# Patient Record
Sex: Female | Born: 1950 | Race: Black or African American | Hispanic: No | Smoking: Former smoker
Health system: Southern US, Community
[De-identification: ages and names within clinical notes are randomized; demographics above are authoritative.]

## PROBLEM LIST (undated history)

## (undated) DIAGNOSIS — E785 Hyperlipidemia, unspecified: Secondary | ICD-10-CM

## (undated) DIAGNOSIS — I10 Essential (primary) hypertension: Secondary | ICD-10-CM

## (undated) DIAGNOSIS — R7302 Impaired glucose tolerance (oral): Secondary | ICD-10-CM

## (undated) DIAGNOSIS — I722 Aneurysm of renal artery: Secondary | ICD-10-CM

## (undated) DIAGNOSIS — T7840XA Allergy, unspecified, initial encounter: Secondary | ICD-10-CM

## (undated) DIAGNOSIS — K5731 Diverticulosis of large intestine without perforation or abscess with bleeding: Secondary | ICD-10-CM

## (undated) DIAGNOSIS — O2493 Unspecified diabetes mellitus in the puerperium: Secondary | ICD-10-CM

## (undated) DIAGNOSIS — K439 Ventral hernia without obstruction or gangrene: Secondary | ICD-10-CM

## (undated) DIAGNOSIS — F329 Major depressive disorder, single episode, unspecified: Secondary | ICD-10-CM

## (undated) DIAGNOSIS — J449 Chronic obstructive pulmonary disease, unspecified: Secondary | ICD-10-CM

## (undated) DIAGNOSIS — D571 Sickle-cell disease without crisis: Secondary | ICD-10-CM

## (undated) DIAGNOSIS — E1165 Type 2 diabetes mellitus with hyperglycemia: Secondary | ICD-10-CM

## (undated) DIAGNOSIS — I2510 Atherosclerotic heart disease of native coronary artery without angina pectoris: Secondary | ICD-10-CM

## (undated) DIAGNOSIS — T783XXA Angioneurotic edema, initial encounter: Secondary | ICD-10-CM

## (undated) DIAGNOSIS — J45909 Unspecified asthma, uncomplicated: Secondary | ICD-10-CM

## (undated) DIAGNOSIS — E669 Obesity, unspecified: Secondary | ICD-10-CM

## (undated) DIAGNOSIS — I447 Left bundle-branch block, unspecified: Secondary | ICD-10-CM

## (undated) DIAGNOSIS — I442 Atrioventricular block, complete: Secondary | ICD-10-CM

## (undated) DIAGNOSIS — I5032 Chronic diastolic (congestive) heart failure: Secondary | ICD-10-CM

## (undated) DIAGNOSIS — F411 Generalized anxiety disorder: Secondary | ICD-10-CM

## (undated) HISTORY — DX: Atherosclerotic heart disease of native coronary artery without angina pectoris: I25.10

## (undated) HISTORY — DX: Generalized anxiety disorder: F41.1

## (undated) HISTORY — DX: Diverticulosis of large intestine without perforation or abscess with bleeding: K57.31

## (undated) HISTORY — DX: Ventral hernia without obstruction or gangrene: K43.9

## (undated) HISTORY — DX: Sickle-cell disease without crisis: D57.1

## (undated) HISTORY — DX: Aneurysm of renal artery: I72.2

## (undated) HISTORY — DX: Unspecified asthma, uncomplicated: J45.909

## (undated) HISTORY — DX: Impaired glucose tolerance (oral): R73.02

## (undated) HISTORY — DX: Obesity, unspecified: E66.9

## (undated) HISTORY — PX: CARDIAC SURGERY: SHX584

## (undated) HISTORY — DX: Left bundle-branch block, unspecified: I44.7

## (undated) HISTORY — DX: Angioneurotic edema, initial encounter: T78.3XXA

## (undated) HISTORY — DX: Hyperlipidemia, unspecified: E78.5

## (undated) HISTORY — DX: Chronic obstructive pulmonary disease, unspecified: J44.9

## (undated) HISTORY — DX: Allergy, unspecified, initial encounter: T78.40XA

## (undated) HISTORY — DX: Unspecified diabetes mellitus in the puerperium: O24.93

## (undated) HISTORY — PX: MYOMECTOMY: SHX85

## (undated) HISTORY — DX: Essential (primary) hypertension: I10

## (undated) HISTORY — DX: Major depressive disorder, single episode, unspecified: F32.9

## (undated) HISTORY — DX: Type 2 diabetes mellitus with hyperglycemia: E11.65

---

## 1985-01-24 HISTORY — PX: ABDOMINAL HYSTERECTOMY: SHX81

## 2001-01-13 ENCOUNTER — Encounter: Payer: Self-pay | Admitting: Emergency Medicine

## 2001-01-13 ENCOUNTER — Inpatient Hospital Stay (HOSPITAL_COMMUNITY): Admission: EM | Admit: 2001-01-13 | Discharge: 2001-01-15 | Payer: Self-pay | Admitting: Emergency Medicine

## 2001-01-14 ENCOUNTER — Encounter: Payer: Self-pay | Admitting: Internal Medicine

## 2003-03-30 ENCOUNTER — Emergency Department (HOSPITAL_COMMUNITY): Admission: EM | Admit: 2003-03-30 | Discharge: 2003-03-30 | Payer: Self-pay | Admitting: Emergency Medicine

## 2003-09-30 ENCOUNTER — Emergency Department (HOSPITAL_COMMUNITY): Admission: EM | Admit: 2003-09-30 | Discharge: 2003-10-01 | Payer: Self-pay | Admitting: Emergency Medicine

## 2004-02-06 ENCOUNTER — Ambulatory Visit: Payer: Self-pay | Admitting: Internal Medicine

## 2004-07-05 ENCOUNTER — Ambulatory Visit: Payer: Self-pay | Admitting: Internal Medicine

## 2004-07-06 ENCOUNTER — Ambulatory Visit: Payer: Self-pay | Admitting: Internal Medicine

## 2004-07-22 ENCOUNTER — Ambulatory Visit: Payer: Self-pay | Admitting: Family Medicine

## 2004-07-29 ENCOUNTER — Encounter: Admission: RE | Admit: 2004-07-29 | Discharge: 2004-07-29 | Payer: Self-pay | Admitting: Internal Medicine

## 2004-08-02 ENCOUNTER — Ambulatory Visit: Payer: Self-pay

## 2005-03-09 ENCOUNTER — Emergency Department (HOSPITAL_COMMUNITY): Admission: EM | Admit: 2005-03-09 | Discharge: 2005-03-09 | Payer: Self-pay | Admitting: Emergency Medicine

## 2005-05-27 ENCOUNTER — Ambulatory Visit: Payer: Self-pay | Admitting: Internal Medicine

## 2005-09-28 ENCOUNTER — Ambulatory Visit: Payer: Self-pay | Admitting: Internal Medicine

## 2005-09-29 ENCOUNTER — Ambulatory Visit: Payer: Self-pay | Admitting: Internal Medicine

## 2006-01-18 ENCOUNTER — Ambulatory Visit: Payer: Self-pay | Admitting: Internal Medicine

## 2006-02-25 ENCOUNTER — Encounter: Admission: RE | Admit: 2006-02-25 | Discharge: 2006-02-25 | Payer: Self-pay | Admitting: General Surgery

## 2006-03-22 ENCOUNTER — Ambulatory Visit: Payer: Self-pay | Admitting: Internal Medicine

## 2006-03-22 LAB — CONVERTED CEMR LAB
BUN: 8 mg/dL (ref 6–23)
CO2: 30 meq/L (ref 19–32)
Calcium: 9.6 mg/dL (ref 8.4–10.5)
Chloride: 103 meq/L (ref 96–112)
Cholesterol: 213 mg/dL (ref 0–200)
Creatinine, Ser: 0.6 mg/dL (ref 0.4–1.2)
Direct LDL: 112.4 mg/dL
GFR calc Af Amer: 134 mL/min
GFR calc non Af Amer: 111 mL/min
Glucose, Bld: 173 mg/dL — ABNORMAL HIGH (ref 70–99)
HDL: 39.3 mg/dL (ref >39.0)
Hgb A1c MFr Bld: 6.2 % — ABNORMAL HIGH (ref 4.6–6.0)
Potassium: 3.5 meq/L (ref 3.5–5.1)
Sodium: 141 meq/L (ref 135–145)
Total CHOL/HDL Ratio: 5.4
Triglycerides: 474 mg/dL (ref 0–149)
VLDL: 95 mg/dL — ABNORMAL HIGH (ref 0–40)

## 2006-03-30 ENCOUNTER — Ambulatory Visit: Payer: Self-pay | Admitting: Cardiology

## 2006-04-12 ENCOUNTER — Emergency Department (HOSPITAL_COMMUNITY): Admission: EM | Admit: 2006-04-12 | Discharge: 2006-04-12 | Payer: Self-pay | Admitting: Emergency Medicine

## 2006-05-16 ENCOUNTER — Encounter: Payer: Self-pay | Admitting: Cardiology

## 2006-05-16 ENCOUNTER — Ambulatory Visit: Payer: Self-pay

## 2006-06-25 HISTORY — PX: VENTRAL HERNIA REPAIR: SHX424

## 2006-07-12 ENCOUNTER — Ambulatory Visit (HOSPITAL_COMMUNITY): Admission: RE | Admit: 2006-07-12 | Discharge: 2006-07-12 | Payer: Self-pay | Admitting: General Surgery

## 2006-08-01 DIAGNOSIS — D571 Sickle-cell disease without crisis: Secondary | ICD-10-CM

## 2006-08-01 DIAGNOSIS — I10 Essential (primary) hypertension: Secondary | ICD-10-CM

## 2006-08-01 DIAGNOSIS — J45909 Unspecified asthma, uncomplicated: Secondary | ICD-10-CM | POA: Insufficient documentation

## 2006-08-01 DIAGNOSIS — E785 Hyperlipidemia, unspecified: Secondary | ICD-10-CM

## 2006-08-01 DIAGNOSIS — I421 Obstructive hypertrophic cardiomyopathy: Secondary | ICD-10-CM | POA: Insufficient documentation

## 2006-08-01 HISTORY — DX: Hyperlipidemia, unspecified: E78.5

## 2006-08-01 HISTORY — DX: Sickle-cell disease without crisis: D57.1

## 2006-08-01 HISTORY — DX: Unspecified asthma, uncomplicated: J45.909

## 2006-08-01 HISTORY — DX: Essential (primary) hypertension: I10

## 2006-09-16 DIAGNOSIS — F3289 Other specified depressive episodes: Secondary | ICD-10-CM | POA: Insufficient documentation

## 2006-09-16 DIAGNOSIS — F411 Generalized anxiety disorder: Secondary | ICD-10-CM | POA: Insufficient documentation

## 2006-09-16 DIAGNOSIS — E669 Obesity, unspecified: Secondary | ICD-10-CM | POA: Insufficient documentation

## 2006-09-16 DIAGNOSIS — F329 Major depressive disorder, single episode, unspecified: Secondary | ICD-10-CM

## 2006-09-16 HISTORY — DX: Obesity, unspecified: E66.9

## 2006-09-16 HISTORY — DX: Other specified depressive episodes: F32.89

## 2006-09-16 HISTORY — DX: Generalized anxiety disorder: F41.1

## 2006-09-16 HISTORY — DX: Major depressive disorder, single episode, unspecified: F32.9

## 2006-12-06 ENCOUNTER — Telehealth (INDEPENDENT_AMBULATORY_CARE_PROVIDER_SITE_OTHER): Payer: Self-pay | Admitting: *Deleted

## 2006-12-07 ENCOUNTER — Ambulatory Visit: Payer: Self-pay | Admitting: Internal Medicine

## 2006-12-07 DIAGNOSIS — Z860100 Personal history of colon polyps, unspecified: Secondary | ICD-10-CM | POA: Insufficient documentation

## 2006-12-07 DIAGNOSIS — J019 Acute sinusitis, unspecified: Secondary | ICD-10-CM | POA: Insufficient documentation

## 2006-12-07 DIAGNOSIS — K439 Ventral hernia without obstruction or gangrene: Secondary | ICD-10-CM

## 2006-12-07 DIAGNOSIS — J309 Allergic rhinitis, unspecified: Secondary | ICD-10-CM | POA: Insufficient documentation

## 2006-12-07 HISTORY — DX: Ventral hernia without obstruction or gangrene: K43.9

## 2007-01-10 ENCOUNTER — Telehealth (INDEPENDENT_AMBULATORY_CARE_PROVIDER_SITE_OTHER): Payer: Self-pay | Admitting: *Deleted

## 2007-01-11 ENCOUNTER — Ambulatory Visit: Payer: Self-pay | Admitting: Internal Medicine

## 2007-01-11 DIAGNOSIS — B370 Candidal stomatitis: Secondary | ICD-10-CM | POA: Insufficient documentation

## 2007-01-19 ENCOUNTER — Encounter: Payer: Self-pay | Admitting: Internal Medicine

## 2007-01-19 ENCOUNTER — Ambulatory Visit: Payer: Self-pay | Admitting: Internal Medicine

## 2007-02-20 ENCOUNTER — Ambulatory Visit: Payer: Self-pay | Admitting: Internal Medicine

## 2007-02-20 ENCOUNTER — Encounter: Payer: Self-pay | Admitting: Internal Medicine

## 2007-02-20 LAB — HM COLONOSCOPY

## 2007-03-12 ENCOUNTER — Encounter: Payer: Self-pay | Admitting: Internal Medicine

## 2007-10-08 ENCOUNTER — Ambulatory Visit: Payer: Self-pay | Admitting: Cardiology

## 2007-11-12 ENCOUNTER — Telehealth: Payer: Self-pay | Admitting: Internal Medicine

## 2007-11-12 ENCOUNTER — Ambulatory Visit: Payer: Self-pay | Admitting: Internal Medicine

## 2007-11-12 ENCOUNTER — Telehealth (INDEPENDENT_AMBULATORY_CARE_PROVIDER_SITE_OTHER): Payer: Self-pay | Admitting: *Deleted

## 2007-11-26 ENCOUNTER — Telehealth (INDEPENDENT_AMBULATORY_CARE_PROVIDER_SITE_OTHER): Payer: Self-pay | Admitting: *Deleted

## 2007-12-10 ENCOUNTER — Telehealth (INDEPENDENT_AMBULATORY_CARE_PROVIDER_SITE_OTHER): Payer: Self-pay | Admitting: *Deleted

## 2007-12-10 DIAGNOSIS — R519 Headache, unspecified: Secondary | ICD-10-CM | POA: Insufficient documentation

## 2007-12-10 DIAGNOSIS — R51 Headache: Secondary | ICD-10-CM | POA: Insufficient documentation

## 2007-12-18 ENCOUNTER — Encounter: Payer: Self-pay | Admitting: Internal Medicine

## 2008-02-06 ENCOUNTER — Encounter: Payer: Self-pay | Admitting: Pulmonary Disease

## 2008-03-01 ENCOUNTER — Emergency Department (HOSPITAL_COMMUNITY): Admission: EM | Admit: 2008-03-01 | Discharge: 2008-03-02 | Payer: Self-pay | Admitting: Emergency Medicine

## 2008-03-05 ENCOUNTER — Telehealth (INDEPENDENT_AMBULATORY_CARE_PROVIDER_SITE_OTHER): Payer: Self-pay | Admitting: *Deleted

## 2008-03-07 ENCOUNTER — Ambulatory Visit: Payer: Self-pay | Admitting: Internal Medicine

## 2008-03-07 ENCOUNTER — Encounter: Payer: Self-pay | Admitting: Pulmonary Disease

## 2008-03-07 DIAGNOSIS — T783XXA Angioneurotic edema, initial encounter: Secondary | ICD-10-CM

## 2008-03-07 HISTORY — DX: Angioneurotic edema, initial encounter: T78.3XXA

## 2008-03-11 ENCOUNTER — Ambulatory Visit: Payer: Self-pay | Admitting: Pulmonary Disease

## 2008-03-11 DIAGNOSIS — R0902 Hypoxemia: Secondary | ICD-10-CM | POA: Insufficient documentation

## 2008-03-13 ENCOUNTER — Telehealth (INDEPENDENT_AMBULATORY_CARE_PROVIDER_SITE_OTHER): Payer: Self-pay | Admitting: *Deleted

## 2008-03-14 ENCOUNTER — Telehealth (INDEPENDENT_AMBULATORY_CARE_PROVIDER_SITE_OTHER): Payer: Self-pay | Admitting: *Deleted

## 2008-03-20 ENCOUNTER — Encounter: Payer: Self-pay | Admitting: Pulmonary Disease

## 2008-05-17 ENCOUNTER — Encounter: Payer: Self-pay | Admitting: Pulmonary Disease

## 2008-07-18 ENCOUNTER — Ambulatory Visit: Payer: Self-pay | Admitting: Endocrinology

## 2008-10-14 ENCOUNTER — Encounter: Payer: Self-pay | Admitting: Internal Medicine

## 2008-12-10 ENCOUNTER — Ambulatory Visit: Payer: Self-pay | Admitting: Internal Medicine

## 2008-12-10 DIAGNOSIS — M25519 Pain in unspecified shoulder: Secondary | ICD-10-CM | POA: Insufficient documentation

## 2008-12-23 ENCOUNTER — Telehealth: Payer: Self-pay | Admitting: Internal Medicine

## 2008-12-25 ENCOUNTER — Encounter: Payer: Self-pay | Admitting: Internal Medicine

## 2009-01-15 ENCOUNTER — Telehealth: Payer: Self-pay | Admitting: Internal Medicine

## 2009-08-31 ENCOUNTER — Ambulatory Visit: Payer: Self-pay | Admitting: Internal Medicine

## 2009-08-31 DIAGNOSIS — T2200XA Burn of unspecified degree of shoulder and upper limb, except wrist and hand, unspecified site, initial encounter: Secondary | ICD-10-CM | POA: Insufficient documentation

## 2009-09-02 LAB — CONVERTED CEMR LAB
ALT: 39 units/L — ABNORMAL HIGH (ref 0–35)
AST: 22 units/L (ref 0–37)
Albumin: 4.1 g/dL (ref 3.5–5.2)
Alkaline Phosphatase: 89 units/L (ref 39–117)
BUN: 15 mg/dL (ref 6–23)
Basophils Absolute: 0 10*3/uL (ref 0.0–0.1)
Basophils Relative: 0.6 % (ref 0.0–3.0)
Bilirubin Urine: NEGATIVE
Bilirubin, Direct: 0.1 mg/dL (ref 0.0–0.3)
CO2: 28 meq/L (ref 19–32)
Calcium: 9.2 mg/dL (ref 8.4–10.5)
Chloride: 110 meq/L (ref 96–112)
Cholesterol: 192 mg/dL (ref 0–200)
Creatinine, Ser: 0.9 mg/dL (ref 0.4–1.2)
Direct LDL: 118.5 mg/dL
Eosinophils Absolute: 0.2 10*3/uL (ref 0.0–0.7)
Eosinophils Relative: 3.1 % (ref 0.0–5.0)
Folate: 4.8 ng/mL
GFR calc non Af Amer: 88.32 mL/min (ref >60)
Glucose, Bld: 83 mg/dL (ref 70–99)
HCT: 35.4 % — ABNORMAL LOW (ref 36.0–46.0)
HDL: 37.4 mg/dL — ABNORMAL LOW (ref >39.00)
Hemoglobin, Urine: NEGATIVE
Hemoglobin: 11.8 g/dL — ABNORMAL LOW (ref 12.0–15.0)
Iron: 50 ug/dL (ref 42–145)
Ketones, ur: NEGATIVE mg/dL
Leukocytes, UA: NEGATIVE
Lymphocytes Relative: 26.9 % (ref 12.0–46.0)
Lymphs Abs: 1.8 10*3/uL (ref 0.7–4.0)
MCHC: 33.3 g/dL (ref 30.0–36.0)
MCV: 77.1 fL — ABNORMAL LOW (ref 78.0–100.0)
Monocytes Absolute: 0.4 10*3/uL (ref 0.1–1.0)
Monocytes Relative: 5.8 % (ref 3.0–12.0)
Neutro Abs: 4.3 10*3/uL (ref 1.4–7.7)
Neutrophils Relative %: 63.6 % (ref 43.0–77.0)
Nitrite: NEGATIVE
Platelets: 202 10*3/uL (ref 150.0–400.0)
Potassium: 4.5 meq/L (ref 3.5–5.1)
Pro B Natriuretic peptide (BNP): 460.4 pg/mL — ABNORMAL HIGH (ref 0.0–100.0)
RBC: 4.59 M/uL (ref 3.87–5.11)
RDW: 17.2 % — ABNORMAL HIGH (ref 11.5–14.6)
Saturation Ratios: 15.4 % — ABNORMAL LOW (ref 20.0–50.0)
Sodium: 145 meq/L (ref 135–145)
Specific Gravity, Urine: 1.02 (ref 1.000–1.030)
TSH: 0.74 microintl units/mL (ref 0.35–5.50)
Total Bilirubin: 0.3 mg/dL (ref 0.3–1.2)
Total CHOL/HDL Ratio: 5
Total Protein, Urine: NEGATIVE mg/dL
Total Protein: 6.7 g/dL (ref 6.0–8.3)
Transferrin: 231.7 mg/dL (ref 212.0–360.0)
Triglycerides: 288 mg/dL — ABNORMAL HIGH (ref 0.0–149.0)
Urine Glucose: NEGATIVE mg/dL
Urobilinogen, UA: 0.2 (ref 0.0–1.0)
VLDL: 57.6 mg/dL — ABNORMAL HIGH (ref 0.0–40.0)
Vitamin B-12: 343 pg/mL (ref 211–911)
WBC: 6.7 10*3/uL (ref 4.5–10.5)
pH: 5.5 (ref 5.0–8.0)

## 2009-10-05 ENCOUNTER — Inpatient Hospital Stay (HOSPITAL_COMMUNITY): Admission: EM | Admit: 2009-10-05 | Discharge: 2009-10-07 | Payer: Self-pay | Admitting: Emergency Medicine

## 2009-10-05 ENCOUNTER — Ambulatory Visit: Payer: Self-pay | Admitting: Cardiology

## 2009-10-05 ENCOUNTER — Ambulatory Visit: Payer: Self-pay | Admitting: Gastroenterology

## 2009-10-05 ENCOUNTER — Telehealth: Payer: Self-pay | Admitting: Internal Medicine

## 2009-10-06 ENCOUNTER — Encounter (INDEPENDENT_AMBULATORY_CARE_PROVIDER_SITE_OTHER): Payer: Self-pay | Admitting: Internal Medicine

## 2009-10-13 ENCOUNTER — Ambulatory Visit: Payer: Self-pay | Admitting: Internal Medicine

## 2009-10-13 DIAGNOSIS — I498 Other specified cardiac arrhythmias: Secondary | ICD-10-CM | POA: Insufficient documentation

## 2009-10-13 DIAGNOSIS — K922 Gastrointestinal hemorrhage, unspecified: Secondary | ICD-10-CM | POA: Insufficient documentation

## 2009-10-14 ENCOUNTER — Encounter (INDEPENDENT_AMBULATORY_CARE_PROVIDER_SITE_OTHER): Payer: Self-pay | Admitting: *Deleted

## 2009-10-14 ENCOUNTER — Telehealth: Payer: Self-pay | Admitting: Internal Medicine

## 2009-10-14 LAB — CONVERTED CEMR LAB
Basophils Absolute: 0 10*3/uL (ref 0.0–0.1)
Basophils Relative: 0.7 % (ref 0.0–3.0)
Eosinophils Absolute: 0.4 10*3/uL (ref 0.0–0.7)
Eosinophils Relative: 6.8 % — ABNORMAL HIGH (ref 0.0–5.0)
HCT: 32.3 % — ABNORMAL LOW (ref 36.0–46.0)
Hemoglobin: 10.7 g/dL — ABNORMAL LOW (ref 12.0–15.0)
Lymphocytes Relative: 25.5 % (ref 12.0–46.0)
Lymphs Abs: 1.7 10*3/uL (ref 0.7–4.0)
MCHC: 33.1 g/dL (ref 30.0–36.0)
MCV: 77.1 fL — ABNORMAL LOW (ref 78.0–100.0)
Monocytes Absolute: 0.3 10*3/uL (ref 0.1–1.0)
Monocytes Relative: 5 % (ref 3.0–12.0)
Neutro Abs: 4.1 10*3/uL (ref 1.4–7.7)
Neutrophils Relative %: 62 % (ref 43.0–77.0)
Platelets: 168 10*3/uL (ref 150.0–400.0)
RBC: 4.2 M/uL (ref 3.87–5.11)
RDW: 17.3 % — ABNORMAL HIGH (ref 11.5–14.6)
WBC: 6.5 10*3/uL (ref 4.5–10.5)

## 2009-10-20 ENCOUNTER — Ambulatory Visit: Payer: Self-pay | Admitting: Cardiology

## 2009-11-17 ENCOUNTER — Ambulatory Visit: Payer: Self-pay

## 2009-11-17 ENCOUNTER — Ambulatory Visit: Payer: Self-pay | Admitting: Cardiology

## 2009-12-05 ENCOUNTER — Ambulatory Visit: Payer: Self-pay | Admitting: Family Medicine

## 2009-12-15 ENCOUNTER — Encounter: Payer: Self-pay | Admitting: Internal Medicine

## 2009-12-21 ENCOUNTER — Ambulatory Visit: Payer: Self-pay | Admitting: Internal Medicine

## 2009-12-22 ENCOUNTER — Telehealth: Payer: Self-pay | Admitting: Internal Medicine

## 2009-12-25 ENCOUNTER — Telehealth: Payer: Self-pay | Admitting: Internal Medicine

## 2010-01-14 ENCOUNTER — Ambulatory Visit: Payer: Self-pay | Admitting: Internal Medicine

## 2010-01-14 DIAGNOSIS — K140 Glossitis: Secondary | ICD-10-CM | POA: Insufficient documentation

## 2010-01-25 ENCOUNTER — Inpatient Hospital Stay (HOSPITAL_COMMUNITY)
Admission: EM | Admit: 2010-01-25 | Discharge: 2010-01-28 | Payer: Self-pay | Source: Home / Self Care | Attending: Internal Medicine | Admitting: Internal Medicine

## 2010-01-27 ENCOUNTER — Encounter (INDEPENDENT_AMBULATORY_CARE_PROVIDER_SITE_OTHER): Payer: Self-pay | Admitting: Internal Medicine

## 2010-01-27 LAB — BASIC METABOLIC PANEL
BUN: 11 mg/dL (ref 6–23)
CO2: 25 mEq/L (ref 19–32)
Calcium: 9.3 mg/dL (ref 8.4–10.5)
Chloride: 111 mEq/L (ref 96–112)
Creatinine, Ser: 0.72 mg/dL (ref 0.4–1.2)
GFR calc Af Amer: >60 mL/min (ref >60)
GFR calc non Af Amer: >60 mL/min (ref >60)
Glucose, Bld: 120 mg/dL — ABNORMAL HIGH (ref 70–99)
Potassium: 4.1 mEq/L (ref 3.5–5.1)
Sodium: 141 mEq/L (ref 135–145)

## 2010-01-27 LAB — CBC
HCT: 40.7 % (ref 36.0–46.0)
Hemoglobin: 12.9 g/dL (ref 12.0–15.0)
MCH: 24.1 pg — ABNORMAL LOW (ref 26.0–34.0)
MCHC: 31.7 g/dL (ref 30.0–36.0)
MCV: 76.1 fL — ABNORMAL LOW (ref 78.0–100.0)
Platelets: 199 10*3/uL (ref 150–400)
RBC: 5.35 MIL/uL — ABNORMAL HIGH (ref 3.87–5.11)
RDW: 16.5 % — ABNORMAL HIGH (ref 11.5–15.5)
WBC: 8.3 10*3/uL (ref 4.0–10.5)

## 2010-01-28 LAB — BASIC METABOLIC PANEL
BUN: 13 mg/dL (ref 6–23)
CO2: 26 mEq/L (ref 19–32)
Calcium: 9.1 mg/dL (ref 8.4–10.5)
Chloride: 111 mEq/L (ref 96–112)
Creatinine, Ser: 0.96 mg/dL (ref 0.4–1.2)
GFR calc Af Amer: >60 mL/min (ref >60)
GFR calc non Af Amer: 60 mL/min — ABNORMAL LOW (ref >60)
Glucose, Bld: 107 mg/dL — ABNORMAL HIGH (ref 70–99)
Potassium: 3.9 mEq/L (ref 3.5–5.1)
Sodium: 141 mEq/L (ref 135–145)

## 2010-01-28 LAB — CBC
HCT: 38 % (ref 36.0–46.0)
Hemoglobin: 12.1 g/dL (ref 12.0–15.0)
MCH: 24.3 pg — ABNORMAL LOW (ref 26.0–34.0)
MCHC: 31.8 g/dL (ref 30.0–36.0)
MCV: 76.3 fL — ABNORMAL LOW (ref 78.0–100.0)
Platelets: 174 10*3/uL (ref 150–400)
RBC: 4.98 MIL/uL (ref 3.87–5.11)
RDW: 16.3 % — ABNORMAL HIGH (ref 11.5–15.5)
WBC: 8 10*3/uL (ref 4.0–10.5)

## 2010-02-09 ENCOUNTER — Encounter: Payer: Self-pay | Admitting: Cardiology

## 2010-02-11 ENCOUNTER — Ambulatory Visit
Admission: RE | Admit: 2010-02-11 | Discharge: 2010-02-11 | Payer: Self-pay | Source: Home / Self Care | Attending: Cardiology | Admitting: Cardiology

## 2010-02-11 ENCOUNTER — Encounter: Payer: Self-pay | Admitting: Cardiology

## 2010-02-12 NOTE — Discharge Summary (Signed)
NAME:  Stacey [name], Stacey [name]   ACCOUNT NO.:  [id number]  MEDICAL RECORD NO.:  [id number]          PATIENT TYPE:  INP  LOCATION:  [id number]                         FACILITY:  [facility]  PHYSICIAN:  Sarajane Jews, MD     DATE OF BIRTH:  17-Jan-1951  DATE OF ADMISSION:  01/25/2010 DATE OF DISCHARGE:  01/28/2010                              DISCHARGE SUMMARY   PRIMARY CARE PHYSICIAN:  Biagio Borg, MD  PRIMARY CARDIOLOGIST:  Minus Breeding, MD, [facility]  Time spent on the discharge summary was 35 minutes.  REASON FOR ADMISSION:  Syncope.  DISCHARGE DIAGNOSES: 1. Hypertrophic obstructive cardiomyopathy. 2. Syncope secondary to hypertrophic obstructive cardiomyopathy. 3. Hypertension. 4. History of diverticulosis and diverticulitis. 5. History of cholecystitis. 6. History of sinusitis. 7. Chronic diastolic congestive heart failure. 8. Obesity. 9. Asthma. 10.History of sickle cell anemia. 11.Hyperlipidemia. 12.History of bradycardia.  MEDICATION ON DISCHARGE: 1. Benazepril 40 mg p.o. daily. 2. Gabapentin 300 mg t.i.d. 3. Topamax 200 mg one and half tablet daily. 4. Toprol-XL 150 mg daily. 5. Vitamin B12 500 mcg p.o. daily. 6. Vitamin D 400 units p.o. daily. 7. Aspirin 325 daily. 8. Simvastatin daily.  HOSPITAL COURSE:  Stacey [name] is a 60 year old female with history of hypertrophic obstructive cardiomyopathy, but not coronary artery disease.  Yesterday, before admission she came because she did pass out after feeling diaphoretic.  The patient's symptom improved and she increased her activity somewhat, and she did okay until 2:15 p.m. on the day of admission when she felt again lightheaded.  She developed chest pressure and jaw tightness that was sudden onset and she stated that was greater than 10.  Husband called EMS and the patient was brought to the emergency room.  She had a CT of the head, which showed no intracranial abnormalities.  The patient was ruled out for  acute coronary syndrome.  The patient was consulted by Dr. Percival Spanish, her primary care cardiologist who arranged for cardiac cath.  The patient underwent cardiac cath on January 4, which showed mild nonobstructive coronary artery disease, hypertrophic obstructive cardiomyopathy, preserved left ventricular systolic function.  Recommendation is to continue the medical management at this time and patient is going to follow up with Dr. Percival Spanish for further plan for evaluation at [facility] for possible alcohol septal ablation.  The patient's vital on discharge:  Temperature is 98, pulse is 50, respiration is 18, blood pressure is 140/70, and she is satting 94% on room air.  CONSULT THAT WAS DONE:  Cardiology consult was done with Dr. Percival Spanish.  RADIOLOGY THAT WAS DONE:  Chest x-ray was done, which showed pulmonary vascular congestion without edema.  CT of the head was done, which showed no acute intracranial abnormality.  Cardiac cath was done, which showed mild nonobstructive coronary artery disease, hypertrophic obstructive cardiomyopathy, preserved left ventricular systolic function.  LABS:  WBC is 8, hemoglobin is 12, platelet is 174.  Her sodium is 141, potassium is 3.9, chloride is 111, bicarb is 26, glucose 107, BUN is 13, creatinine is 0.9.  DISPOSITION:  Home.  FOLLOWUP RECOMMENDATION:  Follow up with cardiology Dr. Percival Spanish for further management of her specific coronary artery disease.  DIET:  2-g sodium.  ______________________________ Sarajane Jews, MD     SA/MEDQ  D:  01/28/2010  T:  01/28/2010  Job:  [id number]  cc:   Minus Breeding, MD, [facility] Biagio Borg, MD  Electronically Signed by Sarajane Jews MD on 02/12/2010 11:48:05 AM

## 2010-02-14 ENCOUNTER — Encounter: Payer: Self-pay | Admitting: General Surgery

## 2010-02-18 ENCOUNTER — Ambulatory Visit
Admission: RE | Admit: 2010-02-18 | Discharge: 2010-02-18 | Payer: Self-pay | Source: Home / Self Care | Attending: Internal Medicine | Admitting: Internal Medicine

## 2010-02-18 DIAGNOSIS — S301XXA Contusion of abdominal wall, initial encounter: Secondary | ICD-10-CM | POA: Insufficient documentation

## 2010-02-23 NOTE — Letter (Signed)
Summary: Out of Work  [facility]  [address] [place], [place] [address]   Phone: [phone]  Fax: [phone]    October 13, 2009   Employee:  Stacey [name]    To Whom It May Concern:   For Medical reasons, please excuse the above named employee from work for the following dates:  Start:   sept 12, 2011  End:   sept 15, 2011   ----   to return to work sept 16, 2011 without restriction  If you need additional information, please feel free to contact our office.         Sincerely,    [name] MD

## 2010-02-23 NOTE — Progress Notes (Signed)
Summary: referral  Phone Note Call from Patient Call back at Home Phone [phone]   Caller: Patient Call For: [name] MD Summary of Call: Pt has decided she wants a referral for a orthopedic for her shoulder. Initial call taken by: Verdell Face,  December 23, 2008 12:44 PM  Follow-up for Phone Call        ok - I will do Follow-up by: [name] MD,  December 23, 2008 1:25 PM  Additional Follow-up for Phone Call Additional follow up Details #1::        pt informed Additional Follow-up by: Margaret Pyle, CMA,  December 23, 2008 1:31 PM

## 2010-02-23 NOTE — Progress Notes (Signed)
Summary: Call Report  Phone Note Other Incoming   Caller: [facility] Summary of Call: [facility] Triage Call Report Triage Record Num: [id number] Operator: Marvell Fuller Patient Name: Stacey [name] Call Date & Time: 12/21/2009 7:33:41AM Patient Phone: [phone] PCP: Oliver Barre Patient Gender: Female PCP Fax : [phone] Patient DOB: 09/30/1950 Practice Name: [facility] Reason for Call: Patient calling 12/21/2009 with eye swelling bilateral, watery eyes, Patient indicates allergies and this always happens in spring/ fall. Low grade - tactile febrile(no thermometer)Onset 12/19/2009. OTC Benadryl x 3 over weekend. Advised appt by 12pm today 12/21/2009. Patient verbalizd understanding of callback parmeters. Protocol(s) Used: Eye: Infection or Irritation Recommended Outcome per Protocol: See Provider within 24 hours Reason for Outcome: Generalized swelling of eyelid(s) Care Advice:  ~ Another adult should drive if vision is impaired.  ~ Apply a warm compress to the eye(s) four times a day for 10 to 15 minutes. Gently brush closed eyelid(s) with a cotton ball and diluted baby shampoo and water (3-5 drops of shampoo in 4 oz. of water) three or four times a day.  ~  ~ Don't use eye makeup or wear contact lenses until there have been no symptoms for at least 24 hours.  ~ SYMPTOM / CONDITION MANAGEMENT 12/21/2009 7:47:28AM Page 1 of 1 [id number] Initial call taken by: Margaret Pyle, CMA,  December 22, 2009 9:20 AM

## 2010-02-23 NOTE — Progress Notes (Signed)
Summary: ER  Phone Note Call from Patient Call back at Home Phone [phone]   Caller: Patient Summary of Call: Pt called stating that she has BRB in commode after BM (several this morning) and severe abd pain not always with BM. Pt requested appt with JWJ this morning for eval. I returned pt's call and advised ER now due to sxs and family history. I informed pt that JWJ does not have appt available until this afternoon and that she should be evaluated as soon as possible. Pt agreed and will go to ER. Initial call taken by: Margaret Pyle, CMA,  October 05, 2009 8:51 AM  Follow-up for Phone Call        noted  ok to consider other MD in the office if ok with pt in future Follow-up by: [name] MD,  October 05, 2009 8:54 AM

## 2010-02-23 NOTE — Assessment & Plan Note (Signed)
Summary: CONGESTION/NWS   Vital Signs:  Patient Profile:   61 Years Old Female Weight:      165.31 pounds Temp:     100.1 degrees F oral Pulse rate:   92 / minute BP sitting:   137 / 86  (right arm)  Vitals Entered By: Rock Nephew CMA (December 07, 2006 3:41 PM)                 Preventive Care Screening  Colonoscopy:    Next Due:  09/2006   Chief Complaint:  Congestion and head & ear ache x 3days.  History of Present Illness: here with several day URI symptoms and now facial pain, fever, greenish d/c  Current Allergies (reviewed today): ! CODEINE Updated/Current Medications (including changes made in today's visit):  BENAZEPRIL HCL 40 MG TABS (BENAZEPRIL HCL) Take 1 tablet by mouth once a day SINGULAIR 10 MG TABS (MONTELUKAST SODIUM) Take 1 tablet by mouth once a day ZYRTEC ALLERGY 10 MG TABS (CETIRIZINE HCL) Take 1 tablet by mouth once a day METOPROLOL SUCCINATE 100 MG  TB24 (METOPROLOL SUCCINATE) 1 and 1/2 by mouth qd CEFTIN 250 MG TABS (CEFUROXIME AXETIL) 1 by mouth bid   Past Medical History:    Reviewed history from 09/16/2006 and no changes required:       Hyperlipidemia       Hypertension       Sickle Cell Anemia       Hypertrophic Cardiomyopathy - Left Ventricle       Borderline Diabetes       Asthma       Obesity       Anxiety       Depression       Congestive heart failure       Colonic polyps, hx of       Allergic rhinitis  Past Surgical History:    Reviewed history from 08/01/2006 and no changes required:       Hysterectomy - 1987       s/p ventral hernia repair - 6/08   Family History:    Reviewed history and no changes required:       adopted - no hx available except:       daughter with colon cancer       daughter with bleeding disorder  Social History:    Reviewed history and no changes required:       Former Smoker       Alcohol use-no   Risk Factors:  Tobacco use:  quit Alcohol use:  no    Physical Exam  General:     mild ill appearing Head:     Normocephalic and atraumatic without obvious abnormalities. No apparent alopecia or balding. Eyes:     No corneal or conjunctival inflammation noted. EOMI. Perrla. Ears:     bilat tm's red Nose:     sinus tender bilatnasal dischargemucosal pallor.   Mouth:     pharyngeal exudate.   Neck:     cervical lymphadenopathy.   Lungs:     Normal respiratory effort, chest expands symmetrically. Lungs are clear to auscultation, no crackles or wheezes. Heart:     Normal rate and regular rhythm. S1 and S2 normal without gallop, murmur, click, rub or other extra sounds. Extremities:     no edema    Impression & Recommendations:  Problem # 1:  SINUSITIS- ACUTE-NOS (ICD-461.9)  Her updated medication list for this problem includes:    Ceftin 250 Mg Tabs (  Cefuroxime axetil) .Marland Kitchen... 1 by mouth bid   Problem # 2:  COLONIC POLYPS, HX OF (ICD-V12.72) overdue - will direct sched colonscopy Orders: Gastroenterology Referral (GI)   Complete Medication List: 1)  Benazepril Hcl 40 Mg Tabs (Benazepril hcl) .... Take 1 tablet by mouth once a day 2)  Singulair 10 Mg Tabs (Montelukast sodium) .... Take 1 tablet by mouth once a day 3)  Zyrtec Allergy 10 Mg Tabs (Cetirizine hcl) .... Take 1 tablet by mouth once a day 4)  Metoprolol Succinate 100 Mg Tb24 (Metoprolol succinate) .Marland Kitchen.. 1 and 1/2 by mouth qd 5)  Ceftin 250 Mg Tabs (Cefuroxime axetil) .Marland Kitchen.. 1 by mouth bid   Patient Instructions: 1)  Take antibiotic as prescribed for sinus and throat 2)  Can use tylenol and alleve for pain 3)  Please schedule a follow-up appointment in 3 months - for CPX with Labs prior (including HGBA1c - 790.2)    Prescriptions: CEFTIN 250 MG TABS (CEFUROXIME AXETIL) 1 by mouth bid  #20 x 0   Entered and Authorized by:   [name] MD   Signed by:   [name] MD on 12/07/2006   Method used:   Electronically sent to ...       [address]       [address]        [place], [place]  [address]       Ph: [phone]       Fax: [phone]   RxID:   [id number]  ]

## 2010-02-23 NOTE — Assessment & Plan Note (Signed)
Summary: consult for nocturnal hypoxemia   Referred by:  Dr Darrow Bussing PCP:  Oliver Barre, MD  Chief Complaint:  Sleep consult.  Reports of low oxygen when sleeps.  Pt reports sleeps proped up on pillows to promote air circulation.  Marland Kitchen  History of Present Illness: the pt is a 60 y/o female who I have been asked to see for nocturnal hypoxemia noted on a recent sleep study.  This was done on 02/06/08, and revealed no significant sleep disordered breathing.  She did have  desat as low as 72% transiently, and spent only 32 minutes total less than 89%.  The pt has no history of pulmonary disease except asthma that is only seasonal in nature and followed by Dr. Oxford Callas.  She does have a h/o hypertensive cardiomyopathy with asymmetric septal hypertrophy, and also sickle cell anemia.  Her last crisis was 2 yrs ago.  She denies any LE edema.  She denies any significant cough or mucus.  She has no sob with mild to moderate activities, but does get winded with heavy exertional activities.  Her last chest xray was many years ago.    Prior Medications Reviewed Using: Patient Recall  Updated Prior Medication List: BENAZEPRIL HCL 40 MG TABS (BENAZEPRIL HCL) once daily METOPROLOL SUCCINATE 100 MG  TB24 (METOPROLOL SUCCINATE) 1 and 1/2 by mouth qd HYDROCODONE-ACETAMINOPHEN 5-500 MG TABS (HYDROCODONE-ACETAMINOPHEN) as needed PREDNISONE 10 MG TABS (PREDNISONE) 4po qd for 3days, then 3po qd for 3days, then 2po qd for 3days, then 1po qd for 3 days, then stop BACLOFEN 10 MG TABS (BACLOFEN) Upto 4 times daily  Current Allergies (reviewed today): ! CODEINE ! ASA Past Medical History:    Reviewed history from 12/07/2006 and no changes required:       Hyperlipidemia       Hypertension       Sickle Cell Anemia       Hypertrophic Cardiomyopathy - Left Ventricle       Borderline Diabetes       Seasonal Asthma       Obesity       Anxiety       Depression       Congestive heart failure       Colonic polyps, hx  of       Allergic rhinitis  Past Surgical History:    Reviewed history from 12/07/2006 and no changes required:       Hysterectomy - 1987       s/p ventral hernia repair - 6/08  Family History:    Reviewed history from 12/07/2006 and no changes required:       adopted - no hx available except:       daughter with colon cancer       daughter with bleeding disorder  Social History:    Reviewed history from 01/19/2007 and no changes required:       Former Smoker.  Quit smoking 2001.  Smoked on and off x 20 years.  Smoked 2-3 ciag daily.         Alcohol use-no       Runner, broadcasting/film/video at [employer]   Risk Factors: Tobacco use:  quit    Year quit:  2000    Pack-years:  20 yrs 5 every day Alcohol use:  no  Colonoscopy History:    Date of Last Colonoscopy:  02/20/2007  Review of Systems      See HPI  Vital Signs:  Patient Profile:   60 Years  Old Female Height:     69 inches Weight:      165 pounds O2 Sat:      93 % O2 treatment:    Room Air Temp:     98.3 degrees F oral Pulse rate:   61 / minute BP sitting:   164 / 82  (left arm) Cuff size:   regular  Vitals Entered By: Cloyde Reams RN (March 11, 2008 4:06 PM)             Comments Pt is here today for a sleep consult.  Referred by Dr Annia Belt. Medications reviewed Cloyde Reams RN  March 11, 2008 4:06 PM     Physical Exam  General:     wd female in nad Eyes:     PERRLA and EOMI.   Nose:     patent without discharge. Mouth:     clear  Neck:     no jvd, tmg, LN Lungs:     clear to auscultation Heart:     rrr, 4/6 blowing systolic murmur. Abdomen:     soft and nontender, bs + Extremities:     no significant edema, pulses intact  Neurologic:     alert and oriented, moves all 4 extremities.   Impression & Recommendations:  Problem # 1:  HYPOXEMIA (ICD-799.02) the pt has nocturnal hypoxemia that I suspect is related to her hypertrophic CM and her sickle cell disease.  I really don't see any  obvious pulmonary disease that would be responsible for this finding, but will do a cxr for completeness.  There was no evidence for pulmonary htn by her echo in 2008.  With regards to oxygen need, the amount of time she spent below 88% was really quite small.  If she did not have sickle cell disease, I would have no reservations about not treating her nocturnal hypoxemia.  However, I explained to her that nocturnal hypoxia can increase her risk of sickle cell crisis, and perhaps worsen pulmonary htn.  I would like to recheck her overnight sats in her home environment, and then she and I will discuss again.  Medications Added to Medication List This Visit: 1)  Baclofen 10 Mg Tabs (Baclofen) .... Upto 4 times daily  Patient Instructions: 1)  will check a cxr today and call you with results 2)  will check your oxygen level at night at home.

## 2010-02-23 NOTE — Assessment & Plan Note (Signed)
Summary: ?sinus infection/Stacey [name]/cd   Vital Signs:  Patient profile:   60 year old female Weight:      168 pounds BMI:     24.90 Temp:     97.7 degrees F Pulse rate:   41 / minute BP sitting:   126 / 88  (left arm) Cuff size:   regular  Vitals Entered By: Lamar Sprinkles, CMA (December 05, 2009 10:25 AM) CC: C/o sinus pain & pressure x 1 wk. ? fever.    History of Present Illness: 60 yo with complicated medical history here for ? sinus infection.  Symptoms have been progressing over the past week. Facial pressure, had chills, subjecitve fever last night. Teeth hurting this morning.  No cough, runny nose, sneezing or sore throat.  No SOB or CP.   Not taking anyting OTC due to her numerous medical problems (including recent GIB).  Bradycardic.  [name] is asymptomatic.  Reviwed cardiology notes- Metoprolol dosage increased last month.  Has hypertrophic cardiomyopathy.  Current Medications (verified): 1)  Benazepril Hcl 20 Mg Tabs (Benazepril Hcl) .... One Daily 2)  Metoprolol Succinate 200 Mg Xr24h-Tab (Metoprolol Succinate) .... One Daily 3)  Hydrocodone-Acetaminophen 5-500 Mg Tabs (Hydrocodone-Acetaminophen) .... As Needed 4)  Topamax 200 Mg Tabs (Topiramate) .... 2 By Mouth Daily 5)  Gabapentin 300 Mg Caps (Gabapentin) .Marland Kitchen.. 1 By Mouth Three Times A Day 6)  Cetirizine Hcl 10 Mg Tabs (Cetirizine Hcl) .Marland Kitchen.. 1po Once Daily As Needed Allergies 7)  Oxygen .... 2l At Night 8)  Amoxicillin 500 Mg Tabs (Amoxicillin) .Marland Kitchen.. 1 Tab By Mouth Two Times A Day X 10 Days  Allergies (verified): 1)  ! Codeine 2)  ! Asa 3)  ! * Soy  Past History:  Past Medical History: Last updated: 10/20/2009 Hyperlipidemia Hypertension Sickle Cell Anemia Hypertrophic Cardiomyopathy - Left Ventricle Borderline Diabetes Seasonal Asthma Obesity Anxiety Depression Congestive heart failure Colonic polyps, hx of Allergic rhinitis Asymptomatic bradycardia  Past Surgical History: Last updated:  10/20/2009 Hysterectomy - 1987 Ventral hernia repair - 6/08  Family History: Last updated: 12/07/2006 adopted - no hx available except: daughter with colon cancer daughter with bleeding disorder  Social History: Last updated: 08/31/2009 Former Smoker.  Quit smoking 2001.  Smoked on and off x 20 years.  Smoked 2-3 ciag daily.   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 ENT:  Complains of sinus pressure; denies postnasal drainage and sore throat. CV:  Denies chest pain or discomfort. Resp:  Denies cough. Neuro:  Denies sensation of room spinning.  Physical Exam  General:  alert and overweight-appearing.   Ears:  R ear normal and L ear normal.   Nose:  boggy turbinates and erythema, right>left  Mouth:  no gingival abnormalities and pharynx pink and moist.   Lungs:  normal respiratory effort and normal breath sounds.   Heart:  bradycardic Psych:  not depressed appearing and slightly anxious.     Impression & Recommendations:  Problem # 1:  BRADYCARDIA (ICD-427.89) Assessment Unchanged asymptomatic.  advised calling cardiology next week if she does become symptomatic- discussed symptoms such as fatigue, dizziness. Her updated medication list for this problem includes:    Metoprolol Succinate 200 Mg Xr24h-tab (Metoprolol succinate) ..... One daily  Problem # 2:  SINUSITIS- ACUTE-NOS (ICD-461.9) Assessment: New given duration and progression of symptoms, will treat for bacterial sinusitis with  amoxicillin.  see [name] instrucitons for details. Her updated medication list for this problem includes:    Amoxicillin 500 Mg Tabs (Amoxicillin) .Marland Kitchen... 1 tab by mouth two times a day x 10 days  Complete Medication List: 1)  Benazepril Hcl 20 Mg Tabs (Benazepril hcl) .... One daily 2)  Metoprolol Succinate 200 Mg Xr24h-tab (Metoprolol succinate) ....  One daily 3)  Hydrocodone-acetaminophen 5-500 Mg Tabs (Hydrocodone-acetaminophen) .... As needed 4)  Topamax 200 Mg Tabs (Topiramate) .... 2 by mouth daily 5)  Gabapentin 300 Mg Caps (Gabapentin) .Marland Kitchen.. 1 by mouth three times a day 6)  Cetirizine Hcl 10 Mg Tabs (Cetirizine hcl) .Marland Kitchen.. 1po once daily as needed allergies 7)  Oxygen  .... 2l at night 8)  Amoxicillin 500 Mg Tabs (Amoxicillin) .Marland Kitchen.. 1 tab by mouth two times a day x 10 days  Patient Instructions: 1)  Take antibiotic as directed.  Drink lots of fluids.  Call if not improving as expected in 5-7 days.  Prescriptions: AMOXICILLIN 500 MG TABS (AMOXICILLIN) 1 tab by mouth two times a day x 10 days  #20 x 0   Entered and Authorized by:   Ruthe Mannan MD   Signed by:   Ruthe Mannan MD on 12/05/2009   Method used:   Electronically to        [facility] [phone]* (retail)       [address]       [place], [place]  [address]       Ph: [phone]       Fax: [phone]   RxID:   [id number]    Orders Added: 1)  Est. Patient Level IV [24401]

## 2010-02-23 NOTE — Assessment & Plan Note (Signed)
Summary: [facility] EMR PILOT   Referred by:  Oliver Barre, MD PCP:  Oliver Barre, MD  Chief Complaint:  history of polyps and family history of colon cancer.  History of Present Illness: She had a colonoscopy 9/04 [facility]) with polyps removed (path not available today). Doughter recently diagnosed with colon cancer at 37 yrs. Ms. Okey Dupre is concerned about her own risk and need for repeat colonoscopy.  Does sdescribe some post-prandial diarrhe-like stools. No bleeding.  Gas and bloating at times, especially after dairy (avoids).  GI ROS otherwise negative.   Current Allergies: ! CODEINE   Social History:    Former Smoker    Alcohol use-no    Runner, broadcasting/film/video at [employer]   Risk Factors:  Colonoscopy History:     Date of Last Colonoscopy:  09/26/2002    Results:  DR. Victorino Dike  3 DIMINUTIVE POLYPS PATH UNKNOWN DIVERTICULOSIS    Review of Systems       some allergy problems, eyeglasses   Vital Signs:  Patient Profile:   60 Years Old Female Height:     69 inches Weight:      166 pounds BMI:     24.60 Pulse rate:   56 / minute BP sitting:   142 / 86                 Physical Exam  General:     Well developed, well nourished, no acute distress. Head:     Normocephalic and atraumatic. Eyes:     PERRLA, no icterus. Mouth:     No deformity or lesions, dentition normal. Neck:     Supple; no masses or thyromegaly. Lungs:     Clear throughout to auscultation. Heart:     heart murmur systolic:.  crescendo-decrescendo. II/VI, best at apex, S1, S2 ok increased PMI Abdomen:     Soft, nontender and nondistended. No masses, hepatosplenomegaly or hernias noted. Normal bowel sounds. Rectal:     deferred until time of colonoscopy.   Neurologic:     Alert and  oriented x4;  grossly normal neurologically. Psych:     Alert and cooperative. Normal mood and affect.   Colonoscopy  Procedure date:  09/26/2002  Findings:      DR. Victorino Dike  3 DIMINUTIVE  POLYPS PATH UNKNOWN DIVERTICULOSIS    Impression & Recommendations:  Problem # 1:  COLONIC POLYPS, HX OF (ICD-V12.72) Assessment: Unchanged SCHEDULE COLONOSCOPY  Problem # 2:  ADENOCARCINOMA, COLON, FAMILY HX (ICD-V16.0) Assessment: New SCHEDULE COLONOSCOPY   Patient Instructions: 1)  STANDARD PREP INSTRUCTIONS AND INFORMED CONSENT DISCUSSED WITH PATIENT TODAY    ]   Appended Document: [facility] EMR PILOT SHE WAS GIVEN HANDOUT RE: FAMILY HISTORY OF COLON CANCER, IT IS A QUESTIONNAIRE AND WILL BE REVIEWED UPON RETURN. MAY BE A CANDIDATE FOR GENETIC TESTING.  DISCUSSED NEED FOR 2 OTHER CHILDREN TO GET COLONOSCOPIES ALSO.
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
## 2010-02-23 NOTE — Assessment & Plan Note (Signed)
Summary: left elbow pain/#.cd   Vital Signs:  Patient profile:   60 year old female Height:      67 inches Weight:      165 pounds BMI:     25.94 O2 Sat:      96 % on Room air Temp:     97.9 degrees F oral Pulse rate:   87 / minute BP sitting:   140 / 72  (left arm) Cuff size:   regular  Vitals Entered ByZella Ball Ewing (December 10, 2008 3:27 PM)  O2 Flow:  Room air  Preventive Care Screening  Last Flu Shot:    Date:  11/10/2008    Results:  given   CC: left shoulder pain and blurred vision/RE   Primary Care Provider:  Oliver Barre, MD  CC:  left shoulder pain and blurred vision/RE.  History of Present Illness: here with 1.5 wks gradually worsening left shoulder pain, now mod to severe;  and cannot raise arm above horizontal;  no falls or trauma, no fever, wt loss, no neck pain;  the pain seems to radiate towards the elbow, and has some left wrist stiffness;  not sure if worse to lay on left side since she does not do that;  Pt denies CP, sob, doe, wheezing, orthopnea, pnd, worsening LE edema, palps, dizziness or syncope Pt denies new neuro symptoms such as headache, facial or extremity weakness.  Menitons [facility]X type [facility] were neg.     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 Once Daily 2)  Metoprolol Succinate 100 Mg  Tb24 (Metoprolol Succinate) .Marland Kitchen.. 1 and 1/2 By Mouth Qd 3)  Hydrocodone-Acetaminophen 5-500 Mg Tabs (Hydrocodone-Acetaminophen) .... As Needed 4)  Baclofen 10 Mg Tabs (Baclofen) .... Upto 4 Times Daily 5)  Cefuroxime Axetil 250 Mg Tabs (Cefuroxime Axetil) .Marland Kitchen.. 1 Bid  Current Medications (verified): 1)  Benazepril Hcl 40 Mg Tabs (Benazepril Hcl) .... Once Daily 2)  Metoprolol Succinate 100 Mg  Tb24 (Metoprolol Succinate) .Marland Kitchen.. 1 and 1/2 By Mouth Qd 3)  Hydrocodone-Acetaminophen 5-500 Mg Tabs (Hydrocodone-Acetaminophen) .... As Needed 4)  Baclofen 10 Mg Tabs (Baclofen) .... Upto 4 Times Daily 5)  Gabapentin 300 Mg Caps (Gabapentin) .Marland Kitchen.. 1po Three Times A Day 6)  Prednisone 10 Mg Tabs (Prednisone) .... 4po Qd For 3days, Then 3po Qd For 3days, Then 2po Qd For 3days, Then 1po Qd For 3 Days, Then Stop  Allergies (verified): 1)  ! Codeine 2)  ! 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  Quit smoking 2001.  Smoked on and off x 20 years.  Smoked 2-3 ciag daily.   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         all otherwise negative per pt -  Physical Exam  General:  alert and overweight-appearing.   Head:  normocephalic and atraumatic.   Eyes:  vision grossly intact, pupils equal, and pupils round.   Ears:  R ear normal and L ear normal.   Nose:  no external deformity and no nasal discharge.   Mouth:  no gingival abnormalities and pharynx pink and moist.   Neck:  supple and no masses.   Lungs:  normal respiratory effort and normal breath sounds.   Heart:  normal rate and regular rhythm.   Abdomen:  soft, non-tender, and normal bowel sounds.   Msk:  no joint tenderness and no joint swelling.  except for moderate left bicipital tendon insertion site tenderness without swelling or erythema Extremities:  no edema, no erythema  Neurologic:  cranial nerves II-XII intact and strength normal in all extremities.     Impression & Recommendations:  Problem # 1:  Preventive Health Care (ICD-V70.0) Overall doing well, up to date, counseled on routine health concerns for screening and prevention, immunizations up to date or declined, labs declined , for tetanus update today, will try to get recent labs [facility]  Problem # 2:  SHOULDER PAIN, LEFT (ICD-719.41)  Her updated medication list for this problem includes:    Hydrocodone-acetaminophen 5-500 Mg Tabs (Hydrocodone-acetaminophen) .Marland Kitchen... As needed    Baclofen 10 Mg Tabs (Baclofen) ..... Upto 4 times daily exam c/w bicipital tendonitits; tx empirically wit prednisone as she is trying to avoid ortho and cortisone shot;  pt to call in 1 wk if needs  referral  Complete Medication List: 1)  Benazepril Hcl 40 Mg Tabs (Benazepril hcl) .... Once daily 2)  Metoprolol Succinate 100 Mg Tb24 (Metoprolol succinate) .Marland Kitchen.. 1 and 1/2 by mouth qd 3)  Hydrocodone-acetaminophen 5-500 Mg Tabs (Hydrocodone-acetaminophen) .... As needed 4)  Baclofen 10 Mg Tabs (Baclofen) .... Upto 4 times daily 5)  Gabapentin 300 Mg Caps (Gabapentin) .Marland Kitchen.. 1po three times a day 6)  Prednisone 10 Mg Tabs (Prednisone) .... 4po qd for 3days, then 3po qd for 3days, then 2po qd for 3days, then 1po qd for 3 days, then stop  Other Orders: Tdap => 40yrs IM (16109) Admin 1st Vaccine (60454)  Patient Instructions: 1)  you had the tetanus shot today 2)  please call for your yearly mammogram with [facility] on [address] 3)  Please take all new medications as prescribed  - the prednisone 4)  Continue all previous medications as before this visit 5)  If not better in 1 wk, please call and we can refer to orthopedic 6)  Please schedule a follow-up appointment in  1 year or sooner if needed Prescriptions: PREDNISONE 10 MG TABS (PREDNISONE) 4po qd for 3days, then 3po qd for 3days, then 2po qd for 3days, then 1po qd for 3 days, then stop  #30 x 0   Entered and Authorized by:   [name] MD   Signed by:   [name] MD on 12/10/2008   Method used:   Print then Give to Patient   RxID:   [id number]    Immunizations Administered:  Tetanus Vaccine:    Vaccine Type: Tdap    Site: right deltoid    Mfr: GlaxoSmithKline    Dose: 0.5 ml    Route: IM    Given by: [name] Ewing    Exp. Date: 08/09/2010    Lot #: [id number]    VIS given: 12/12/06 version given December 10, 2008.

## 2010-02-23 NOTE — Progress Notes (Signed)
  Medications Added BENAZEPRIL HCL 40 MG TABS (BENAZEPRIL HCL) Take 1 tablet by mouth once a day METOPROLOL TARTRATE 50 MG TABS (METOPROLOL TARTRATE) Take 1 tablet by mouth twice a day SINGULAIR 10 MG TABS (MONTELUKAST SODIUM) Take 1 tablet by mouth once a day ZYRTEC ALLERGY 10 MG TABS (CETIRIZINE HCL) Take 1 tablet by mouth once a day       Phone Note Call from Patient Call back at [facility] Phone [phone]   Caller: Patient Call For: dr Jonny Ruiz Reason for Call: Refill Medication Complaint: Cough/Sore throat Summary of Call: per pt have URI,SORE THROAT, EAR ACHE, CONGESTION.  PT REQUESTING AN ANTIBOTIC SENT TO [facility] ON  [address] WAS TOLD BY APPT DESK THAT DR Jonny Ruiz DOES NOT HAVE ANY APPTS.Marland KitchenPatient's chart has been requested.   Initial call taken by: Shelbie Proctor,  December 06, 2006 8:39 AM  Follow-up for Phone Call        needs ov Follow-up by: [name] MD,  December 06, 2006 8:50 AM  Additional Follow-up for Phone Call Additional follow up Details #1::        December 06, 2006 10:11 AM called pt to make aware that appt is needed per pt will schedule  Additional Follow-up by: Shelbie Proctor,  December 06, 2006 10:13 AM    New/Updated Medications: BENAZEPRIL HCL 40 MG TABS (BENAZEPRIL HCL) Take 1 tablet by mouth once a day METOPROLOL TARTRATE 50 MG TABS (METOPROLOL TARTRATE) Take 1 tablet by mouth twice a day SINGULAIR 10 MG TABS (MONTELUKAST SODIUM) Take 1 tablet by mouth once a day ZYRTEC ALLERGY 10 MG TABS (CETIRIZINE HCL) Take 1 tablet by mouth once a day

## 2010-02-23 NOTE — Progress Notes (Signed)
       New/Updated Medications: TOPAMAX 50 MG TABS (TOPIRAMATE) 1 1/2 tab by mouth once daily GABAPENTIN 300 MG CAPS (GABAPENTIN) 1 by mouth three times a day

## 2010-02-23 NOTE — Letter (Signed)
Summary: [facility]   Imported By: Sherian Rein 12/12/2008 13:22:37  _____________________________________________________________________  External Attachment:    Type:   Image     Comment:   External Document

## 2010-02-23 NOTE — Progress Notes (Signed)
Summary: referrall  Phone Note Call from Patient Call back at Home Phone [phone]   Caller: Patient/[phone] Call For: [name] MD Reason for Call: Referral Summary of Call: per Silas [place] call staes dr Jonny Ruiz [facility],pt states they are not successful in treating her problem , not having a headache problem,pt states she is having a neurological illness want a referral to [facility]: , Need referral Immediately per Silas [place]  Initial call taken by: Shelbie Proctor,  March 05, 2008 8:43 AM  Follow-up for Phone Call        this is very unlikely to help, since the doctors are the same speciality; I really dont think she needs this Follow-up by: [name] MD,  March 05, 2008 12:19 PM  Additional Follow-up for Phone Call Additional follow up Details #1::        called pt to call  back  March 05, 2008 1:56 PM pt called back to inform , pt made aware Additional Follow-up by: Shelbie Proctor,  March 05, 2008 1:51 PM
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
## 2010-02-23 NOTE — Progress Notes (Signed)
  Phone Note Call from Patient Call back at Home Phone [phone]   Caller: Patient Call For: dr Jonny Ruiz Complaint: Cough/Sore throat Summary of Call: had a cold for few days which she think it turned into bronchitis what an asap appt Initial call taken by: Shelbie Proctor,  January 10, 2007 2:47 PM  Follow-up for Phone Call        next avail is best I can do Follow-up by: [name] MD,  January 10, 2007 3:28 PM  Additional Follow-up for Phone Call Additional follow up Details #1::        Phone Call Completed called pt to inform  will schedule an appt to see  the doctor transfer pt to [facility] so she could make an appt Additional Follow-up by: Shelbie Proctor,  January 10, 2007 4:13 PM

## 2010-02-23 NOTE — Assessment & Plan Note (Signed)
Summary: SINUS PROBLEM -EYES SWOLLEN  $50   STC   Vital Signs:  Patient Profile:   60 Years Old Female Height:     69 inches Weight:      161.25 pounds Temp:     99.1 degrees F oral Pulse rate:   81 / minute BP sitting:   138 / 70  (right arm) Cuff size:   regular  Vitals Entered By: Windell Norfolk (March 07, 2008 4:25 PM)                 Referred by:  Oliver Barre, MD PCP:  Oliver Barre, MD  Chief Complaint:  SINUS PROBLEMS 4-5 DAYS.  History of Present Illness: here with acute onset painless but uncomfortable facial edema with right upper eyelid especially quite swollen since last PM after "new heat" blowing at work, and no other new exposures; does take chronic ace for HTN; no fever, erythema or malaise, tongue swelling or sob; no CP or chills, ST or cough; no specific rash noted; no cough or wheezing, CP and not using any inhaler increased at this time, no DOE or awakening at night     Updated Prior Medication List: BENAZEPRIL HCL 40 MG TABS (BENAZEPRIL HCL) once daily METOPROLOL SUCCINATE 100 MG  TB24 (METOPROLOL SUCCINATE) 1 and 1/2 by mouth qd HYDROCODONE-ACETAMINOPHEN 5-500 MG TABS (HYDROCODONE-ACETAMINOPHEN) as needed PREDNISONE 10 MG TABS (PREDNISONE) 4po qd for 3days, then 3po qd for 3days, then 2po qd for 3days, then 1po qd for 3 days, then stop  Current Allergies (reviewed today): ! CODEINE  Past Medical History:    Reviewed history from 12/07/2006 and no changes required:       Hyperlipidemia       Hypertension       Sickle Cell Anemia       Hypertrophic Cardiomyopathy - Left Ventricle       Borderline Diabetes       Asthma       Obesity       Anxiety       Depression       Congestive heart failure       Colonic polyps, hx of       Allergic rhinitis  Past Surgical History:    Reviewed history from 12/07/2006 and no changes required:       Hysterectomy - 1987       s/p ventral hernia repair - 6/08   Social History:    Reviewed history from  01/19/2007 and no changes required:       Former Smoker       Alcohol use-no       Runner, broadcasting/film/video at [employer]    Review of Systems       all otherwise negative    Physical Exam  General:     alert and overweight-appearing.   Head:     Normocephalic and atraumatic without obvious abnormalities. No apparent alopecia or balding. Eyes:     right > left eyelids with angiedema swelling without erythema or tenderness; some to face right> left as well Ears:     External ear exam shows no significant lesions or deformities.  Otoscopic examination reveals clear canals, tympanic membranes are intact bilaterally without bulging, retraction, inflammation or discharge. Hearing is grossly normal bilaterally. Nose:     External nasal examination shows no deformity or inflammation. Nasal mucosa are pink and moist without lesions or exudates. Mouth:     good dentition.  , no erytthema or  tongue swelling Neck:     supple and no masses.   Lungs:     Normal respiratory effort, chest expands symmetrically. Lungs are clear to auscultation, no crackles or wheezes. Heart:     normal rate and regular rhythm.   Extremities:     no edema, no ulcers     Impression & Recommendations:  Problem # 1:  ANGIOEDEMA (ICD-995.1) new heat at work per pt and is adamant this is cause - will defer to pt and tx with depomedrol 120 mg IM, as well as prednisone course; i'm not exactly sure and would like to consider to stop the ace but she is adamant at this point - to certainly call for any worsening s/s or to ER for any problems breathing or tongue sweling  Problem # 2:  ASTHMA (ICD-493.90)  The following medications were removed from the medication list:    Singulair 10 Mg Tabs (Montelukast sodium) .Marland Kitchen... Take 1 tablet by mouth once a day  Her updated medication list for this problem includes:    Prednisone 10 Mg Tabs (Prednisone) .Marland KitchenMarland KitchenMarland KitchenMarland Kitchen 4po qd for 3days, then 3po qd for 3days, then 2po qd for 3days, then 1po  qd for 3 days, then stop  Orders: Depo- Medrol 40mg  (J1030) Depo- Medrol 80mg  (J1040) Admin of Therapeutic Inj  intramuscular or subcutaneous (11914) o/w stable it seems without symptoms - to cont same meds  Problem # 3:  HYPERTENSION (ICD-401.9)  Her updated medication list for this problem includes:    Benazepril Hcl 40 Mg Tabs (Benazepril hcl) ..... Once daily    Metoprolol Succinate 100 Mg Tb24 (Metoprolol succinate) .Marland Kitchen... 1 and 1/2 by mouth qd  BP today: 138/70 Prior BP: 148/90 (03/07/2008)  Labs Reviewed: Creat: 0.6 (03/22/2006) Chol: 213 (03/22/2006)   HDL: 39.3 (03/22/2006)   LDL: 112.4 (03/22/2006)   TG: 474 (03/22/2006) stable overall by hx and exam, ok to continue meds/tx as is , though again with any worsening or persistnet symptoms shoud stop the ace  Complete Medication List: 1)  Benazepril Hcl 40 Mg Tabs (Benazepril hcl) .... Once daily 2)  Metoprolol Succinate 100 Mg Tb24 (Metoprolol succinate) .Marland Kitchen.. 1 and 1/2 by mouth qd 3)  Hydrocodone-acetaminophen 5-500 Mg Tabs (Hydrocodone-acetaminophen) .... As needed 4)  Prednisone 10 Mg Tabs (Prednisone) .... 4po qd for 3days, then 3po qd for 3days, then 2po qd for 3days, then 1po qd for 3 days, then stop   Patient Instructions: 1)  Please take all new medications as prescribed 2)  Continue all medications that you may have been taking previously  3)  please call for recurrent or persistent symptoms 4)  Please schedule a follow-up appointment for October 2010 for yearly exam wtih CPX labs   Prescriptions: PREDNISONE 10 MG TABS (PREDNISONE) 4po qd for 3days, then 3po qd for 3days, then 2po qd for 3days, then 1po qd for 3 days, then stop  #30 x 0   Entered and Authorized by:   [name] MD   Signed by:   [name] MD on 03/07/2008   Method used:   Print then Give to Patient   RxID:   [id number]    Medication Administration  Injection # 1:    Medication: Depo- Medrol 40mg     Diagnosis: ASTHMA  (ICD-493.90)    Route: IM    Site: RUOQ gluteus    Exp Date: 02/24/2009    Lot #: [id number]    Mfr: teva    Patient tolerated injection without  complications    Given by: Windell Norfolk (March 07, 2008 5:00 PM)  Injection # 2:    Medication: Depo- Medrol 80mg     Diagnosis: ASTHMA (ICD-493.90)    Route: IM    Site: RUOQ gluteus    Exp Date: 02/24/2009    Lot #: [id number]    Mfr: teva    Patient tolerated injection without complications    Given by: Windell Norfolk (March 07, 2008 5:00 PM)  Orders Added: 1)  Depo- Medrol 40mg  [J1030] 2)  Depo- Medrol 80mg  [J1040] 3)  Admin of Therapeutic Inj  intramuscular or subcutaneous [96372] 4)  Est. Patient Level IV [54098]

## 2010-02-23 NOTE — Progress Notes (Signed)
Summary: thrush rx  Phone Note Call from Patient Call back at Home Phone [phone]   Caller: Patient/[phone] Call For: [name] MD Summary of Call: per Silas [place] call need a rx for thrush states she was suppose to get this at the time of her [facility] [phone]* (retail) [address] [place], [place]  [address] Ph: [phone] Fax: [phone] Initial call taken by: Shelbie Proctor,  March 13, 2008 4:38 PM  Follow-up for Phone Call        sorry, this will be done escript Follow-up by: [name] MD,  March 13, 2008 5:49 PM  Additional Follow-up for Phone Call Additional follow up Details #1::        called pt to inform rx was sent Additional Follow-up by: Shelbie Proctor,  March 14, 2008 9:03 AM    New/Updated Medications: NYSTATIN 100000 UNIT/ML SUSP (NYSTATIN) 5 cc by mouth swish and spit four times per day for 10 days   Prescriptions: NYSTATIN 100000 UNIT/ML SUSP (NYSTATIN) 5 cc by mouth swish and spit four times per day for 10 days  #10days x 1   Entered and Authorized by:   [name] MD   Signed by:   [name] MD on 03/13/2008   Method used:   Electronically to        [facility] [phone]* (retail)       [address]       [place], [place]  [address]       Ph: [phone]       Fax: [phone]   RxID:   [id number]

## 2010-02-23 NOTE — Consult Note (Signed)
[facility]   Imported By: Maryln Gottron 01/22/2008 14:01:38  _____________________________________________________________________  External Attachment:    Type:   Image     Comment:   External Document

## 2010-02-23 NOTE — Progress Notes (Signed)
Summary: HYDROCODONE-  Phone Note Call from Patient Call back at Home Phone [phone]   Caller: Patient Call For: [name] MD Summary of Call: per Silas [place] call  received a rx for HYDROCODONE-ACETAMINOPHEN 5-500 MG TABS (HYDROCODONE-ACETAMINOPHEN) as needed from [facility]  2 weeks ago now almost out , want to know  if dr Jonny Ruiz could give her a refill ,almost out , also wanted to inform Dr Jonny Ruiz tht darvocet is not  working well for her ,,, Initial call taken by: Shelbie Proctor,  March 14, 2008 11:13 AM  Follow-up for Phone Call        sorry - this is not one of her chronic medications, and I would not know what I am treating; we have to avoid ongoing controlled drugs to risk or addiction Follow-up by: [name] MD,  March 14, 2008 1:02 PM  Additional Follow-up for Phone Call Additional follow up Details #1::        called pt to inform left pt a msg on machine  Additional Follow-up by: Shelbie Proctor,  March 17, 2008 8:18 AM

## 2010-02-23 NOTE — Letter (Signed)
Summary: [facility]   Imported By: Lester Atlanta 12/21/2009 07:04:35  _____________________________________________________________________  External Attachment:    Type:   Image     Comment:   External Document

## 2010-02-23 NOTE — Assessment & Plan Note (Signed)
Summary: SINUS INFECTION/ [facility] PT /NWS   Vital Signs:  Patient profile:   60 year old female Height:      69 inches Weight:      157 pounds BMI:     23.27 Temp:     96.9 degrees F oral Pulse rate:   65 / minute BP sitting:   150 / 110  (left arm) Cuff size:   regular  Vitals Entered By: Bill Salinas CMA (July 18, 2008 3:03 PM) CC: pt here and c/o severe press. around her sinus cavity, she said the pain is so bad she cant eat or drink/ ab   Referring Provider:  Dr Darrow Bussing Primary Provider:  Oliver Barre, MD  CC:  pt here and c/o severe press. around her sinus cavity and she said the pain is so bad she cant eat or drink/ ab.  History of Present Illness: 5 days of pain at the right maxillary area, rhinorrhea, and associated nasal congestion. pt did not take her bp meds today.  Current Medications (verified): 1)  Benazepril Hcl 40 Mg Tabs (Benazepril Hcl) .... Once Daily 2)  Metoprolol Succinate 100 Mg  Tb24 (Metoprolol Succinate) .Marland Kitchen.. 1 and 1/2 By Mouth Qd 3)  Hydrocodone-Acetaminophen 5-500 Mg Tabs (Hydrocodone-Acetaminophen) .... As Needed 4)  Baclofen 10 Mg Tabs (Baclofen) .... Upto 4 Times Daily  Allergies (verified): 1)  ! Codeine 2)  ! Jonne Ply  Past History:  Past Medical History: Last updated: 03/11/2008 Hyperlipidemia Hypertension Sickle Cell Anemia Hypertrophic Cardiomyopathy - Left Ventricle Borderline Diabetes Seasonal Asthma Obesity Anxiety Depression Congestive heart failure Colonic polyps, hx of Allergic rhinitis  Review of Systems  The patient denies dyspnea on exertion.         she is uncertain about fever.  Physical Exam  General:  normal appearance.   Head:  head: no deformity eyes: no periorbital swelling, no proptosis external nose and ears are normal mouth: no lesion seen  Ears:  TM's intact and clear with normal canals with grossly normal hearing.   Neck:  supple   Impression & Recommendations:  Problem # 1:  SINUSITIS-  ACUTE-NOS (ICD-461.9) recurrent  Problem # 2:  HYPERTENSION (ICD-401.9) therapy limited by noncompliance.   Medications Added to Medication List This Visit: 1)  Cefuroxime Axetil 250 Mg Tabs (Cefuroxime axetil) .Marland Kitchen.. 1 bid  Other Orders: Est. Patient Level III (16109)  Patient Instructions: 1)  you should never miss your blood pressure medications. 2)  cefuroxime 250 mg two times a day 3)  loratadine-d as needed congestion 4)  ret prn Prescriptions: CEFUROXIME AXETIL 250 MG TABS (CEFUROXIME AXETIL) 1 bid  #14 x 0   Entered and Authorized by:   Minus Breeding MD   Signed by:   Minus Breeding MD on 07/18/2008   Method used:   Electronically to        [facility] [phone]* (retail)       [address]       [place], [place]  [address]       Ph: [phone]       Fax: [phone]   RxID:   [id number]

## 2010-02-23 NOTE — Assessment & Plan Note (Signed)
Summary: no visit...pt rescheduled   Visit Type:  Initial Consult  Chief Complaint:  pulmonary consult.....Marland Kitchenlow oxygen when sleeping....reviewed meds.    Updated Prior Medication List: BENAZEPRIL HCL 40 MG TABS (BENAZEPRIL HCL) once daily SINGULAIR 10 MG TABS (MONTELUKAST SODIUM) Take 1 tablet by mouth once a day ZYRTEC ALLERGY 10 MG TABS (CETIRIZINE HCL) Take 1 tablet by mouth once a day METOPROLOL SUCCINATE 100 MG  TB24 (METOPROLOL SUCCINATE) 1 and 1/2 by mouth qd HYDROCODONE-ACETAMINOPHEN 5-500 MG TABS (HYDROCODONE-ACETAMINOPHEN) as needed  Current Allergies: ! CODEINE  Vital Signs:  Patient Profile:   60 Years Old Female Height:     69 inches Weight:      159 pounds BMI:     23.57 O2 Sat:      95 % O2 treatment:    Room Air Temp:     98.0 degrees F oral Pulse rate:   74 / minute BP sitting:   148 / 90  (left arm) Cuff size:   regular  Pt. in pain?   no  Vitals Entered By: Clarise Cruz Duncan Dull) (March 07, 2008 1:36 PM)                  Medications Added to Medication List This Visit: 1)  Benazepril Hcl 40 Mg Tabs (Benazepril hcl) .... Once daily 2)  Hydrocodone-acetaminophen 5-500 Mg Tabs (Hydrocodone-acetaminophen) .... As needed

## 2010-02-23 NOTE — Progress Notes (Signed)
Summary: BP  Phone Note Other Incoming   Caller: Patient walk-In Summary of Call: Pt walked in after being told to go to ER or PCP office due to BP of 160/120 at PT. Pt was given first available with Dr Everardo All. At pt request BP was checked (orthostatic), and BP was: sitting 142/86, standing 126/82 and laying 130/80. after speaking with pt, she stated that PT was having trouble with BP machine and with hearing BP. I informed Dr. Jonny Ruiz (PCP) he said it would be okay for pt to cancel appt. I gave a copy of readings to pt for her records. Initial call taken by: Margaret Pyle, CMA,  January 15, 2009 11:22 AM

## 2010-02-23 NOTE — Miscellaneous (Signed)
Summary: RX for Metoprolol/Bena  Clinical Lists Changes  Medications: Changed medication from BENAZEPRIL HCL 40 MG TABS (BENAZEPRIL HCL) 1 by mouth once daily to BENAZEPRIL HCL 20 MG TABS (BENAZEPRIL HCL) one daily - Signed Changed medication from METOPROLOL SUCCINATE 100 MG  TB24 (METOPROLOL SUCCINATE) 1 and 1/2 by mouth once daily to METOPROLOL SUCCINATE 100 MG  TB24 (METOPROLOL SUCCINATE) two daily - Signed Changed medication from METOPROLOL SUCCINATE 100 MG  TB24 (METOPROLOL SUCCINATE) two daily to METOPROLOL SUCCINATE 200 MG XR24H-TAB (METOPROLOL SUCCINATE) one daily - Signed Rx of BENAZEPRIL HCL 20 MG TABS (BENAZEPRIL HCL) one daily;  #30 x 6;  Signed;  Entered by: Charolotte Capuchin, RN;  Authorized by: Rollene Rotunda, MD, [facility];  Method used: [id number] [address], [place], [place], [place]  [address], Ph: [phone], Fax: [phone] Rx of METOPROLOL SUCCINATE 100 MG  TB24 (METOPROLOL SUCCINATE) two daily;  #60 x 6;  Signed;  Entered by: Charolotte Capuchin, RN;  Authorized by: Rollene Rotunda, MD, [facility];  Method used: [facility]ically to [id number] [address], [place], [place], [place]  [address], Ph: [phone], Fax: [phone] Rx of METOPROLOL SUCCINATE 200 MG XR24H-TAB (METOPROLOL SUCCINATE) one daily;  #30 x 11;  Signed;  Entered by: Charolotte Capuchin, RN;  Authorized by: Rollene Rotunda, MD, [facility];  Method used: Electronically to [facility] [phone]*, [address] [place], [place]  [address], Ph: [phone], Fax: [phone] Rx of BENAZEPRIL HCL 20 MG TABS (BENAZEPRIL HCL) one daily;  #30 x 11;  Signed;  Entered by: Charolotte Capuchin, RN;  Authorized by: Rollene Rotunda, MD, [facility];  Method used: Electronically to [facility] [phone]*, [address] [place], [place]  [address], Ph: [phone], Fax: [phone] Observations: Added new observation of PI CARDIO: Your physician recommends that you  schedule a follow-up appointment as needed Your physician has recommended you make the following change in your medication: Decrease Benazepril to 20 mg a day and increase Metoprolol to 200 mg a day (11/17/2009 10:03)    Prescriptions: BENAZEPRIL HCL 20 MG TABS (BENAZEPRIL HCL) one daily  #30 x 11   Entered by:   Charolotte Capuchin, RN   Authorized by:   Rollene Rotunda, MD, [facility]   Signed by:   Charolotte Capuchin, RN on 11/17/2009   Method used:   Electronically to        [facility] [phone]* (retail)       [address]       [place], [place]  [address]       Ph: [phone]       Fax: [phone]   RxID:   [id number] METOPROLOL SUCCINATE 200 MG XR24H-TAB (METOPROLOL SUCCINATE) one daily  #30 x 11   Entered by:   Charolotte Capuchin, RN   Authorized by:   Rollene Rotunda, MD, [facility]   Signed by:   Charolotte Capuchin, RN on 11/17/2009   Method used:   Electronically to        [facility] [phone]* (retail)       [address]       [place], [place]  [address]       Ph: [phone]       Fax: [phone]   RxID:   [id number] METOPROLOL SUCCINATE 100 MG  TB24 (METOPROLOL SUCCINATE) two daily  #60 x 6   Entered by:   Charolotte Capuchin, RN   Authorized by:   Rollene Rotunda, MD,  [facility]   Signed by:   Charolotte Capuchin, RN on 11/17/2009   Method used:   [id number] ([facility]ail)       [address]       [place], [place]  [address]       Ph: [phone]       Fax: [phone]   RxID:   [id number] BENAZEPRIL HCL 20 MG TABS (BENAZEPRIL HCL) one daily  #30 x 6   Entered by:   Charolotte Capuchin, RN   Authorized by:   Rollene Rotunda, MD, [facility]   Signed by:   Charolotte Capuchin, RN on 11/17/2009   Method used:   [facility]        [facility] [address] [id number] ([facility]ail)       [address]       [place], [place]  [address]       Ph: [phone]       Fax:  [phone]   RxID:   [id number]    Patient Instructions: 1)  Your physician recommends that you schedule a follow-up appointment as needed 2)  Your physician has recommended you make the following change in your medication: Decrease Benazepril to 20 mg a day and increase Metoprolol to 200 mg a day

## 2010-02-23 NOTE — Letter (Signed)
Summary: Out of Work  [facility]  [address] [place], [place] [address]   Phone: [phone]  Fax: [phone]    October 14, 2009   Employee:  RAJA CAPUTI CRAWFORD-FEWELL    To Whom It May Concern:   For Medical reasons, please excuse the above named employee from work for the following dates:  Start:   October 05, 2009  End:   October 12, 2009  Return to work October 12, 2009  If you need additional information, please feel free to contact our office.         Sincerely,    Dr. Oliver Barre

## 2010-02-23 NOTE — Letter (Signed)
Summary: Out of Work  [facility]  [address] [place], [place] [address]   Phone: [phone]  Fax: [phone]    November 12, 2007   Employee:  Stacey [name]    To Whom It May Concern:   For Medical reasons, please excuse the above named employee from work for the following dates:  Start:   November 12, 2007  End:   November 14, 2007  ---   to return to work Nov 15, 2007  If you need additional information, please feel free to contact our office.         Sincerely,    [name] MD

## 2010-02-23 NOTE — Progress Notes (Signed)
  Phone Note Call from Patient   Caller: Patient Summary of Call: Patient requested a corrected work note as the one from previous OV had her returning to work September 16. The patient is requesting to return to wrok October 12, 2009. Faxed work note to [phone] and called pt. left msg. that work note has been completed and faxed to her employer. Initial call taken by: [name] Ewing CMA Duncan Dull),  October 14, 2009 11:17 AM

## 2010-02-23 NOTE — Op Note (Signed)
Summary: COLONOSCOPY    Colonoscopy  Procedure date:  02/20/2007  Findings:      1) PAN-DIVERTICULOSIS 2) INTERNAL HEMORRHOIDS 3) OTHERWISE NORMAL   Comments:      Repeat colonoscopy in 5 years. Due to family hx of colon cancer (daughter)

## 2010-02-23 NOTE — Assessment & Plan Note (Signed)
Summary: eph/mt  Medications Added TOPAMAX 200 MG TABS (TOPIRAMATE) 2 by mouth daily      Allergies Added:   Visit Type:  Follow-up Primary Provider:  Oliver Barre, MD  CC:  Hypertrophic CM.  History of Present Illness: The patient returns after a two-year absence. She has hypertrophic cardiomyopathy and was to come back for followup echocardiography but she did not do this. She was recently hospitalized with a GI bleed. She did have an echocardiogram at that time which confirmed her cardiomyopathy with a significant peak gradient. She was bradycardic and beta blockers were held for short while but resumed prior to discharge. I have reviewed these records. She is now here for followup.  Since I last saw her she has had no acute cardiovascular complaints. In particular she denies any shortness of breath and has no PND or orthopnea. She has no chest pressure, neck or arm discomfort. She's had no presyncope or syncope. She denies any palpitations.  Of interest she was adopted her recently found her birth family. There is no history of sudden cardiac death or cardiomyopathy.  Current Medications (verified): 1)  Benazepril Hcl 40 Mg Tabs (Benazepril Hcl) .Marland Kitchen.. 1 By Mouth Once Daily 2)  Metoprolol Succinate 100 Mg  Tb24 (Metoprolol Succinate) .Marland Kitchen.. 1 and 1/2 By Mouth Once Daily 3)  Hydrocodone-Acetaminophen 5-500 Mg Tabs (Hydrocodone-Acetaminophen) .... As Needed 4)  Topamax 200 Mg Tabs (Topiramate) .... 2 By Mouth Daily 5)  Gabapentin 300 Mg Caps (Gabapentin) .Marland Kitchen.. 1 By Mouth Three Times A Day 6)  Cetirizine Hcl 10 Mg Tabs (Cetirizine Hcl) .Marland Kitchen.. 1po Once Daily As Needed Allergies 7)  Oxygen .... 2l At Night  Allergies (verified): 1)  ! Codeine 2)  ! Asa 3)  ! 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   Vital Signs:  Patient profile:   60 year old female Height:      69 inches Weight:      167 pounds BMI:     24.75 Pulse rate:   61 / minute Resp:     16 per minute BP sitting:   138 / 58  (right arm)  Vitals Entered By: Marrion Coy, CNA (October 20, 2009 4:25 PM)  Physical Exam  General:  Well developed, well nourished, in no acute distress. Head:  normocephalic and atraumatic Eyes:  PERRLA/EOM intact; conjunctiva and lids normal. Mouth:  Teeth, gums and palate normal. Oral mucosa normal. Neck:  Neck supple, no JVD. No masses, thyromegaly or abnormal cervical nodes. Chest Wall:  no deformities or breast masses noted Lungs:  Clear bilaterally to auscultation and percussion. Abdomen:  Bowel sounds positive; abdomen soft and non-tender without masses, organomegaly, or hernias noted. No hepatosplenomegaly. Msk:  Back normal, normal gait. Muscle strength and tone normal. Extremities:  No clubbing or cyanosis. Neurologic:  Alert and oriented x 3. Skin:  Intact without lesions or rashes. Cervical Nodes:  no significant adenopathy Axillary Nodes:  no significant adenopathy Inguinal Nodes:  no significant adenopathy Psych:  Normal affect.   Detailed Cardiovascular Exam  Neck    Carotids: Carotids full and equal bilaterally without bruits.  Neck Veins: Normal, no JVD.    Heart    Inspection: no deformities or lifts noted.      Palpation: normal PMI with no thrills palpable.      Auscultation: regular rate and rhythm, S1, S2 with 3/6 apical systolic murmur radiating out the aortic outflow tract, increased with the strain phase of Valsalva, no diastolic murmurs, no clicks, no rubs, murmurs transmitted to the carotids  Vascular     Abdominal Aorta: no palpable masses, pulsations, or audible bruits.      Femoral Pulses: normal femoral pulses bilaterally.      Pedal Pulses: normal pedal pulses bilaterally.      Radial Pulses: normal radial pulses bilaterally.      Peripheral Circulation: no clubbing, cyanosis, or edema noted with normal capillary refill.     EKG  Procedure date:  10/20/2009  Findings:      Sinus rhythm, rate 61, left ventricular hypertrophy, LVH with recall, left atrial enlargement, no acute ST-T wave changes  Impression & Recommendations:  Problem # 1:  CARDIOMYOPATHY, HYPERTROPHIC (ICD-425.1) The patient has no symptoms with her cardiomyopathy. She had her recent echo. I plan on bringing her back for treadmill to look for high risk features. We have discussed the need to screen her children. She is tolerating the low dose beta blocker and will continue with this. In the absence of further symptoms no further treatment is necessary but she understands she needs closer followup.  Problem # 2:  HYPERTENSION (ICD-401.9) Her blood pressure is controlled on the meds as listed.  Problem # 3:  BRADYCARDIA (ICD-427.89) She seems to tolerate this amount of beta blocker but would probably not tolerate up titration.

## 2010-02-23 NOTE — Assessment & Plan Note (Signed)
Summary: CONGESTION/ FEVER/ SORE THROAT/BODY ACHES/NWS   Vital Signs:  Patient Profile:   60 Years Old Female Height:     69 inches Temp:     99.4 degrees F oral Pulse rate:   98 / minute BP sitting:   138 / 78  (left arm) Cuff size:   regular  Vitals Entered By: Payton Spark CMA (November 12, 2007 11:47 AM)                 Referred by:  Oliver Barre, MD PCP:  Oliver Barre, MD  Chief Complaint:  congestion, fever, ST, and and body aches.  History of Present Illness: ehre with above for 3 days, and severe right facial pain, pressure, fever and greenish d/c    Updated Prior Medication List: BENAZEPRIL HCL 20 MG  TABS (BENAZEPRIL HCL) 2 daily SINGULAIR 10 MG TABS (MONTELUKAST SODIUM) Take 1 tablet by mouth once a day ZYRTEC ALLERGY 10 MG TABS (CETIRIZINE HCL) Take 1 tablet by mouth once a day METOPROLOL SUCCINATE 100 MG  TB24 (METOPROLOL SUCCINATE) 1 and 1/2 by mouth qd AMOXICILLIN-POT CLAVULANATE 875-125 MG TABS (AMOXICILLIN-POT CLAVULANATE) 1 by mouth two times a day PROMETHAZINE-CODEINE 6.25-10 MG/5ML  SYRP (PROMETHAZINE-CODEINE) 1 tsp by mouth qid prn DARVOCET-N 100 100-650 MG TABS (PROPOXYPHENE N-APAP) 1po q 6 hrs as needed pain  Current Allergies (reviewed today): ! CODEINE  Past Medical History:    Reviewed history from 12/07/2006 and no changes required:       Hyperlipidemia       Hypertension       Sickle Cell Anemia       Hypertrophic Cardiomyopathy - Left Ventricle       Borderline Diabetes       Asthma       Obesity       Anxiety       Depression       Congestive heart failure       Colonic polyps, hx of       Allergic rhinitis  Past Surgical History:    Reviewed history from 12/07/2006 and no changes required:       Hysterectomy - 1987       s/p ventral hernia repair - 6/08   Social History:    Reviewed history from 01/19/2007 and no changes required:       Former Smoker       Alcohol use-no       Runner, broadcasting/film/video at [employer]    Review of  Systems       all otherwise negative    Physical Exam  General:     alert and overweight-appearing.  , mild ill  Head:     Normocephalic and atraumatic without obvious abnormalities. No apparent alopecia or balding. Eyes:     No corneal or conjunctival inflammation noted. EOMI. Perrla.  Ears:     bilat tm's red, sinus severe tender on the right Nose:     nasal dischargemucosal pallor and mucosal erythema.   Mouth:     pharyngeal erythema and fair dentition.   Neck:     No deformities, masses, or tenderness noted. Lungs:     Normal respiratory effort, chest expands symmetrically. Lungs are clear to auscultation, no crackles or wheezes. Heart:     Normal rate and regular rhythm. S1 and S2 normal without gallop, murmur, click, rub or other extra sounds. Extremities:     no edema, no ulcers     Impression & Recommendations:  Problem # 1:  BRONCHITIS-ACUTE (ICD-466.0)  Her updated medication list for this problem includes:    Singulair 10 Mg Tabs (Montelukast sodium) .Marland Kitchen... Take 1 tablet by mouth once a day    Amoxicillin-pot Clavulanate 875-125 Mg Tabs (Amoxicillin-pot clavulanate) .Marland Kitchen... 1 by mouth two times a day    Promethazine-codeine 6.25-10 Mg/58ml Syrp (Promethazine-codeine) .Marland Kitchen... 1 tsp by mouth qid prn treat as above, f/u any worsening signs or symptoms   Problem # 2:  SINUSITIS- ACUTE-NOS (ICD-461.9)  Her updated medication list for this problem includes:    Amoxicillin-pot Clavulanate 875-125 Mg Tabs (Amoxicillin-pot clavulanate) .Marland Kitchen... 1 by mouth two times a day    Promethazine-codeine 6.25-10 Mg/81ml Syrp (Promethazine-codeine) .Marland Kitchen... 1 tsp by mouth qid prn treat as above, f/u any worsening signs or symptoms   Problem # 3:  HYPERTENSION (ICD-401.9)  Her updated medication list for this problem includes:    Benazepril Hcl 20 Mg Tabs (Benazepril hcl) .Marland Kitchen... 2 daily    Metoprolol Succinate 100 Mg Tb24 (Metoprolol succinate) .Marland Kitchen... 1 and 1/2 by mouth qd  BP today:  138/78 Prior BP: 142/86 (01/19/2007)  Labs Reviewed: Creat: 0.6 (03/22/2006) Chol: 213 (03/22/2006)   HDL: 39.3 (03/22/2006)   LDL: DEL (03/22/2006)   TG: 474 (03/22/2006) stable overall by hx and exam, ok to continue meds/tx as is   Complete Medication List: 1)  Benazepril Hcl 20 Mg Tabs (Benazepril hcl) .... 2 daily 2)  Singulair 10 Mg Tabs (Montelukast sodium) .... Take 1 tablet by mouth once a day 3)  Zyrtec Allergy 10 Mg Tabs (Cetirizine hcl) .... Take 1 tablet by mouth once a day 4)  Metoprolol Succinate 100 Mg Tb24 (Metoprolol succinate) .Marland Kitchen.. 1 and 1/2 by mouth qd 5)  Amoxicillin-pot Clavulanate 875-125 Mg Tabs (Amoxicillin-pot clavulanate) .Marland Kitchen.. 1 by mouth two times a day 6)  Promethazine-codeine 6.25-10 Mg/56ml Syrp (Promethazine-codeine) .Marland Kitchen.. 1 tsp by mouth qid prn 7)  Darvocet-n 100 100-650 Mg Tabs (Propoxyphene n-apap) .Marland Kitchen.. 1po q 6 hrs as needed pain   Patient Instructions: 1)  Please take all new medications as prescribed 2)  Continue all medications that you may have been taking previously 3)  Please schedule a follow-up appointment in 3 months with CPX labs and: 4)  HbgA1C prior to visit, ICD-9: 790.2   Prescriptions: DARVOCET-N 100 100-650 MG TABS (PROPOXYPHENE N-APAP) 1po q 6 hrs as needed pain  #60 x 0   Entered and Authorized by:   [name] MD   Signed by:   [name] MD on 11/12/2007   Method used:   Print then Give to Patient   RxID:   [id number] AMOXICILLIN-POT CLAVULANATE 875-125 MG TABS (AMOXICILLIN-POT CLAVULANATE) 1 by mouth two times a day  #20 x 0   Entered and Authorized by:   [name] MD   Signed by:   [name] MD on 11/12/2007   Method used:   Print then Give to Patient   RxID:   [id number] PROMETHAZINE-CODEINE 6.25-10 MG/5ML  SYRP (PROMETHAZINE-CODEINE) 1 tsp by mouth qid prn  #6 oz x 1   Entered and Authorized by:   [name] MD   Signed by:   [name] MD on 11/12/2007   Method used:   Print then Give to Patient    RxID:   [id number]  ]

## 2010-02-23 NOTE — Progress Notes (Signed)
Summary:  neurologist   Phone Note Call from Patient Call back at Home Phone [phone]   Caller: Patient Call For: [name] MD Summary of Call: Stacey [name] call need a referall to see  neurologist  for pain in head  Initial call taken by: Shelbie Proctor,  December 10, 2007 10:29 AM  Follow-up for Phone Call        ok [facility] Follow-up by: [name] MD,  December 10, 2007 12:41 PM  Additional Follow-up for Phone Call Additional follow up Details #1::        Phone Call Completed, Provider Notified called pt to inform left msg on machine that  referal will be made Additional Follow-up by: Shelbie Proctor,  December 10, 2007 2:26 PM  New Problems: HEADACHE (ICD-784.0)   New Problems: HEADACHE (ICD-784.0)

## 2010-02-23 NOTE — Miscellaneous (Signed)
Summary: ONO on RA   Clinical Lists Changes  Orders: Added new Referral order of DME Referral (DME) - Signed ONO on room air shows low sat of 77%, and spent less than 88% with her h/o hemoglobinopathy, I would suggest staying on oxygen at night.

## 2010-02-23 NOTE — Consult Note (Signed)
Summary: LT shoulder/[facility]  LT shoulder/[facility]   Imported By: Sherian Rein 12/30/2008 09:23:54  _____________________________________________________________________  External Attachment:    Type:   Image     Comment:   External Document

## 2010-02-23 NOTE — Progress Notes (Signed)
Summary: ALT med, swollen eyes  Phone Note Call from Patient   Caller: Patient [phone] Reason for Call: Lab or Test Results Summary of Call: Pt called stating she is still having "allergy issues" and her eyes are still swollen. Pt states that medication Rxd are not working and pt is requesting alternate med, please advise Initial call taken by: Margaret Pyle, CMA,  December 25, 2009 3:39 PM  Follow-up for Phone Call        seen per dr Felicity Coyer nov 28  - will defer to her judgement Follow-up by: [name] MD,  December 25, 2009 5:04 PM  Additional Follow-up for Phone Call Additional follow up Details #1::        change abx to ceftin (stop Zpak) and start pred pak - these 2 meds erx to her pharmacy - if cont symptoms, make ROV with JWJ (or can come to Sat clinic here in AM) - thanks Additional Follow-up by: Newt Lukes MD,  December 25, 2009 5:16 PM    Additional Follow-up for Phone Call Additional follow up Details #2::    No answer on call back # given. Home # is not a wking #.................Marland KitchenLamar Sprinkles, CMA  December 25, 2009 5:59 PM   same as above, per pharmacy Rx is waiting for pick up. Verified number with pharmacy. Margaret Pyle, CMA  December 28, 2009 2:08 PM   Additional Follow-up for Phone Call Additional follow up Details #3:: Details for Additional Follow-up Action Taken: pt informed of MD's advisement, rx sent to [facility] per pt's request. Additional Follow-up by: Brenton Grills CMA Duncan Dull),  December 29, 2009 8:43 AM  New/Updated Medications: CEFTIN 250 MG TABS (CEFUROXIME AXETIL) 1 by mouth two times a day x 7 days PREDNISONE (PAK) 10 MG TABS (PREDNISONE) as directed x 6 days Prescriptions: PREDNISONE (PAK) 10 MG TABS (PREDNISONE) as directed x 6 days  #1 pak x 0   Entered by:   Brenton Grills CMA (AAMA)   Authorized by:   [name] MD   Signed by:   Brenton Grills CMA (AAMA) on 12/29/2009   Method used:   Electronically  to        [facility] [phone]* (retail)       [address]       [place], [place]  [address]       Ph: [phone]       Fax: [phone]   RxID:   [id number] CEFTIN 250 MG TABS (CEFUROXIME AXETIL) 1 by mouth two times a day x 7 days  #14 x 0   Entered by:   Brenton Grills CMA (AAMA)   Authorized by:   [name] MD   Signed by:   Brenton Grills CMA (AAMA) on 12/29/2009   Method used:   Electronically to        [facility] [phone]* (retail)       [address]       [place], [place]  [address]       Ph: [phone]       Fax: [phone]   RxID:   [id number] PREDNISONE (PAK) 10 MG TABS (PREDNISONE) as directed x 6 days  #1 pak x 0   Entered by:   Newt Lukes MD   Authorized by:   [name] MD   Signed by:   Newt Lukes MD on 12/25/2009   Method used:   [facility] [address] [id number])       [address]       [place], [place]  [address]       Ph: [phone]       Fax: [phone]   RxID:   [id number] CEFTIN 250 MG TABS (CEFUROXIME AXETIL) 1 by mouth two times a day x 7 days  #14 x 0   Entered by:   Newt Lukes MD   Authorized by:   [name] MD   Signed by:   Newt Lukes MD on 12/25/2009   Method used:   [facility]        [facility] [facility] [address] [phone]* (retail)       [address]       [place], [place]  [address]       Ph: [phone]       Fax: [phone]   RxID:   [id number]

## 2010-02-23 NOTE — Miscellaneous (Signed)
Summary: Orders Update  Clinical Lists Changes  Orders: Added new Service order of [place] NS50 (NS50) - Signed 

## 2010-02-23 NOTE — Progress Notes (Signed)
Summary: ? about work  Advice worker from Patient Call back at [employer] [phone]   Caller: Patient Call For: dr Jonny Ruiz Summary of Call: Per Stacey [name] call was seen today by Dr Jonny Ruiz , want to know if she should stay out of work for  a day or two . Initial call taken by: Shelbie Proctor,  November 12, 2007 2:07 PM  Follow-up for Phone Call        yes, she was supposed to get a note off work - I thought the nurse was going to do it - ok for off work today through wed - hopefully back to work thur Follow-up by: [name] MD,  November 12, 2007 2:14 PM  Additional Follow-up for Phone Call Additional follow up Details #1::        Phone Call Completed, Provider Notified pt wanted note to faxed to her @[phone] PT WAS INFORMED MADE AWARE Additional Follow-up by: Shelbie Proctor,  November 12, 2007 2:20 PM

## 2010-02-23 NOTE — Progress Notes (Signed)
  Phone Note Call from Patient Call back at Home Phone [phone]   Caller: [phone] Summary of Call: per Silas [place] call still have symptoms of cough , coughing up mucas coughing up traces of blood . nauseated pt saw Dr Jonny Ruiz on 11-12-07  havent gotten any better   [address] (retail) [address] [place], [place]  [address] Ph: [phone] Fax: [phone] Initial call taken by: Shelbie Proctor,  November 26, 2007 10:19 AM  Follow-up for Phone Call        ok for zpack today - will need OV if symptoms persist for possible cxr Follow-up by: [name] MD,  November 26, 2007 11:03 AM  Additional Follow-up for Phone Call Additional follow up Details #1::        voice mail is full could not lvg msg Additional Follow-up by: Shelbie Proctor,  November 26, 2007 11:16 AM    Additional Follow-up for Phone Call Additional follow up Details #2::    called pt to inform spoke with pt made aware Follow-up by: Shelbie Proctor,  November 26, 2007 11:50 AM  New/Updated Medications: AZITHROMYCIN 250 MG TABS (AZITHROMYCIN) 2po qd for 1 day, then 1po qd for 4days, then stop   Prescriptions: AZITHROMYCIN 250 MG TABS (AZITHROMYCIN) 2po qd for 1 day, then 1po qd for 4days, then stop  #6 x 1   Entered and Authorized by:   [name] MD   Signed by:   [name] MD on 11/26/2007   Method used:   Electronically to        [facility]* (retail)       [address]       [place], [place]  [address]       Ph: [phone]       Fax: [phone]   RxID:   [id number]

## 2010-02-23 NOTE — Assessment & Plan Note (Signed)
Summary: EYES SWOLLEN DR JWJ PT/NO AM SLOT STC   Vital Signs:  Patient profile:   60 year old female Height:      69 inches (175.26 cm) Weight:      170.8 pounds (77.64 kg) O2 Sat:      96 % on Room air Temp:     99.2 degrees F (37.33 degrees C) oral Pulse rate:   65 / minute BP sitting:   110 / 70  (left arm) Cuff size:   regular  Vitals Entered By: Orlan Leavens RMA (December 21, 2009 10:08 AM)  O2 Flow:  Room air CC: (R) eye swollem x's 2 days Is Patient Diabetic? No Pain Assessment Patient in pain? no      Comments Pt states no pain but feels heavy   Primary Care Provider:  Oliver Barre, MD  CC:  (R) eye swollem x's 2 days.  History of Present Illness: here with c/o swelling around right eye > left eye - upper eyelid onset 2 days ago denies precipitating trauma + hx same with prior sinus infection no vision change, no eye pain no headache, no fever +producitive cough and sinus congestion  Clinical Review Panels:  CBC   WBC:  6.5 (10/13/2009)   RBC:  4.20 (10/13/2009)   Hgb:  10.7 (10/13/2009)   Hct:  32.3 (10/13/2009)   Platelets:  168.0 (10/13/2009)   MCV  77.1 (10/13/2009)   MCHC  33.1 (10/13/2009)   RDW  17.3 (10/13/2009)   PMN:  62.0 (10/13/2009)   Lymphs:  25.5 (10/13/2009)   Monos:  5.0 (10/13/2009)   Eosinophils:  6.8 (10/13/2009)   Basophil:  0.7 (10/13/2009)  Complete Metabolic Panel   Glucose:  83 (08/31/2009)   Sodium:  145 (08/31/2009)   Potassium:  4.5 (08/31/2009)   Chloride:  110 (08/31/2009)   CO2:  28 (08/31/2009)   BUN:  15 (08/31/2009)   Creatinine:  0.9 (08/31/2009)   Albumin:  4.1 (08/31/2009)   Total Protein:  6.7 (08/31/2009)   Calcium:  9.2 (08/31/2009)   Total Bili:  0.3 (08/31/2009)   Alk Phos:  89 (08/31/2009)   SGPT (ALT):  39 (08/31/2009)   SGOT (AST):  22 (08/31/2009)   Current Medications (verified): 1)  Benazepril Hcl 20 Mg Tabs (Benazepril Hcl) .... One Daily 2)  Metoprolol Succinate 200 Mg Xr24h-Tab  (Metoprolol Succinate) .... One Daily 3)  Hydrocodone-Acetaminophen 5-500 Mg Tabs (Hydrocodone-Acetaminophen) .... As Needed 4)  Topamax 200 Mg Tabs (Topiramate) .... 2 By Mouth Daily 5)  Gabapentin 300 Mg Caps (Gabapentin) .Marland Kitchen.. 1 By Mouth Three Times A Day 6)  Cetirizine Hcl 10 Mg Tabs (Cetirizine Hcl) .Marland Kitchen.. 1po Once Daily As Needed Allergies 7)  Oxygen .... 2l At Night  Allergies (verified): 1)  ! Codeine 2)  ! Asa 3)  ! 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    Physical Exam  General:  alert and overweight-appearing.  mildly ill Eyes:  upperlid edema R>L with genreal periorbital edema - no conjunctivitis - vision grossly intact with correcetive glasses Ears:  normal pinnae bilaterally, without erythema, swelling, or tenderness to palpation. TMs clear, without effusion, or cerumen impaction. Hearing grossly normal bilaterally  Nose:  boggy turbinates and erythema, right>left  Mouth:  teeth and gums in good repair; mucous membranes moist, without lesions or ulcers. oropharynx clear without exudate, mild erythema.  Lungs:  normal respiratory effort and normal breath sounds.   Heart:  bradycardic, 4/6 syst murmur Neurologic:  alert & oriented X3 and cranial nerves II-XII symetrically intact.  strength normal in all extremities, sensation intact to light touch, and gait normal. speech fluent without dysarthria or aphasia; follows commands with good comprehension.    Impression & Recommendations:  Problem # 1:  SINUSITIS- ACUTE-NOS (ICD-461.9)  hx same - manifest with periorbital edema - no vision changes or pain tx Zpack, depo 120 and  cough suppression as prev done - pt agrees to call if symptoms worse Instructed on treatment. Call if symptoms persist or worsen.   Her updated medication list for this problem includes:    Azithromycin 250 Mg Tabs (Azithromycin) .Marland Kitchen... 2 tabs by mouth today, then 1 by mouth daily starting tomorrow    Promethazine-codeine 6.25-10 Mg/42ml Syrp (Promethazine-codeine) .Marland KitchenMarland KitchenMarland KitchenMarland Kitchen 5 cc by mouth four times a day as needed for cough symptoms  Orders: Prescription Created Electronically [id number]) Depo- Medrol 80mg  (J1040) Depo- Medrol 40mg  (J1030) Admin of Therapeutic Inj  intramuscular or subcutaneous (98119)  Complete Medication List: 1)  Benazepril Hcl 20 Mg Tabs (Benazepril hcl) .... One daily 2)  Metoprolol Succinate 200 Mg Xr24h-tab (Metoprolol succinate) .... One daily 3)  Hydrocodone-acetaminophen 5-500 Mg Tabs (Hydrocodone-acetaminophen) .... As needed 4)  Topamax 200 Mg Tabs (Topiramate) .... 2 by mouth daily 5)  Gabapentin 300 Mg Caps (Gabapentin) .Marland Kitchen.. 1 by mouth three times a day 6)  Cetirizine Hcl 10 Mg Tabs (Cetirizine hcl) .Marland Kitchen.. 1po once daily as needed allergies 7)  Oxygen  .... 2l at night 8)  Azithromycin 250 Mg Tabs (Azithromycin) .... 2 tabs by mouth today, then 1 by mouth daily starting tomorrow 9)  Promethazine-codeine 6.25-10 Mg/23ml Syrp (Promethazine-codeine) .... 5 cc by mouth four times a day as needed for cough symptoms  Patient Instructions: 1)  it was good to see you today. 2)  Zpack and cough syrup for sinus symptoms -also Depo shot today 3)  your prescriptions have been electronically submitted or faxed to your pharmacy. Please take as directed. Contact our office if you believe you're having problems with the medication(s).  4)  Get plenty of rest, drink lots of clear liquids, and use Tylenol or Ibuprofen for fever and comfort. Return in 7-10 days if you're not better:sooner if you're feeling worse. Prescriptions: PROMETHAZINE-CODEINE 6.25-10 MG/5ML SYRP  (PROMETHAZINE-CODEINE) 5 cc by mouth four times a day as needed for cough symptoms  #8oz x 0   Entered and Authorized by:   Newt Lukes MD   Signed by:   Newt Lukes MD on 12/21/2009   Method used:   Printed then faxed to ...       [facility] [address] [phone]* (retail)       [address]       [place], [place]  [address]       Ph: [phone]       Fax: [phone]   RxID:   [id number] AZITHROMYCIN 250 MG TABS (AZITHROMYCIN) 2 tabs by mouth today, then 1 by mouth daily starting tomorrow  #6 x 0   Entered and Authorized by:   Newt Lukes MD   Signed by:   Newt Lukes MD on 12/21/2009   Method used:   Electronically to        [facility] [phone]* (  retail)       [address]       [place], [place]  [address]       Ph: [phone]       Fax: [phone]   RxID:   [id number]    Medication Administration  Injection # 1:    Medication: Depo- Medrol 80mg     Diagnosis: SINUSITIS- ACUTE-NOS (ICD-461.9)    Route: IM    Site: RUOQ gluteus    Exp Date: 04/2012    Lot #: [id number]    Mfr: Pharmacia    Comments: Gve total of 120mg     Patient tolerated injection without complications    Given by: Orlan Leavens RMA (December 21, 2009 10:46 AM)  Injection # 2:    Medication: Depo- Medrol 40mg     Diagnosis: SINUSITIS- ACUTE-NOS (ICD-461.9)  Orders Added: 1)  Est. Patient Level IV [36644] 2)  Prescription Created Electronically [G8553] 3)  Depo- Medrol 80mg  [J1040] 4)  Depo- Medrol 40mg  [J1030] 5)  Admin of Therapeutic Inj  intramuscular or subcutaneous [03474]

## 2010-02-23 NOTE — Procedures (Signed)
Summary: Colonoscopy Report/[facility]  Colonoscopy Report/[facility]   Imported By: Esmeralda Links D'jimraou 03/01/2007 13:33:01  _____________________________________________________________________  External Attachment:    Type:   Image     Comment:   External Document

## 2010-02-23 NOTE — Progress Notes (Signed)
Summary: / possible flu  Phone Note Call from Patient Call back at Home Phone [phone]   Caller: Nursing Home Call For: dr Jonny Ruiz Reason for Call: Acute Illness Complaint: Headache Summary of Call: per Silas [place] call have Symptom  of the flu c/o headaches temp 100.101 sore throat congestion head upper chest ,want to know if she can get TAMIFLU Initial call taken by: Shelbie Proctor,  November 12, 2007 8:20 AM  Follow-up for Phone Call        seen today in office Follow-up by: [name] MD,  November 12, 2007 12:45 PM

## 2010-02-23 NOTE — Assessment & Plan Note (Signed)
Summary: SORE THROAT-COUGH-CONGESTION-STC   Vital Signs:  Patient Profile:   60 Years Old Female Weight:      165 pounds Temp:     98.1 degrees F Pulse rate:   67 / minute BP sitting:   154 / 79  (right arm) Cuff size:   regular  Pt. in pain?   no  Vitals Entered By: Elveria Royals (January 11, 2007 3:51 PM)                  Chief Complaint:  sinus trouble.  History of Present Illness: here with acute onset prod cough, st and wheezing, sob (mild) without cp, palp or syncope  Current Allergies (reviewed today): ! CODEINE Updated/Current Medications (including changes made in today's visit):  BENAZEPRIL HCL 40 MG TABS (BENAZEPRIL HCL) Take 1 tablet by mouth once a day SINGULAIR 10 MG TABS (MONTELUKAST SODIUM) Take 1 tablet by mouth once a day ZYRTEC ALLERGY 10 MG TABS (CETIRIZINE HCL) Take 1 tablet by mouth once a day METOPROLOL SUCCINATE 100 MG  TB24 (METOPROLOL SUCCINATE) 1 and 1/2 by mouth qd DOXYCYCLINE HYCLATE 100 MG  TABS (DOXYCYCLINE HYCLATE) 1 by mouth bid PROMETHAZINE-CODEINE 6.25-10 MG/5ML  SYRP (PROMETHAZINE-CODEINE) 1 tsp by mouth qid prn PREDNISONE 20 MG  TABS (PREDNISONE) 2 by mouth once daily for 3 days, then 1 by mouth once daily for 3 days, then 1/2 by mouth once daily for 4 days, then stop NYSTATIN 100000 UNIT/ML  SUSP (NYSTATIN) 5 cc by mouth qid swish and spit for 10 days prn   Past Medical History:    Reviewed history from 12/07/2006 and no changes required:       Hyperlipidemia       Hypertension       Sickle Cell Anemia       Hypertrophic Cardiomyopathy - Left Ventricle       Borderline Diabetes       Asthma       Obesity       Anxiety       Depression       Congestive heart failure       Colonic polyps, hx of       Allergic rhinitis  Past Surgical History:    Reviewed history from 12/07/2006 and no changes required:       Hysterectomy - 1987       s/p ventral hernia repair - 6/08   Family History:    Reviewed history from  12/07/2006 and no changes required:       adopted - no hx available except:       daughter with colon cancer       daughter with bleeding disorder  Social History:    Reviewed history from 12/07/2006 and no changes required:       Former Smoker       Alcohol use-no     Physical Exam  General:     mild ill Head:     Normocephalic and atraumatic without obvious abnormalities. No apparent alopecia or balding. Eyes:     No corneal or conjunctival inflammation noted. EOMI. Perrla. Ears:     bilat tm's red Nose:     nasal dischargemucosal pallor.   Mouth:     pharyngeal erythema.  + thrush to the tongue Neck:     cervical lymphadenopathy.   Lungs:     R decreased breath sounds, R wheezes, L decreased breath sounds, and L wheezes.   Heart:  Normal rate and regular rhythm. S1 and S2 normal without gallop, murmur, click, rub or other extra sounds. Extremities:     no edema    Impression & Recommendations:  Problem # 1:  BRONCHITIS-ACUTE (ICD-466.0)  The following medications were removed from the medication list:    Ceftin 250 Mg Tabs (Cefuroxime axetil) .Marland Kitchen... 1 by mouth bid  Her updated medication list for this problem includes:    Singulair 10 Mg Tabs (Montelukast sodium) .Marland Kitchen... Take 1 tablet by mouth once a day    Doxycycline Hyclate 100 Mg Tabs (Doxycycline hyclate) .Marland Kitchen... 1 by mouth bid    Promethazine-codeine 6.25-10 Mg/88m Syrp (Promethazine-codeine) ..Marland Kitchen.. 1 tsp by mouth qid prn  with moderate asthmatic component - tx as above and prednisone taper; has inhaler at home; also gave depomedrol 120 mg IM x 1 today  Orders: Admin of Therapeutic Inj  intramuscular or subcutaneous ([id number] Depo- Medrol '40mg'$  (J1030)   Problem # 2:  THRUSH (ICD-112.0) tx with nystatin susp prn  Problem # 3:  HYPERTENSION (ICD-401.9)  Her updated medication list for this problem includes:    Benazepril Hcl 40 Mg Tabs (Benazepril hcl) ..Marland Kitchen.. Take 1 tablet by mouth once a day     Metoprolol Succinate 100 Mg Tb24 (Metoprolol succinate) ..Marland Kitchen.. 1 and 1/2 by mouth qd  stable, cont meds as is  Complete Medication List: 1)  Benazepril Hcl 40 Mg Tabs (Benazepril hcl) .... Take 1 tablet by mouth once a day 2)  Singulair 10 Mg Tabs (Montelukast sodium) .... Take 1 tablet by mouth once a day 3)  Zyrtec Allergy 10 Mg Tabs (Cetirizine hcl) .... Take 1 tablet by mouth once a day 4)  Metoprolol Succinate 100 Mg Tb24 (Metoprolol succinate) ..Marland Kitchen. 1 and 1/2 by mouth qd 5)  Doxycycline Hyclate 100 Mg Tabs (Doxycycline hyclate) ..Marland Kitchen. 1 by mouth bid 6)  Promethazine-codeine 6.25-10 Mg/575mSyrp (Promethazine-codeine) ...Marland Kitchen 1 tsp by mouth qid prn 7)  Prednisone 20 Mg Tabs (Prednisone) .... 2 by mouth once daily for 3 days, then 1 by mouth once daily for 3 days, then 1/2 by mouth once daily for 4 days, then stop 8)  Nystatin 100000 Unit/ml Susp (Nystatin) .... 5 cc by mouth qid swish and spit for 10 days prn   Patient Instructions: 1)  Take all medications as prescribed 2)  Please schedule a follow-up appointment as needed.    Prescriptions: NYSTATIN 100000 UNIT/ML  SUSP (NYSTATIN) 5 cc by mouth qid swish and spit for 10 days prn  #1 bottle x 1   Entered and Authorized by:   JaBiagio BorgD   Signed by:   JaBiagio BorgD on 01/11/2007   Method used:   Print then Give to Patient   RxID:   [id number] 20 MG  TABS (PREDNISONE) 2 by mouth once daily for 3 days, then 1 by mouth once daily for 3 days, then 1/2 by mouth once daily for 4 days, then stop  #11 x 1   Entered and Authorized by:   JaBiagio BorgD   Signed by:   JaBiagio BorgD on 01/11/2007   Method used:   Print then Give to Patient   RxID:   [id number].[id number] MG/5ML  SYRP (PROMETHAZINE-CODEINE) 1 tsp by mouth qid prn  #8 oz x 1   Entered and Authorized by:   JaBiagio BorgD   Signed by:   JaBiagio BorgD on 01/11/2007   Method used:  Print then Give to Patient   RxID:    [id number] DOXYCYCLINE HYCLATE 100 MG  TABS (DOXYCYCLINE HYCLATE) 1 by mouth bid  #20 x 0   Entered and Authorized by:   Biagio Borg MD   Signed by:   Biagio Borg MD on 01/11/2007   Method used:   Print then Give to Patient   RxID:   [id number]  ]  Medication Administration  Injection # 1:    Medication: Depo- Medrol '40mg'$     Diagnosis: BRONCHITIS-ACUTE (ICD-466.0)    Route: IM    Site: RUOQ gluteus    Exp Date: 01/24/2009    Lot #: [id number]    Mfr: [email or web address]    Patient tolerated injection without complications    Given by: Elveria Royals (January 11, 2007 4:21 PM)  Orders Added: 1)  Admin of Therapeutic Inj  intramuscular or subcutaneous [90772] 2)  Depo- Medrol '40mg'$  [J1030] 3)  Est. Patient Level IV [id number]

## 2010-02-25 NOTE — Miscellaneous (Signed)
Clinical Lists Changes  Observations: Added new observation of EVENTFIND: - Left ventricle: The cavity size was normal. Asymmetric septal     hypertrophy. Systolic function was normal. The estimated ejection     fraction was in the range of 60% to 65%. Highest LV outflow tract     gradient measured was 55 mmHg. This is lower than on the prior     study. Although no diagnostic regional wall motion abnormality was     identified, this possibility cannot be completely excluded on the     basis of this study. Features are consistent with a pseudonormal     left ventricular filling pattern, with concomitant abnormal     relaxation and increased filling pressure (grade 2 diastolic     dysfunction).   - Aortic valve: There was no stenosis.   - Mitral valve: There was systolic anterior motion of the mitral     valve. Probably mild eccentric, posteriorly-directedregurgitation.     Valve area by pressure half-time: 0.53cm^2.   - Left atrium: The atrium was mildly dilated.   - Right ventricle: The cavity size was normal. Systolic function was     normal.   - Tricuspid valve: No complete TR doppler jet so unable to estimate     PA systolic pressure.   - Systemic veins: IVC measured 1.9 cm with normal respirophasic     variation, suggesting RA pressure 6-10 mmHg.   Impressions:    - Hypertrophic cardiomyopathy with asymmetric septal hypertrophy and     systolic anterior motion of the mitral valve. There is an LV     outflow tract gradient but not as high as on the prior echo.     Normal RV size and systolic function. (01/27/2010 13:49) Added new observation of CARDCATHFIND: IMPRESSION: 1. Mild nonobstructive coronary artery disease. 2. Hypertrophic obstructive cardiomyopathy. 3. Preserved left ventricular systolic function.   RECOMMENDATIONS:  We will continue medical management at this time.  I will discuss the case with Dr. Percival Spanish and we will make further plans for evaluation at [facility] for possible alcohol septal ablation. (01/27/2010 13:49)      Cardiac Cath  Procedure date:  01/27/2010  Findings:      IMPRESSION: 1. Mild nonobstructive coronary artery disease. 2. Hypertrophic obstructive cardiomyopathy. 3. Preserved left ventricular systolic function.   RECOMMENDATIONS:  We will continue medical management at this time.  I will discuss the case with Dr. Percival Spanish and we will make further plans for evaluation at [facility] for possible alcohol septal ablation.  Event Monitor  Procedure date:  01/27/2010  Findings:      - Left ventricle: The cavity size was normal. Asymmetric septal     hypertrophy. Systolic function was normal. The estimated ejection     fraction was in the range of 60% to 65%. Highest LV outflow tract     gradient measured was 55 mmHg. This is lower than on the prior     study. Although no diagnostic regional wall motion abnormality was     identified, this possibility cannot be completely excluded on the     basis of this study. Features are consistent with a pseudonormal     left ventricular filling pattern, with concomitant abnormal     relaxation and increased filling pressure (grade 2 diastolic     dysfunction).   - Aortic valve: There was no stenosis.   - Mitral valve: There was systolic anterior motion of the  mitral     valve. Probably mild eccentric, posteriorly-directedregurgitation.     Valve area by pressure half-time: 0.53cm^2.   - Left atrium: The atrium was mildly dilated.   - Right ventricle: The cavity size was normal. Systolic function was     normal.   - Tricuspid valve: No complete TR doppler jet so unable to estimate     PA systolic pressure.   - Systemic veins: IVC measured 1.9 cm with normal respirophasic     variation, suggesting RA pressure 6-10 mmHg.   Impressions:    - Hypertrophic cardiomyopathy with asymmetric septal hypertrophy and     systolic anterior  motion of the mitral valve. There is an LV     outflow tract gradient but not as high as on the prior echo.     Normal RV size and systolic function.

## 2010-02-25 NOTE — Assessment & Plan Note (Signed)
Summary: eph  Medications Added SIMVASTATIN 20 MG TABS (SIMVASTATIN) 1 tab daily      Allergies Added:   Visit Type:  Follow-up- eph Referring Provider:  Dr Darrow Bussing Primary Provider:  Oliver Barre, MD  CC:  fatigue.  History of Present Illness: The patient presents for followup after a syncopal episode. She was hospitalized for this. Telemetry over those few days demonstrated no arrhythmias. Neurologic workup including head CT was unremarkable. She did have some chest discomfort. Cardiac catheterization demonstrated minimal coronary plaque. She did have a significant LV outflow tract gradient related to her hypertrophic cardiomyopathy.  (Central aortic pressure 140/83.  Left ventricularpressure 267/14.  Left ventricular end-diastolic pressure 22.)  Since that catheterization she has had no further presyncope or syncope. She denies any palpitations. She has had no chest pressure, neck or arm discomfort. She has had no weight gain or edema. She denies any new shortness of breath, PND or orthopnea.  Current Medications (verified): 1)  Amlodipine Besylate 5 Mg Tabs (Amlodipine Besylate) .Marland Kitchen.. 1po Once Daily 2)  Metoprolol Succinate 200 Mg Xr24h-Tab (Metoprolol Succinate) .... One Daily 3)  Hydrocodone-Acetaminophen 5-500 Mg Tabs (Hydrocodone-Acetaminophen) .... As Needed 4)  Topamax 200 Mg Tabs (Topiramate) .... 2 By Mouth Daily 5)  Gabapentin 300 Mg Caps (Gabapentin) .Marland Kitchen.. 1 By Mouth Three Times A Day 6)  Cetirizine Hcl 10 Mg Tabs (Cetirizine Hcl) .Marland Kitchen.. 1po Once Daily As Needed Allergies 7)  Oxygen .... 2l At Night 8)  Promethazine-Codeine 6.25-10 Mg/34ml Syrp (Promethazine-Codeine) .... 5 Cc By Mouth Four Times A Day As Needed For Cough Symptoms 9)  Simvastatin 20 Mg Tabs (Simvastatin) .Marland Kitchen.. 1 Tab Daily  Allergies (verified): 1)  ! Codeine 2)  ! Asa 3)  ! Ace Inhibitors 4)  ! * Soy  Past History:  Past Medical History: Hyperlipidemia Hypertension Sickle Cell  Anemia Hypertrophic Cardiomyopathy  Borderline Diabetes Seasonal Asthma Obesity Anxiety Depression Congestive heart failure Colonic polyps, hx Allergic rhinitis Asymptomatic bradycardia  Past Surgical History: Reviewed history from 10/20/2009 and no changes required. Hysterectomy - 1987 Ventral hernia repair - 6/08  Review of Systems       As stated in the HPI and negative for all other systems.   Vital Signs:  Patient profile:   60 year old female Height:      69 inches Weight:      166 pounds Pulse rate:   58 / minute BP sitting:   147 / 78  (left arm)  Vitals Entered By: Burnett Kanaris, CNA (February 11, 2010 3:24 PM)  Physical Exam  General:  Well developed, well nourished, in no acute distress. Head:  normocephalic and atraumatic Eyes:  PERRLA/EOM intact; conjunctiva and lids normal. Neck:  Neck supple, no JVD. No masses, thyromegaly or abnormal cervical nodes. Chest Wall:  no deformities or breast masses noted Lungs:  Clear bilaterally to auscultation and percussion. Abdomen:  Bowel sounds positive; abdomen soft and non-tender without masses, organomegaly, or hernias noted. No hepatosplenomegaly. Msk:  Back normal, normal gait. Muscle strength and tone normal. Extremities:  No clubbing or cyanosis. Neurologic:  Alert and oriented x 3. Skin:  Intact without lesions or rashes. Cervical Nodes:  no significant adenopathy Inguinal Nodes:  no significant adenopathy Psych:  Normal affect.   Detailed Cardiovascular Exam  Neck    Carotids: Carotids full and equal bilaterally without bruits.      Neck Veins: Normal, no JVD.    Heart    Inspection: no deformities or lifts noted.  Palpation: normal PMI with no thrills palpable.      Auscultation: regular rate and rhythm, S1, S2 with 3/6 apical systolic murmur radiating out the aortic outflow tract, increased with the strain phase of Valsalva, no diastolic murmurs, no clicks, no rubs, murmurs transmitted to the  carotids  Vascular    Abdominal Aorta: no palpable masses, pulsations, or audible bruits.      Femoral Pulses: normal femoral pulses bilaterally.      Pedal Pulses: normal pedal pulses bilaterally.      Radial Pulses: normal radial pulses bilaterally.      Peripheral Circulation: no clubbing, cyanosis, or edema noted with normal capillary refill.     EKG  Procedure date:  02/11/2010  Findings:      Sinus bradycardia, left atrial enlargement, left ventricular hypertrophy with repolarization changes  Impression & Recommendations:  Problem # 1:  CARDIOMYOPATHY, HYPERTROPHIC, OBSTRUCTIVE (ICD-425.1) The patient has some high risk features with a syncopal episode and inability to raise her blood pressure during exercise treadmill testing. Because of this I will refer her to Dr. Nolon Rod at [facility] at the [facility] for consideration of further therapy.  At present she does not have overt symptoms such as pain or dyspnea. Orders: Misc. Referral (Misc. Ref)  Problem # 2:  HYPERTENSION (ICD-401.9) Her blood pressure is well controlled and she will continue on the meds as listed.  Problem # 3:  BRADYCARDIA (ICD-427.89) She has had no symptomatic bradycardia arrhythmias or sustained bradycardia arrhythmias or prolonged pauses while in hospital. She is tolerating the meds as listed. Orders: EKG w/ Interpretation (93000)  Patient Instructions: 1)  Your physician recommends that you schedule a follow-up appointment in: 6 months with Dr Antoine Poche 2)  Your physician recommends that you continue on your current medications as directed. Please refer to the Current Medication list given to you today. 3)  You have been referred to Dr Regino Schultze at [facility].  You will be contacted
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
## 2010-03-28 ENCOUNTER — Emergency Department (HOSPITAL_COMMUNITY)
Admission: EM | Admit: 2010-03-28 | Discharge: 2010-03-28 | Disposition: A | Discharge status: Home / Self Care | Payer: BC Managed Care – PPO | Attending: Emergency Medicine | Admitting: Emergency Medicine

## 2010-03-28 DIAGNOSIS — R51 Headache: Secondary | ICD-10-CM | POA: Insufficient documentation

## 2010-03-28 DIAGNOSIS — R220 Localized swelling, mass and lump, head: Secondary | ICD-10-CM | POA: Insufficient documentation

## 2010-03-28 DIAGNOSIS — T7840XA Allergy, unspecified, initial encounter: Secondary | ICD-10-CM | POA: Insufficient documentation

## 2010-03-28 DIAGNOSIS — I10 Essential (primary) hypertension: Secondary | ICD-10-CM | POA: Insufficient documentation

## 2010-03-28 DIAGNOSIS — X58XXXA Exposure to other specified factors, initial encounter: Secondary | ICD-10-CM | POA: Insufficient documentation

## 2010-04-01 ENCOUNTER — Encounter: Payer: Self-pay | Admitting: Internal Medicine

## 2010-04-01 ENCOUNTER — Ambulatory Visit (INDEPENDENT_AMBULATORY_CARE_PROVIDER_SITE_OTHER): Payer: BC Managed Care – PPO | Admitting: Internal Medicine

## 2010-04-01 DIAGNOSIS — I10 Essential (primary) hypertension: Secondary | ICD-10-CM

## 2010-04-01 DIAGNOSIS — I509 Heart failure, unspecified: Secondary | ICD-10-CM

## 2010-04-01 DIAGNOSIS — R062 Wheezing: Secondary | ICD-10-CM

## 2010-04-01 DIAGNOSIS — J019 Acute sinusitis, unspecified: Secondary | ICD-10-CM

## 2010-04-05 LAB — COMPREHENSIVE METABOLIC PANEL
ALT: 13 U/L (ref 0–35)
AST: 15 U/L (ref 0–37)
Albumin: 3.4 g/dL — ABNORMAL LOW (ref 3.5–5.2)
Alkaline Phosphatase: 72 U/L (ref 39–117)
BUN: 19 mg/dL (ref 6–23)
CO2: 24 mEq/L (ref 19–32)
Calcium: 9.3 mg/dL (ref 8.4–10.5)
Chloride: 112 mEq/L (ref 96–112)
Creatinine, Ser: 0.76 mg/dL (ref 0.4–1.2)
GFR calc Af Amer: >60 mL/min (ref >60)
GFR calc non Af Amer: >60 mL/min (ref >60)
Glucose, Bld: 107 mg/dL — ABNORMAL HIGH (ref 70–99)
Potassium: 3.9 mEq/L (ref 3.5–5.1)
Sodium: 143 mEq/L (ref 135–145)
Total Bilirubin: 0.3 mg/dL (ref 0.3–1.2)
Total Protein: 6.1 g/dL (ref 6.0–8.3)

## 2010-04-05 LAB — TROPONIN I
Troponin I: 0.05 ng/mL (ref 0.00–0.06)
Troponin I: 0.05 ng/mL (ref 0.00–0.06)
Troponin I: 0.08 ng/mL — ABNORMAL HIGH (ref 0.00–0.06)

## 2010-04-05 LAB — CBC
HCT: 40.4 % (ref 36.0–46.0)
Hemoglobin: 13.2 g/dL (ref 12.0–15.0)
MCH: 24.9 pg — ABNORMAL LOW (ref 26.0–34.0)
MCHC: 32.7 g/dL (ref 30.0–36.0)
MCV: 76.2 fL — ABNORMAL LOW (ref 78.0–100.0)
Platelets: 202 10*3/uL (ref 150–400)
RBC: 5.3 MIL/uL — ABNORMAL HIGH (ref 3.87–5.11)
RDW: 16.5 % — ABNORMAL HIGH (ref 11.5–15.5)
WBC: 10.4 10*3/uL (ref 4.0–10.5)

## 2010-04-05 LAB — DIFFERENTIAL
Basophils Absolute: 0 10*3/uL (ref 0.0–0.1)
Basophils Relative: 0 % (ref 0–1)
Eosinophils Absolute: 0.2 10*3/uL (ref 0.0–0.7)
Eosinophils Relative: 2 % (ref 0–5)
Lymphocytes Relative: 19 % (ref 12–46)
Lymphs Abs: 1.9 10*3/uL (ref 0.7–4.0)
Monocytes Absolute: 0.6 10*3/uL (ref 0.1–1.0)
Monocytes Relative: 6 % (ref 3–12)
Neutro Abs: 7.7 10*3/uL (ref 1.7–7.7)
Neutrophils Relative %: 74 % (ref 43–77)

## 2010-04-05 LAB — MAGNESIUM: Magnesium: 2.2 mg/dL (ref 1.5–2.5)

## 2010-04-05 LAB — BASIC METABOLIC PANEL
BUN: 18 mg/dL (ref 6–23)
CO2: 27 mEq/L (ref 19–32)
Calcium: 9.7 mg/dL (ref 8.4–10.5)
Chloride: 112 mEq/L (ref 96–112)
Creatinine, Ser: 0.73 mg/dL (ref 0.4–1.2)
GFR calc Af Amer: >60 mL/min (ref >60)
GFR calc non Af Amer: >60 mL/min (ref >60)
Glucose, Bld: 118 mg/dL — ABNORMAL HIGH (ref 70–99)
Potassium: 4.1 mEq/L (ref 3.5–5.1)
Sodium: 144 mEq/L (ref 135–145)

## 2010-04-05 LAB — URINALYSIS, ROUTINE W REFLEX MICROSCOPIC
Bilirubin Urine: NEGATIVE
Glucose, UA: NEGATIVE mg/dL
Hgb urine dipstick: NEGATIVE
Ketones, ur: NEGATIVE mg/dL
Nitrite: NEGATIVE
Protein, ur: NEGATIVE mg/dL
Specific Gravity, Urine: 1.016 (ref 1.005–1.030)
Urobilinogen, UA: 0.2 mg/dL (ref 0.0–1.0)
pH: 5.5 (ref 5.0–8.0)

## 2010-04-05 LAB — PROTIME-INR
INR: 1.21 (ref 0.00–1.49)
Prothrombin Time: 15.5 seconds — ABNORMAL HIGH (ref 11.6–15.2)

## 2010-04-05 LAB — POCT CARDIAC MARKERS
CKMB, poc: 3.3 ng/mL (ref 1.0–8.0)
Myoglobin, poc: 54.4 ng/mL (ref 12–200)
Troponin i, poc: <0.05 ng/mL (ref 0.00–0.09)

## 2010-04-05 LAB — CK TOTAL AND CKMB (NOT AT ARMC)
CK, MB: 4.3 ng/mL — ABNORMAL HIGH (ref 0.3–4.0)
CK, MB: 4.4 ng/mL — ABNORMAL HIGH (ref 0.3–4.0)
CK, MB: 4.4 ng/mL — ABNORMAL HIGH (ref 0.3–4.0)
Relative Index: INVALID (ref 0.0–2.5)
Relative Index: INVALID (ref 0.0–2.5)
Relative Index: INVALID (ref 0.0–2.5)
Total CK: 43 U/L (ref 7–177)
Total CK: 46 U/L (ref 7–177)
Total CK: 62 U/L (ref 7–177)

## 2010-04-05 LAB — BRAIN NATRIURETIC PEPTIDE: Pro B Natriuretic peptide (BNP): 983 pg/mL — ABNORMAL HIGH (ref 0.0–100.0)

## 2010-04-05 LAB — LIPID PANEL
Cholesterol: 252 mg/dL — ABNORMAL HIGH (ref 0–200)
HDL: 52 mg/dL (ref >39)
LDL Cholesterol: 141 mg/dL — ABNORMAL HIGH (ref 0–99)
Total CHOL/HDL Ratio: 4.8 RATIO
Triglycerides: 295 mg/dL — ABNORMAL HIGH (ref <150)
VLDL: 59 mg/dL — ABNORMAL HIGH (ref 0–40)

## 2010-04-05 LAB — HEMOGLOBIN A1C
Hgb A1c MFr Bld: 6.2 % — ABNORMAL HIGH (ref <5.7)
Mean Plasma Glucose: 131 mg/dL — ABNORMAL HIGH (ref <117)

## 2010-04-05 LAB — PHOSPHORUS: Phosphorus: 4.7 mg/dL — ABNORMAL HIGH (ref 2.3–4.6)

## 2010-04-05 LAB — APTT: aPTT: 33 seconds (ref 24–37)

## 2010-04-06 ENCOUNTER — Other Ambulatory Visit: Payer: Self-pay | Admitting: Physician Assistant

## 2010-04-06 ENCOUNTER — Encounter: Payer: Self-pay | Admitting: Physician Assistant

## 2010-04-06 ENCOUNTER — Telehealth: Payer: Self-pay | Admitting: Internal Medicine

## 2010-04-06 ENCOUNTER — Other Ambulatory Visit: Payer: BC Managed Care – PPO

## 2010-04-06 ENCOUNTER — Ambulatory Visit (INDEPENDENT_AMBULATORY_CARE_PROVIDER_SITE_OTHER): Payer: BC Managed Care – PPO | Admitting: Physician Assistant

## 2010-04-06 DIAGNOSIS — I5032 Chronic diastolic (congestive) heart failure: Secondary | ICD-10-CM | POA: Insufficient documentation

## 2010-04-06 DIAGNOSIS — K5730 Diverticulosis of large intestine without perforation or abscess without bleeding: Secondary | ICD-10-CM | POA: Insufficient documentation

## 2010-04-06 DIAGNOSIS — K5790 Diverticulosis of intestine, part unspecified, without perforation or abscess without bleeding: Secondary | ICD-10-CM | POA: Insufficient documentation

## 2010-04-06 DIAGNOSIS — K625 Hemorrhage of anus and rectum: Secondary | ICD-10-CM

## 2010-04-06 DIAGNOSIS — I5030 Unspecified diastolic (congestive) heart failure: Secondary | ICD-10-CM | POA: Insufficient documentation

## 2010-04-06 DIAGNOSIS — K5731 Diverticulosis of large intestine without perforation or abscess with bleeding: Secondary | ICD-10-CM

## 2010-04-06 HISTORY — DX: Chronic diastolic (congestive) heart failure: I50.32

## 2010-04-06 HISTORY — DX: Diverticulosis of large intestine without perforation or abscess with bleeding: K57.31

## 2010-04-06 LAB — CBC WITH DIFFERENTIAL/PLATELET
Basophils Absolute: 0 10*3/uL (ref 0.0–0.1)
Basophils Relative: 0.2 % (ref 0.0–3.0)
Eosinophils Absolute: 0 10*3/uL (ref 0.0–0.7)
Eosinophils Relative: 0.2 % (ref 0.0–5.0)
HCT: 38 % (ref 36.0–46.0)
Hemoglobin: 12.6 g/dL (ref 12.0–15.0)
Lymphocytes Relative: 10.1 % — ABNORMAL LOW (ref 12.0–46.0)
Lymphs Abs: 1 10*3/uL (ref 0.7–4.0)
MCHC: 33 g/dL (ref 30.0–36.0)
MCV: 77.7 fl — ABNORMAL LOW (ref 78.0–100.0)
Monocytes Absolute: 0.2 10*3/uL (ref 0.1–1.0)
Monocytes Relative: 2 % — ABNORMAL LOW (ref 3.0–12.0)
Neutro Abs: 8.9 10*3/uL — ABNORMAL HIGH (ref 1.4–7.7)
Neutrophils Relative %: 87.5 % — ABNORMAL HIGH (ref 43.0–77.0)
Platelets: 228 10*3/uL (ref 150.0–400.0)
RBC: 4.9 Mil/uL (ref 3.87–5.11)
RDW: 18 % — ABNORMAL HIGH (ref 11.5–14.6)
WBC: 10.1 10*3/uL (ref 4.5–10.5)

## 2010-04-06 LAB — BASIC METABOLIC PANEL
BUN: 16 mg/dL (ref 6–23)
CO2: 24 mEq/L (ref 19–32)
Calcium: 9.5 mg/dL (ref 8.4–10.5)
Chloride: 108 mEq/L (ref 96–112)
Creatinine, Ser: 0.7 mg/dL (ref 0.4–1.2)
GFR: 106.74 mL/min (ref >60.00)
Glucose, Bld: 159 mg/dL — ABNORMAL HIGH (ref 70–99)
Potassium: 4 mEq/L (ref 3.5–5.1)
Sodium: 141 mEq/L (ref 135–145)
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
## 2010-04-08 ENCOUNTER — Other Ambulatory Visit: Payer: Self-pay | Admitting: Physician Assistant

## 2010-04-08 ENCOUNTER — Encounter (INDEPENDENT_AMBULATORY_CARE_PROVIDER_SITE_OTHER): Payer: Self-pay | Admitting: *Deleted

## 2010-04-08 ENCOUNTER — Other Ambulatory Visit: Payer: BC Managed Care – PPO

## 2010-04-08 ENCOUNTER — Encounter: Payer: Self-pay | Admitting: Physician Assistant

## 2010-04-08 DIAGNOSIS — K625 Hemorrhage of anus and rectum: Secondary | ICD-10-CM

## 2010-04-08 LAB — CBC
HCT: 32.6 % — ABNORMAL LOW (ref 36.0–46.0)
HCT: 33.7 % — ABNORMAL LOW (ref 36.0–46.0)
HCT: 38.9 % (ref 36.0–46.0)
Hemoglobin: 10.7 g/dL — ABNORMAL LOW (ref 12.0–15.0)
Hemoglobin: 11.1 g/dL — ABNORMAL LOW (ref 12.0–15.0)
Hemoglobin: 12.7 g/dL (ref 12.0–15.0)
MCH: 25.3 pg — ABNORMAL LOW (ref 26.0–34.0)
MCH: 25.4 pg — ABNORMAL LOW (ref 26.0–34.0)
MCH: 25.4 pg — ABNORMAL LOW (ref 26.0–34.0)
MCHC: 32.6 g/dL (ref 30.0–36.0)
MCHC: 32.7 g/dL (ref 30.0–36.0)
MCHC: 33 g/dL (ref 30.0–36.0)
MCV: 76.8 fL — ABNORMAL LOW (ref 78.0–100.0)
MCV: 77.6 fL — ABNORMAL LOW (ref 78.0–100.0)
MCV: 77.9 fL — ABNORMAL LOW (ref 78.0–100.0)
Platelets: 160 10*3/uL (ref 150–400)
Platelets: 184 10*3/uL (ref 150–400)
Platelets: 199 10*3/uL (ref 150–400)
RBC: 4.2 MIL/uL (ref 3.87–5.11)
RBC: 4.39 MIL/uL (ref 3.87–5.11)
RBC: 4.99 MIL/uL (ref 3.87–5.11)
RDW: 15.6 % — ABNORMAL HIGH (ref 11.5–15.5)
RDW: 16.9 % — ABNORMAL HIGH (ref 11.5–15.5)
RDW: 17.1 % — ABNORMAL HIGH (ref 11.5–15.5)
WBC: 5.9 10*3/uL (ref 4.0–10.5)
WBC: 7.3 10*3/uL (ref 4.0–10.5)
WBC: 8.1 10*3/uL (ref 4.0–10.5)

## 2010-04-08 LAB — COMPREHENSIVE METABOLIC PANEL
ALT: 13 U/L (ref 0–35)
AST: 13 U/L (ref 0–37)
Albumin: 3.7 g/dL (ref 3.5–5.2)
Alkaline Phosphatase: 83 U/L (ref 39–117)
BUN: 10 mg/dL (ref 6–23)
CO2: 24 mEq/L (ref 19–32)
Calcium: 8.9 mg/dL (ref 8.4–10.5)
Chloride: 113 mEq/L — ABNORMAL HIGH (ref 96–112)
Creatinine, Ser: 0.76 mg/dL (ref 0.4–1.2)
GFR calc Af Amer: >60 mL/min (ref >60)
GFR calc non Af Amer: >60 mL/min (ref >60)
Glucose, Bld: 84 mg/dL (ref 70–99)
Potassium: 3.6 mEq/L (ref 3.5–5.1)
Sodium: 141 mEq/L (ref 135–145)
Total Bilirubin: 0.6 mg/dL (ref 0.3–1.2)
Total Protein: 6.7 g/dL (ref 6.0–8.3)

## 2010-04-08 LAB — CBC WITH DIFFERENTIAL/PLATELET
Basophils Absolute: 0.1 10*3/uL (ref 0.0–0.1)
Basophils Relative: 0.5 % (ref 0.0–3.0)
Eosinophils Absolute: 0.1 10*3/uL (ref 0.0–0.7)
Eosinophils Relative: 1.3 % (ref 0.0–5.0)
HCT: 36.2 % (ref 36.0–46.0)
Hemoglobin: 11.9 g/dL — ABNORMAL LOW (ref 12.0–15.0)
Lymphocytes Relative: 21.1 % (ref 12.0–46.0)
Lymphs Abs: 2.2 10*3/uL (ref 0.7–4.0)
MCHC: 32.9 g/dL (ref 30.0–36.0)
MCV: 78.3 fl (ref 78.0–100.0)
Monocytes Absolute: 0.6 10*3/uL (ref 0.1–1.0)
Monocytes Relative: 5.5 % (ref 3.0–12.0)
Neutro Abs: 7.4 10*3/uL (ref 1.4–7.7)
Neutrophils Relative %: 71.6 % (ref 43.0–77.0)
Platelets: 207 10*3/uL (ref 150.0–400.0)
RBC: 4.63 Mil/uL (ref 3.87–5.11)
RDW: 18.1 % — ABNORMAL HIGH (ref 11.5–14.6)
WBC: 10.4 10*3/uL (ref 4.5–10.5)

## 2010-04-08 LAB — BASIC METABOLIC PANEL
BUN: 14 mg/dL (ref 6–23)
BUN: 7 mg/dL (ref 6–23)
CO2: 23 mEq/L (ref 19–32)
CO2: 25 mEq/L (ref 19–32)
Calcium: 9 mg/dL (ref 8.4–10.5)
Calcium: 9.1 mg/dL (ref 8.4–10.5)
Chloride: 114 mEq/L — ABNORMAL HIGH (ref 96–112)
Chloride: 117 mEq/L — ABNORMAL HIGH (ref 96–112)
Creatinine, Ser: 0.65 mg/dL (ref 0.4–1.2)
Creatinine, Ser: 0.74 mg/dL (ref 0.4–1.2)
GFR calc Af Amer: >60 mL/min (ref >60)
GFR calc Af Amer: >60 mL/min (ref >60)
GFR calc non Af Amer: >60 mL/min (ref >60)
GFR calc non Af Amer: >60 mL/min (ref >60)
Glucose, Bld: 74 mg/dL (ref 70–99)
Glucose, Bld: 93 mg/dL (ref 70–99)
Potassium: 3.6 mEq/L (ref 3.5–5.1)
Potassium: 4 mEq/L (ref 3.5–5.1)
Sodium: 143 mEq/L (ref 135–145)
Sodium: 144 mEq/L (ref 135–145)

## 2010-04-08 LAB — DIFFERENTIAL
Basophils Absolute: 0 10*3/uL (ref 0.0–0.1)
Basophils Relative: 1 % (ref 0–1)
Eosinophils Absolute: 0.6 10*3/uL (ref 0.0–0.7)
Eosinophils Relative: 8 % — ABNORMAL HIGH (ref 0–5)
Lymphocytes Relative: 27 % (ref 12–46)
Lymphs Abs: 2.2 10*3/uL (ref 0.7–4.0)
Monocytes Absolute: 0.4 10*3/uL (ref 0.1–1.0)
Monocytes Relative: 5 % (ref 3–12)
Neutro Abs: 4.9 10*3/uL (ref 1.7–7.7)
Neutrophils Relative %: 60 % (ref 43–77)

## 2010-04-08 LAB — CARDIAC PANEL(CRET KIN+CKTOT+MB+TROPI)
CK, MB: 3 ng/mL (ref 0.3–4.0)
CK, MB: 3.7 ng/mL (ref 0.3–4.0)
CK, MB: 3.9 ng/mL (ref 0.3–4.0)
Relative Index: INVALID (ref 0.0–2.5)
Relative Index: INVALID (ref 0.0–2.5)
Relative Index: INVALID (ref 0.0–2.5)
Total CK: 55 U/L (ref 7–177)
Total CK: 62 U/L (ref 7–177)
Total CK: 64 U/L (ref 7–177)
Troponin I: 0.02 ng/mL (ref 0.00–0.06)
Troponin I: 0.02 ng/mL (ref 0.00–0.06)
Troponin I: 0.03 ng/mL (ref 0.00–0.06)

## 2010-04-08 LAB — HEMOGLOBIN AND HEMATOCRIT, BLOOD
HCT: 31.3 % — ABNORMAL LOW (ref 36.0–46.0)
HCT: 31.4 % — ABNORMAL LOW (ref 36.0–46.0)
HCT: 31.9 % — ABNORMAL LOW (ref 36.0–46.0)
HCT: 33.2 % — ABNORMAL LOW (ref 36.0–46.0)
Hemoglobin: 10.3 g/dL — ABNORMAL LOW (ref 12.0–15.0)
Hemoglobin: 10.4 g/dL — ABNORMAL LOW (ref 12.0–15.0)
Hemoglobin: 10.6 g/dL — ABNORMAL LOW (ref 12.0–15.0)
Hemoglobin: 10.9 g/dL — ABNORMAL LOW (ref 12.0–15.0)

## 2010-04-08 LAB — PROTIME-INR
INR: 1.3 (ref 0.00–1.49)
Prothrombin Time: 16.4 seconds — ABNORMAL HIGH (ref 11.6–15.2)

## 2010-04-08 LAB — HEMOGLOBIN A1C
Hgb A1c MFr Bld: 6.2 % — ABNORMAL HIGH (ref <5.7)
Mean Plasma Glucose: 131 mg/dL — ABNORMAL HIGH (ref <117)

## 2010-04-08 LAB — TYPE AND SCREEN
ABO/RH(D): O POS
Antibody Screen: NEGATIVE

## 2010-04-08 LAB — HEMOGLOBINOPATHY EVALUATION
Hemoglobin Other: 0 % (ref 0.0–0.0)
Hgb A2 Quant: 2.9 % (ref 2.2–3.2)
Hgb A: 57.4 % — ABNORMAL LOW (ref 96.8–97.8)
Hgb F Quant: 0 % (ref 0.0–2.0)
Hgb S Quant: 39.7 % — ABNORMAL HIGH (ref 0.0–0.0)

## 2010-04-08 LAB — TSH
TSH: 0.631 u[IU]/mL (ref 0.350–4.500)
TSH: 0.948 u[IU]/mL (ref 0.350–4.500)

## 2010-04-08 LAB — APTT: aPTT: 39 seconds — ABNORMAL HIGH (ref 24–37)

## 2010-04-08 LAB — ABO/RH: ABO/RH(D): O POS

## 2010-04-08 LAB — HEMOCCULT GUIAC POC 1CARD (OFFICE): Fecal Occult Bld: POSITIVE

## 2010-04-13 NOTE — Assessment & Plan Note (Signed)
Summary: rectal bleeding, hx of diverticular bleed in sept,      Allergies Added:   History of Present Illness Visit Type: Initial Visit Primary GI MD: Stan Head MD [facility] Primary Provider: Bayard Males Requesting Provider: n/a Chief Complaint: Rectal Bleeding History of Present Illness:   Stacey [name] 60 YO FEMALE KNOWN TO DR. Leone Payor. SHE HAS HX OF DIVERTICULAR DISEASE AND RECURRENT DIVERTICULAR BLEEDING. SHE WAS HOSPITALIZED IN 9/11 WITH A DIVERTICULAR BLEED.,SHE DID NOT REQUIRE TRANSFUSIONS. HER HGB WAS 10.7 ON DISCHARGE.  LAST COLONOSCOPY WAS DONE IN 2009 WITH PAN DIVERTICULOSIS, AND INT. HEMORRHOIDS. SHE COMES IN TODAY WITH ONSET OF RECTAL BLEEDING ABOUT 4 DAYS AGO. SHE RELATES SMALL VOLUME DARK MAROONISH STOOLS. SHE HAD 3-4 BM'S THE FIRST DAY AND SAYS SHE HAS HAS LESS FREQUENT STOOLS EACH DAY SINCE. TODAY SHE HAD ONE SMALL DARK BLOODY STOOL. SHE DENIES ANY ABDOMINAL PAIN OR CRAMPING, NO RECTAL SXS. +NAUSEA,NO VOMITING. SHE IS IN THE MIDST OF A WORKUP AT [facility] FOR HER  CARDIOMYOPATHY-HAD AN APPT THERE YESTERDAY, ANS IS WEARING A MONITOR TODAY. SHE PUT OFF COMING IN BECAUSE OF THESE APPTS. SHE FEELS TIRED, BUT DENIES DIZZINESS ,LIGHTHEADEDNESS ETC. NO ASA OR BLOOD THINNERS.   GI Review of Systems    Reports bloating, loss of appetite, and  nausea.      Denies abdominal pain, acid reflux, belching, chest pain, dysphagia with liquids, dysphagia with solids, heartburn, vomiting, vomiting blood, weight loss, and  weight gain.      Reports black tarry stools, diarrhea, diverticulosis, and  rectal bleeding.     Denies anal fissure, change in bowel habit, constipation, fecal incontinence, heme positive stool, hemorrhoids, irritable bowel syndrome, jaundice, light color stool, liver problems, and  rectal pain.    Current Medications (verified): 1)  Amlodipine Besylate 5 Mg Tabs (Amlodipine Besylate) .Marland Kitchen.. 1po Once Daily 2)  Metoprolol Succinate 200 Mg Xr24h-Tab (Metoprolol Succinate) .... One  Daily 3)  Hydrocodone-Acetaminophen 5-500 Mg Tabs (Hydrocodone-Acetaminophen) .... As Needed 4)  Topamax 200 Mg Tabs (Topiramate) .... 2 By Mouth Daily 5)  Gabapentin 300 Mg Caps (Gabapentin) .Marland Kitchen.. 1 By Mouth Three Times A Day 6)  Cetirizine Hcl 10 Mg Tabs (Cetirizine Hcl) .Marland Kitchen.. 1po Once Daily As Needed Allergies 7)  Oxygen .... 2l At Night 8)  Promethazine-Codeine 6.25-10 Mg/97ml Syrp (Promethazine-Codeine) .... 5 Cc By Mouth Four Times A Day As Needed For Cough Symptoms 9)  Simvastatin 20 Mg Tabs (Simvastatin) .Marland Kitchen.. 1 Tab Daily 10)  Amoxicillin-Pot Clavulanate 875-125 Mg Tabs (Amoxicillin-Pot Clavulanate) .Marland Kitchen.. 1po Two Times A Day 11)  Tessalon Perles 100 Mg Caps (Benzonatate) .Marland Kitchen.. 1-2 By Mouth Three Times A Day As Needed 12)  Prednisone 10 Mg Tabs (Prednisone) .... 3po Qd For 3days, Then 2po Qd For 3days, Then 1po Qd For 3days, Then Stop 13)  Tramadol Hcl 100 Mg Xr24h-Tab (Tramadol Hcl) .Marland Kitchen.. 1 By Mouth Once Daily As Needed Pain  Allergies (verified): 1)  ! Codeine 2)  ! Asa 3)  ! Ace Inhibitors 4)  ! * Soy  Past History:  Past Surgical History: Last updated: 10/20/2009 Hysterectomy - 1987 Ventral hernia repair - 6/08  Family History: Last updated: 12/07/2006 adopted - no hx available except: daughter with colon cancer daughter with bleeding disorder  Social History: Last updated: 08/31/2009 Former Smoker.  Quit smoking 2001.  Smoked on and off x 20 years.  Smoked 2-3 ciag daily.   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  The patient denies anorexia, fever, weight loss, weight gain, vision loss, decreased hearing, hoarseness,  chest pain, syncope, dyspnea on exertion, peripheral edema, prolonged cough, headaches, hemoptysis, melena, severe indigestion/heartburn, hematuria, incontinence, genital sores, muscle weakness, suspicious skin lesions, transient blindness, difficulty walking, depression, unusual weight change, abnormal bleeding, enlarged lymph nodes, angioedema, and breast masses.         SEE HPI  Vital Signs:  Patient profile:   60 year old female Height:      69 inches Weight:      165 pounds BMI:     24.45 BSA:     1.91 Pulse rate:   66 / minute Pulse rhythm:   regular BP sitting:   114 / 72  (left arm)  Vitals Entered By: Merri Ray CMA [facility]) (April 06, 2010 2:44 PM)  Physical Exam  General:  Well developed, well nourished, no acute distress. Head:  Normocephalic and atraumatic. Eyes:  PERRLA, no icterus. Neck:  Supple; no masses or thyromegaly. Lungs:  Clear throughout to auscultation. Heart:  Regular rate and rhythmPMI displaced:, heart murmur systolic:, and heart murmur diastolic:.   Abdomen:  SOFT, NONTENDER, NO MASS OR HSM,BS+ Rectal:  DARK MAROON STOOL IN VAULT Extremities:  No clubbing, cyanosis, edema or deformities noted. Neurologic:  Alert and  oriented x4;  grossly normal neurologically. Psych:  Alert and cooperative. Normal mood and affect.   Impression & Recommendations:  Problem # 1:  DIVERTICULOSIS, COLON, WITH HEMORRHAGE (ICD-562.12) Assessment New 60 YO FEMALE WITH HYPERTROPHIC CARDIOMYOPATHY, WITH RECURRENT LOW GRADE DIVERTICULAR BLEEDING. SHE IS STABLE AND BLEEDING IS DECREASING AT THIS POINT.  CBC NOW BMET DISCUSSED POSSIBLE NEED FOR TRANSFUSION, AND OR ADMISSION DEPENDING ON HGB. SHE DOES NOT WANT TO BE HOSPITALIZED , BUT UNDERSTANDS. FURTHER PLANS PENDING LABS HOME TO REST. Orders: TLB-CBC Platelet - w/Differential (85025-CBCD) TLB-BMP (Basic Metabolic Panel-BMET) (80048-METABOL)  Problem # 2:  CONGESTIVE HEART FAILURE (ICD-428.00) Assessment: Comment  Only  Problem # 3:  ADENOCARCINOMA, COLON, FAMILY HX (ICD-V16.0) Assessment: Comment Only DAUGHTER-PT WILL NEED FOLLOW UP COLON 2014  Problem # 4:  SICKLE CELL ANEMIA (ICD-282.60) Assessment: Comment Only  Patient Instructions: 1)  Please go to lab, basement level. 2)  We will call you after we get the lab results.   3)  Copy sent to : Dr. Oliver Barre 4)  The medication list was reviewed and reconciled.  All changed / newly prescribed medications were explained.  A complete medication list was provided to the patient / caregiver.

## 2010-04-13 NOTE — Progress Notes (Signed)
Summary: Needs antibiotics after ER visit   Phone Note Call from Patient Call back at Home Phone [phone]   Call For: Dr Leone Payor Reason for Call: Talk to Nurse Summary of Call: Was in the Emergency Room for a flare up. Was told to call office so we can order antibiotics for her. Initial call taken by: Leanor Kail [facility],  April 06, 2010 9:06 AM  Follow-up for Phone Call        Patient was seen in the hospital last Fall for a lower GI bleed by Dr Russella Dar and Dr Leone Payor.  No procedures were performed.  She was asked to follow up as needed.  Patient has been having dark bloody BM with stool.  She is tired, but otehr wise feels fine.  She will come in today and see Mike Gip PA at 2:30 Follow-up by: Darcey Nora RN, CGRN,  April 06, 2010 12:26 PM

## 2010-04-13 NOTE — Letter (Signed)
Summary: Out of Work  [facility]  [address] [place], [place] [address]   Phone: [phone]  Fax: [phone]    April 08, 2010   Employee:  NAKKIA MACKIEWICZ [employer]    To Whom It May Concern:   For Medical reasons, please excuse the above named employee from work for the following dates:  Start:   04-05-2010  End:   04-07-2010  If you need additional information, please feel free to contact our office.         Sincerely,      Amy Esterwood PA-C  Appended Document: Out of Work Pt came to our office to ask for work note.  Also she went back to work today. She has had no bleeding .  Her bowel movements are back to  normal.  She is starting to feel better.  Just a twinge of abd discomfort but felt good enough to return to work.    Appended Document: Out of Work good,glad she is better,

## 2010-04-16 ENCOUNTER — Encounter: Payer: Self-pay | Admitting: Internal Medicine

## 2010-04-16 ENCOUNTER — Ambulatory Visit (INDEPENDENT_AMBULATORY_CARE_PROVIDER_SITE_OTHER): Payer: BC Managed Care – PPO | Admitting: Internal Medicine

## 2010-04-16 VITALS — BP 110/80 | HR 57 | Temp 97.6°F | Ht 69.0 in | Wt 170.0 lb

## 2010-04-16 DIAGNOSIS — F411 Generalized anxiety disorder: Secondary | ICD-10-CM

## 2010-04-16 DIAGNOSIS — J019 Acute sinusitis, unspecified: Secondary | ICD-10-CM

## 2010-04-16 DIAGNOSIS — Z0000 Encounter for general adult medical examination without abnormal findings: Secondary | ICD-10-CM | POA: Insufficient documentation

## 2010-04-16 DIAGNOSIS — I10 Essential (primary) hypertension: Secondary | ICD-10-CM

## 2010-04-16 DIAGNOSIS — K625 Hemorrhage of anus and rectum: Secondary | ICD-10-CM

## 2010-04-16 DIAGNOSIS — Z86010 Personal history of colonic polyps: Secondary | ICD-10-CM

## 2010-04-16 MED ORDER — PROMETHAZINE-CODEINE 6.25-10 MG/5ML PO SYRP: 5.0000 mL | ORAL_SOLUTION | Freq: Four times a day (QID) | ORAL | Status: DC | PRN

## 2010-04-16 MED ORDER — LEVOFLOXACIN 500 MG PO TABS: 500.0000 mg | ORAL_TABLET | Freq: Every day | ORAL | Status: AC

## 2010-04-16 NOTE — Patient Instructions (Signed)
Take all new medications as prescribed Continue all other medications as before  Good luck with your upcoming ablation and heart surgury! Please return in August 2012 with labs done 3-5 days prior

## 2010-04-16 NOTE — Progress Notes (Signed)
Here to f/u - c/o 3 days onset moderate to severe pain, swelling to max sinus areas, with fever, adn greenish d/c (no blood);  Also some right ear discomfort, but no hearing loss;  Has some off balance in the last day or so, but not lightheaded or orthostatic;  Has some nausea but no vomiting, also with less appetite, night sweats, hard to sleep ; no ST but some slight nonprod cough (helped with the tess perles),  Pt denies chest pain, increased sob or doe, wheezing, orthopnea, PND, increased LE swelling, palpitations, dizziness or syncope.    .Pt denies new neurological symptoms such as new headache, or facial or extremity weakness or numbness.    Pt denies polydipsia, polyuria  Pt states overall good compliance with meds, trying to follow lower cholesterol  diet, wt overall stable but little exercise however.      No further rectal bleeding, other blood loss, dizziness.   Denies worsening depressive symptoms, suicidal ideation, or panic, though has ongoing anxiety, not increased recently.    Pt denies fever, wt loss, night sweats, loss of appetite, or other constitutional symptoms.  Overall good compliance with treatment, and good medicine tolerability.   Past Medical History  Diagnosis Date  . HYPERLIPIDEMIA 08/01/2006  . OBESITY 09/16/2006  . SICKLE CELL ANEMIA 08/01/2006  . ANXIETY 09/16/2006  . DEPRESSION 09/16/2006  . HYPERTENSION 08/01/2006  . 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 BRADYCARDIA 10/13/2009  . CONGESTIVE HEART FAILURE 09/16/2006  . ALLERGIC RHINITIS 12/07/2006  . ASTHMA 08/01/2006  . VENTRAL HERNIA 12/07/2006  . GASTROINTESTINAL HEMORRHAGE 10/13/2009  . SHOULDER PAIN, LEFT 12/10/2008  . Headache 12/10/2007  . Hypoxemia 03/11/2008  . ANGIOEDEMA 03/07/2008  . ABDOMINAL WALL CONTUSION 02/18/2010  . Wheezing 04/01/2010  . DIVERTICULOSIS-COLON 04/06/2010  . DIVERTICULOSIS, COLON, WITH HEMORRHAGE 04/06/2010  . RECTAL BLEEDING 04/06/2010   History reviewed. No pertinent past surgical  history.  does not have a smoking history on file. She does not have any smokeless tobacco history on file. Her alcohol and drug histories not on file. family history is not on file. Allergies  Allergen Reactions  . Ace Inhibitors     REACTION: angioedema right eye  . Aspirin   . Codeine     Review of Systems  Constitutional: Negative for diaphoresis and unexpected weight change.  HENT: Negative for drooling and tinnitus.   Eyes: Negative for photophobia and visual disturbance.  Respiratory: Negative for choking and stridor.   Gastrointestinal: Negative for vomiting and blood in stool.  Genitourinary: Negative for hematuria and decreased urine volume.  Musculoskeletal: Negative for gait problem.  Skin: Negative for color change and wound.  Neurological: Negative for tremors and numbness.  Psychiatric/Behavioral: Negative for decreased concentration. The patient is not hyperactive.    Physical Exam  Constitutional: Pt appears well-developed and well-nourished.  HENT: Head: Normocephalic.  Right Ear: External ear normal.  Left Ear: External ear normal.  Sinus tender bilat Eyes: Conjunctivae and EOM are normal. Pupils are equal, round, and reactive to light.  Neck: Normal range of motion. Neck supple.  Cardiovascular: Normal rate and regular rhythm.   Pulmonary/Chest: Effort normal and breath sounds normal.  Abd:  Soft, NT, non-distended, + BS Neurological: Pt is alert. No cranial nerve deficit.  Skin: Skin is warm. No erythema.  Psychiatric: Pt behavior is normal. Thought content normal.  1+ anxious

## 2010-04-20 ENCOUNTER — Encounter: Payer: Self-pay | Admitting: Internal Medicine

## 2010-04-20 ENCOUNTER — Ambulatory Visit (INDEPENDENT_AMBULATORY_CARE_PROVIDER_SITE_OTHER): Payer: BC Managed Care – PPO | Admitting: Internal Medicine

## 2010-04-20 DIAGNOSIS — F3289 Other specified depressive episodes: Secondary | ICD-10-CM

## 2010-04-20 DIAGNOSIS — H1033 Unspecified acute conjunctivitis, bilateral: Secondary | ICD-10-CM | POA: Insufficient documentation

## 2010-04-20 DIAGNOSIS — F329 Major depressive disorder, single episode, unspecified: Secondary | ICD-10-CM

## 2010-04-20 DIAGNOSIS — J309 Allergic rhinitis, unspecified: Secondary | ICD-10-CM

## 2010-04-20 DIAGNOSIS — H1030 Unspecified acute conjunctivitis, unspecified eye: Secondary | ICD-10-CM

## 2010-04-20 DIAGNOSIS — J019 Acute sinusitis, unspecified: Secondary | ICD-10-CM

## 2010-04-20 MED ORDER — TOBRAMYCIN 0.3 % OP SOLN: 1.0000 [drp] | Freq: Four times a day (QID) | OPHTHALMIC | Status: AC

## 2010-04-20 MED ORDER — METHYLPREDNISOLONE ACETATE 80 MG/ML IJ SUSP
120.0000 mg | Freq: Once | INTRAMUSCULAR | Status: AC
  Administered 2010-04-20: 120 mg via INTRAMUSCULAR

## 2010-04-20 NOTE — Assessment & Plan Note (Addendum)
Clinically much improved, to finish the levaquin as prescribed, f/u any worsening symptoms

## 2010-04-20 NOTE — Patient Instructions (Signed)
You had the steroid shot today Take all new medications as prescribed Continue all other medications as before, including finishing the sinus antibiotic Good Luck with your husband coming home, and I hope you get to [facility] soon.

## 2010-04-20 NOTE — Assessment & Plan Note (Signed)
Mild to mod, involves upper and lower lids both eyes with clearish/cloudy d/c;  Suspect infectious but also with allergic type symptoms as well;  For today - depomedrol IM x 1, OTC allegra prn after, as well a tobramycin opth asd

## 2010-04-20 NOTE — Assessment & Plan Note (Signed)
Mild at best, for tx as per above   - see today's acute conjunctivitis

## 2010-04-20 NOTE — Assessment & Plan Note (Signed)
stable overall by hx and exam, ok to continue meds/tx as is ,  Though marked increased stress recently related to husband current RLL pna, and he is to go home soon, she is primary caretaker, and has had to put off her f/u with [facility] cardiology due to this

## 2010-04-20 NOTE — Progress Notes (Signed)
Here to f/u;  Has been improving with lessening facial pain, pressure, fever and greenish sinus d/c since last seen mar 6 now finishing the levaquin;  But unfortunately in the lat 2 days with mild to mod worsening bilat eye swelling, itching, mild discomfort and d/c, without vision loss, HA, chills, ST, cough.  Symptoms have been somewhat worse to going outdoors, and has hx of allergies seasonal in the past.  Pt denies chest pain, increased sob or doe, wheezing, orthopnea, PND, increased LE swelling, palpitations, dizziness or syncope.    Pt denies new neurological symptoms such as new headache, or facial or extremity weakness or numbness.   Pt denies polydipsia, polyuria.  Husband has been ill in the hosp with RLL pna, to come home soon, and she thinks she has been around other ill persons in the hosp visiting him.  No hx of MRSA.  Overall good compliance with treatment, and good medicine tolerability.  Denies worsening depressive symptoms, suicidal ideation, or panic, though has ongoing anxiety, some increaed recently due to above.   Pt denies recent wt loss, night sweats, loss of appetite, or other constitutional symptoms   Past Medical History  Diagnosis Date  . HYPERLIPIDEMIA 08/01/2006  . OBESITY 09/16/2006  . SICKLE CELL ANEMIA 08/01/2006  . ANXIETY 09/16/2006  . DEPRESSION 09/16/2006  . HYPERTENSION 08/01/2006  . 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 BRADYCARDIA 10/13/2009  . CONGESTIVE HEART FAILURE 09/16/2006  . ALLERGIC RHINITIS 12/07/2006  . ASTHMA 08/01/2006  . VENTRAL HERNIA 12/07/2006  . GASTROINTESTINAL HEMORRHAGE 10/13/2009  . SHOULDER PAIN, LEFT 12/10/2008  . Headache 12/10/2007  . Hypoxemia 03/11/2008  . ANGIOEDEMA 03/07/2008  . ABDOMINAL WALL CONTUSION 02/18/2010  . Wheezing 04/01/2010  . DIVERTICULOSIS-COLON 04/06/2010  . DIVERTICULOSIS, COLON, WITH HEMORRHAGE 04/06/2010  . RECTAL BLEEDING 04/06/2010   Past Surgical History  Procedure Date  . Abdominal hysterectomy 1987  .  Ventral hernia repair 06/2006    reports that she has quit smoking. She does not have any smokeless tobacco history on file. She reports that she drinks alcohol. She reports that she does not use illicit drugs. family history includes Cancer in her daughter. Allergies  Allergen Reactions  . Ace Inhibitors     REACTION: angioedema right eye  . Aspirin   . Codeine     Review of Systems  Constitutional: Negative for diaphoresis and unexpected weight change.  HENT: Negative for drooling and tinnitus.   Eyes: Negative for photophobia and visual disturbance.  Respiratory: Negative for choking and stridor.   Gastrointestinal: Negative for vomiting and blood in stool.  Genitourinary: Negative for hematuria and decreased urine volume.  Musculoskeletal: Negative for gait problem.  Skin: Negative for color change and wound.  Neurological: Negative for tremors and numbness.  Psychiatric/Behavioral: Negative for decreased concentration. The patient is not hyperactive.    Physical Exam  VS noted Constitutional: Pt appears well-developed and well-nourished.  HENT: Head: Normocephalic. Bilat upper and lower eyelids with 1-2+ swelling, mild tender, eyes with mild d/c Right Ear: External ear normal.  Left Ear: External ear normal.  Sinus nontender.  No submandibular LA Eyes: Conjunctivae and EOM are normal. Pupils are equal, round, and reactive to light.  Neck: Normal range of motion. Neck supple.  Cardiovascular: Normal rate and regular rhythm.   Pulmonary/Chest: Effort normal and breath sounds normal.  Abd:  Soft, NT, non-distended, + BS Neurological: Pt is alert. No cranial nerve deficit.  Skin: Skin is warm. No erythema.  Psychiatric: Pt behavior is normal.  Thought content normal.

## 2010-05-28 ENCOUNTER — Encounter: Payer: Self-pay | Admitting: Cardiology

## 2010-06-08 NOTE — Assessment & Plan Note (Signed)
[facility]                            CARDIOLOGY OFFICE NOTE   Stacey [name], BEGIN                 MRN:          [id number]  DATE:10/08/2007                            DOB:          1950/06/17    PRIMARY CARE PHYSICIAN:  [name], MD   REASON FOR PRESENTATION:  Evaluate the patient with hypertensive  cardiomyopathy.   HISTORY OF PRESENT ILLNESS:  The patient is a pleasant 60 year old  African American female with a history of hypertension and left  ventricular hypertrophy.  She also has features consistent with some  septal hypertrophy.  This has been managed conservatively with blood  pressure control.  She is strict on salt and fluid restriction.  She has  had no significant shortness of breath, though she limits her activities  to avoid this.  She does some walking.  She is on her feet all day as a  Runner, broadcasting/film/video.  With this management, she does not get any acute shortness of  breath.  She has not had any PND or orthopnea.  She has had no  palpitations, presyncope, or syncope.  She takes blood pressure at home,  and she states it is typically in the 120s/70s, but it seems to be  elevated today.   PAST MEDICAL HISTORY:  1. Left ventricular hypertrophy.  2. Hypertension.  3. Hyperlipidemia.  4. Sickle cell anemia.  5. Asymmetric septal hypertrophy.  6. Hysterectomy.   ALLERGIES:  Intolerances:  1. CODEINE caused mental status changes.  2. AFRIN caused gastritis.   MEDICATIONS:  1. Benazepril 40 mg daily.  2. Zyrtec.  3. Singulair 10 mg at bed time.  4. Metoprolol 150 mg daily.   REVIEW OF SYSTEMS:  As stated in the HPI and otherwise negative for  other systems.   PHYSICAL EXAMINATION:  The patient is in no distress.  Blood pressure 154/90, heart rate 76 and regular, weight 167 pounds, and  body mass index 24.  HEENT:  Eyes are unremarkable, pupils are equal round and reactive to  light, fundi not visualized, oral mucosa  unremarkable.  NECK:  No jugular venous distention at 45 degrees, carotid upstroke  brisk and symmetrical, no bruits, no thyromegaly.  LYMPHATICS:  No cervical, axillary, or inguinal adenopathy.  LUNGS:  Clear to auscultation bilaterally.  BACK:  No costovertebral angle tenderness.  CHEST:  Unremarkable.  HEART:  PMI not displaced or sustained.  S1 and S2 are within normal  limits. No S3, no S4, 3/6 apical systolic murmur radiating at the aortic  outflow tract and increasing slightly with the strain phase of Valsalva.  ABDOMEN:  Flat, positive bowel sounds, normal in frequency and pitch, no  bruits, no rebound, no guarding, no midline pulsatile mass, no  hepatomegaly, no splenomegaly.  SKIN:  No rashes, no nodules.  EXTREMITIES: Pulses 2+ throughout, no edema, no cyanosis, no clubbing.  NEURO:  Oriented to place, person, and time, cranial nerves II through  XII grossly intact, motor grossly intact.   EKG, sinus rhythm, rate 76, left ventricular hypertrophy with  repolarization changes,  unchanged from previous EKGs.   ASSESSMENT AND PLAN:  1. Left ventricular hypertrophy/hypertrophic cardiomyopathy:  The      patient has both the concentric hypertrophy and some asymmetric      septal hypertrophy.  At this point, she understands salt and fluid      restriction and blood pressure control.  She is compliant with this      regimen.  At this point, she had an echocardiogram last year, so I      will not repeat one, but would probably do one again in a year when      she comes back to see me.  2. Hypertension:  Blood pressure is slightly elevated today.  However,      she assures me that it is well controlled at home.  I think she is      very worried about her health care and her medicines, and so I will      defer further change in her blood pressure medicines, but she will      let me know if she has increasing blood pressures going forward.  3. Sickle cell anemia.  She has had no  acute exacerbations of this.      She manages conservatively.  4. Followup:  I will see her back in 1 year or sooner if needed.     Rollene Rotunda, MD, [facility]  Electronically Signed    JH/MedQ  DD: 10/08/2007  DT: 10/09/2007  Job #: [id number]   cc:   [name], MD

## 2010-06-08 NOTE — Op Note (Signed)
[name], Stacey [name]          ACCOUNT NO.:  [id number]   MEDICAL RECORD NO.:  [id number]          PATIENT TYPE:  AMB   LOCATION:  DAY                          FACILITY:  [facility]   PHYSICIAN:  Ollen Gross. Vernell Morgans, M.D. DATE OF BIRTH:  1950-07-23   DATE OF PROCEDURE:  07/12/2006  DATE OF DISCHARGE:                               OPERATIVE REPORT   PREOPERATIVE DIAGNOSIS:  Umbilical and supraumbilical hernias x3.   POSTOPERATIVE DIAGNOSIS:  Umbilical and supraumbilical hernias x3.   PROCEDURE:  Umbilical and supraumbilical hernia repairs.   SURGEON:  Dr. Carolynne Edouard.   ANESTHESIA:  General endotracheal.   PROCEDURE:  After informed consent was obtained, the patient was brought  to the operating room and placed in supine position on the operating  table.  After adequate induction of general anesthesia, the patient's  abdomen was prepped with Betadine and draped in usual sterile manner.  The area above and around the umbilicus was infiltrated 0.25% Marcaine.  An incision was made vertically just above the umbilicus with a 15 blade  knife.  This incision was carried down through the skin and subcutaneous  tissue sharply with the electrocautery until the linea alba was  identified.  The patient appeared to have small fatty bulges at the  umbilicus and above the umbilicus.  These were all dissected out sharply  with electrocautery.  The patient had a very small 2-3 mm umbilical  hernia as well as 3 smaller 1- to 2-mm supraumbilical hernias in the  fascia.  The fascia appeared to be healthy with good edges.  Because  these defects were very small, each of them was repaired with one or two  #1 Novofil interrupted stitches.  Once this was accomplished, the  hernias were appeared to be repaired without any tension.  No other  hernia defects were identified.  The wound was then irrigated with  copious amounts of saline.  The subcutaneous tissue was reapproximated  with interrupted 3-0 Vicryl  stitches, and the skin was closed with a  running 4-0 Monocryl subcuticular stitch.  Steri-Strips were applied.  The patient tolerated the procedure well.  At the end of the case, all  needle, sponge and instrument counts were correct.  The patient was then  awakened and taken to recovery in stable condition.      Ollen Gross. Vernell Morgans, M.D.  Electronically Signed     PST/MEDQ  D:  07/12/2006  T:  07/12/2006  Job:  [id number]

## 2010-06-11 NOTE — Discharge Summary (Signed)
[facility]  Patient:    Stacey [name], Stacey [name] Visit Number: [id number] MRN: [id number]          Service Type: MED Location: 3W 0372 01 Attending Physician:  Nyoka Cowden Dictated by:   Ricard Dillon, M.D. [facility] Admit Date:  01/13/2001 Disc. Date: 01/15/01   CC:         Biagio Borg, M.D. [facility]   Discharge Summary  ADMITTING DIAGNOSES:  Congestive heart failure, rule out myocardial infarction.  DISCHARGE DIAGNOSES:  New onset congestive heart failure, myocardial infarction ruled out by enzyme, hypertension, bronchitis.  HOSPITAL COURSE:  Patient is a 60 year old black female with sickle cell disease, history of arrhythmia, questionable PAF, hypertension, and cardiomegaly who presents with acute onset of mild CHF with shortness of breath and was admitted initially to the step-down unit by Dr. Bluford Kaufmann.  She was diuresed with a single dose of Lasix with improvement both clinically and radiographically of her congestive heart failure.  Her troponins were negative.  A BNP was elevated indicating that this was indeed congestive heart failure of a mild nature.  An EKG showed no evolution and sinus bradycardia throughout her admission.  Appropriate laboratory values were obtained including chemistries which showed a normal potassium, a thyroid panel which was normal, a hemoglobin which was 35.6, and a hemoglobin of 12.2 which apparently was at her baseline, blood gases which were initially hypoxemic, but which resolved with oxygen and diuresis.  Patient was stable throughout admission.  Shortness of breath and blood pressure resolved.  Prior to admission she was on Tarca, hydrochlorothiazide, potassium, and Celexa.  Her blood pressure medicines were changed to Hyzaar 50/12.5 and Toprol XL 50 one p.o. q.d. with good stability of blood pressure and pulse.  She is discharged in improved condition.  She is instructed to follow up with Dr.  Willaim Bane in two weeks.  Dr. Judi Cong nurse will schedule an outpatient echocardiogram and a Cardiolite test to complete work-up for new onset congestive heart failure.  I suspect that this was mild volume overload secondary to infection and blood pressure that has responded with control of her blood pressure and treatment of her infection.  Patient is discharged with these instructions.  She is discharged home in stable condition.  Prescription for Toprol XL 25 and Hyzaar 50/12.5 is given to the patient as well as a prescription for Tequin for five additional days. Dictated by:   Ricard Dillon, M.D. [place] Attending Physician:  Nyoka Cowden DD:  01/15/01 TD:  01/15/01 Job: [id number]

## 2010-06-11 NOTE — H&P (Signed)
[facility]  Patient:    Stacey [name], Stacey [name] Visit Number: [id number] MRN: [id number]          Service Type: MED Location: 2S 0255 01 Attending Physician:  Rogelia Boga Dictated by:   Gordy Savers, M.D. [facility] Admit Date:  01/13/2001                           History and Physical  CHIEF COMPLAINT:  Chest pain and shortness of breath.  HISTORY OF PRESENT ILLNESS:  The patient is a 60 year old black female who is admitted for evaluation and treatment of new-onset congestive heart failure.  This 60 year old female has a history of sickle cell disease and also prior history of tachyarrhythmias (probable paroxysmal atrial fibrillation).  She has a history of hypertension, cardiomegaly, and also a history of a heart murmur which was evaluated by a 2-D echocardiogram approximately five years ago.  The patient has no prior history of congestive heart failure, however. The patient has been on diuretic therapy intermittently over the years, and this was resumed three months ago due to worsening pedal edema.  The patient was clinically stable until approximately four days ago when she developed fairly generalized arthralgia related to her sickle cell disease.  Two days ago at work she became quite weak and nauseated and had to leave work earlier. At approximately midnight on the day of admission the patient developed acute chest pain described as tightness, became quite weak, lethargic, and developed a severe headache.  Because of her worsening clinical status, presented to the emergency room, where she was felt to have clinical congestive heart failure. Chest x-ray confirmed cardiomegaly and congestive heart failure, and the patient improved with treatment.  On arrival to the emergency department the patient was markedly hypoxic and required noninvasive ventilatory support. The patient is now admitted for further management of her congestive  heart failure.  PAST MEDICAL HISTORY:  The patient has a long history of hypertension as well as sickle cell disease.  She has required no hospital admissions for sickle cell crises but, apparently, has frequent mild pain crises.  She was hospitalized in 1987 for a hysterectomy and has had three pregnancies.  MEDICATIONS: 1. Tarka 4/240 1 daily. 2. Hydrochlorothiazide. 3. Potassium supplementation. 4. Celexa 20 mg daily.  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  Denies any ethanol use.  Has cut down her tobacco consumption to four cigarettes daily.  She was accompanied to the emergency department by her boyfriend.  FAMILY HISTORY:  Unknown.  The patient is adopted.  PHYSICAL EXAMINATION:  VITAL SIGNS:  Blood pressure 120/76, pulse rate 80-84, respiratory rate 30-32. The patient was on BiPAP ventilation with an FIO2 of 30% and O2 saturation 94%.  GENERAL:  Well-developed, weak, but alert black female with a face mask in place.  She was in no acute distress but did have resting tachypnea.  HEENT:  Head and neck exam was limited due to the face mask.  NECK:  No bruits or neck vein distention.  CHEST:  Essentially clear at the time of my examination.  On arrival to the emergency room, rales were described involving the lower one-half lung fields.  CARDIOVASCULAR:  S1, S2 were normal.  Rhythm was regular.  There was a grade 2-3/6 systolic murmur heard best about the left sternal border and base.  ABDOMEN:  Mildly and diffusely tender.  No guarding.  Bowel sounds were normal.  EXTREMITIES:  No edema.  Peripheral pulses were full.  LABORATORY DATA:  Electrocardiogram revealed LVH, normal sinus rhythm, and nonspecific ST and T-wave abnormalities.  Chest x-ray revealed cardiomegaly and congestive heart failure.  Initial CK was normal at 82.  Potassium was slightly decreased at 3.3.  WBC: Hemoglobin and hematocrit were normal.  IMPRESSION: 1.  New-onset congestive heart failure with acute respiratory failure. 2. Chest pain:  Rule out acute myocardial infarction.  ADDITIONAL DIAGNOSES: 1. 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 2. Paroxysmal atrial fibrillation. 3. Systolic murmur. 4. Sickle cell disease.  DISPOSITION:  The patient will be admitted to the intensive care unit.  She will be carefully diuresed.  A 2-D echocardiogram will be obtained.  Serial cardiac enzymes and EKGs will be obtained to exclude an acute MI.  The patient will be treated aggressively medically with IV heparin, nitrates, aspirin, and will be seen in consultation by cardiology.  Dictated by:   Gordy Savers, M.D. [facility] Attending Physician:  Rogelia Boga DD:  01/13/01 TD:  01/13/01 Job: [id number]

## 2010-06-11 NOTE — Assessment & Plan Note (Signed)
[facility]                            CARDIOLOGY OFFICE NOTE   Stacey [name]                 MRN:          [id number]  DATE:03/30/2006                            DOB:          17-Oct-1950    REASON FOR CONSULTATION:  The patient has hypertrophic cardiomyopathy  and is contemplating repair of an abdominal hernia by Dr. Carolynne Edouard.   HISTORY OF PRESENT ILLNESS:  [name] is a 60 year old woman with  sickle cell anemia and hypertrophic cardiomyopathy.  I last saw her in  April 2004.  Most recent echo was in July 2006.  At that time Dr.  Dietrich Pates was unable to quantify her left ventricular outflow gradient.  She has had longstanding mild to moderate exertional dyspnea.  She says  she can go up a single flight of stairs but is sometimes winded at the  top.  She cannot do all of her groceries without stopping.  She has rare  chest discomfort with extreme exertion.  She has not had any syncope or  presyncope.   PAST MEDICAL HISTORY:  1. Hypertension.  2. Hypercholesterolemia.  3. Borderline diabetes.  4. Sickle cell anemia.  5. Hypertrophic cardiomyopathy.  6. Hysterectomy in 1987.   ALLERGIES:  NKDA.  Drug intolerances:  Codeine causes mental status  changes and aspirin causes gastritis.   CURRENT MEDICATIONS:  1. Benazepril 40 mg daily.  2. Elidel cream twice per day.  3. Zyrtec p.r.n.  4. Singulair 10 mg q.h.s.  5. Metoprolol 50 mg twice daily.   SOCIAL HISTORY:  The patient is a Runner, broadcasting/film/video.  She is divorced with 3 grown  children.  She enjoys cooking and walking.  She quit smoking 5 years  ago.  Denies alcohol and illicit drug use.   FAMILY HISTORY:  The patient is adopted.  Three children are healthy.  They have not been screened for hypertrophic cardiomyopathy to her  knowledge.   REVIEW OF SYSTEMS:  Occasional headache and dizziness.  Exertional  dyspnea and chest discomfort as above.  Occasional nocturnal leg cramps.  Occasional leg swelling.  Review of systems is otherwise negative in  detail except as above.   PHYSICAL EXAMINATION:  GENERAL:  She is generally well-appearing in no  distress, with heart rate 62, blood pressure 110/70.  She weighs 169  pounds and is 5 feet 9 inches tall.  SKIN:  Normal.  HEENT:  Normal.  MUSCULOSKELETAL:  Normal.  NECK:  She has no jugular venous distention, thyromegaly or  lymphadenopathy.  CHEST:  Respiratory effort is normal.  Lungs are clear to auscultation.  CARDIAC:  She has a nonpalpable point of maximum cardiac impulse.  There  is a regular rate and rhythm with a 3/6 systolic ejection murmur along  the left sternal border.  ABDOMEN:  Bowel sounds are normal.  Soft, nondistended, nontender.  No  hepatosplenomegaly and no pulsatile midline mass.  EXTREMITIES:  Warm without clubbing, cyanosis, edema, or ulceration.  VASCULAR:  Carotid pulses 2+ bilaterally without bruit.  DP pulses 2+  bilaterally.   EKG demonstrates normal sinus rhythm with two blocked PACs.  There is  left  atrial enlargement and LVH in a pattern consistent with  hypertrophic cardiomyopathy.   IMPRESSION/RECOMMENDATION:  The patient is at somewhat elevated cardiac  risk from her upcoming surgery due to her hypertrophic cardiomyopathy.  To better assess this risk, we will schedule echocardiogram to reassess  her gradient.  We will increase her beta blocker from metoprolol  tartrate 50 mg twice per day to metoprolol succinate 150 mg once daily.  Will continue the benazepril.   It is critical that she remain well-hydrated throughout the  perioperative period.  In addition, she should maintain beta blockade.  She should take her beta blocker on the morning of the procedure.  If  she must remain n.p.o. more than 24 hours after the procedure, will need  to administer IV beta blocker.  I will ask Dr. Carolynne Edouard to notify us of her  hospitalization such that we can participate.  In addition, I would   recommend strongly that she spend the night in the hospital the night  after the procedure.     Salvadore Farber, MD  Electronically Signed    WED/MedQ  DD: 03/30/2006  DT: 03/30/2006  Job #: [id number]   cc:   Ollen Gross. Vernell Morgans, M.D.  [name], MD

## 2010-06-15 ENCOUNTER — Encounter: Payer: Self-pay | Admitting: Physician Assistant

## 2010-06-17 ENCOUNTER — Encounter: Payer: Self-pay | Admitting: Physician Assistant

## 2010-06-17 ENCOUNTER — Ambulatory Visit (INDEPENDENT_AMBULATORY_CARE_PROVIDER_SITE_OTHER): Payer: BC Managed Care – PPO | Admitting: Physician Assistant

## 2010-06-17 DIAGNOSIS — I421 Obstructive hypertrophic cardiomyopathy: Secondary | ICD-10-CM

## 2010-06-17 DIAGNOSIS — Z9889 Other specified postprocedural states: Secondary | ICD-10-CM | POA: Insufficient documentation

## 2010-06-17 DIAGNOSIS — I447 Left bundle-branch block, unspecified: Secondary | ICD-10-CM

## 2010-06-17 HISTORY — DX: Left bundle-branch block, unspecified: I44.7

## 2010-06-17 NOTE — Patient Instructions (Addendum)
Your physician recommends that you schedule a follow-up appointment in: 3 months with Dr. Antoine Poche 09/14/10 @ 10am   Your physician has requested that you have an echocardiogram 06/22/10 @ 7:30 for HOCM. Echocardiography is a painless test that uses sound waves to create images of your heart. It provides your doctor with information about the size and shape of your heart and how well your heart's chambers and valves are working. This procedure takes approximately one hour. There are no restrictions for this procedure.

## 2010-06-17 NOTE — Assessment & Plan Note (Signed)
She now has a left bundle branch block.  She denies any near syncope or lightheadedness.  Her heart rate is well controlled.

## 2010-06-17 NOTE — Progress Notes (Signed)
History of Present Illness: Primary Cardiologist:  Dr. Martie Lee Crawford-Fewell is a 60 y.o. female with a history of hypertrophic obstructive cardiomyopathy, hypertension, diastolic heart failure, sickle cell disease, HLP and borderline DM2 who was referred to Dr. Silvestre Mesi at [facility].  She has a history of syncope.  She underwent successful septal myomectomy on 4/26.  She returns today for follow up.  Her postoperative course was uneventful.  She saw Dr. Silvestre Mesi yesterday in follow up to remove her staples.  She feels well.  She denies significant chest discomfort.  She has been walking without significant shortness of breath.  She denies syncope, near syncope or lightheadedness.  She denies swelling or weight gain.  She denies orthopnea or PND.  Her weights have been stable at home.  Past Medical History  Diagnosis Date  . HYPERLIPIDEMIA 08/01/2006  . OBESITY 09/16/2006  . SICKLE CELL ANEMIA 08/01/2006  . ANXIETY 09/16/2006  . DEPRESSION 09/16/2006  . HYPERTENSION 08/01/2006  . Hypertrophic obstructive cardiomyopathy 08/01/2006    s/p septal myomectomy 4/12 at [facility] with Dr. Silvestre Mesi  . BRADYCARDIA 10/13/2009  . Diastolic heart failure 09/16/2006  . ALLERGIC RHINITIS 12/07/2006  . ASTHMA 08/01/2006  . VENTRAL HERNIA 12/07/2006  . GASTROINTESTINAL HEMORRHAGE 10/13/2009  . ANGIOEDEMA 03/07/2008  . ABDOMINAL WALL CONTUSION 02/18/2010  . Wheezing 04/01/2010  . DIVERTICULOSIS-COLON 04/06/2010  . DIVERTICULOSIS, COLON, WITH HEMORRHAGE 04/06/2010  . RECTAL BLEEDING 04/06/2010  . CAD (coronary artery disease)     Minimal plaque at cardiac catheterization 1/12: CFX 20%, EF 70%    Current Outpatient Prescriptions  Medication Sig Dispense Refill  . acetaminophen (TYLENOL) 650 MG suppository Place 650 mg rectally every 4 (four) hours as needed.        . benzonatate (TESSALON) 100 MG capsule Take 100 mg by mouth 3 (three) times daily as needed.        . cetirizine (ZYRTEC) 10 MG tablet Take 10 mg by mouth  daily.        . furosemide (LASIX) 20 MG tablet Take 20 mg by mouth daily.        Marland Kitchen gabapentin (NEURONTIN) 300 MG capsule Take 300 mg by mouth 3 (three) times daily.        . metoprolol (TOPROL-XL) 100 MG 24 hr tablet Take 100 mg by mouth daily.        Marland Kitchen oxycodone (OXY-IR) 5 MG capsule Take 5 mg by mouth every 4 (four) hours as needed.        . potassium chloride SA (K-DUR,KLOR-CON) 20 MEQ tablet Take 20 mEq by mouth daily.        . predniSONE (DELTASONE) 10 MG tablet Take 10 mg by mouth daily.        . sennosides-docusate sodium (SENOKOT-S) 8.6-50 MG tablet Take 2 tablets by mouth as needed.        . topiramate (TOPAMAX) 200 MG tablet Take 200 mg by mouth 2 (two) times daily.        Marland Kitchen DISCONTD: amLODipine (NORVASC) 5 MG tablet Take 5 mg by mouth daily.        Marland Kitchen DISCONTD: hydrocodone-acetaminophen (LORCET-HD) 5-500 MG per capsule Take 1 capsule by mouth every 6 (six) hours as needed.        Marland Kitchen DISCONTD: metoprolol (TOPROL-XL) 200 MG 24 hr tablet Take 200 mg by mouth daily.        Marland Kitchen DISCONTD: promethazine-codeine (PHENERGAN WITH CODEINE) 6.25-10 MG/5ML syrup Take 5 mLs by mouth every 6 (six) hours as  needed for cough. 5 cc by mouth four times a day as needed for cough  120 mL  1  . DISCONTD: simvastatin (ZOCOR) 20 MG tablet Take 20 mg by mouth daily.        Marland Kitchen DISCONTD: traMADol (ULTRAM-ER) 100 MG 24 hr tablet Take 100 mg by mouth daily. As needed for pain         Allergies: Allergies  Allergen Reactions  . Ace Inhibitors     REACTION: angioedema right eye  . Aspirin   . Codeine   . Soy Allergy     Vital Signs: BP 124/84  Pulse 84  Resp 18  Ht 5\' 9"  (1.753 m)  Wt 167 lb 12.8 oz (76.114 kg)  BMI 24.78 kg/m2  PHYSICAL EXAM: Well nourished, well developed, in no acute distress HEENT: normal Neck: no JVD Cardiac:  normal S1, S2; RRR; 2/6 systolic murmur heard best along the left lower sternal border Chest: Median sternotomy scar without erythema or discharge Lungs:  clear to  auscultation bilaterally, no wheezing, rhonchi or rales Abd: soft, nontender, no hepatomegaly Ext: no edema Skin: warm and dry Neuro:  CNs 2-12 intact, no focal abnormalities noted Psych: Normal affect  EKG:  Sinus rhythm, heart rate 84, left bundle branch block  ASSESSMENT AND PLAN:

## 2010-06-17 NOTE — Assessment & Plan Note (Signed)
She is doing very well post myomectomy.  Her blood pressure is well controlled.  Her volume status is stable.  She still has a systolic murmur on exam.  I will arrange a follow up echocardiogram for baseline.  She will follow up with Dr. Antoine Poche in 3 months, or sooner as needed.

## 2010-06-22 ENCOUNTER — Ambulatory Visit (HOSPITAL_COMMUNITY): Payer: BC Managed Care – PPO | Attending: Internal Medicine | Admitting: Radiology

## 2010-06-22 DIAGNOSIS — I421 Obstructive hypertrophic cardiomyopathy: Secondary | ICD-10-CM | POA: Insufficient documentation

## 2010-06-22 DIAGNOSIS — I059 Rheumatic mitral valve disease, unspecified: Secondary | ICD-10-CM | POA: Insufficient documentation

## 2010-06-22 DIAGNOSIS — I079 Rheumatic tricuspid valve disease, unspecified: Secondary | ICD-10-CM | POA: Insufficient documentation

## 2010-06-22 DIAGNOSIS — E119 Type 2 diabetes mellitus without complications: Secondary | ICD-10-CM | POA: Insufficient documentation

## 2010-06-22 DIAGNOSIS — I10 Essential (primary) hypertension: Secondary | ICD-10-CM | POA: Insufficient documentation

## 2010-06-23 ENCOUNTER — Encounter: Payer: Self-pay | Admitting: Physician Assistant

## 2010-06-25 ENCOUNTER — Ambulatory Visit (INDEPENDENT_AMBULATORY_CARE_PROVIDER_SITE_OTHER): Payer: BC Managed Care – PPO | Admitting: Internal Medicine

## 2010-06-25 ENCOUNTER — Encounter: Payer: Self-pay | Admitting: Internal Medicine

## 2010-06-25 DIAGNOSIS — Z0000 Encounter for general adult medical examination without abnormal findings: Secondary | ICD-10-CM

## 2010-06-25 DIAGNOSIS — R7302 Impaired glucose tolerance (oral): Secondary | ICD-10-CM

## 2010-06-25 DIAGNOSIS — R7309 Other abnormal glucose: Secondary | ICD-10-CM

## 2010-06-25 DIAGNOSIS — F411 Generalized anxiety disorder: Secondary | ICD-10-CM

## 2010-06-25 DIAGNOSIS — I10 Essential (primary) hypertension: Secondary | ICD-10-CM

## 2010-06-25 DIAGNOSIS — I509 Heart failure, unspecified: Secondary | ICD-10-CM

## 2010-06-25 HISTORY — DX: Impaired glucose tolerance (oral): R73.02

## 2010-06-25 MED ORDER — FUROSEMIDE 20 MG PO TABS: 20.0000 mg | ORAL_TABLET | Freq: Every day | ORAL | Status: DC

## 2010-06-25 NOTE — Assessment & Plan Note (Signed)
stable overall by hx and exam, most recent data reviewed with pt, and pt to continue medical treatment as before  BP Readings from Last 3 Encounters:  06/25/10 120/80  06/17/10 124/84  04/20/10 110/72

## 2010-06-25 NOTE — Assessment & Plan Note (Signed)
stable overall by hx and exam, most recent data reviewed with pt, and pt to continue medical treatment as before  Lab Results  Component Value Date   HGBA1C  Value: 6.2 (NOTE)                                                                       According to the ADA Clinical Practice Recommendations for 2011, when HbA1c is used as a screening test:   >=6.5%   Diagnostic of Diabetes Mellitus           (if abnormal result  is confirmed)  5.7-6.4%   Increased risk of developing Diabetes Mellitus  References:Diagnosis and Classification of Diabetes Mellitus,Diabetes Care,2011,34(Suppl 1):S62-S69 and Standards of Medical Care in         Diabetes - 2011,Diabetes Care,2011,34  (Suppl 1):S11-S61.* 01/25/2010

## 2010-06-25 NOTE — Progress Notes (Signed)
Subjective:    Patient ID: Stacey [name], female    DOB: 22-Dec-1950, 60 y.o.   MRN: [id number]  HPI  Here after cardiac surgury aprox 24;  Uncomplicated per pt;  Needs refill lasix, and wondering if she can stop the prednisone 5 mg she has taken fo 4 wks (? Asthma);  Pt denies chest pain, increased sob or doe, wheezing, orthopnea, PND, increased LE swelling, palpitations, dizziness or syncope. Pt denies new neurological symptoms such as new headache, or facial or extremity weakness or numbness  Pt denies polydipsia, polyuria, though sugar was mild elevated postop  Pt states overall good compliance with meds, trying to follow lower cholesterol, diabetic diet, wt overall stable but little exercise however.    Pt denies fever, wt loss, night sweats, loss of appetite, or other constitutional symptoms  Currently off all BP meds.  Denies worsening depressive symptoms, suicidal ideation, or panic, though has ongoing anxiety, not increased recently.  Past Medical History  Diagnosis Date  . HYPERLIPIDEMIA 08/01/2006  . OBESITY 09/16/2006  . SICKLE CELL ANEMIA 08/01/2006  . ANXIETY 09/16/2006  . DEPRESSION 09/16/2006  . HYPERTENSION 08/01/2006  . 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 s/p septal myomectomy 4/12 at [facility] with Dr. Silvestre Mesi;  b. echo 5/12: EF 60-65%, LVOT peak 18 mmHg; grade 1 diast dysfxn, mild SAM (improved since myomectomy)  . BRADYCARDIA 10/13/2009  . Diastolic heart failure 09/16/2006  . ALLERGIC RHINITIS 12/07/2006  . ASTHMA 08/01/2006  . VENTRAL HERNIA 12/07/2006  . GASTROINTESTINAL HEMORRHAGE 10/13/2009  . ANGIOEDEMA 03/07/2008  . ABDOMINAL WALL CONTUSION 02/18/2010  . Wheezing 04/01/2010  . DIVERTICULOSIS-COLON 04/06/2010  . DIVERTICULOSIS, COLON, WITH HEMORRHAGE 04/06/2010  . RECTAL BLEEDING 04/06/2010  . CAD (coronary artery disease)     Minimal plaque at cardiac catheterization 1/12: CFX 20%, EF 70%  . LBBB (left bundle branch block) 06/17/2010  . CONGESTIVE HEART  FAILURE 04/06/2010  . Impaired glucose tolerance 06/25/2010   Past Surgical History  Procedure Date  . Abdominal hysterectomy 1987  . Ventral hernia repair 06/2006    reports that she has quit smoking. She does not have any smokeless tobacco history on file. She reports that she drinks alcohol. She reports that she does not use illicit drugs. family history includes Cancer in her daughter. Allergies  Allergen Reactions  . Ace Inhibitors     REACTION: angioedema right eye  . Aspirin   . Codeine   . Soy Allergy    Current Outpatient Prescriptions on File Prior to Visit  Medication Sig Dispense Refill  . cetirizine (ZYRTEC) 10 MG tablet Take 10 mg by mouth daily.        Marland Kitchen gabapentin (NEURONTIN) 300 MG capsule Take 300 mg by mouth 3 (three) times daily.        . metoprolol (TOPROL-XL) 100 MG 24 hr tablet Take 100 mg by mouth daily.        Marland Kitchen oxycodone (OXY-IR) 5 MG capsule Take 5 mg by mouth every 4 (four) hours as needed.        . potassium chloride SA (K-DUR,KLOR-CON) 20 MEQ tablet Take 20 mEq by mouth daily.        . sennosides-docusate sodium (SENOKOT-S) 8.6-50 MG tablet Take 2 tablets by mouth as needed.        . topiramate (TOPAMAX) 200 MG tablet Take 200 mg by mouth 2 (two) times daily.        Marland Kitchen DISCONTD: furosemide (LASIX) 20 MG tablet Take 20 mg by  mouth daily.        Marland Kitchen DISCONTD: predniSONE (DELTASONE) 10 MG tablet Take 10 mg by mouth daily.        Marland Kitchen acetaminophen (TYLENOL) 650 MG suppository Place 650 mg rectally every 4 (four) hours as needed.        . benzonatate (TESSALON) 100 MG capsule Take 100 mg by mouth 3 (three) times daily as needed.         Review of Systems All otherwise neg per pt     Objective:   Physical Exam BP 120/80  Pulse 94  Temp(Src) 98.9 F (37.2 C) (Oral)  Ht 5\' 9"  (1.753 m)  Wt 168 lb (76.204 kg)  BMI 24.81 kg/m2  SpO2 96% Physical Exam  VS noted Constitutional: Pt appears well-developed and well-nourished.  HENT: Head: Normocephalic.  Right  Ear: External ear normal.  Left Ear: External ear normal.  Eyes: Conjunctivae and EOM are normal. Pupils are equal, round, and reactive to light.  Neck: Normal range of motion. Neck supple.  Cardiovascular: Normal rate and regular rhythm.   Pulmonary/Chest: Effort normal and breath sounds normal.  Abd:  Soft, NT, non-distended, + BS Neurological: Pt is alert. No cranial nerve deficit.  Skin: Skin is warm. No erythema.  Psychiatric: Pt behavior is normal. Thought content normal. 1+ nervous, not depressed appearing       Assessment & Plan:

## 2010-06-25 NOTE — Assessment & Plan Note (Signed)
stable overall by hx and exam, and pt to continue medical treatment as before 

## 2010-06-25 NOTE — Assessment & Plan Note (Signed)
stable overall by hx and exam, most recent data reviewed with pt, and pt to continue medical treatment as before  Lab Results  Component Value Date   WBC 10.4 04/08/2010   HGB 11.9* 04/08/2010   HCT 36.2 04/08/2010   PLT 207.0 04/08/2010   CHOL  Value: 252        ATP III CLASSIFICATION:  <200     mg/dL   Desirable  259-563  mg/dL   Borderline High  >=875    mg/dL   High       * 06/28/3327   TRIG 295* 01/25/2010   HDL 52 01/25/2010   LDLDIRECT 118.5 08/31/2009   ALT 13 01/25/2010   AST 15 01/25/2010   NA 141 04/06/2010   K 4.0 04/06/2010   CL 108 04/06/2010   CREATININE 0.7 04/06/2010   BUN 16 04/06/2010   CO2 24 04/06/2010   TSH 0.631 10/06/2009   INR 1.21 01/25/2010   HGBA1C  Value: 6.2 (NOTE)                                                                       According to the ADA Clinical Practice Recommendations for 2011, when HbA1c is used as a screening test:   >=6.5%   Diagnostic of Diabetes Mellitus           (if abnormal result  is confirmed)  5.7-6.4%   Increased risk of developing Diabetes Mellitus  References:Diagnosis and Classification of Diabetes Mellitus,Diabetes Care,2011,34(Suppl 1):S62-S69 and Standards of Medical Care in         Diabetes - 2011,Diabetes Care,2011,34  (Suppl 1):S11-S61.* 01/25/2010   Lasix refilled

## 2010-06-25 NOTE — Patient Instructions (Addendum)
Ok to stop the prednisone Continue all other medications as before Please return Aug 2012 with Lab testing done 3-5 days before

## 2010-06-28 ENCOUNTER — Other Ambulatory Visit: Payer: Self-pay

## 2010-06-28 MED ORDER — POTASSIUM CHLORIDE CRYS ER 20 MEQ PO TBCR: 20.0000 meq | EXTENDED_RELEASE_TABLET | Freq: Every day | ORAL | Status: DC

## 2010-08-12 ENCOUNTER — Other Ambulatory Visit: Payer: Self-pay

## 2010-08-23 ENCOUNTER — Encounter: Payer: Self-pay | Admitting: Internal Medicine

## 2010-08-26 ENCOUNTER — Other Ambulatory Visit (INDEPENDENT_AMBULATORY_CARE_PROVIDER_SITE_OTHER): Payer: BC Managed Care – PPO

## 2010-08-26 ENCOUNTER — Other Ambulatory Visit: Payer: Self-pay | Admitting: Internal Medicine

## 2010-08-26 DIAGNOSIS — Z0000 Encounter for general adult medical examination without abnormal findings: Secondary | ICD-10-CM

## 2010-08-26 DIAGNOSIS — R7309 Other abnormal glucose: Secondary | ICD-10-CM

## 2010-08-26 DIAGNOSIS — R7302 Impaired glucose tolerance (oral): Secondary | ICD-10-CM

## 2010-08-26 LAB — URINALYSIS, ROUTINE W REFLEX MICROSCOPIC
Bilirubin Urine: NEGATIVE
Hgb urine dipstick: NEGATIVE
Ketones, ur: NEGATIVE
Leukocytes, UA: NEGATIVE
Nitrite: NEGATIVE
Specific Gravity, Urine: 1.01 (ref 1.000–1.030)
Total Protein, Urine: NEGATIVE
Urine Glucose: NEGATIVE
Urobilinogen, UA: 0.2 (ref 0.0–1.0)
pH: 5.5 (ref 5.0–8.0)

## 2010-08-26 LAB — BASIC METABOLIC PANEL
BUN: 14 mg/dL (ref 6–23)
CO2: 26 mEq/L (ref 19–32)
Calcium: 9.3 mg/dL (ref 8.4–10.5)
Chloride: 107 mEq/L (ref 96–112)
Creatinine, Ser: 0.6 mg/dL (ref 0.4–1.2)
GFR: 122.12 mL/min (ref >60.00)
Glucose, Bld: 144 mg/dL — ABNORMAL HIGH (ref 70–99)
Potassium: 3.8 mEq/L (ref 3.5–5.1)
Sodium: 142 mEq/L (ref 135–145)

## 2010-08-26 LAB — HEMOGLOBIN A1C: Hgb A1c MFr Bld: 7.1 % — ABNORMAL HIGH (ref 4.6–6.5)

## 2010-08-26 LAB — HEPATIC FUNCTION PANEL
ALT: 27 U/L (ref 0–35)
AST: 28 U/L (ref 0–37)
Albumin: 4.2 g/dL (ref 3.5–5.2)
Alkaline Phosphatase: 113 U/L (ref 39–117)
Bilirubin, Direct: 0 mg/dL (ref 0.0–0.3)
Total Bilirubin: 0.6 mg/dL (ref 0.3–1.2)
Total Protein: 7.3 g/dL (ref 6.0–8.3)

## 2010-08-26 LAB — CBC WITH DIFFERENTIAL/PLATELET
Basophils Absolute: 0 10*3/uL (ref 0.0–0.1)
Basophils Relative: 0.5 % (ref 0.0–3.0)
Eosinophils Absolute: 0.1 10*3/uL (ref 0.0–0.7)
Eosinophils Relative: 2.2 % (ref 0.0–5.0)
HCT: 40 % (ref 36.0–46.0)
Hemoglobin: 12.8 g/dL (ref 12.0–15.0)
Lymphocytes Relative: 23.9 % (ref 12.0–46.0)
Lymphs Abs: 1.5 10*3/uL (ref 0.7–4.0)
MCHC: 31.9 g/dL (ref 30.0–36.0)
MCV: 76.8 fl — ABNORMAL LOW (ref 78.0–100.0)
Monocytes Absolute: 0.3 10*3/uL (ref 0.1–1.0)
Monocytes Relative: 5.3 % (ref 3.0–12.0)
Neutro Abs: 4.2 10*3/uL (ref 1.4–7.7)
Neutrophils Relative %: 68.1 % (ref 43.0–77.0)
Platelets: 216 10*3/uL (ref 150.0–400.0)
RBC: 5.2 Mil/uL — ABNORMAL HIGH (ref 3.87–5.11)
RDW: 18.5 % — ABNORMAL HIGH (ref 11.5–14.6)
WBC: 6.1 10*3/uL (ref 4.5–10.5)

## 2010-08-26 LAB — LIPID PANEL
Cholesterol: 248 mg/dL — ABNORMAL HIGH (ref 0–200)
HDL: 47.4 mg/dL (ref >39.00)
Total CHOL/HDL Ratio: 5
Triglycerides: 398 mg/dL — ABNORMAL HIGH (ref 0.0–149.0)
VLDL: 79.6 mg/dL — ABNORMAL HIGH (ref 0.0–40.0)

## 2010-08-26 LAB — LDL CHOLESTEROL, DIRECT: Direct LDL: 133.7 mg/dL

## 2010-08-26 LAB — TSH: TSH: 0.45 u[IU]/mL (ref 0.35–5.50)

## 2010-08-30 ENCOUNTER — Ambulatory Visit (INDEPENDENT_AMBULATORY_CARE_PROVIDER_SITE_OTHER): Payer: BC Managed Care – PPO | Admitting: Internal Medicine

## 2010-08-30 ENCOUNTER — Encounter: Payer: Self-pay | Admitting: Internal Medicine

## 2010-08-30 VITALS — BP 112/70 | HR 74 | Temp 98.4°F | Ht 69.0 in | Wt 175.0 lb

## 2010-08-30 DIAGNOSIS — O2493 Unspecified diabetes mellitus in the puerperium: Secondary | ICD-10-CM | POA: Insufficient documentation

## 2010-08-30 DIAGNOSIS — E785 Hyperlipidemia, unspecified: Secondary | ICD-10-CM

## 2010-08-30 DIAGNOSIS — IMO0001 Reserved for inherently not codable concepts without codable children: Secondary | ICD-10-CM

## 2010-08-30 DIAGNOSIS — Z0000 Encounter for general adult medical examination without abnormal findings: Secondary | ICD-10-CM

## 2010-08-30 DIAGNOSIS — Z79899 Other long term (current) drug therapy: Secondary | ICD-10-CM

## 2010-08-30 DIAGNOSIS — Z23 Encounter for immunization: Secondary | ICD-10-CM

## 2010-08-30 DIAGNOSIS — E119 Type 2 diabetes mellitus without complications: Secondary | ICD-10-CM | POA: Insufficient documentation

## 2010-08-30 HISTORY — DX: Unspecified diabetes mellitus in the puerperium: O24.93

## 2010-08-30 HISTORY — DX: Reserved for inherently not codable concepts without codable children: IMO0001

## 2010-08-30 MED ORDER — ATORVASTATIN CALCIUM 10 MG PO TABS: 10.0000 mg | ORAL_TABLET | Freq: Every day | ORAL | Status: DC

## 2010-08-30 MED ORDER — PNEUMOCOCCAL VAC POLYVALENT 25 MCG/0.5ML IJ INJ: 0.5000 mL | INJECTION | Freq: Once | INTRAMUSCULAR | Status: DC

## 2010-08-30 MED ORDER — METFORMIN HCL 500 MG PO TABS: 500.0000 mg | ORAL_TABLET | Freq: Every day | ORAL | Status: DC

## 2010-08-30 NOTE — Assessment & Plan Note (Signed)
Mild, to start metformin 50 qd

## 2010-08-30 NOTE — Assessment & Plan Note (Signed)
Diet not working for control;  To start lipitor 10 mg daily, f/u labs as directed

## 2010-08-30 NOTE — Assessment & Plan Note (Signed)
Overall doing well, age appropriate education and counseling updated, referrals for preventative services and immunizations addressed, dietary and smoking counseling addressed, most recent labs and ECG reviewed.  I have personally reviewed and have noted: 1) the patient's medical and social history 2) The pt's use of alcohol, tobacco, and illicit drugs 3) The patient's current medications and supplements 4) Functional ability including ADL's, fall risk, home safety risk, hearing and visual impairment 5) Diet and physical activities 6) Evidence for depression or mood disorder 7) The patient's height, weight, and BMI have been recorded in the chart I have made referrals, and provided counseling and education based on review of the above Also due for pneumovax in light of the DM

## 2010-08-30 NOTE — Patient Instructions (Addendum)
You had the pneumonia shot today Take all new medications as prescribed Continue all other medications as before Please return in 6 mo with Lab testing done 3-5 days before

## 2010-08-30 NOTE — Progress Notes (Signed)
Subjective:    Patient ID: Stacey [name], female    DOB: Jul 12, 1950, 60 y.o.   MRN: [id number]  HPIHere for wellness and f/u;  Overall doing ok;  Pt denies CP, worsening SOB, DOE, wheezing, orthopnea, PND, worsening LE edema, palpitations, dizziness or syncope.  Pt denies neurological change such as new Headache, facial or extremity weakness.  Pt denies polydipsia, polyuria, or low sugar symptoms. Pt states overall good compliance with treatment and medications, good tolerability, and trying to follow lower cholesterol diet.  Pt denies worsening depressive symptoms, suicidal ideation or panic. No fever, wt loss, night sweats, loss of appetite, or other constitutional symptoms.  Pt states good ability with ADL's, low fall risk, home safety reviewed and adequate, no significant changes in hearing or vision, and occasionally active with exercise. Past Medical History  Diagnosis Date  . HYPERLIPIDEMIA 08/01/2006  . OBESITY 09/16/2006  . SICKLE CELL ANEMIA 08/01/2006  . ANXIETY 09/16/2006  . DEPRESSION 09/16/2006  . HYPERTENSION 08/01/2006  . 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 s/p septal myomectomy 4/12 at [facility] with Dr. Silvestre Mesi;  b. echo 5/12: EF 60-65%, LVOT peak 18 mmHg; grade 1 diast dysfxn, mild SAM (improved since myomectomy)  . BRADYCARDIA 10/13/2009  . Diastolic heart failure 09/16/2006  . ALLERGIC RHINITIS 12/07/2006  . ASTHMA 08/01/2006  . VENTRAL HERNIA 12/07/2006  . GASTROINTESTINAL HEMORRHAGE 10/13/2009  . ANGIOEDEMA 03/07/2008  . ABDOMINAL WALL CONTUSION 02/18/2010  . Wheezing 04/01/2010  . DIVERTICULOSIS-COLON 04/06/2010  . DIVERTICULOSIS, COLON, WITH HEMORRHAGE 04/06/2010  . RECTAL BLEEDING 04/06/2010  . CAD (coronary artery disease)     Minimal plaque at cardiac catheterization 1/12: CFX 20%, EF 70%  . LBBB (left bundle branch block) 06/17/2010  . CONGESTIVE HEART FAILURE 04/06/2010  . Impaired glucose tolerance 06/25/2010   Past Surgical History  Procedure Date    . Abdominal hysterectomy 1987  . Ventral hernia repair 06/2006    reports that she has quit smoking. She does not have any smokeless tobacco history on file. She reports that she drinks alcohol. She reports that she does not use illicit drugs. family history includes Cancer in her daughter. Allergies  Allergen Reactions  . Ace Inhibitors     REACTION: angioedema right eye  . Aspirin   . Codeine   . Soy Allergy    Review of Systems Review of Systems  Constitutional: Negative for diaphoresis, activity change, appetite change and unexpected weight change.  HENT: Negative for hearing loss, ear pain, facial swelling, mouth sores and neck stiffness.   Eyes: Negative for pain, redness and visual disturbance.  Respiratory: Negative for shortness of breath and wheezing.   Cardiovascular: Negative for chest pain and palpitations.  Gastrointestinal: Negative for diarrhea, blood in stool, abdominal distention and rectal pain.  Genitourinary: Negative for hematuria, flank pain and decreased urine volume.  Musculoskeletal: Negative for myalgias and joint swelling.  Skin: Negative for color change and wound.  Neurological: Negative for syncope and numbness.  Hematological: Negative for adenopathy.  Psychiatric/Behavioral: Negative for hallucinations, self-injury, decreased concentration and agitation.      Objective:   Physical Exam BP 112/70  Pulse 74  Temp(Src) 98.4 F (36.9 C) (Oral)  Ht 5\' 9"  (1.753 m)  Wt 175 lb (79.379 kg)  BMI 25.84 kg/m2  SpO2 94% Physical Exam  VS noted Constitutional: Pt is oriented to person, place, and time. Appears well-developed and well-nourished.  HENT:  Head: Normocephalic and atraumatic.  Right Ear: External ear normal.  Left Ear: External  ear normal.  Nose: Nose normal.  Mouth/Throat: Oropharynx is clear and moist.  Eyes: Conjunctivae and EOM are normal. Pupils are equal, round, and reactive to light.  Neck: Normal range of motion. Neck supple. No  JVD present. No tracheal deviation present.  Cardiovascular: Normal rate, regular rhythm, normal heart sounds and intact distal pulses.   Pulmonary/Chest: Effort normal and breath sounds normal.  Abdominal: Soft. Bowel sounds are normal. There is no tenderness.  Musculoskeletal: Normal range of motion. Exhibits no edema.  Lymphadenopathy:  Has no cervical adenopathy.  Neurological: Pt is alert and oriented to person, place, and time. Pt has normal reflexes. No cranial nerve deficit.  Skin: Skin is warm and dry. No rash noted.  Psychiatric:  Has  normal mood and affect. Behavior is normal.         Assessment & Plan:

## 2010-09-14 ENCOUNTER — Ambulatory Visit (INDEPENDENT_AMBULATORY_CARE_PROVIDER_SITE_OTHER): Payer: BC Managed Care – PPO | Admitting: Cardiology

## 2010-09-14 ENCOUNTER — Encounter: Payer: Self-pay | Admitting: Cardiology

## 2010-09-14 VITALS — BP 121/69 | HR 77 | Ht 69.0 in | Wt 174.0 lb

## 2010-09-14 DIAGNOSIS — I498 Other specified cardiac arrhythmias: Secondary | ICD-10-CM

## 2010-09-14 DIAGNOSIS — I10 Essential (primary) hypertension: Secondary | ICD-10-CM

## 2010-09-14 DIAGNOSIS — I421 Obstructive hypertrophic cardiomyopathy: Secondary | ICD-10-CM

## 2010-09-14 NOTE — Assessment & Plan Note (Signed)
She is doing well.  I will repeat and echo when I see her back in six months.

## 2010-09-14 NOTE — Patient Instructions (Signed)
Follow up in 6 months with Dr Antoine Poche.  You will receive a letter in the mail 2 months before you are due.  Please call us when you receive this letter to schedule your follow up appointment.  Your physician has requested that you have an echocardiogram in 6 months at the time of your next appointment. Echocardiography is a painless test that uses sound waves to create images of your heart. It provides your doctor with information about the size and shape of your heart and how well your heart's chambers and valves are working. This procedure takes approximately one hour. There are no restrictions for this procedure.  The current medical regimen is effective;  continue present plan and medications.

## 2010-09-14 NOTE — Assessment & Plan Note (Signed)
The blood pressure is at target. No change in medications is indicated. We will continue with therapeutic lifestyle changes (TLC).  

## 2010-09-14 NOTE — Assessment & Plan Note (Signed)
She has had no symptomatic bradycardia arrhythmias. No change in therapy is indicated.

## 2010-09-14 NOTE — Progress Notes (Signed)
HPI The patient presents for follow up after a myomectomy for HCM.  Since she was last here she feels great.  She is going to join a gym.  The patient denies any new symptoms such as chest discomfort, neck or arm discomfort. There has been no new shortness of breath, PND or orthopnea. There have been no reported palpitations, presyncope or syncope.  Allergies  Allergen Reactions  . Ace Inhibitors     REACTION: angioedema right eye  . Aspirin   . Codeine   . Soy Allergy     Current Outpatient Prescriptions  Medication Sig Dispense Refill  . acetaminophen (TYLENOL 8 HOUR) 650 MG CR tablet 1 by mouth every 4 hours as needed       . atorvastatin (LIPITOR) 10 MG tablet Take 1 tablet (10 mg total) by mouth daily.  90 tablet  3  . cetirizine (ZYRTEC) 10 MG tablet Take 10 mg by mouth daily.        . furosemide (LASIX) 20 MG tablet Take 1 tablet (20 mg total) by mouth daily.  90 tablet  3  . gabapentin (NEURONTIN) 300 MG capsule Take 300 mg by mouth 3 (three) times daily.        . metFORMIN (GLUCOPHAGE) 500 MG tablet Take 1 tablet (500 mg total) by mouth daily with breakfast.  90 tablet  3  . metoprolol (TOPROL-XL) 100 MG 24 hr tablet Take 100 mg by mouth daily.        Marland Kitchen oxycodone (OXY-IR) 5 MG capsule Take 5 mg by mouth every 4 (four) hours as needed.        . potassium chloride SA (K-DUR,KLOR-CON) 20 MEQ tablet Take 1 tablet (20 mEq total) by mouth daily.  30 tablet  11  . sennosides-docusate sodium (SENOKOT-S) 8.6-50 MG tablet Take 2 tablets by mouth as needed.        . topiramate (TOPAMAX) 200 MG tablet Take 200 mg by mouth 2 (two) times daily.         Current Facility-Administered Medications  Medication Dose Route Frequency Provider Last Rate Last Dose  . pneumococcal 23 valent vaccine (PNU-IMMUNE) injection 0.5 mL  0.5 mL Intramuscular Once Cathlean Cower, MD        Past Medical History  Diagnosis Date  . HYPERLIPIDEMIA 08/01/2006  . OBESITY 09/16/2006  . SICKLE CELL ANEMIA 08/01/2006  .  ANXIETY 09/16/2006  . DEPRESSION 09/16/2006  . HYPERTENSION 08/01/2006  . 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 s/p septal myomectomy 4/12 at [place] with Dr. Evelina Dun;  b. echo 5/12: EF 60-65%, LVOT peak 18 mmHg; grade 1 diast dysfxn, mild SAM (improved since myomectomy)  . BRADYCARDIA 10/13/2009  . Diastolic heart failure Q000111Q  . ALLERGIC RHINITIS 12/07/2006  . ASTHMA 08/01/2006  . VENTRAL HERNIA 12/07/2006  . GASTROINTESTINAL HEMORRHAGE 10/13/2009  . ANGIOEDEMA 03/07/2008  . ABDOMINAL WALL CONTUSION 02/18/2010  . Wheezing 04/01/2010  . DIVERTICULOSIS-COLON 04/06/2010  . DIVERTICULOSIS, COLON, WITH HEMORRHAGE 04/06/2010  . RECTAL BLEEDING 04/06/2010  . CAD (coronary artery disease)     Minimal plaque at cardiac catheterization 1/12: CFX 20%, EF 70%  . LBBB (left bundle branch block) 06/17/2010  . CONGESTIVE HEART FAILURE 04/06/2010  . Impaired glucose tolerance 06/25/2010  . DM (diabetes mellitus) in pregnancy, delivered w/postpartum condition 08/30/2010  . Type II or unspecified type diabetes mellitus without mention of complication, uncontrolled 08/30/2010    Past Surgical History  Procedure Date  . Abdominal hysterectomy 1987  . Ventral hernia repair  06/2006  . Myomectomy     Septal    ROS:  As stated in the HPI and negative for all other systems.;  PHYSICAL EXAM BP 121/69  Pulse 77  Ht 5' 9"$  (1.753 m)  Wt 174 lb (78.926 kg)  BMI 25.70 kg/m2 GENERAL:  Well appearing HEENT:  Pupils equal round and reactive, fundi not visualized, oral mucosa unremarkable NECK:  No jugular venous distention, waveform within normal limits, carotid upstroke brisk and symmetric, no bruits, no thyromegaly LYMPHATICS:  No cervical, inguinal adenopathy LUNGS:  Clear to auscultation bilaterally BACK:  No CVA tenderness CHEST:  Well healed sternotomy scar. HEART:  PMI not displaced or sustained,S1 and S2 within normal limits, no S3, no S4, no clicks, no rubs, apical systolic murmur increasing with  the strain phase of Valsalva and radiating out the aortic outflow tract. ABD:  Flat, positive bowel sounds normal in frequency in pitch, no bruits, no rebound, no guarding, no midline pulsatile mass, no hepatomegaly, no splenomegaly EXT:  2 plus pulses throughout, no edema, no cyanosis no clubbing SKIN:  No rashes no nodules NEURO:  Cranial nerves II through XII grossly intact, motor grossly intact throughout PSYCH:  Cognitively intact, oriented to person place and time  ASSESSMENT AND PLAN

## 2010-09-22 ENCOUNTER — Other Ambulatory Visit: Payer: Self-pay

## 2010-09-22 MED ORDER — METOPROLOL SUCCINATE ER 100 MG PO TB24: 100.0000 mg | ORAL_TABLET | Freq: Every day | ORAL | Status: DC

## 2010-11-10 LAB — DIFFERENTIAL
Basophils Absolute: 0.1
Basophils Relative: 1
Eosinophils Absolute: 0.1
Eosinophils Relative: 2
Lymphocytes Relative: 26
Lymphs Abs: 1.9
Monocytes Absolute: 0.4
Monocytes Relative: 6
Neutro Abs: 4.7
Neutrophils Relative %: 65

## 2010-11-10 LAB — CBC
HCT: 38.7
Hemoglobin: 12.8
MCHC: 33.2
MCV: 76.3 — ABNORMAL LOW
Platelets: 210
RBC: 5.07
RDW: 16.9 — ABNORMAL HIGH
WBC: 7.3

## 2010-11-10 LAB — BASIC METABOLIC PANEL
BUN: 11
CO2: 29
Calcium: 9.6
Chloride: 106
Creatinine, Ser: 0.68
GFR calc Af Amer: >60
GFR calc non Af Amer: >60
Glucose, Bld: 133 — ABNORMAL HIGH
Potassium: 4.2
Sodium: 142

## 2010-12-15 ENCOUNTER — Encounter: Payer: Self-pay | Admitting: Internal Medicine

## 2010-12-15 ENCOUNTER — Ambulatory Visit (INDEPENDENT_AMBULATORY_CARE_PROVIDER_SITE_OTHER): Payer: BC Managed Care – PPO | Admitting: Internal Medicine

## 2010-12-15 VITALS — BP 122/82 | HR 75 | Temp 98.0°F | Ht 69.0 in | Wt 173.0 lb

## 2010-12-15 DIAGNOSIS — M79673 Pain in unspecified foot: Secondary | ICD-10-CM | POA: Insufficient documentation

## 2010-12-15 DIAGNOSIS — J019 Acute sinusitis, unspecified: Secondary | ICD-10-CM | POA: Insufficient documentation

## 2010-12-15 DIAGNOSIS — I10 Essential (primary) hypertension: Secondary | ICD-10-CM

## 2010-12-15 DIAGNOSIS — R252 Cramp and spasm: Secondary | ICD-10-CM | POA: Insufficient documentation

## 2010-12-15 DIAGNOSIS — R300 Dysuria: Secondary | ICD-10-CM

## 2010-12-15 DIAGNOSIS — R040 Epistaxis: Secondary | ICD-10-CM | POA: Insufficient documentation

## 2010-12-15 MED ORDER — ACETAMINOPHEN-CODEINE #3 300-30 MG PO TABS: 1.0000 | ORAL_TABLET | Freq: Four times a day (QID) | ORAL | Status: DC | PRN

## 2010-12-15 MED ORDER — CYCLOBENZAPRINE HCL 5 MG PO TABS: 5.0000 mg | ORAL_TABLET | Freq: Three times a day (TID) | ORAL | Status: DC | PRN

## 2010-12-15 MED ORDER — LEVOFLOXACIN 250 MG PO TABS: 250.0000 mg | ORAL_TABLET | Freq: Every day | ORAL | Status: AC

## 2010-12-15 NOTE — Assessment & Plan Note (Signed)
Left side, for podiatry referral, to refill the Tylenol #3 prn

## 2010-12-15 NOTE — Patient Instructions (Addendum)
Take all new medications as prescribed Continue all other medications as before Please consider going to ER if the bleeding re-starts, for packing and followup with ENT

## 2010-12-18 ENCOUNTER — Encounter: Payer: Self-pay | Admitting: Internal Medicine

## 2010-12-18 NOTE — Assessment & Plan Note (Signed)
Mild to mod, for antibx course,  to f/u any worsening symptoms or concerns 

## 2010-12-18 NOTE — Progress Notes (Signed)
Subjective:    Patient ID: Stacey [name], female    DOB: 12-Jun-1950, 60 y.o.   MRN: [id number]  HPI   Here with 3 days acute onset fever, facial pain, pressure, general weakness and malaise, and greenish d/c, with slight ST, but little to no cough and Pt denies chest pain, increased sob or doe, wheezing, orthopnea, PND, increased LE swelling, palpitations, dizziness or syncope, but has had onset bleeding from last nares today as well, still bleeing at this time mild.  Pt denies new neurological symptoms such as new headache, or facial or extremity weakness or numbness   Pt denies polydipsia, polyuria.  Overall good compliance with treatment, and good medicine tolerability. Past Medical History  Diagnosis Date  . HYPERLIPIDEMIA 08/01/2006  . OBESITY 09/16/2006  . SICKLE CELL ANEMIA 08/01/2006  . ANXIETY 09/16/2006  . DEPRESSION 09/16/2006  . HYPERTENSION 08/01/2006  . 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 s/p septal myomectomy 4/12 at [facility] with Dr. Silvestre Mesi;  b. echo 5/12: EF 60-65%, LVOT peak 18 mmHg; grade 1 diast dysfxn, mild SAM (improved since myomectomy)  . BRADYCARDIA 10/13/2009  . Diastolic heart failure 09/16/2006  . ALLERGIC RHINITIS 12/07/2006  . ASTHMA 08/01/2006  . VENTRAL HERNIA 12/07/2006  . GASTROINTESTINAL HEMORRHAGE 10/13/2009  . ANGIOEDEMA 03/07/2008  . ABDOMINAL WALL CONTUSION 02/18/2010  . Wheezing 04/01/2010  . DIVERTICULOSIS-COLON 04/06/2010  . DIVERTICULOSIS, COLON, WITH HEMORRHAGE 04/06/2010  . RECTAL BLEEDING 04/06/2010  . CAD (coronary artery disease)     Minimal plaque at cardiac catheterization 1/12: CFX 20%, EF 70%  . LBBB (left bundle branch block) 06/17/2010  . CONGESTIVE HEART FAILURE 04/06/2010  . Impaired glucose tolerance 06/25/2010  . DM (diabetes mellitus) in pregnancy, delivered w/postpartum condition 08/30/2010  . Type II or unspecified type diabetes mellitus without mention of complication, uncontrolled 08/30/2010   Past Surgical History    Procedure Date  . Abdominal hysterectomy 1987  . Ventral hernia repair 06/2006  . Myomectomy     Septal    reports that she has quit smoking. She does not have any smokeless tobacco history on file. She reports that she drinks alcohol. She reports that she does not use illicit drugs. family history includes Cancer in her daughter. Allergies  Allergen Reactions  . Ace Inhibitors     REACTION: angioedema right eye  . Aspirin   . Codeine   . Soy Allergy    Current Outpatient Prescriptions on File Prior to Visit  Medication Sig Dispense Refill  . acetaminophen (TYLENOL 8 HOUR) 650 MG CR tablet 1 by mouth every 4 hours as needed       . atorvastatin (LIPITOR) 10 MG tablet Take 1 tablet (10 mg total) by mouth daily.  90 tablet  3  . cetirizine (ZYRTEC) 10 MG tablet Take 10 mg by mouth daily.        . furosemide (LASIX) 20 MG tablet Take 1 tablet (20 mg total) by mouth daily.  90 tablet  3  . gabapentin (NEURONTIN) 300 MG capsule Take 300 mg by mouth 3 (three) times daily.        . metFORMIN (GLUCOPHAGE) 500 MG tablet Take 1 tablet (500 mg total) by mouth daily with breakfast.  90 tablet  3  . metoprolol (TOPROL-XL) 100 MG 24 hr tablet Take 1 tablet (100 mg total) by mouth daily.  30 tablet  11  . potassium chloride SA (K-DUR,KLOR-CON) 20 MEQ tablet Take 1 tablet (20 mEq total) by mouth daily.  30  tablet  11  . sennosides-docusate sodium (SENOKOT-S) 8.6-50 MG tablet Take 2 tablets by mouth as needed.        . topiramate (TOPAMAX) 200 MG tablet Take 200 mg by mouth 2 (two) times daily.         Review of Systems Review of Systems  Constitutional: Negative for diaphoresis and unexpected weight change.  HENT: Negative for drooling and tinnitus.   Eyes: Negative for photophobia and visual disturbance.  Respiratory: Negative for choking and stridor.   Gastrointestinal: Negative for vomiting and blood in stool.  Genitourinary: Negative for hematuria and decreased urine volume.     Objective:    Physical Exam BP 122/82  Pulse 75  Temp(Src) 98 F (36.7 C) (Oral)  Ht 5\' 9"  (1.753 m)  Wt 173 lb (78.472 kg)  BMI 25.55 kg/m2  SpO2 95% Physical Exam  VS noted Constitutional: Pt appears well-developed and well-nourished.  HENT: Head: Normocephalic.  Right Ear: External ear normal.  Left Ear: External ear normal.  Eyes: Conjunctivae and EOM are normal. Pupils are equal, round, and reactive to light.  Neck: Normal range of motion. Neck supple.  Bilat tm's mild erythema.  Sinus tender bilat.  Pharynx mild erythema Left nares mild epistaxis controlled with OTC med cautery tx Cardiovascular: Normal rate and regular rhythm.   Pulmonary/Chest: Effort normal and breath sounds normal.  Neurological: Pt is alert. No cranial nerve deficit.  Skin: Skin is warm. No erythema.  Psychiatric: Pt behavior is normal. Thought content normal.     Assessment & Plan:

## 2010-12-18 NOTE — Assessment & Plan Note (Signed)
Controlled with med tx in the office,  to f/u any worsening symptoms or concerns

## 2010-12-18 NOTE — Assessment & Plan Note (Signed)
stable overall by hx and exam, most recent data reviewed with pt, and pt to continue medical treatment as before  BP Readings from Last 3 Encounters:  12/15/10 122/82  09/14/10 121/69  08/30/10 112/70

## 2011-01-14 ENCOUNTER — Telehealth: Payer: Self-pay | Admitting: Cardiology

## 2011-01-19 ENCOUNTER — Encounter: Payer: Self-pay | Admitting: *Deleted

## 2011-01-19 ENCOUNTER — Encounter: Payer: Self-pay | Admitting: Cardiology

## 2011-01-19 NOTE — Telephone Encounter (Signed)
Pt states she broke a tooth and needs it fixed today due to nerve involvement.  The procedure is scheduled for noon today and is being done with a local anesthetic.  They need a surgical clearance faxed this morning.

## 2011-01-19 NOTE — Telephone Encounter (Signed)
Patient called, stating dentist has received clearance.States she is on her way to appointment.Spoke with Dr. Jenene Slicker nurse she will sent clearance as soon as she can ,still seeing patients.

## 2011-01-19 NOTE — Telephone Encounter (Signed)
FU Call: Pt calling stating that she needs surgical clearance for dental procedure. Pt said she chipped a tooth on the nerve and needs clearance to get tooth extracted. Please fax surgical clearance to [facility].  Please return pt call to inform if and when surgical clearance is sent.

## 2011-01-19 NOTE — Telephone Encounter (Signed)
F/up message:  Pt has called again stating that the dentist has not received information and she is about to go to appt.  Please send ASAP.

## 2011-01-19 NOTE — Telephone Encounter (Signed)
error 

## 2011-01-19 NOTE — Telephone Encounter (Signed)
Forwarded to Dr Antoine Poche and his nurse was notified.

## 2011-01-19 NOTE — Telephone Encounter (Signed)
Fax number is: [phone].

## 2011-03-04 ENCOUNTER — Encounter: Payer: Self-pay | Admitting: Internal Medicine

## 2011-03-04 ENCOUNTER — Other Ambulatory Visit (INDEPENDENT_AMBULATORY_CARE_PROVIDER_SITE_OTHER): Payer: BC Managed Care – PPO

## 2011-03-04 ENCOUNTER — Ambulatory Visit (INDEPENDENT_AMBULATORY_CARE_PROVIDER_SITE_OTHER): Payer: BC Managed Care – PPO | Admitting: Internal Medicine

## 2011-03-04 VITALS — BP 120/80 | HR 65 | Temp 97.5°F | Ht 69.0 in | Wt 171.0 lb

## 2011-03-04 DIAGNOSIS — Z0000 Encounter for general adult medical examination without abnormal findings: Secondary | ICD-10-CM

## 2011-03-04 DIAGNOSIS — IMO0001 Reserved for inherently not codable concepts without codable children: Secondary | ICD-10-CM

## 2011-03-04 DIAGNOSIS — Z79899 Other long term (current) drug therapy: Secondary | ICD-10-CM

## 2011-03-04 DIAGNOSIS — I10 Essential (primary) hypertension: Secondary | ICD-10-CM

## 2011-03-04 DIAGNOSIS — E785 Hyperlipidemia, unspecified: Secondary | ICD-10-CM

## 2011-03-04 DIAGNOSIS — J069 Acute upper respiratory infection, unspecified: Secondary | ICD-10-CM

## 2011-03-04 LAB — HEPATIC FUNCTION PANEL
ALT: 24 U/L (ref 0–35)
AST: 19 U/L (ref 0–37)
Albumin: 4.3 g/dL (ref 3.5–5.2)
Alkaline Phosphatase: 132 U/L — ABNORMAL HIGH (ref 39–117)
Bilirubin, Direct: 0.1 mg/dL (ref 0.0–0.3)
Total Bilirubin: 0.5 mg/dL (ref 0.3–1.2)
Total Protein: 7.9 g/dL (ref 6.0–8.3)

## 2011-03-04 LAB — BASIC METABOLIC PANEL
BUN: 15 mg/dL (ref 6–23)
CO2: 28 mEq/L (ref 19–32)
Calcium: 9.2 mg/dL (ref 8.4–10.5)
Chloride: 108 mEq/L (ref 96–112)
Creatinine, Ser: 0.8 mg/dL (ref 0.4–1.2)
GFR: 97.02 mL/min (ref >60.00)
Glucose, Bld: 120 mg/dL — ABNORMAL HIGH (ref 70–99)
Potassium: 3.9 mEq/L (ref 3.5–5.1)
Sodium: 141 mEq/L (ref 135–145)

## 2011-03-04 LAB — LIPID PANEL
Cholesterol: 171 mg/dL (ref 0–200)
HDL: 48.3 mg/dL (ref >39.00)
LDL Cholesterol: 84 mg/dL (ref 0–99)
Total CHOL/HDL Ratio: 4
Triglycerides: 193 mg/dL — ABNORMAL HIGH (ref 0.0–149.0)
VLDL: 38.6 mg/dL (ref 0.0–40.0)

## 2011-03-04 LAB — HEMOGLOBIN A1C: Hgb A1c MFr Bld: 6.9 % — ABNORMAL HIGH (ref 4.6–6.5)

## 2011-03-04 NOTE — Patient Instructions (Signed)
Continue all other medications as before Please go to LAB in the Basement for the blood and/or urine tests to be done today Please call the phone number [phone] (the [employer] System) for results of testing in 2-3 days;  When calling, simply dial the number, and when prompted enter the MRN number above (the Medical Record Number) and the # key, then the message should start. Please return in 6 mo with Lab testing done 3-5 days before  

## 2011-03-05 ENCOUNTER — Encounter: Payer: Self-pay | Admitting: Internal Medicine

## 2011-03-05 DIAGNOSIS — J069 Acute upper respiratory infection, unspecified: Secondary | ICD-10-CM | POA: Insufficient documentation

## 2011-03-05 NOTE — Progress Notes (Signed)
Subjective:    Patient ID: Stacey [name], female    DOB: Mar 16, 1950, 61 y.o.   MRN: [id number]  HPI  Here to f/u; overall doing ok,  Pt denies chest pain, increased sob or doe, wheezing, orthopnea, PND, increased LE swelling, palpitations, dizziness or syncope.  Pt denies new neurological symptoms such as new headache, or facial or extremity weakness or numbness   Pt denies polydipsia, polyuria, or low sugar symptoms such as weakness or confusion improved with po intake.  Pt states overall good compliance with meds, trying to follow lower cholesterol, diabetic diet, wt overall stable but little exercise however.  Also with incidentlal mild URI symtpoms with head congestion, mild nonprod cough, malaise and fatigue for 2 days Past Medical History  Diagnosis Date  . HYPERLIPIDEMIA 08/01/2006  . OBESITY 09/16/2006  . SICKLE CELL ANEMIA 08/01/2006  . ANXIETY 09/16/2006  . DEPRESSION 09/16/2006  . HYPERTENSION 08/01/2006  . 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 s/p septal myomectomy 4/12 at [facility] with Dr. Silvestre Mesi;  b. echo 5/12: EF 60-65%, LVOT peak 18 mmHg; grade 1 diast dysfxn, mild SAM (improved since myomectomy)  . BRADYCARDIA 10/13/2009  . Diastolic heart failure 09/16/2006  . ALLERGIC RHINITIS 12/07/2006  . ASTHMA 08/01/2006  . VENTRAL HERNIA 12/07/2006  . GASTROINTESTINAL HEMORRHAGE 10/13/2009  . ANGIOEDEMA 03/07/2008  . ABDOMINAL WALL CONTUSION 02/18/2010  . Wheezing 04/01/2010  . DIVERTICULOSIS-COLON 04/06/2010  . DIVERTICULOSIS, COLON, WITH HEMORRHAGE 04/06/2010  . RECTAL BLEEDING 04/06/2010  . CAD (coronary artery disease)     Minimal plaque at cardiac catheterization 1/12: CFX 20%, EF 70%  . LBBB (left bundle branch block) 06/17/2010  . CONGESTIVE HEART FAILURE 04/06/2010  . Impaired glucose tolerance 06/25/2010  . DM (diabetes mellitus) in pregnancy, delivered w/postpartum condition 08/30/2010  . Type II or unspecified type diabetes mellitus without mention of complication,  uncontrolled 08/30/2010   Past Surgical History  Procedure Date  . Abdominal hysterectomy 1987  . Ventral hernia repair 06/2006  . Myomectomy     Septal    reports that she has quit smoking. She does not have any smokeless tobacco history on file. She reports that she drinks alcohol. She reports that she does not use illicit drugs. family history includes Cancer in her daughter. Allergies  Allergen Reactions  . Ace Inhibitors     REACTION: angioedema right eye  . Aspirin   . Codeine   . Soy Allergy    Current Outpatient Prescriptions on File Prior to Visit  Medication Sig Dispense Refill  . acetaminophen (TYLENOL 8 HOUR) 650 MG CR tablet 1 by mouth every 4 hours as needed       . atorvastatin (LIPITOR) 10 MG tablet Take 1 tablet (10 mg total) by mouth daily.  90 tablet  3  . cetirizine (ZYRTEC) 10 MG tablet Take 10 mg by mouth daily.        . furosemide (LASIX) 20 MG tablet Take 1 tablet (20 mg total) by mouth daily.  90 tablet  3  . metFORMIN (GLUCOPHAGE) 500 MG tablet Take 1 tablet (500 mg total) by mouth daily with breakfast.  90 tablet  3  . metoprolol (TOPROL-XL) 100 MG 24 hr tablet Take 1 tablet (100 mg total) by mouth daily.  30 tablet  11  . potassium chloride SA (K-DUR,KLOR-CON) 20 MEQ tablet Take 1 tablet (20 mEq total) by mouth daily.  30 tablet  11  . sennosides-docusate sodium (SENOKOT-S) 8.6-50 MG tablet Take 2 tablets by mouth  as needed.        . topiramate (TOPAMAX) 200 MG tablet Take 200 mg by mouth 2 (two) times daily.        Marland Kitchen gabapentin (NEURONTIN) 300 MG capsule Take 300 mg by mouth 3 (three) times daily.         Review of Systems Review of Systems  Constitutional: Negative for diaphoresis and unexpected weight change.  HENT: Negative for drooling and tinnitus.   Eyes: Negative for photophobia and visual disturbance.  Respiratory: Negative for choking and stridor.   Gastrointestinal: Negative for vomiting and blood in stool.  Genitourinary: Negative for  hematuria and decreased urine volume.     Objective:   Physical Exam BP 120/80  Pulse 65  Temp(Src) 97.5 F (36.4 C) (Oral)  Ht 5\' 9"  (1.753 m)  Wt 171 lb (77.565 kg)  BMI 25.25 kg/m2  SpO2 95% Physical Exam  VS noted, mild ill Constitutional: Pt appears well-developed and well-nourished.  HENT: Head: Normocephalic.  Right Ear: External ear normal.  Left Ear: External ear normal.  Bilat tm's mild erythema.  Sinus nontender.  Pharynx mild erythema Eyes: Conjunctivae and EOM are normal. Pupils are equal, round, and reactive to light.  Neck: Normal range of motion. Neck supple.  Cardiovascular: Normal rate and regular rhythm.   Pulmonary/Chest: Effort normal and breath sounds normal.  Neurological: Pt is alert. No cranial nerve deficit.  Skin: Skin is warm. No erythema.  Psychiatric: Pt behavior is normal. Thought content normal.     Assessment & Plan:

## 2011-03-05 NOTE — Assessment & Plan Note (Signed)
Mild to mod, for mucinex and tylenol otc prn,  to f/u any worsening symptoms or concerns

## 2011-03-05 NOTE — Assessment & Plan Note (Signed)
stable overall by hx and exam, most recent data reviewed with pt, and pt to continue medical treatment as before  BP Readings from Last 3 Encounters:  03/04/11 120/80  12/15/10 122/82  09/14/10 121/69

## 2011-03-05 NOTE — Assessment & Plan Note (Signed)
stable overall by hx and exam, most recent data reviewed with pt, and pt to continue medical treatment as before Lab Results  Component Value Date   LDLCALC 84 03/04/2011

## 2011-04-13 ENCOUNTER — Encounter: Payer: Self-pay | Admitting: Internal Medicine

## 2011-04-13 ENCOUNTER — Ambulatory Visit (INDEPENDENT_AMBULATORY_CARE_PROVIDER_SITE_OTHER): Payer: BC Managed Care – PPO | Admitting: Internal Medicine

## 2011-04-13 VITALS — BP 120/82 | HR 78 | Temp 99.6°F | Ht 69.0 in | Wt 167.2 lb

## 2011-04-13 DIAGNOSIS — I10 Essential (primary) hypertension: Secondary | ICD-10-CM

## 2011-04-13 DIAGNOSIS — J209 Acute bronchitis, unspecified: Secondary | ICD-10-CM

## 2011-04-13 DIAGNOSIS — IMO0001 Reserved for inherently not codable concepts without codable children: Secondary | ICD-10-CM

## 2011-04-13 DIAGNOSIS — R062 Wheezing: Secondary | ICD-10-CM

## 2011-04-13 MED ORDER — ALBUTEROL SULFATE HFA 108 (90 BASE) MCG/ACT IN AERS: 2.0000 | INHALATION_SPRAY | RESPIRATORY_TRACT | Status: DC | PRN

## 2011-04-13 MED ORDER — LEVOFLOXACIN 250 MG PO TABS: 250.0000 mg | ORAL_TABLET | Freq: Every day | ORAL | Status: AC

## 2011-04-13 MED ORDER — HYDROCOD POLST-CHLORPHEN POLST 10-8 MG/5ML PO LQCR: 5.0000 mL | Freq: Two times a day (BID) | ORAL | Status: DC | PRN

## 2011-04-13 MED ORDER — PREDNISONE 10 MG PO TABS: 10.0000 mg | ORAL_TABLET | Freq: Every day | ORAL | Status: DC

## 2011-04-13 MED ORDER — PREDNISONE 10 MG PO TABS: ORAL_TABLET | ORAL | Status: DC

## 2011-04-13 NOTE — Patient Instructions (Signed)
Take all new medications as prescribed - the antibiotic, cough medicine, prednisone, and inhaler as needed Continue all other medications as before

## 2011-04-17 ENCOUNTER — Encounter: Payer: Self-pay | Admitting: Internal Medicine

## 2011-04-17 NOTE — Assessment & Plan Note (Signed)
Mild to mod, for antibx course,  to f/u any worsening symptoms or concerns 

## 2011-04-17 NOTE — Assessment & Plan Note (Signed)
stable overall by hx and exam, most recent data reviewed with pt, and pt to continue medical treatment as before  Lab Results  Component Value Date   HGBA1C 6.9* 03/04/2011   To call with onset polys and to call for cbg > 200 on steroid tx

## 2011-04-17 NOTE — Assessment & Plan Note (Signed)
Mild to mod, for predpack,  to f/u any worsening symptoms or concerns 

## 2011-04-17 NOTE — Assessment & Plan Note (Signed)
stable overall by hx and exam, most recent data reviewed with pt, and pt to continue medical treatment as before  BP Readings from Last 3 Encounters:  04/13/11 120/82  03/04/11 120/80  12/15/10 122/82

## 2011-04-17 NOTE — Progress Notes (Signed)
Subjective:    Patient ID: Stacey [name], female    DOB: February 16, 1950, 61 y.o.   MRN: [id number]  HPI    Here with acute onset mild to mod 2-3 days ST, HA, general weakness and malaise, with prod cough greenish sputum, but Pt denies chest pain, increased sob or doe, orthopnea, PND, increased LE swelling, palpitations, dizziness or syncope but has onset mild wheezing today.   Pt denies new neurological symptoms such as new headache, or facial or extremity weakness or numbness   Pt denies polydipsia, polyuria, or low sugar symptoms such as weakness or confusion improved with po intake.  Pt states overall good compliance with meds, trying to follow lower cholesterol, diabetic diet, wt overall stable but little exercise however.   CBG's in lower 100's.   Past Medical History  Diagnosis Date  . HYPERLIPIDEMIA 08/01/2006  . OBESITY 09/16/2006  . SICKLE CELL ANEMIA 08/01/2006  . ANXIETY 09/16/2006  . DEPRESSION 09/16/2006  . HYPERTENSION 08/01/2006  . 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 s/p septal myomectomy 4/12 at [place] with Dr. Evelina Dun;  b. echo 5/12: EF 60-65%, LVOT peak 18 mmHg; grade 1 diast dysfxn, mild SAM (improved since myomectomy)  . BRADYCARDIA 10/13/2009  . Diastolic heart failure Q000111Q  . ALLERGIC RHINITIS 12/07/2006  . ASTHMA 08/01/2006  . VENTRAL HERNIA 12/07/2006  . GASTROINTESTINAL HEMORRHAGE 10/13/2009  . ANGIOEDEMA 03/07/2008  . ABDOMINAL WALL CONTUSION 02/18/2010  . Wheezing 04/01/2010  . DIVERTICULOSIS-COLON 04/06/2010  . DIVERTICULOSIS, COLON, WITH HEMORRHAGE 04/06/2010  . RECTAL BLEEDING 04/06/2010  . CAD (coronary artery disease)     Minimal plaque at cardiac catheterization 1/12: CFX 20%, EF 70%  . LBBB (left bundle branch block) 06/17/2010  . CONGESTIVE HEART FAILURE 04/06/2010  . Impaired glucose tolerance 06/25/2010  . DM (diabetes mellitus) in pregnancy, delivered w/postpartum condition 08/30/2010  . Type II or unspecified type diabetes mellitus without  mention of complication, uncontrolled 08/30/2010   Past Surgical History  Procedure Date  . Abdominal hysterectomy 1987  . Ventral hernia repair 06/2006  . Myomectomy     Septal    reports that she has quit smoking. She does not have any smokeless tobacco history on file. She reports that she drinks alcohol. She reports that she does not use illicit drugs. family history includes Cancer in her daughter. Allergies  Allergen Reactions  . Ace Inhibitors     REACTION: angioedema right eye  . Aspirin   . Codeine   . Soy Allergy    Current Outpatient Prescriptions on File Prior to Visit  Medication Sig Dispense Refill  . acetaminophen (TYLENOL 8 HOUR) 650 MG CR tablet 1 by mouth every 4 hours as needed       . atorvastatin (LIPITOR) 10 MG tablet Take 1 tablet (10 mg total) by mouth daily.  90 tablet  3  . cetirizine (ZYRTEC) 10 MG tablet Take 10 mg by mouth daily.        . furosemide (LASIX) 20 MG tablet Take 1 tablet (20 mg total) by mouth daily.  90 tablet  3  . metFORMIN (GLUCOPHAGE) 500 MG tablet Take 1 tablet (500 mg total) by mouth daily with breakfast.  90 tablet  3  . metoprolol (TOPROL-XL) 100 MG 24 hr tablet Take 1 tablet (100 mg total) by mouth daily.  30 tablet  11  . potassium chloride SA (K-DUR,KLOR-CON) 20 MEQ tablet Take 1 tablet (20 mEq total) by mouth daily.  30 tablet  11  .  sennosides-docusate sodium (SENOKOT-S) 8.6-50 MG tablet Take 2 tablets by mouth as needed.        . topiramate (TOPAMAX) 200 MG tablet Take 200 mg by mouth 2 (two) times daily.         Review of Systems Review of Systems  Constitutional: Negative for diaphoresis and unexpected weight change.  HENT: Negative for drooling and tinnitus.   Eyes: Negative for photophobia and visual disturbance.  Respiratory: Negative for choking and stridor.   Gastrointestinal: Negative for vomiting and blood in stool.  Genitourinary: Negative for hematuria and decreased urine volume.    Objective:   Physical Exam BP  120/82  Pulse 78  Temp(Src) 99.6 F (37.6 C) (Oral)  Ht '5\' 9"'$  (1.753 m)  Wt 167 lb 4 oz (75.864 kg)  BMI 24.70 kg/m2  SpO2 94% Physical Exam  VS noted, mild ill Constitutional: Pt appears well-developed and well-nourished.  HENT: Head: Normocephalic.  Right Ear: External ear normal.  Left Ear: External ear normal.  Bilat tm's mild erythema.  Sinus nontender.  Pharynx mild erythema Eyes: Conjunctivae and EOM are normal. Pupils are equal, round, and reactive to light.  Neck: Normal range of motion. Neck supple.  Cardiovascular: Normal rate and regular rhythm.   Pulmonary/Chest: Effort normal and breath sounds mild decreased with mild wheeze bilat Skin: Skin is warm. No erythema.  Psychiatric: Pt behavior is normal. Thought content normal.     Assessment & Plan:

## 2011-06-13 ENCOUNTER — Telehealth: Payer: Self-pay

## 2011-06-13 DIAGNOSIS — R928 Other abnormal and inconclusive findings on diagnostic imaging of breast: Secondary | ICD-10-CM

## 2011-06-13 NOTE — Telephone Encounter (Signed)
Yes order placed

## 2011-06-13 NOTE — Telephone Encounter (Signed)
Pt called back stating that she has a free mammogram by [facility] in partnership with [facility]. Pt says she received a letter stating that mammogram was abnormal and further testing was advised to R/O breast cancer. Letter did not specify which breast. Pt is requesting advisement on follow up testing, can a diagnostic mammogram or [place] be ordered for this Dr Jonny Ruiz pt?

## 2011-06-13 NOTE — Telephone Encounter (Signed)
Pt called stating hat she recently has her yearly mammogram and the results were abnormal. Pt says that results will be sent to PCP but and once reviewed she is requesting advisement on how to follow up. I called pt back but VM is full and cannot accept any new messages.

## 2011-06-14 NOTE — Telephone Encounter (Signed)
Pt advised and will expect a call with appt info.

## 2011-06-14 NOTE — Telephone Encounter (Signed)
No answer, unable to leave VM (mailbox is full).

## 2011-06-24 ENCOUNTER — Telehealth: Payer: Self-pay

## 2011-06-24 DIAGNOSIS — R928 Other abnormal and inconclusive findings on diagnostic imaging of breast: Secondary | ICD-10-CM

## 2011-06-24 LAB — HM MAMMOGRAPHY

## 2011-06-24 NOTE — Telephone Encounter (Signed)
The patient had a mammogram with [facility].  They  Found a small spot on her right breast.  She stated the results should be sent to her PCP early next week and she would like a diagnostic mammogram scheduled once Dr. Jonny Ruiz has results. Call back number [phone]

## 2011-06-24 NOTE — Telephone Encounter (Signed)
Ok to order now based on pt hx ; would still like the results for the chart though  I did order, robin to check with [facility] to make sure they received it

## 2011-06-24 NOTE — Telephone Encounter (Signed)
[name] to ask Stacey [name] for instructions and help on how to order this please; I cant figure it out

## 2011-06-24 NOTE — Telephone Encounter (Signed)
Patient informed of order. Checked with [facility]'s order is not in correctly

## 2011-06-26 ENCOUNTER — Other Ambulatory Visit: Payer: Self-pay | Admitting: Internal Medicine

## 2011-06-27 NOTE — Telephone Encounter (Signed)
Stacey [name] is out of the office this week for vacation (06/27/11 through 07/01/2011)

## 2011-07-06 NOTE — Telephone Encounter (Signed)
Faxed signed release form to [phone] Attn. [place]. At [facility].

## 2011-07-06 NOTE — Telephone Encounter (Signed)
Stacey [name] has been informed.

## 2011-07-06 NOTE — Telephone Encounter (Signed)
[name] to ask jilda on [address] to order diag mammogram in epic

## 2011-07-06 NOTE — Telephone Encounter (Signed)
The issue is that the diagnostic mammography cannot be scheduled until the [facility] has the report and films from the previous screening mammogram. [facility] and the patient must sign a record release. Information given to [facility] and she will contact the patient ,have the record release signed and will fax to [facility] radiology.

## 2011-07-13 NOTE — Telephone Encounter (Signed)
Called [facility] in [place] at [phone] to inquire on results as have not yet received. They did fax over a letter of the results today 07/13/11.  Put results of MD's desk to review.

## 2011-07-18 ENCOUNTER — Telehealth: Payer: Self-pay

## 2011-07-18 NOTE — Telephone Encounter (Signed)
Patient called stating [facility] does not have mammogram results yet from [facility] and cannot do scheduled diagnostic mammogram 07/20/11 without results (Letter received on 07/13/11 is not enough information).  Called [facility] at [phone] and spoke to Hunter who informed all information was mailed to [place] [address] today 07/18/11.  Please advise as patient is extremely frustrated about the delay of this test being scheduled.

## 2011-07-18 NOTE — Telephone Encounter (Signed)
All we can do is inform the pt when the records arrive here, give to pt to hand carry to [facility] and ask to re-schedule her test asap

## 2011-07-19 NOTE — Telephone Encounter (Signed)
Called the patient informed of MD's instructions and will call as soon as results received.

## 2011-07-21 NOTE — Telephone Encounter (Signed)
Received by mail today (07/21/11) the patients test results and CD's from [facility].  Called the patient to pickup and take to [facility] per MD instructions.  Called imaging and informed patient bringing results needed for test scheduled tomorrow 07/22/11. Patient picked up results and CD's as instructed.

## 2011-07-22 ENCOUNTER — Ambulatory Visit
Admission: RE | Admit: 2011-07-22 | Discharge: 2011-07-22 | Disposition: A | Discharge status: Home / Self Care | Payer: BC Managed Care – PPO | Source: Ambulatory Visit | Attending: Internal Medicine | Admitting: Internal Medicine

## 2011-07-22 DIAGNOSIS — R928 Other abnormal and inconclusive findings on diagnostic imaging of breast: Secondary | ICD-10-CM

## 2011-07-27 ENCOUNTER — Encounter: Payer: Self-pay | Admitting: Internal Medicine

## 2011-07-27 ENCOUNTER — Ambulatory Visit (INDEPENDENT_AMBULATORY_CARE_PROVIDER_SITE_OTHER): Payer: BC Managed Care – PPO | Admitting: Internal Medicine

## 2011-07-27 VITALS — BP 138/80 | HR 66 | Temp 97.8°F | Ht 69.0 in | Wt 168.1 lb

## 2011-07-27 DIAGNOSIS — IMO0001 Reserved for inherently not codable concepts without codable children: Secondary | ICD-10-CM

## 2011-07-27 DIAGNOSIS — H6692 Otitis media, unspecified, left ear: Secondary | ICD-10-CM

## 2011-07-27 DIAGNOSIS — I10 Essential (primary) hypertension: Secondary | ICD-10-CM

## 2011-07-27 DIAGNOSIS — R42 Dizziness and giddiness: Secondary | ICD-10-CM

## 2011-07-27 DIAGNOSIS — H6690 Otitis media, unspecified, unspecified ear: Secondary | ICD-10-CM

## 2011-07-27 MED ORDER — PROMETHAZINE HCL 25 MG PO TABS: 25.0000 mg | ORAL_TABLET | Freq: Four times a day (QID) | ORAL | Status: DC | PRN

## 2011-07-27 MED ORDER — MECLIZINE HCL 12.5 MG PO TABS
12.5000 mg | ORAL_TABLET | Freq: Three times a day (TID) | ORAL | Status: AC | PRN
Start: 2011-07-27 — End: 2011-08-06

## 2011-07-27 MED ORDER — LEVOFLOXACIN 250 MG PO TABS: 250.0000 mg | ORAL_TABLET | Freq: Every day | ORAL | Status: AC

## 2011-07-27 NOTE — Assessment & Plan Note (Signed)
With nausesa, likely related to left ear involvement - for meclizine and phenergan prn,  to f/u any worsening symptoms or concerns

## 2011-07-27 NOTE — Patient Instructions (Addendum)
Take all new medications as prescribed Continue all other medications as before  

## 2011-07-28 ENCOUNTER — Encounter: Payer: Self-pay | Admitting: Internal Medicine

## 2011-07-28 NOTE — Assessment & Plan Note (Signed)
stable overall by hx and exam, most recent data reviewed with pt, and pt to continue medical treatment as before Lab Results  Component Value Date   HGBA1C 6.9* 03/04/2011

## 2011-07-28 NOTE — Assessment & Plan Note (Signed)
stable overall by hx and exam, most recent data reviewed with pt, and pt to continue medical treatment as before BP Readings from Last 3 Encounters:  07/27/11 138/80  04/13/11 120/82  03/04/11 120/80

## 2011-07-28 NOTE — Progress Notes (Signed)
Subjective:    Patient ID: Stacey [name], female    DOB: 1950-10-26, 61 y.o.   MRN: [id number]  HPI   Here with 3 days acute onset fever, mod to severe left ear pressure, general weakness and malaise, and greenish nasal d/c, with slight ST, but little to no cough and Pt denies chest pain, increased sob or doe, wheezing, orthopnea, PND, increased LE swelling, palpitations, or syncope but has had marked vertigo as well since yesterday.   Pt denies fever, wt loss, night sweats, loss of appetite, or other constitutional symptoms except for the above.  Pt denies new neurological symptoms such as new headache, or facial or extremity weakness or numbness   Pt denies polydipsia, polyuria, or low sugar symptoms such as weakness or confusion improved with po intake.  Pt states overall good compliance with meds, trying to follow lower cholesterol, diabetic diet, wt overall stable but little exercise however.    Past Medical History  Diagnosis Date  . HYPERLIPIDEMIA 08/01/2006  . OBESITY 09/16/2006  . SICKLE CELL ANEMIA 08/01/2006  . ANXIETY 09/16/2006  . DEPRESSION 09/16/2006  . HYPERTENSION 08/01/2006  . 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 s/p septal myomectomy 4/12 at [facility] with Dr. Silvestre Mesi;  b. echo 5/12: EF 60-65%, LVOT peak 18 mmHg; grade 1 diast dysfxn, mild SAM (improved since myomectomy)  . BRADYCARDIA 10/13/2009  . Diastolic heart failure 09/16/2006  . ALLERGIC RHINITIS 12/07/2006  . ASTHMA 08/01/2006  . VENTRAL HERNIA 12/07/2006  . GASTROINTESTINAL HEMORRHAGE 10/13/2009  . ANGIOEDEMA 03/07/2008  . ABDOMINAL WALL CONTUSION 02/18/2010  . Wheezing 04/01/2010  . DIVERTICULOSIS-COLON 04/06/2010  . DIVERTICULOSIS, COLON, WITH HEMORRHAGE 04/06/2010  . RECTAL BLEEDING 04/06/2010  . CAD (coronary artery disease)     Minimal plaque at cardiac catheterization 1/12: CFX 20%, EF 70%  . LBBB (left bundle branch block) 06/17/2010  . CONGESTIVE HEART FAILURE 04/06/2010  . Impaired glucose  tolerance 06/25/2010  . DM (diabetes mellitus) in pregnancy, delivered w/postpartum condition 08/30/2010  . Type II or unspecified type diabetes mellitus without mention of complication, uncontrolled 08/30/2010   Past Surgical History  Procedure Date  . Abdominal hysterectomy 1987  . Ventral hernia repair 06/2006  . Myomectomy     Septal    reports that she has quit smoking. She does not have any smokeless tobacco history on file. She reports that she drinks alcohol. She reports that she does not use illicit drugs. family history includes Cancer in her daughter. Allergies  Allergen Reactions  . Ace Inhibitors     REACTION: angioedema right eye  . Aspirin   . Codeine   . Soy Allergy    Current Outpatient Prescriptions on File Prior to Visit  Medication Sig Dispense Refill  . acetaminophen (TYLENOL 8 HOUR) 650 MG CR tablet 1 by mouth every 4 hours as needed       . albuterol (PROVENTIL HFA;VENTOLIN HFA) 108 (90 BASE) MCG/ACT inhaler Inhale 2 puffs into the lungs every 4 (four) hours as needed for wheezing.  1 Inhaler  1  . atorvastatin (LIPITOR) 10 MG tablet Take 1 tablet (10 mg total) by mouth daily.  90 tablet  3  . cetirizine (ZYRTEC) 10 MG tablet Take 10 mg by mouth daily.        . chlorpheniramine-HYDROcodone (TUSSIONEX PENNKINETIC ER) 10-8 MG/5ML LQCR Take 5 mLs by mouth every 12 (twelve) hours as needed.  140 mL  1  . furosemide (LASIX) 20 MG tablet TAKE ONE TABLET BY MOUTH  EVERY DAY  90 tablet  3  . KLOR-CON M20 20 MEQ tablet TAKE ONE TABLET BY MOUTH EVERY DAY  30 each  11  . metFORMIN (GLUCOPHAGE) 500 MG tablet Take 1 tablet (500 mg total) by mouth daily with breakfast.  90 tablet  3  . metoprolol (TOPROL-XL) 100 MG 24 hr tablet Take 1 tablet (100 mg total) by mouth daily.  30 tablet  11  . sennosides-docusate sodium (SENOKOT-S) 8.6-50 MG tablet Take 2 tablets by mouth as needed.        . topiramate (TOPAMAX) 200 MG tablet Take 200 mg by mouth 2 (two) times daily.        .  predniSONE (DELTASONE) 10 MG tablet 3 tabs by mouth per day for 3 days,2tabs per day for 3 days,1tab per day for 3 days  18 tablet  0  . promethazine (PHENERGAN) 25 MG tablet Take 1 tablet (25 mg total) by mouth every 6 (six) hours as needed for nausea.  40 tablet  1  . DISCONTD: gabapentin (NEURONTIN) 300 MG capsule Take 300 mg by mouth 3 (three) times daily.         Review of Systems Review of Systems  Constitutional: Negative for diaphoresis and unexpected weight change.  HENT: Negative for drooling and tinnitus.   Eyes: Negative for photophobia and visual disturbance.  Respiratory: Negative for choking and stridor.   Gastrointestinal: Negative for vomiting and blood in stool.  Genitourinary: Negative for hematuria and decreased urine volume.  Musculoskeletal: Negative for gait problem.  Psychiatric/Behavioral: Negative for decreased concentration. The patient is not hyperactive.      Objective:   Physical Exam BP 138/80  Pulse 66  Temp 97.8 F (36.6 C) (Oral)  Ht 5\' 9"  (1.753 m)  Wt 168 lb 2 oz (76.261 kg)  BMI 24.83 kg/m2  SpO2 93% Physical Exam  VS noted, mild ill Constitutional: Pt appears well-developed and well-nourished.  HENT: Head: Normocephalic.  Right Ear: External ear normal.  Left Ear: External ear normal.  Left TM severe erythema, mild bulging Eyes: Conjunctivae and EOM are normal. Pupils are equal, round, and reactive to light.  Neck: Normal range of motion. Neck supple.  Cardiovascular: Normal rate and regular rhythm.   Pulmonary/Chest: Effort normal and breath sounds normal.  Neurological: Pt is alert. No cranial nerve deficit. motor/sens/dtr intact, gait intact Skin: Skin is warm. No erythema.  Psychiatric: Pt behavior is normal. Thought content normal. 1+ nervous    Assessment & Plan:

## 2011-07-28 NOTE — Assessment & Plan Note (Signed)
Mild to mod, for antibx course,  to f/u any worsening symptoms or concerns 

## 2011-08-15 ENCOUNTER — Ambulatory Visit (INDEPENDENT_AMBULATORY_CARE_PROVIDER_SITE_OTHER): Payer: BC Managed Care – PPO | Admitting: Internal Medicine

## 2011-08-15 ENCOUNTER — Encounter: Payer: Self-pay | Admitting: Internal Medicine

## 2011-08-15 VITALS — BP 142/110 | HR 75 | Temp 97.0°F | Wt 168.5 lb

## 2011-08-15 DIAGNOSIS — IMO0001 Reserved for inherently not codable concepts without codable children: Secondary | ICD-10-CM

## 2011-08-15 DIAGNOSIS — G500 Trigeminal neuralgia: Secondary | ICD-10-CM

## 2011-08-15 DIAGNOSIS — I10 Essential (primary) hypertension: Secondary | ICD-10-CM

## 2011-08-15 MED ORDER — GABAPENTIN 300 MG PO CAPS: 300.0000 mg | ORAL_CAPSULE | Freq: Three times a day (TID) | ORAL | Status: DC

## 2011-08-15 MED ORDER — KETOROLAC TROMETHAMINE 30 MG/ML IJ SOLN
30.0000 mg | Freq: Once | INTRAMUSCULAR | Status: AC
  Administered 2011-08-15: 30 mg via INTRAMUSCULAR

## 2011-08-15 NOTE — Patient Instructions (Addendum)
You had the toradol pain shot today Ok to re-start the gabapentin at 300 mg three times per day Remember to watch for sleepiness with this Please call for increase dose to 600 mg three times per day (or four times per day) if you need this Please keep your appointments with your specialists as you have planned

## 2011-08-15 NOTE — Assessment & Plan Note (Signed)
.  stable overall by hx and exam, most recent data reviewed with pt, and pt to continue medical treatment as before Lab Results  Component Value Date   HGBA1C 6.9* 03/04/2011

## 2011-08-15 NOTE — Assessment & Plan Note (Addendum)
With severe pain and right eye lacrimation similar to prior, doubt infectious etiology, ok to follow for fever or other s/s; to re-start the gabapentin asd, f/u with [facility] , also gave toradol IM today

## 2011-08-15 NOTE — Assessment & Plan Note (Addendum)
Mild elev today likely reactive, stable overall by hx and exam, most recent data reviewed with pt, and pt to continue medical treatment as before BP Readings from Last 3 Encounters:  08/15/11 142/110  07/27/11 138/80  04/13/11 120/82

## 2011-08-15 NOTE — Progress Notes (Signed)
Subjective:    Patient ID: Stacey [name], female    DOB: [birth year], 61 y.o.   MRN: [id number]  HPI  Here with c/o 3 days onset right facial pain, severe, lancinating type from right forehead to max sinus area with marked tenderness to touch at the maxillary area, assoc with marked right eye lacrimation, just wont quit.  For some reason is hesitant to go to [facility] though her initial dx and tx was there, and has gabapentin left over from prior tx with flare of pain jan 2013;  Is seeing [facility] primarily for migraine s/p botox with topamax prevention;  Pain now constant, severe, worse to palpate as above, nothing so far makes better, though gabapentin prior did help, did not have side effect but some reason reluctant to re-start.  Wanted to see if she had sinus infection instead but no fever or signficant congestion besides usual nasall allergy symptoms and post nasal gtt.  Pt denies chest pain, increased sob or doe, wheezing, orthopnea, PND, increased LE swelling, palpitations, dizziness or syncope.  Pt denies polydipsia, polyuria, Past Medical History  Diagnosis Date  . HYPERLIPIDEMIA 08/01/2006  . OBESITY 09/16/2006  . SICKLE CELL ANEMIA 08/01/2006  . ANXIETY 09/16/2006  . DEPRESSION 09/16/2006  . HYPERTENSION 08/01/2006  . 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 s/p septal myomectomy 4/12 at [facility] with Dr. Silvestre Mesi;  b. echo 5/12: EF 60-65%, LVOT peak 18 mmHg; grade 1 diast dysfxn, mild SAM (improved since myomectomy)  . BRADYCARDIA 10/13/2009  . Diastolic heart failure 09/16/2006  . ALLERGIC RHINITIS 12/07/2006  . ASTHMA 08/01/2006  . VENTRAL HERNIA 12/07/2006  . GASTROINTESTINAL HEMORRHAGE 10/13/2009  . ANGIOEDEMA 03/07/2008  . ABDOMINAL WALL CONTUSION 02/18/2010  . Wheezing 04/01/2010  . DIVERTICULOSIS-COLON 04/06/2010  . DIVERTICULOSIS, COLON, WITH HEMORRHAGE 04/06/2010  . RECTAL BLEEDING 04/06/2010  . CAD (coronary artery disease)     Minimal plaque at cardiac  catheterization 1/12: CFX 20%, EF 70%  . LBBB (left bundle branch block) 06/17/2010  . CONGESTIVE HEART FAILURE 04/06/2010  . Impaired glucose tolerance 06/25/2010  . DM (diabetes mellitus) in pregnancy, delivered w/postpartum condition 08/30/2010  . Type II or unspecified type diabetes mellitus without mention of complication, uncontrolled 08/30/2010   Past Surgical History  Procedure Date  . Abdominal hysterectomy 1987  . Ventral hernia repair 06/2006  . Myomectomy     Septal    reports that she has quit smoking. She does not have any smokeless tobacco history on file. She reports that she drinks alcohol. She reports that she does not use illicit drugs. family history includes Cancer in her daughter. Allergies  Allergen Reactions  . Ace Inhibitors     REACTION: angioedema right eye  . Aspirin   . Codeine   . Soy Allergy    Current Outpatient Prescriptions on File Prior to Visit  Medication Sig Dispense Refill  . acetaminophen (TYLENOL 8 HOUR) 650 MG CR tablet 1 by mouth every 4 hours as needed       . albuterol (PROVENTIL HFA;VENTOLIN HFA) 108 (90 BASE) MCG/ACT inhaler Inhale 2 puffs into the lungs every 4 (four) hours as needed for wheezing.  1 Inhaler  1  . atorvastatin (LIPITOR) 10 MG tablet Take 1 tablet (10 mg total) by mouth daily.  90 tablet  3  . cetirizine (ZYRTEC) 10 MG tablet Take 10 mg by mouth daily.        . chlorpheniramine-HYDROcodone (TUSSIONEX PENNKINETIC ER) 10-8 MG/5ML LQCR Take 5 mLs  by mouth every 12 (twelve) hours as needed.  140 mL  1  . KLOR-CON M20 20 MEQ tablet TAKE ONE TABLET BY MOUTH EVERY DAY  30 each  11  . metFORMIN (GLUCOPHAGE) 500 MG tablet Take 1 tablet (500 mg total) by mouth daily with breakfast.  90 tablet  3  . metoprolol (TOPROL-XL) 100 MG 24 hr tablet Take 1 tablet (100 mg total) by mouth daily.  30 tablet  11  . sennosides-docusate sodium (SENOKOT-S) 8.6-50 MG tablet Take 2 tablets by mouth as needed.        . topiramate (TOPAMAX) 200 MG tablet  Take 200 mg by mouth 2 (two) times daily.        . furosemide (LASIX) 20 MG tablet TAKE ONE TABLET BY MOUTH EVERY DAY  90 tablet  3  . gabapentin (NEURONTIN) 300 MG capsule Take 1 capsule (300 mg total) by mouth 3 (three) times daily.  90 capsule  5  . predniSONE (DELTASONE) 10 MG tablet 3 tabs by mouth per day for 3 days,2tabs per day for 3 days,1tab per day for 3 days  18 tablet  0  . promethazine (PHENERGAN) 25 MG tablet Take 1 tablet (25 mg total) by mouth every 6 (six) hours as needed for nausea.  40 tablet  1   No current facility-administered medications on file prior to visit.    Review of Systems Review of Systems  Constitutional: Negative for diaphoresis and unexpected weight change.  HENT: Negative for drooling and tinnitus.   Eyes: Negative for photophobia, but has some right blurred vision.  Respiratory: Negative for choking and stridor.   Gastrointestinal: Negative for vomiting and blood in stool.  Genitourinary: Negative for hematuria and decreased urine volume.  Musculoskeletal: Negative for gait problem.  Skin: Negative for color change and wound.  Neurological: Negative for tremors and numbness.     Objective:   Physical Exam BP 142/110  Pulse 75  Temp 97 F (36.1 C) (Oral)  Wt 168 lb 8 oz (76.431 kg)  SpO2 95% Physical Exam  VS noted, wincing with severe right facial neuritic pain it appears Constitutional: Pt appears well-developed and well-nourished.  HENT: Head: Normocephalic.  Right Ear: External ear normal.  Left Ear: External ear normal.  Vague nondiscrete erythema noted maxillary sinus area Eyes: Conjunctivae and EOM are normal, except for marked right conjucntival erythema, clear drainage. Pupils are equal, round, and reactive to light.  Neck: Normal range of motion. Neck supple.  Cardiovascular: Normal rate and regular rhythm.   Pulmonary/Chest: Effort normal and breath sounds normal.  Neurological: Pt is alert. No cranial nerve deficit.  motor/sens/dtr/gait intact Skin: Skin is warm. No erythema.  Psychiatric: Pt behavior is normal. Thought content normal. 1+ nervous    Assessment & Plan:

## 2011-08-25 ENCOUNTER — Other Ambulatory Visit (INDEPENDENT_AMBULATORY_CARE_PROVIDER_SITE_OTHER): Payer: BC Managed Care – PPO

## 2011-08-25 DIAGNOSIS — Z0000 Encounter for general adult medical examination without abnormal findings: Secondary | ICD-10-CM

## 2011-08-25 DIAGNOSIS — IMO0001 Reserved for inherently not codable concepts without codable children: Secondary | ICD-10-CM

## 2011-08-25 LAB — BASIC METABOLIC PANEL
BUN: 18 mg/dL (ref 6–23)
CO2: 27 mEq/L (ref 19–32)
Calcium: 9.6 mg/dL (ref 8.4–10.5)
Chloride: 108 mEq/L (ref 96–112)
Creatinine, Ser: 0.6 mg/dL (ref 0.4–1.2)
GFR: 123.94 mL/min (ref >60.00)
Glucose, Bld: 154 mg/dL — ABNORMAL HIGH (ref 70–99)
Potassium: 4.1 mEq/L (ref 3.5–5.1)
Sodium: 142 mEq/L (ref 135–145)

## 2011-08-25 LAB — HEPATIC FUNCTION PANEL
ALT: 58 U/L — ABNORMAL HIGH (ref 0–35)
AST: 46 U/L — ABNORMAL HIGH (ref 0–37)
Albumin: 4.3 g/dL (ref 3.5–5.2)
Alkaline Phosphatase: 119 U/L — ABNORMAL HIGH (ref 39–117)
Bilirubin, Direct: 0 mg/dL (ref 0.0–0.3)
Total Bilirubin: 0.5 mg/dL (ref 0.3–1.2)
Total Protein: 7.7 g/dL (ref 6.0–8.3)

## 2011-08-25 LAB — CBC WITH DIFFERENTIAL/PLATELET
Basophils Absolute: 0 10*3/uL (ref 0.0–0.1)
Basophils Relative: 0.6 % (ref 0.0–3.0)
Eosinophils Absolute: 0.1 10*3/uL (ref 0.0–0.7)
Eosinophils Relative: 1.9 % (ref 0.0–5.0)
HCT: 41.4 % (ref 36.0–46.0)
Hemoglobin: 13.5 g/dL (ref 12.0–15.0)
Lymphocytes Relative: 28.8 % (ref 12.0–46.0)
Lymphs Abs: 1.9 10*3/uL (ref 0.7–4.0)
MCHC: 32.6 g/dL (ref 30.0–36.0)
MCV: 78.7 fl (ref 78.0–100.0)
Monocytes Absolute: 0.4 10*3/uL (ref 0.1–1.0)
Monocytes Relative: 5.3 % (ref 3.0–12.0)
Neutro Abs: 4.2 10*3/uL (ref 1.4–7.7)
Neutrophils Relative %: 63.4 % (ref 43.0–77.0)
Platelets: 209 10*3/uL (ref 150.0–400.0)
RBC: 5.26 Mil/uL — ABNORMAL HIGH (ref 3.87–5.11)
RDW: 15.5 % — ABNORMAL HIGH (ref 11.5–14.6)
WBC: 6.7 10*3/uL (ref 4.5–10.5)

## 2011-08-25 LAB — URINALYSIS, ROUTINE W REFLEX MICROSCOPIC
Bilirubin Urine: NEGATIVE
Hgb urine dipstick: NEGATIVE
Ketones, ur: NEGATIVE
Leukocytes, UA: NEGATIVE
Nitrite: NEGATIVE
Specific Gravity, Urine: 1.02 (ref 1.000–1.030)
Total Protein, Urine: NEGATIVE
Urine Glucose: NEGATIVE
Urobilinogen, UA: 0.2 (ref 0.0–1.0)
pH: 5.5 (ref 5.0–8.0)

## 2011-08-25 LAB — MICROALBUMIN / CREATININE URINE RATIO
Creatinine,U: 131.9 mg/dL
Microalb Creat Ratio: 1.6 mg/g (ref 0.0–30.0)
Microalb, Ur: 2.1 mg/dL — ABNORMAL HIGH (ref 0.0–1.9)

## 2011-08-25 LAB — LIPID PANEL
Cholesterol: 223 mg/dL — ABNORMAL HIGH (ref 0–200)
HDL: 52 mg/dL (ref >39.00)
Total CHOL/HDL Ratio: 4
Triglycerides: 348 mg/dL — ABNORMAL HIGH (ref 0.0–149.0)
VLDL: 69.6 mg/dL — ABNORMAL HIGH (ref 0.0–40.0)

## 2011-08-25 LAB — LDL CHOLESTEROL, DIRECT: Direct LDL: 105 mg/dL

## 2011-08-25 LAB — TSH: TSH: 0.56 u[IU]/mL (ref 0.35–5.50)

## 2011-08-25 LAB — HEMOGLOBIN A1C: Hgb A1c MFr Bld: 6.8 % — ABNORMAL HIGH (ref 4.6–6.5)

## 2011-08-30 ENCOUNTER — Other Ambulatory Visit: Payer: Self-pay | Admitting: Internal Medicine

## 2011-09-01 ENCOUNTER — Ambulatory Visit: Payer: BC Managed Care – PPO | Admitting: Internal Medicine

## 2011-09-07 ENCOUNTER — Encounter: Payer: Self-pay | Admitting: Internal Medicine

## 2011-09-07 ENCOUNTER — Other Ambulatory Visit (INDEPENDENT_AMBULATORY_CARE_PROVIDER_SITE_OTHER): Payer: BC Managed Care – PPO

## 2011-09-07 ENCOUNTER — Ambulatory Visit (INDEPENDENT_AMBULATORY_CARE_PROVIDER_SITE_OTHER): Payer: BC Managed Care – PPO | Admitting: Internal Medicine

## 2011-09-07 VITALS — BP 130/80 | HR 82 | Temp 98.4°F | Ht 69.0 in | Wt 169.5 lb

## 2011-09-07 DIAGNOSIS — IMO0001 Reserved for inherently not codable concepts without codable children: Secondary | ICD-10-CM

## 2011-09-07 DIAGNOSIS — R7989 Other specified abnormal findings of blood chemistry: Secondary | ICD-10-CM

## 2011-09-07 DIAGNOSIS — Z0000 Encounter for general adult medical examination without abnormal findings: Secondary | ICD-10-CM

## 2011-09-07 DIAGNOSIS — R945 Abnormal results of liver function studies: Secondary | ICD-10-CM

## 2011-09-07 DIAGNOSIS — I722 Aneurysm of renal artery: Secondary | ICD-10-CM

## 2011-09-07 LAB — HEPATIC FUNCTION PANEL
ALT: 22 U/L (ref 0–35)
AST: 17 U/L (ref 0–37)
Albumin: 4.3 g/dL (ref 3.5–5.2)
Alkaline Phosphatase: 112 U/L (ref 39–117)
Bilirubin, Direct: 0 mg/dL (ref 0.0–0.3)
Total Bilirubin: 0.1 mg/dL — ABNORMAL LOW (ref 0.3–1.2)
Total Protein: 7.6 g/dL (ref 6.0–8.3)

## 2011-09-07 NOTE — Assessment & Plan Note (Signed)
Ok for [employer]

## 2011-09-07 NOTE — Patient Instructions (Addendum)
Continue all other medications as before You will be contacted regarding the referral for: abdomen ultrasound, and Vascular surgury Please go to LAB in the Basement for the blood and/or urine tests to be done today - the hepatitis tests You will be contacted by phone if any changes need to be made immediately.  Otherwise, you will receive a letter about your results with an explanation. Please have the pharmacy call with any refills you may need. Please continue your efforts at being more active, low cholesterol diet, and weight control. Please remember to followup with your GYN for the yearly pap smear and/or mammogram You are otherwise up to date with prevention

## 2011-09-07 NOTE — Assessment & Plan Note (Signed)
stable overall by hx and exam, most recent data reviewed with pt, and pt to continue medical treatment as before Lab Results  Component Value Date   HGBA1C 6.8* 08/25/2011

## 2011-09-07 NOTE — Assessment & Plan Note (Signed)

## 2011-09-07 NOTE — Progress Notes (Signed)
Subjective:    Patient ID: Stacey [name], female    DOB: [birth year], 61 y.o.   MRN: [id number]  HPI  Here for wellness and f/u;  Overall doing ok;  Pt denies CP, worsening SOB, DOE, wheezing, orthopnea, PND, worsening LE edema, palpitations, dizziness or syncope.  Pt denies neurological change such as new Headache, facial or extremity weakness.  Pt denies polydipsia, polyuria, or low sugar symptoms. Pt states overall good compliance with treatment and medications, good tolerability, and trying to follow lower cholesterol diet.  Pt denies worsening depressive symptoms, suicidal ideation or panic. No fever, wt loss, night sweats, loss of appetite, or other constitutional symptoms.  Pt states good ability with ADL's, low fall risk, home safety reviewed and adequate, no significant changes in hearing or vision, and occasionally active with exercise.  Out of lipitor for a couple of weeks before recent labs, but plans to re-start. Admits to some dietary noncompliacne and recnet increased use of tylenol for trigeminal neur pain , which has improved and no longer taking the tylenol, but unfort now with GI upset resovled with otc pepcid.  Has slight elev LFT's this visit, hx of fatty liver, but no signficant wt change. Past Medical History  Diagnosis Date  . HYPERLIPIDEMIA 08/01/2006  . OBESITY 09/16/2006  . SICKLE CELL ANEMIA 08/01/2006  . ANXIETY 09/16/2006  . DEPRESSION 09/16/2006  . HYPERTENSION 08/01/2006  . 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 s/p septal myomectomy 4/12 at [facility] with Dr. Silvestre Mesi;  b. echo 5/12: EF 60-65%, LVOT peak 18 mmHg; grade 1 diast dysfxn, mild SAM (improved since myomectomy)  . BRADYCARDIA 10/13/2009  . Diastolic heart failure 09/16/2006  . ALLERGIC RHINITIS 12/07/2006  . ASTHMA 08/01/2006  . VENTRAL HERNIA 12/07/2006  . GASTROINTESTINAL HEMORRHAGE 10/13/2009  . ANGIOEDEMA 03/07/2008  . ABDOMINAL WALL CONTUSION 02/18/2010  . Wheezing 04/01/2010  .  DIVERTICULOSIS-COLON 04/06/2010  . DIVERTICULOSIS, COLON, WITH HEMORRHAGE 04/06/2010  . RECTAL BLEEDING 04/06/2010  . CAD (coronary artery disease)     Minimal plaque at cardiac catheterization 1/12: CFX 20%, EF 70%  . LBBB (left bundle branch block) 06/17/2010  . CONGESTIVE HEART FAILURE 04/06/2010  . Impaired glucose tolerance 06/25/2010  . DM (diabetes mellitus) in pregnancy, delivered w/postpartum condition 08/30/2010  . Type II or unspecified type diabetes mellitus without mention of complication, uncontrolled 08/30/2010   Past Surgical History  Procedure Date  . Abdominal hysterectomy 1987  . Ventral hernia repair 06/2006  . Myomectomy     Septal    reports that she has quit smoking. She does not have any smokeless tobacco history on file. She reports that she drinks alcohol. She reports that she does not use illicit drugs. family history includes Cancer in her daughter. Allergies  Allergen Reactions  . Ace Inhibitors     REACTION: angioedema right eye  . Aspirin   . Codeine   . Soy Allergy    Current Outpatient Prescriptions on File Prior to Visit  Medication Sig Dispense Refill  . acetaminophen (TYLENOL 8 HOUR) 650 MG CR tablet 1 by mouth every 4 hours as needed       . albuterol (PROVENTIL HFA;VENTOLIN HFA) 108 (90 BASE) MCG/ACT inhaler Inhale 2 puffs into the lungs every 4 (four) hours as needed for wheezing.  1 Inhaler  1  . atorvastatin (LIPITOR) 10 MG tablet TAKE ONE TABLET BY MOUTH EVERY DAY  90 tablet  3  . cetirizine (ZYRTEC) 10 MG tablet Take 10 mg by mouth daily.        Marland Kitchen  chlorpheniramine-HYDROcodone (TUSSIONEX PENNKINETIC ER) 10-8 MG/5ML LQCR Take 5 mLs by mouth every 12 (twelve) hours as needed.  140 mL  1  . furosemide (LASIX) 20 MG tablet TAKE ONE TABLET BY MOUTH EVERY DAY  90 tablet  3  . gabapentin (NEURONTIN) 300 MG capsule Take 1 capsule (300 mg total) by mouth 3 (three) times daily.  90 capsule  5  . KLOR-CON M20 20 MEQ tablet TAKE ONE TABLET BY MOUTH EVERY DAY  30  each  11  . metoprolol (TOPROL-XL) 100 MG 24 hr tablet Take 1 tablet (100 mg total) by mouth daily.  30 tablet  11  . predniSONE (DELTASONE) 10 MG tablet 3 tabs by mouth per day for 3 days,2tabs per day for 3 days,1tab per day for 3 days  18 tablet  0  . sennosides-docusate sodium (SENOKOT-S) 8.6-50 MG tablet Take 2 tablets by mouth as needed.        . topiramate (TOPAMAX) 200 MG tablet Take 200 mg by mouth 2 (two) times daily.        . metFORMIN (GLUCOPHAGE) 500 MG tablet Take 1 tablet (500 mg total) by mouth daily with breakfast.  90 tablet  3  . promethazine (PHENERGAN) 25 MG tablet Take 1 tablet (25 mg total) by mouth every 6 (six) hours as needed for nausea.  40 tablet  1   Review of Systems Review of Systems  Constitutional: Negative for diaphoresis, activity change, appetite change and unexpected weight change.  HENT: Negative for hearing loss, ear pain, facial swelling, mouth sores and neck stiffness.   Eyes: Negative for pain, redness and visual disturbance.  Respiratory: Negative for shortness of breath and wheezing.   Cardiovascular: Negative for chest pain and palpitations.  Gastrointestinal: Negative for diarrhea, blood in stool, abdominal distention and rectal pain.  Genitourinary: Negative for hematuria, flank pain and decreased urine volume.  Musculoskeletal: Negative for myalgias and joint swelling.  Skin: Negative for color change and wound.  Neurological: Negative for syncope and numbness.  Hematological: Negative for adenopathy.  Psychiatric/Behavioral: Negative for hallucinations, self-injury, decreased concentration and agitation.      Objective:   Physical Exam BP 130/80  Pulse 82  Temp 98.4 F (36.9 C) (Oral)  Ht 5\' 9"  (1.753 m)  Wt 169 lb 8 oz (76.885 kg)  BMI 25.03 kg/m2  SpO2 94% Physical Exam  VS noted Constitutional: Pt is oriented to person, place, and time. Appears well-developed and well-nourished.  HENT:  Head: Normocephalic and atraumatic.    Right Ear: External ear normal.  Left Ear: External ear normal.  Nose: Nose normal.  Mouth/Throat: Oropharynx is clear and moist.  Eyes: Conjunctivae and EOM are normal. Pupils are equal, round, and reactive to light.  Neck: Normal range of motion. Neck supple. No JVD present. No tracheal deviation present.  Cardiovascular: Normal rate, regular rhythm, normal heart sounds and intact distal pulses.  with grade 3-4/6 systolic murmur Pulmonary/Chest: Effort normal and breath sounds normal.  Abdominal: Soft. Bowel sounds are normal. There is no tenderness.  Musculoskeletal: Normal range of motion. Exhibits no edema.  Lymphadenopathy:  Has no cervical adenopathy.  Neurological: Pt is alert and oriented to person, place, and time. Pt has normal reflexes. No cranial nerve deficit.  Skin: Skin is warm and dry. No rash noted.  Psychiatric:  Has  normal mood and affect. Behavior is normal.     Assessment & Plan:

## 2011-09-07 NOTE — Assessment & Plan Note (Signed)
?   Fatty liver vs other - for abd u/s and hep profile, consider GI referral

## 2011-09-08 ENCOUNTER — Telehealth: Payer: Self-pay

## 2011-09-08 ENCOUNTER — Encounter: Payer: Self-pay | Admitting: Internal Medicine

## 2011-09-08 LAB — HEPATITIS PANEL, ACUTE
HCV Ab: NEGATIVE
Hep A IgM: NEGATIVE
Hep B C IgM: NEGATIVE
Hepatitis B Surface Ag: NEGATIVE

## 2011-09-08 NOTE — Telephone Encounter (Signed)
Informed [facility] of MD information. Stated they would go ahead and schedule her test.

## 2011-09-08 NOTE — Telephone Encounter (Signed)
[facility] received order for renal artery anurism.  They see the notes on abd U/S (not scheduled yet), but need any recent test in order to schedule renal please advise

## 2011-09-08 NOTE — Telephone Encounter (Signed)
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

## 2011-09-09 ENCOUNTER — Other Ambulatory Visit: Payer: Self-pay

## 2011-09-09 DIAGNOSIS — I722 Aneurysm of renal artery: Secondary | ICD-10-CM

## 2011-09-12 ENCOUNTER — Encounter: Payer: Self-pay | Admitting: Internal Medicine

## 2011-09-12 ENCOUNTER — Ambulatory Visit
Admission: RE | Admit: 2011-09-12 | Discharge: 2011-09-12 | Disposition: A | Discharge status: Home / Self Care | Payer: BC Managed Care – PPO | Source: Ambulatory Visit | Attending: Internal Medicine | Admitting: Internal Medicine

## 2011-09-12 DIAGNOSIS — R945 Abnormal results of liver function studies: Secondary | ICD-10-CM

## 2011-09-12 DIAGNOSIS — R7989 Other specified abnormal findings of blood chemistry: Secondary | ICD-10-CM

## 2011-09-28 ENCOUNTER — Other Ambulatory Visit: Payer: Self-pay | Admitting: Internal Medicine

## 2011-10-07 ENCOUNTER — Encounter: Payer: Self-pay | Admitting: Surgery

## 2011-10-10 ENCOUNTER — Encounter: Payer: Self-pay | Admitting: Surgery

## 2011-10-10 ENCOUNTER — Ambulatory Visit (INDEPENDENT_AMBULATORY_CARE_PROVIDER_SITE_OTHER): Payer: BC Managed Care – PPO | Admitting: Surgery

## 2011-10-10 ENCOUNTER — Ambulatory Visit (INDEPENDENT_AMBULATORY_CARE_PROVIDER_SITE_OTHER): Payer: BC Managed Care – PPO | Admitting: Vascular Surgery

## 2011-10-10 VITALS — BP 152/82 | HR 57 | Resp 16 | Ht 69.0 in | Wt 167.0 lb

## 2011-10-10 DIAGNOSIS — Z48812 Encounter for surgical aftercare following surgery on the circulatory system: Secondary | ICD-10-CM

## 2011-10-10 DIAGNOSIS — I722 Aneurysm of renal artery: Secondary | ICD-10-CM

## 2011-10-10 DIAGNOSIS — I83893 Varicose veins of bilateral lower extremities with other complications: Secondary | ICD-10-CM

## 2011-10-10 NOTE — Addendum Note (Signed)
Addended by: Sharee Pimple on: 10/10/2011 10:51 AM   Modules accepted: Orders

## 2011-10-10 NOTE — Progress Notes (Signed)
Renal artery duplex performed @ [facility] 10/10/2011

## 2011-10-10 NOTE — Progress Notes (Signed)
[facility]ist of [facility]   Patient name: Stacey [name] MRN: [id number] DOB: 1950/09/19 Sex: female   Referred by: Dr. Oliver Barre  Reason for referral:  Chief Complaint  Patient presents with  . Aneurysm    New pt, renal artery aneurysm/Dr. 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 This was initially detected by CT scan and September of 2011. At that time it was a 9 mm aneurysm near the hilum.  She denies any abdominal pain or renal insufficiency.   She is medically managed for her hypertension, hyperlipidemia.  The patient also complains of painful varicose veins on the left leg that have bothered her for many years. She has been evaluated in the past by a vein center and attempted to her compression therapy however this was very uncomfortable for her. She has not had any procedures done. She states that the pain and discomfort is worse after being on her feet all day. She does have some swelling.  Past Medical History  Diagnosis Date  . HYPERLIPIDEMIA 08/01/2006  . OBESITY 09/16/2006  . SICKLE CELL ANEMIA 08/01/2006  . ANXIETY 09/16/2006  . DEPRESSION 09/16/2006  . HYPERTENSION 08/01/2006  . 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 s/p septal myomectomy 4/12 at [facility] with Dr. Silvestre Mesi;  b. echo 5/12: EF 60-65%, LVOT peak 18 mmHg; grade 1 diast dysfxn, mild SAM (improved since myomectomy)  . BRADYCARDIA 10/13/2009  . Diastolic heart failure 09/16/2006  . ALLERGIC RHINITIS 12/07/2006  . ASTHMA 08/01/2006  . VENTRAL HERNIA 12/07/2006  . GASTROINTESTINAL HEMORRHAGE 10/13/2009  . ANGIOEDEMA 03/07/2008  . ABDOMINAL WALL CONTUSION 02/18/2010  . Wheezing 04/01/2010  . DIVERTICULOSIS-COLON 04/06/2010  . DIVERTICULOSIS, COLON, WITH HEMORRHAGE 04/06/2010  . RECTAL BLEEDING 04/06/2010  . CAD (coronary artery disease)     Minimal plaque at cardiac catheterization 1/12: CFX 20%, EF 70%  . LBBB  (left bundle branch block) 06/17/2010  . CONGESTIVE HEART FAILURE 04/06/2010  . Impaired glucose tolerance 06/25/2010  . DM (diabetes mellitus) in pregnancy, delivered w/postpartum condition 08/30/2010  . Type II or unspecified type diabetes mellitus without mention of complication, uncontrolled 08/30/2010  . COPD (chronic obstructive pulmonary disease)   . Myocardial infarction     Past Surgical History  Procedure Date  . Abdominal hysterectomy 1987  . Ventral hernia repair 06/2006  . Myomectomy     Septal  . 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 Marital Status: Divorced    Spouse Name: N/A    Number of Children: N/A  . Years of Education: N/A   Occupational History  . Not on file.   Social History Main Topics  . Smoking status: Former Games developer  . Smokeless tobacco: Not on file   Comment: Quit smoking 2001. Smoked on and off for 20 years. Smoked 2-3 cigars daily  . Alcohol Use: No  . Drug Use: No  . Sexually Active: Not on file   Other Topics Concern  . Not on file   Social History Narrative  . No narrative on file    Family History  Problem Relation Age of Onset  . Cancer Daughter     colon cancer and bleeding disorder  . Hyperlipidemia Daughter   . Heart disease Daughter   . Heart disease Father   . Heart attack Father   . Heart attack Sister   . Hyperlipidemia Sister   .  Heart disease Sister   . Hyperlipidemia Brother     Allergies as of 10/10/2011 - Review Complete 10/10/2011  Allergen Reaction Noted  . Ace inhibitors  01/14/2010  . Aspirin    . Codeine  09/16/2006  . Soy allergy  06/17/2010    Current Outpatient Prescriptions on File Prior to Visit  Medication Sig Dispense Refill  . cetirizine (ZYRTEC) 10 MG tablet Take 10 mg by mouth daily.        . famotidine (PEPCID AC) 10 MG chewable tablet Chew 10 mg by mouth 2 (two) times daily.      . furosemide (LASIX) 20 MG tablet TAKE ONE TABLET BY MOUTH EVERY DAY  90 tablet  3    . KLOR-CON M20 20 MEQ tablet TAKE ONE TABLET BY MOUTH EVERY DAY  30 each  11  . metoprolol succinate (TOPROL-XL) 100 MG 24 hr tablet TAKE ONE TABLET BY MOUTH EVERY DAY  30 tablet  5  . potassium chloride (KLOR-CON) 20 MEQ packet Take 20 mEq by mouth daily.      Marland Kitchen topiramate (TOPAMAX) 200 MG tablet Take 200 mg by mouth 2 (two) times daily.        Marland Kitchen acetaminophen (TYLENOL 8 HOUR) 650 MG CR tablet 1 by mouth every 4 hours as needed       . albuterol (PROVENTIL HFA;VENTOLIN HFA) 108 (90 BASE) MCG/ACT inhaler Inhale 2 puffs into the lungs every 4 (four) hours as needed for wheezing.  1 Inhaler  1  . atorvastatin (LIPITOR) 10 MG tablet TAKE ONE TABLET BY MOUTH EVERY DAY  90 tablet  3  . chlorpheniramine-HYDROcodone (TUSSIONEX PENNKINETIC ER) 10-8 MG/5ML LQCR Take 5 mLs by mouth every 12 (twelve) hours as needed.  140 mL  1  . gabapentin (NEURONTIN) 300 MG capsule Take 1 capsule (300 mg total) by mouth 3 (three) times daily.  90 capsule  5  . metFORMIN (GLUCOPHAGE) 500 MG tablet Take 1 tablet (500 mg total) by mouth daily with breakfast.  90 tablet  3  . predniSONE (DELTASONE) 10 MG tablet 3 tabs by mouth per day for 3 days,2tabs per day for 3 days,1tab per day for 3 days  18 tablet  0  . promethazine (PHENERGAN) 25 MG tablet Take 1 tablet (25 mg total) by mouth every 6 (six) hours as needed for nausea.  40 tablet  1  . sennosides-docusate sodium (SENOKOT-S) 8.6-50 MG tablet Take 2 tablets by mouth as needed.           REVIEW OF SYSTEMS: Cardiovascular: No chest pain, chest pressure, palpitations, orthopnea, or dyspnea on exertion. No claudication or rest pain,  No history of DVT or phlebitis. Pulmonary: No productive cough, asthma or wheezing. Neurologic: No weakness, paresthesias, aphasia, or amaurosis. No dizziness. Hematologic: No bleeding problems or clotting disorders. Musculoskeletal: No joint pain or joint swelling. Gastrointestinal: No blood in stool or hematemesis Genitourinary: No  dysuria or hematuria. Psychiatric:: No history of major depression. Integumentary: No rashes or ulcers. Constitutional: No fever or chills.  PHYSICAL EXAMINATION: General: The patient appears their stated age.  Vital signs are BP 152/82  Pulse 57  Resp 16  Ht 5\' 9"  (1.753 m)  Wt 167 lb (75.751 kg)  BMI 24.66 kg/m2  SpO2 100% HEENT:  No gross abnormalities Pulmonary: Respirations are non-labored Abdomen: Soft and non-tender . No murmur Musculoskeletal: There are no major deformities.   Neurologic: No focal weakness or paresthesias are detected, Skin: There are no ulcer or rashes noted.  Psychiatric: The patient has normal affect. Cardiovascular: There is a regular rate and rhythm without significant murmur appreciated. Palpable posterior tibial pulses bilaterally. left lateral varicosities  Diagnostic Studies: Duplex ultrasound today shows no evidence of renal artery stenosis. The hilum was not able to be identified due to overlying bowel gas. This is the location of the aneurysm to  Outside Studies/Documentation Historical records were reviewed.  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 Since it has been over 2 years if this is been evaluated with CT scan, I have recommended repeat her CT scan. We have discussed the indications for repair as well as the criteria. We will make further decisions based on what her CT scan shows. #2: The patient is very uncomfortable from her varicose veins and would like to have something done as possible. I told her I would initially recommend thigh-high compression stockings, 20-30 mm of compression. In addition I have encouraged her to keep her legs elevated when possible and to take ibuprofen for discomfort. When she comes back to see me in 6 weeks I  will get a formal venous ultrasound. I have given her a prescription for compression stockings today.     Jorge Ny, M.D. [facility] [place] Office: [phone] Pager:  [phone]

## 2011-10-20 ENCOUNTER — Telehealth: Payer: Self-pay | Admitting: Internal Medicine

## 2011-10-20 NOTE — Telephone Encounter (Signed)
She made an appt. With Dr. Caryl Never for her husband.

## 2011-10-20 NOTE — Telephone Encounter (Signed)
I think Dr Yetta Barre would do very well for her husband, and I would recommend without reservation

## 2011-10-20 NOTE — Telephone Encounter (Signed)
I think Dr Jones would do very well for her husband, and I would recommend without reservation 

## 2011-10-20 NOTE — Telephone Encounter (Signed)
Stacey [name] is a patient of Dr Jonny Ruiz.  She is asking for a recomendation of a doctor for her husband [name] [id number]).  He has several problems and needs to come soon.  I told her about Dr Yetta Barre who has appts in Nov. And told her about the [email or web address] site.  She would like to know who you would recommend.

## 2011-11-18 ENCOUNTER — Encounter: Payer: Self-pay | Admitting: Surgery

## 2011-11-18 ENCOUNTER — Other Ambulatory Visit: Payer: Self-pay | Admitting: Surgery

## 2011-11-18 LAB — BUN: BUN: 16 mg/dL (ref 6–23)

## 2011-11-18 LAB — CREATININE, SERUM: Creat: 0.87 mg/dL (ref 0.50–1.10)

## 2011-11-21 ENCOUNTER — Ambulatory Visit
Admission: RE | Admit: 2011-11-21 | Discharge: 2011-11-21 | Disposition: A | Discharge status: Home / Self Care | Payer: BC Managed Care – PPO | Source: Ambulatory Visit | Attending: Surgery | Admitting: Surgery

## 2011-11-21 ENCOUNTER — Encounter: Payer: Self-pay | Admitting: Surgery

## 2011-11-21 ENCOUNTER — Ambulatory Visit (INDEPENDENT_AMBULATORY_CARE_PROVIDER_SITE_OTHER): Payer: BC Managed Care – PPO | Admitting: Vascular Surgery

## 2011-11-21 ENCOUNTER — Ambulatory Visit (INDEPENDENT_AMBULATORY_CARE_PROVIDER_SITE_OTHER): Payer: BC Managed Care – PPO | Admitting: Surgery

## 2011-11-21 VITALS — BP 151/69 | HR 49 | Resp 16 | Ht 69.0 in | Wt 168.0 lb

## 2011-11-21 DIAGNOSIS — Z48812 Encounter for surgical aftercare following surgery on the circulatory system: Secondary | ICD-10-CM

## 2011-11-21 DIAGNOSIS — I722 Aneurysm of renal artery: Secondary | ICD-10-CM

## 2011-11-21 DIAGNOSIS — M7989 Other specified soft tissue disorders: Secondary | ICD-10-CM

## 2011-11-21 DIAGNOSIS — I83893 Varicose veins of bilateral lower extremities with other complications: Secondary | ICD-10-CM | POA: Insufficient documentation

## 2011-11-21 MED ORDER — IOHEXOL 350 MG/ML SOLN
100.0000 mL | Freq: Once | INTRAVENOUS | Status: AC | PRN
  Administered 2011-11-21: 100 mL via INTRAVENOUS

## 2011-11-21 NOTE — Progress Notes (Signed)
Left lower extremity venous duplex for reflux performed @ [facility] 11/21/2011

## 2011-11-21 NOTE — Progress Notes (Signed)
[facility]ascular and Vein Specialist of [facility]   Patient name: Stacey [name] MRN: [id number] DOB: October 10, 1950 Sex: female     Chief Complaint  Patient presents with  . Re-evaluation    6 wk f/u     HISTORY OF PRESENT ILLNESS: The patient is here today for followup of her renal artery aneurysm and leg swelling. She has undergone a CT scan to further evaluate her aneurysm as we could not see it on ultrasound. She has also been wearing compression stockings which she said were very uncomfortable.  Past Medical History  Diagnosis Date  . HYPERLIPIDEMIA 08/01/2006  . OBESITY 09/16/2006  . SICKLE CELL ANEMIA 08/01/2006  . ANXIETY 09/16/2006  . DEPRESSION 09/16/2006  . HYPERTENSION 08/01/2006  . 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 s/p septal myomectomy 4/12 at [facility] with Dr. Silvestre Mesi;  b. echo 5/12: EF 60-65%, LVOT peak 18 mmHg; grade 1 diast dysfxn, mild SAM (improved since myomectomy)  . BRADYCARDIA 10/13/2009  . Diastolic heart failure 09/16/2006  . ALLERGIC RHINITIS 12/07/2006  . ASTHMA 08/01/2006  . VENTRAL HERNIA 12/07/2006  . GASTROINTESTINAL HEMORRHAGE 10/13/2009  . ANGIOEDEMA 03/07/2008  . ABDOMINAL WALL CONTUSION 02/18/2010  . Wheezing 04/01/2010  . DIVERTICULOSIS-COLON 04/06/2010  . DIVERTICULOSIS, COLON, WITH HEMORRHAGE 04/06/2010  . RECTAL BLEEDING 04/06/2010  . CAD (coronary artery disease)     Minimal plaque at cardiac catheterization 1/12: CFX 20%, EF 70%  . LBBB (left bundle branch block) 06/17/2010  . CONGESTIVE HEART FAILURE 04/06/2010  . Impaired glucose tolerance 06/25/2010  . DM (diabetes mellitus) in pregnancy, delivered w/postpartum condition 08/30/2010  . Type II or unspecified type diabetes mellitus without mention of complication, uncontrolled 08/30/2010  . COPD (chronic obstructive pulmonary disease)   . Myocardial infarction     Past Surgical History  Procedure Date  . Abdominal hysterectomy 1987  . Ventral hernia repair 06/2006  . Myomectomy    Septal  . 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 Marital Status: Married    Spouse Name: N/A    Number of Children: N/A  . Years of Education: N/A   Occupational History  . Not on file.   Social History Main Topics  . Smoking status: Former Games developer  . Smokeless tobacco: Not on file   Comment: Quit smoking 2001. Smoked on and off for 20 years. Smoked 2-3 cigars daily  . Alcohol Use: No  . Drug Use: No  . Sexually Active: Not on file   Other Topics Concern  . Not on file   Social History Narrative  . No narrative on file    Family History  Problem Relation Age of Onset  . Cancer Daughter     colon cancer and bleeding disorder  . Hyperlipidemia Daughter   . Heart disease Daughter   . Heart disease Father   . Heart attack Father   . Heart attack Sister   . Hyperlipidemia Sister   . Heart disease Sister   . Hyperlipidemia Brother     Allergies as of 11/21/2011 - Review Complete 11/21/2011  Allergen Reaction Noted  . Ace inhibitors  01/14/2010  . Aspirin    . Codeine  09/16/2006  . Soy allergy  06/17/2010    Current Outpatient Prescriptions on File Prior to Visit  Medication Sig Dispense Refill  . acetaminophen (TYLENOL 8 HOUR) 650 MG CR tablet 1 by mouth every 4 hours as needed       . albuterol (PROVENTIL  HFA;VENTOLIN HFA) 108 (90 BASE) MCG/ACT inhaler Inhale 2 puffs into the lungs every 4 (four) hours as needed for wheezing.  1 Inhaler  1  . atorvastatin (LIPITOR) 10 MG tablet TAKE ONE TABLET BY MOUTH EVERY DAY  90 tablet  3  . cetirizine (ZYRTEC) 10 MG tablet Take 10 mg by mouth daily.        . chlorpheniramine-HYDROcodone (TUSSIONEX PENNKINETIC ER) 10-8 MG/5ML LQCR Take 5 mLs by mouth every 12 (twelve) hours as needed.  140 mL  1  . famotidine (PEPCID AC) 10 MG chewable tablet Chew 10 mg by mouth 2 (two) times daily.      . furosemide (LASIX) 20 MG tablet TAKE ONE TABLET BY MOUTH EVERY DAY  90 tablet  3  . gabapentin (NEURONTIN)  300 MG capsule Take 1 capsule (300 mg total) by mouth 3 (three) times daily.  90 capsule  5  . KLOR-CON M20 20 MEQ tablet TAKE ONE TABLET BY MOUTH EVERY DAY  30 each  11  . metoprolol succinate (TOPROL-XL) 100 MG 24 hr tablet TAKE ONE TABLET BY MOUTH EVERY DAY  30 tablet  5  . OnabotulinumtoxinA (BOTOX IJ) Inject as directed.      . potassium chloride (KLOR-CON) 20 MEQ packet Take 20 mEq by mouth daily.      . predniSONE (DELTASONE) 10 MG tablet 3 tabs by mouth per day for 3 days,2tabs per day for 3 days,1tab per day for 3 days  18 tablet  0  . sennosides-docusate sodium (SENOKOT-S) 8.6-50 MG tablet Take 2 tablets by mouth as needed.        . topiramate (TOPAMAX) 200 MG tablet Take 200 mg by mouth 2 (two) times daily.        . metFORMIN (GLUCOPHAGE) 500 MG tablet Take 1 tablet (500 mg total) by mouth daily with breakfast.  90 tablet  3  . promethazine (PHENERGAN) 25 MG tablet Take 1 tablet (25 mg total) by mouth every 6 (six) hours as needed for nausea.  40 tablet  1   No current facility-administered medications on file prior to visit.     REVIEW OF SYSTEMS: No change  PHYSICAL EXAMINATION:   Vital signs are BP 151/69  Pulse 49  Resp 16  Ht 5\' 9"  (1.753 m)  Wt 168 lb (76.204 kg)  BMI 24.81 kg/m2  SpO2 100% General: The patient appears their stated age. HEENT:  No gross abnormalities Pulmonary:  Non labored breathing Musculoskeletal: There are no major deformities. Neurologic: No focal weakness or paresthesias are detected, Skin: There are no ulcer or rashes noted. Psychiatric: The patient has normal affect. Cardiovascular: There is a regular rate and rhythm without significant murmur appreciated. Left lateral leg varicosities minimal edema   Diagnostic Studies Venous duplex reveals deep reflux on the left with one area of superficial reflux. Vein diameters range from 0.2-0.3.  I have also reviewed her CT scan of the abdomen and pelvis which shows a relatively stable renal  artery aneurysm maximum diameter is 1.4 mm. 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 I do not believe she would benefit from laser ablation. Because of the pain and discomfort she is having with the varicosities I would entertain stab phlebectomy versus injection. We will contact her to further discuss this that she has to go to attend to a work issue and cannot wait to see Marisue Ivan today  #  2: I feel that the patient's aneurysm is relatively stable. I will try to reevaluate this in one year with a renal artery ultrasound. I do believe it will need to be continued to be followed. Jorge Ny, M.D. [facility] [place] Office: [phone] Pager:  [phone]

## 2011-11-22 NOTE — Addendum Note (Signed)
Addended by: Sharee Pimple on: 11/22/2011 09:09 AM   Modules accepted: Orders

## 2011-12-23 ENCOUNTER — Emergency Department (HOSPITAL_COMMUNITY)
Admission: EM | Admit: 2011-12-23 | Discharge: 2011-12-23 | Disposition: A | Discharge status: Home / Self Care | Payer: BC Managed Care – PPO | Attending: Emergency Medicine | Admitting: Emergency Medicine

## 2011-12-23 ENCOUNTER — Encounter (HOSPITAL_COMMUNITY): Payer: Self-pay | Admitting: *Deleted

## 2011-12-23 DIAGNOSIS — D571 Sickle-cell disease without crisis: Secondary | ICD-10-CM | POA: Insufficient documentation

## 2011-12-23 DIAGNOSIS — E119 Type 2 diabetes mellitus without complications: Secondary | ICD-10-CM | POA: Insufficient documentation

## 2011-12-23 DIAGNOSIS — F3289 Other specified depressive episodes: Secondary | ICD-10-CM | POA: Insufficient documentation

## 2011-12-23 DIAGNOSIS — G500 Trigeminal neuralgia: Secondary | ICD-10-CM

## 2011-12-23 DIAGNOSIS — J449 Chronic obstructive pulmonary disease, unspecified: Secondary | ICD-10-CM | POA: Insufficient documentation

## 2011-12-23 DIAGNOSIS — Z79899 Other long term (current) drug therapy: Secondary | ICD-10-CM | POA: Insufficient documentation

## 2011-12-23 DIAGNOSIS — Z8679 Personal history of other diseases of the circulatory system: Secondary | ICD-10-CM | POA: Insufficient documentation

## 2011-12-23 DIAGNOSIS — I10 Essential (primary) hypertension: Secondary | ICD-10-CM | POA: Insufficient documentation

## 2011-12-23 DIAGNOSIS — I2510 Atherosclerotic heart disease of native coronary artery without angina pectoris: Secondary | ICD-10-CM | POA: Insufficient documentation

## 2011-12-23 DIAGNOSIS — J4489 Other specified chronic obstructive pulmonary disease: Secondary | ICD-10-CM | POA: Insufficient documentation

## 2011-12-23 DIAGNOSIS — E785 Hyperlipidemia, unspecified: Secondary | ICD-10-CM | POA: Insufficient documentation

## 2011-12-23 DIAGNOSIS — I252 Old myocardial infarction: Secondary | ICD-10-CM | POA: Insufficient documentation

## 2011-12-23 DIAGNOSIS — J309 Allergic rhinitis, unspecified: Secondary | ICD-10-CM | POA: Insufficient documentation

## 2011-12-23 DIAGNOSIS — F411 Generalized anxiety disorder: Secondary | ICD-10-CM | POA: Insufficient documentation

## 2011-12-23 DIAGNOSIS — J45909 Unspecified asthma, uncomplicated: Secondary | ICD-10-CM | POA: Insufficient documentation

## 2011-12-23 DIAGNOSIS — E669 Obesity, unspecified: Secondary | ICD-10-CM | POA: Insufficient documentation

## 2011-12-23 DIAGNOSIS — I5030 Unspecified diastolic (congestive) heart failure: Secondary | ICD-10-CM | POA: Insufficient documentation

## 2011-12-23 DIAGNOSIS — Z8719 Personal history of other diseases of the digestive system: Secondary | ICD-10-CM | POA: Insufficient documentation

## 2011-12-23 DIAGNOSIS — F329 Major depressive disorder, single episode, unspecified: Secondary | ICD-10-CM | POA: Insufficient documentation

## 2011-12-23 MED ORDER — LORAZEPAM 2 MG/ML IJ SOLN
1.0000 mg | Freq: Once | INTRAMUSCULAR | Status: AC
  Administered 2011-12-23: 1 mg via INTRAVENOUS
  Filled 2011-12-23: qty 1

## 2011-12-23 MED ORDER — HYDROMORPHONE HCL PF 1 MG/ML IJ SOLN
1.0000 mg | Freq: Once | INTRAMUSCULAR | Status: AC
  Administered 2011-12-23: 1 mg via INTRAVENOUS
  Filled 2011-12-23: qty 1

## 2011-12-23 MED ORDER — OXYCODONE-ACETAMINOPHEN 5-325 MG PO TABS: 2.0000 | ORAL_TABLET | ORAL | Status: DC | PRN

## 2011-12-23 NOTE — Discharge Instructions (Signed)
Trigeminal Neuralgia Trigeminal neuralgia is a nerve disorder that causes sudden attacks of severe facial pain. It is caused by damage to the trigeminal nerve, a major nerve in the face. It is more common in women and in the elderly, although it can also happen in younger patients. Attacks last from a few seconds to several minutes and can occur from a couple of times per year to several times per day. Trigeminal neuralgia can be a very distressing and disabling condition. Surgery may be needed in very severe cases if medical treatment does not give relief. HOME CARE INSTRUCTIONS   If your caregiver prescribed medication to help prevent attacks, take as directed.  To help prevent attacks:  Chew on the unaffected side of the mouth.  Avoid touching your face.  Avoid blasts of hot or cold air.  Men may wish to grow a beard to avoid having to shave. SEEK IMMEDIATE MEDICAL CARE IF:  Pain is unbearable and your medicine does not help.  You develop new, unexplained symptoms (problems).  You have problems that may be related to a medication you are taking. Document Released: 01/08/2000 Document Revised: 04/04/2011 Document Reviewed: 11/07/2008 [facility] Patient Information 2013 [place].

## 2011-12-23 NOTE — ED Provider Notes (Signed)
History     CSN: [id number]  Arrival date & time 12/23/11  1820   First MD Initiated Contact with Patient 12/23/11 1906      Chief Complaint  Patient presents with  . Headache    (Consider location/radiation/quality/duration/timing/severity/associated sxs/prior treatment) Patient is a 61 y.o. female presenting with headaches. The history is provided by the patient.  Headache    patient here complaining of right-sided facial pain consistent with her trigeminal neuralgia. She does go to a pain clinic but they're closed today. Denies any headaches, fever, neck pain, vomiting. No rashes to her face. She has used her rescue medications at home without relief. No new trauma to her face. Nothing makes her pain better or worse.  Past Medical History  Diagnosis Date  . HYPERLIPIDEMIA 08/01/2006  . OBESITY 09/16/2006  . SICKLE CELL ANEMIA 08/01/2006  . ANXIETY 09/16/2006  . DEPRESSION 09/16/2006  . HYPERTENSION 08/01/2006  . 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 s/p septal myomectomy 4/12 at [place] [place] with Dr. Evelina Dun;  b. echo 5/12: EF 60-65%, LVOT peak 18 mmHg; grade 1 diast dysfxn, mild SAM (improved since myomectomy)  . BRADYCARDIA 10/13/2009  . Diastolic heart failure [id number]  . ALLERGIC RHINITIS 12/07/2006  . ASTHMA 08/01/2006  . VENTRAL HERNIA 12/07/2006  . GASTROINTESTINAL HEMORRHAGE 10/13/2009  . ANGIOEDEMA 03/07/2008  . ABDOMINAL WALL CONTUSION 02/18/2010  . Wheezing 04/01/2010  . DIVERTICULOSIS-COLON 04/06/2010  . DIVERTICULOSIS, COLON, WITH HEMORRHAGE 04/06/2010  . RECTAL BLEEDING 04/06/2010  . CAD (coronary artery disease)     Minimal plaque at cardiac catheterization 1/12: CFX 20%, EF 70%  . LBBB (left bundle branch block) 06/17/2010  . CONGESTIVE HEART FAILURE 04/06/2010  . Impaired glucose tolerance 06/25/2010  . DM (diabetes mellitus) in pregnancy, delivered w/postpartum condition 08/30/2010  . Type II or unspecified type diabetes mellitus without mention of complication,  uncontrolled 08/30/2010  . COPD (chronic obstructive pulmonary disease)   . Myocardial infarction     Past Surgical History  Procedure Date  . Abdominal hysterectomy 1987  . Ventral hernia repair 06/2006  . Myomectomy     Septal  . 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 Cancer Daughter     colon cancer and bleeding disorder  . Hyperlipidemia Daughter   . Heart disease Daughter   . Heart disease Father   . Heart attack Father   . Heart attack Sister   . Hyperlipidemia Sister   . Heart disease Sister   . Hyperlipidemia Brother     History  Substance Use Topics  . Smoking status: Former Research scientist (life sciences)  . Smokeless tobacco: Not on file     Comment: Quit smoking 2001. Smoked on and off for 20 years. Smoked 2-3 cigars daily  . Alcohol Use: No    OB History    Grav Para Term Preterm Abortions TAB SAB Ect Mult Living                  Review of Systems  Neurological: Positive for headaches.  All other systems reviewed and are negative.    Allergies  Ace inhibitors; Aspirin; Codeine; and Soy allergy  Home Medications   Current Outpatient Rx  Name  Route  Sig  Dispense  Refill  . ALBUTEROL SULFATE HFA 108 (90 BASE) MCG/ACT IN AERS   Inhalation   Inhale 2 puffs into the lungs every 4 (four) hours as needed for wheezing.   1 Inhaler  1   . ATORVASTATIN CALCIUM 20 MG PO TABS   Oral   Take 20 mg by mouth daily.         Marland Kitchen CETIRIZINE HCL 10 MG PO TABS   Oral   Take 10 mg by mouth daily.           . FUROSEMIDE 20 MG PO TABS      TAKE ONE TABLET BY MOUTH EVERY DAY   90 tablet   3   . METOPROLOL SUCCINATE ER 100 MG PO TB24   Oral   Take 100 mg by mouth daily. Take with or immediately following a meal.         . BOTOX IJ   Injection   Inject as directed.         Marland Kitchen POTASSIUM CHLORIDE 20 MEQ PO PACK   Oral   Take 20 mEq by mouth daily.         Marland Kitchen POTASSIUM CHLORIDE CRYS ER 20 MEQ PO TBCR   Oral   Take  20 mEq by mouth 2 (two) times daily.         . TOPIRAMATE 200 MG PO TABS   Oral   Take 200 mg by mouth 2 (two) times daily.           Marland Kitchen METFORMIN HCL 500 MG PO TABS   Oral   Take 1 tablet (500 mg total) by mouth daily with breakfast.   90 tablet   3   . PROMETHAZINE HCL 25 MG PO TABS   Oral   Take 1 tablet (25 mg total) by mouth every 6 (six) hours as needed for nausea.   40 tablet   1     BP 139/66  Pulse 59  Temp 97.9 F (36.6 C) (Oral)  Resp 18  SpO2 96%  Physical Exam  Nursing note and vitals reviewed. Constitutional: She is oriented to person, place, and time. She appears well-developed and well-nourished.  Non-toxic appearance. No distress.  HENT:  Head: Normocephalic and atraumatic.  Eyes: Conjunctivae normal, EOM and lids are normal. Pupils are equal, round, and reactive to light.  Neck: Normal range of motion. Neck supple. No tracheal deviation present. No mass present.  Cardiovascular: Normal rate, regular rhythm and normal heart sounds.  Exam reveals no gallop.   No murmur heard. Pulmonary/Chest: Effort normal and breath sounds normal. No stridor. No respiratory distress. She has no decreased breath sounds. She has no wheezes. She has no rhonchi. She has no rales.  Abdominal: Soft. Normal appearance and bowel sounds are normal. She exhibits no distension. There is no tenderness. There is no rebound and no CVA tenderness.  Musculoskeletal: Normal range of motion. She exhibits no edema and no tenderness.  Neurological: She is alert and oriented to person, place, and time. She has normal strength. No cranial nerve deficit or sensory deficit. GCS eye subscore is 4. GCS verbal subscore is 5. GCS motor subscore is 6.  Skin: Skin is warm and dry. No abrasion and no rash noted.  Psychiatric: She has a normal mood and affect. Her speech is normal and behavior is normal.    ED Course  Procedures (including critical care time)  Labs Reviewed - No data to display No  results found.   No diagnosis found.    MDM  Pt given meds for headache and feels better        Leota Jacobsen, MD 12/23/11 2021

## 2011-12-23 NOTE — ED Notes (Signed)
Pt has congential nerve in face, worse symptoms with burning and eye pain, dizziness, takes Botox for pain, clinic closed today

## 2011-12-27 ENCOUNTER — Encounter: Payer: Self-pay | Admitting: Internal Medicine

## 2012-01-04 ENCOUNTER — Telehealth: Payer: Self-pay | Admitting: Internal Medicine

## 2012-01-04 ENCOUNTER — Ambulatory Visit (INDEPENDENT_AMBULATORY_CARE_PROVIDER_SITE_OTHER): Payer: BC Managed Care – PPO | Admitting: Internal Medicine

## 2012-01-04 ENCOUNTER — Encounter: Payer: Self-pay | Admitting: Internal Medicine

## 2012-01-04 VITALS — BP 120/90 | HR 47 | Temp 98.1°F | Ht 69.0 in | Wt 161.5 lb

## 2012-01-04 DIAGNOSIS — G500 Trigeminal neuralgia: Secondary | ICD-10-CM

## 2012-01-04 DIAGNOSIS — J019 Acute sinusitis, unspecified: Secondary | ICD-10-CM

## 2012-01-04 DIAGNOSIS — I10 Essential (primary) hypertension: Secondary | ICD-10-CM

## 2012-01-04 MED ORDER — LEVOFLOXACIN 250 MG PO TABS: 250.0000 mg | ORAL_TABLET | Freq: Every day | ORAL | Status: DC

## 2012-01-04 MED ORDER — OXYCODONE-ACETAMINOPHEN 5-325 MG PO TABS: 2.0000 | ORAL_TABLET | ORAL | Status: DC | PRN

## 2012-01-04 MED ORDER — CEFTRIAXONE SODIUM 1 G IJ SOLR
1.0000 g | Freq: Once | INTRAMUSCULAR | Status: AC
Start: 2012-01-04 — End: 2012-01-04
  Administered 2012-01-04: 1 g via INTRAMUSCULAR

## 2012-01-04 NOTE — Assessment & Plan Note (Signed)
I think pain more likely related to acute sinus, ok to refill oxycodone prn,  to f/u any worsening symptoms or concerns

## 2012-01-04 NOTE — Progress Notes (Signed)
Subjective:    Patient ID: Stacey [name], female    DOB: October 14, 1950, 62 y.o.   MRN: [id number]  HPI   Here with 3 days acute onset fever, facial pain, pressure, general weakness and malaise, and greenish d/c, with slight ST, but little to no cough and Pt denies chest pain, increased sob or doe, wheezing, orthopnea, PND, increased LE swelling, palpitations, dizziness or syncope.  Had recent short course oxycodone but now out, pain persists, has hx of trigem neuralgia on right.  Pt denies chest pain, increased sob or doe, wheezing, orthopnea, PND, increased LE swelling, palpitations, dizziness or syncope.   Pt denies polydipsia, polyuria  Pt denies new neurological symptoms such as new headache, or facial or extremity weakness or numbness .cphx Current Outpatient Prescriptions on File Prior to Visit  Medication Sig Dispense Refill  . albuterol (PROVENTIL HFA;VENTOLIN HFA) 108 (90 BASE) MCG/ACT inhaler Inhale 2 puffs into the lungs every 4 (four) hours as needed for wheezing.  1 Inhaler  1  . atorvastatin (LIPITOR) 20 MG tablet Take 20 mg by mouth daily.      . cetirizine (ZYRTEC) 10 MG tablet Take 10 mg by mouth daily.        . furosemide (LASIX) 20 MG tablet TAKE ONE TABLET BY MOUTH EVERY DAY  90 tablet  3  . metoprolol succinate (TOPROL-XL) 100 MG 24 hr tablet Take 100 mg by mouth daily. Take with or immediately following a meal.      . potassium chloride (KLOR-CON) 20 MEQ packet Take 20 mEq by mouth daily.      . potassium chloride SA (K-DUR,KLOR-CON) 20 MEQ tablet Take 20 mEq by mouth 2 (two) times daily.      Marland Kitchen topiramate (TOPAMAX) 200 MG tablet Take 200 mg by mouth 2 (two) times daily.        . metFORMIN (GLUCOPHAGE) 500 MG tablet Take 1 tablet (500 mg total) by mouth daily with breakfast.  90 tablet  3  . OnabotulinumtoxinA (BOTOX IJ) Inject as directed.      . promethazine (PHENERGAN) 25 MG tablet Take 1 tablet (25 mg total) by mouth every 6 (six) hours as needed for nausea.  40  tablet  1   No current facility-administered medications on file prior to visit.   ,Review of Systems  Constitutional: Negative for diaphoresis and unexpected weight change.  HENT: Negative for tinnitus.   Eyes: Negative for photophobia and visual disturbance.  Respiratory: Negative for choking and stridor.   Gastrointestinal: Negative for vomiting and blood in stool.  Genitourinary: Negative for hematuria and decreased urine volume.  Musculoskeletal: Negative for gait problem.  Skin: Negative for color change and wound.  Neurological: Negative for tremors and numbness.  Psychiatric/Behavioral: Negative for decreased concentration. The patient is not hyperactive.       Objective:   Physical Exam BP 120/90  Pulse 47  Temp 98.1 F (36.7 C) (Oral)  Ht 5\' 9"  (1.753 m)  Wt 161 lb 8 oz (73.256 kg)  BMI 23.85 kg/m2  SpO2 96% Physical Exam  VS noted, mild ill Constitutional: Pt appears well-developed and well-nourished.  HENT: Head: Normocephalic.  Right Ear: External ear normal.  Left Ear: External ear normal.  Bilat tm's mild erythema.  Sinus tender mod to severe right > left max sinus area.  Pharynx mild erythema Eyes: Conjunctivae and EOM are normal but has pufffiness of upper and lower eyelids bilat. Pupils are equal, round, and reactive to light.  Neck: Normal range of  motion. Neck supple.  Cardiovascular: Normal rate and regular rhythm.   Pulmonary/Chest: Effort normal and breath sounds normal.  Neurological: Pt is alert. Not confused  Skin: Skin is warm. No erythema.  Psychiatric: Pt behavior is normal. Thought content normal.     Assessment & Plan:

## 2012-01-04 NOTE — Assessment & Plan Note (Signed)
Mod to severe, for antibx course,  to f/u any worsening symptoms or concerns 

## 2012-01-04 NOTE — Assessment & Plan Note (Signed)
stable overall by hx and exam, most recent data reviewed with pt, and pt to continue medical treatment as before. BP Readings from Last 3 Encounters:  01/04/12 120/90  12/23/11 109/63  11/21/11 151/69

## 2012-01-04 NOTE — Patient Instructions (Addendum)
You had the antibiotic shot today Take all new medications as prescribed - the pill antibiotic, and the oxycodone for pain Continue all other medications as before Please have the pharmacy call with any other refills you may need. You are given the work note today Thank you for enrolling in MyChart. Please follow the instructions below to securely access your online medical record. MyChart allows you to send messages to your doctor, view your test results, renew your prescriptions, schedule appointments, and more. To Log into MyChart, please go to [email or web address], and your Username is: [email or web address]

## 2012-01-04 NOTE — Telephone Encounter (Deleted)
RN fogot to update name on the current encounter 01/04/12 @ 0911 to [place].

## 2012-01-04 NOTE — Telephone Encounter (Addendum)
Patient Information:  Caller Name: Treana  Phone: [phone]  Patient: Stacey, [name]  Gender: Female  DOB: April 03, 1950  Age: 61 Years  PCP: Cathlean Cower (Adults only)   Symptoms  Reason For Call & Symptoms: Caller states she has a Trigeminal Nerve Disorder that can be triggered by a sinus infection.  Pt is experiencing right facial pain and fullness with intermittent intense Trigeminal nerve pain.    Reviewed Health History In EMR: Yes  Reviewed Medications In EMR: Yes  Reviewed Allergies In EMR: Yes  Reviewed Surgeries / Procedures: Yes  Date of Onset of Symptoms: 12/23/2011  Treatments Tried: Pt went to clinic and had Botox injections and is not getting any relief and taking an anti-inflammatory  Treatments Tried Worked: No  Guideline(s) Used:  Sinus Pain and Congestion  Disposition Per Guideline:   Go to Office Now  Reason For Disposition Reached:   Redness or swelling on the cheek, forehead, or around the eye  Advice Given:  Hydration:  Drink plenty of liquids (6-8 glasses of water daily). If the air in your home is dry, use a cool mist humidifier  Reassurance:   Sinus congestion is a normal part of a cold.  Office Follow Up:  Does the office need to follow up with this patient?: No  Instructions For The Office: N/A  Appointment Scheduled:  01/04/2012 10:30:00 Appointment Scheduled Provider:  Cathlean Cower (Adults only)  RN Note:  Pt is having sinus pressure /fullness w/swelling on the right side of her face.

## 2012-01-10 ENCOUNTER — Telehealth: Payer: Self-pay | Admitting: Internal Medicine

## 2012-01-10 NOTE — Telephone Encounter (Signed)
Ok to see Stacey [name] now if pt wants

## 2012-01-10 NOTE — Telephone Encounter (Signed)
Patient Information:  Caller Name: Simi  Phone: [phone]  Patient: Stacey, [name]  Gender: Female  DOB: 29-Sep-1950  Age: 61 Years  PCP: Oliver Barre (Adults only)  Office Follow Up:  Does the office need to follow up with this patient?: Yes  Instructions For The Office: Patient requests Rx or appt; no appts available krs/can  RN Note:  Patient states she is out of pain medication as well.  Per protocol, advised appt within 24 hours.  Patient requests appt 01/10/12 but none available per Epic; info to office for staff management for appt need.  krs/can  Symptoms  Reason For Call & Symptoms: sinus pain and pressure.  Seen in office 01/04/12 and started levaquin and given injection.  States returned to work 01/09/12 but states sinus pain has worsened over the past 24 hours.  States sinus pain is severe.  Continues to have facial swelling on right cheek.  Reviewed Health History In EMR: Yes  Reviewed Medications In EMR: Yes  Reviewed Allergies In EMR: Yes  Reviewed Surgeries / Procedures: Yes  Date of Onset of Symptoms: Unknown  Guideline(s) Used:  Sinus Pain and Congestion  Disposition Per Guideline:   See Today or Tomorrow in Office  Reason For Disposition Reached:   Sinus congestion (pressure, fullness) present > 10 days  Advice Given:  N/A

## 2012-01-10 NOTE — Telephone Encounter (Signed)
Patient informed of MD instructions and agreed.

## 2012-01-12 ENCOUNTER — Encounter: Payer: Self-pay | Admitting: Internal Medicine

## 2012-01-12 ENCOUNTER — Ambulatory Visit (INDEPENDENT_AMBULATORY_CARE_PROVIDER_SITE_OTHER): Payer: BC Managed Care – PPO | Admitting: Internal Medicine

## 2012-01-12 VITALS — BP 182/90 | HR 49 | Temp 98.1°F | Ht 69.0 in | Wt 163.0 lb

## 2012-01-12 DIAGNOSIS — J019 Acute sinusitis, unspecified: Secondary | ICD-10-CM

## 2012-01-12 DIAGNOSIS — G500 Trigeminal neuralgia: Secondary | ICD-10-CM

## 2012-01-12 MED ORDER — OXYCODONE-ACETAMINOPHEN 5-325 MG PO TABS: 2.0000 | ORAL_TABLET | ORAL | Status: DC | PRN

## 2012-01-12 MED ORDER — AMOXICILLIN-POT CLAVULANATE 875-125 MG PO TABS: 1.0000 | ORAL_TABLET | Freq: Two times a day (BID) | ORAL | Status: DC

## 2012-01-12 NOTE — Progress Notes (Signed)
HPI s Pt presents to the clinic today with c/o sinus pain and pressure. She was seen by Dr. Jonny Ruiz on 01/04/2012 and diagnosed with sinusitis. She was started on Levaquin. She was starting to feel better until yesterday when the sinus pain and pressure got worse. She also noticed some swelling of her face. She denies difficulty breathing. The sinus pain is 7/10, constant and is making her trigeminal neuralgia flare up. She does have a history of allergies and asthma and does get recurrent sinus infections.  Review of Systems    Past Medical History  Diagnosis Date  . HYPERLIPIDEMIA 08/01/2006  . OBESITY 09/16/2006  . SICKLE CELL ANEMIA 08/01/2006  . ANXIETY 09/16/2006  . DEPRESSION 09/16/2006  . HYPERTENSION 08/01/2006  . 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 s/p septal myomectomy 4/12 at [facility] with Dr. Silvestre Mesi;  b. echo 5/12: EF 60-65%, LVOT peak 18 mmHg; grade 1 diast dysfxn, mild SAM (improved since myomectomy)  . BRADYCARDIA 10/13/2009  . Diastolic heart failure 09/16/2006  . ALLERGIC RHINITIS 12/07/2006  . ASTHMA 08/01/2006  . VENTRAL HERNIA 12/07/2006  . GASTROINTESTINAL HEMORRHAGE 10/13/2009  . ANGIOEDEMA 03/07/2008  . ABDOMINAL WALL CONTUSION 02/18/2010  . Wheezing 04/01/2010  . DIVERTICULOSIS-COLON 04/06/2010  . DIVERTICULOSIS, COLON, WITH HEMORRHAGE 04/06/2010  . RECTAL BLEEDING 04/06/2010  . CAD (coronary artery disease)     Minimal plaque at cardiac catheterization 1/12: CFX 20%, EF 70%  . LBBB (left bundle branch block) 06/17/2010  . CONGESTIVE HEART FAILURE 04/06/2010  . Impaired glucose tolerance 06/25/2010  . DM (diabetes mellitus) in pregnancy, delivered w/postpartum condition 08/30/2010  . Type II or unspecified type diabetes mellitus without mention of complication, uncontrolled 08/30/2010  . COPD (chronic obstructive pulmonary disease)   . Myocardial infarction     Family History  Problem Relation Age of Onset  . Cancer Daughter     colon cancer and bleeding disorder   . Hyperlipidemia Daughter   . Heart disease Daughter   . Heart disease Father   . Heart attack Father   . Heart attack Sister   . Hyperlipidemia Sister   . Heart disease Sister   . Hyperlipidemia Brother     History   Social History  . Marital Status: Married    Spouse Name: N/A    Number of Children: N/A  . Years of Education: N/A   Occupational History  . Not on file.   Social History Main Topics  . Smoking status: Former Games developer  . Smokeless tobacco: Not on file     Comment: Quit smoking 2001. Smoked on and off for 20 years. Smoked 2-3 cigars daily  . Alcohol Use: No  . Drug Use: No  . Sexually Active: Not on file   Other Topics Concern  . Not on file   Social History Narrative  . No narrative on file    Allergies  Allergen Reactions  . Ace Inhibitors     REACTION: angioedema right eye  . Aspirin     Bleeding   . Codeine     Hallucinations   . Soy Allergy      Constitutional: Positive headache, fatigue and fever. Denies abrupt weight changes.  HEENT:  Positive eye pain, pressure behind the eyes, facial pain, nasal congestion and sore throat. Denies eye redness, ear pain, ringing in the ears, wax buildup, runny nose or bloody nose. Respiratory: Denies difficulty breathing, cough, sputum proddction or shortness of breath.  Cardiovascular: Denies chest pain, chest tightness, palpitations or swelling  in the hands or feet.   No other specific complaints in a complete review of systems (except as listed in HPI above).  Objective:    General: Appears her stated age, well developed, well nourished in NAD. HEENT: Head: normal shape and size, sinuses tender to palpation; Eyes: sclera white, no icterus, conjunctiva pink, PERRLA and EOMs intact; Ears: Tm's gray and intact, normal light reflex; Nose: mucosa pink and moist, septum midline; Throat/Mouth: + PND. Teeth present, mucosa erythematous and moist, no exudate noted, no lesions or ulcerations noted.  Neck: Mild  cervical lymphadenopathy. Neck supple, trachea midline. No massses, lumps or thyromegaly present.  Cardiovascular: Normal rate and rhythm. S1,S2 noted.  No murmur, rubs or gallops noted. No JVD or BLE edema. No carotid bruits noted. Pulmonary/Chest: Normal effort and positive vesicular breath sounds. No respiratory distress. No wheezes, rales or ronchi noted.      Assessment & Plan:   Acute bacterial sinusitis  120 mg DepoMedrol today eRx given for Augmentin x 10 days Refilled percocet for pain, no refills  RTC as needed or if symptoms persist.

## 2012-01-12 NOTE — Patient Instructions (Addendum)

## 2012-01-16 ENCOUNTER — Telehealth: Payer: Self-pay | Admitting: Internal Medicine

## 2012-01-16 NOTE — Telephone Encounter (Signed)
Patient Information:  Caller Name: Euna  Phone: [phone]  Patient: Stacey [name], Stacey [name]  Gender: Female  DOB: 1950/03/21  Age: 61 Years  PCP: Oliver Barre (Adults only)  Office Follow Up:  Does the office need to follow up with this patient?: Yes  Instructions For The Office: Please call regarding work in appointment for today 01/16/12 or if should go to Urgent Care.  RN Note:  Seen 01/12/12; Diagnosed with sinus infection; still on Augmentin BID for 10 days. Using Percocet prn pain. Right eye is swollen half shut and right cheek is swollen. Reports face feels light, like a face lift and intermittent tingling from trigimenal neuralgia.  Symptoms  Reason For Call & Symptoms: Ongoing trigimenal neuralgia pain with tightness on Right side of face to top of head and tingling.  Sinus infections is nearly gone; aksing for more antibiotics.  Right eye puffy/edematous.  Reviewed Health History In EMR: Yes  Reviewed Medications In EMR: Yes  Reviewed Allergies In EMR: Yes  Reviewed Surgeries / Procedures: Yes  Date of Onset of Symptoms: 12/23/2011  Treatments Tried: Gabapentin, warm wash cloth TID-QID  Treatments Tried Worked: No  Guideline(s) Used:  Sinus Pain and Congestion  Disposition Per Guideline:   Go to Office Now  Reason For Disposition Reached:   Redness or swelling on the cheek, forehead, or around the eye  Advice Given:  Reassurance:   Sinus congestion is a normal part of a cold.  Usually home treatment with nasal washes can prevent an actual bacterial sinus infection.  Antibiotics are not helpful for the sinus congestion that occurs with colds.  For a Stuffy Nose - Use Nasal Washes:  Introduction: Saline (salt water) nasal irrigation (nasal wash) is an effective and simple home remedy for treating stuffy nose and sinus congestion. The nose can be irrigated by pouring, spraying, or squirting salt water into the nose and then letting it run back out.  How it  Helps: The salt water rinses out excess mucus, washes out any irritants (dust, allergens) that might be present, and moistens the nasal cavity.  Medicines for a Stuffy or Runny Nose:  Most cold medicines that are available over-the-counter (OTC) are not helpful.  Expected Course:  Sinus congestion from viral upper respiratory infections (colds) usually lasts 5-10 days.

## 2012-01-16 NOTE — Telephone Encounter (Signed)
Ok to see me or regina asap dec 24

## 2012-01-17 ENCOUNTER — Ambulatory Visit (INDEPENDENT_AMBULATORY_CARE_PROVIDER_SITE_OTHER): Payer: BC Managed Care – PPO | Admitting: Internal Medicine

## 2012-01-17 ENCOUNTER — Encounter: Payer: Self-pay | Admitting: Internal Medicine

## 2012-01-17 VITALS — BP 130/84 | HR 66 | Temp 98.0°F | Ht 69.0 in | Wt 163.5 lb

## 2012-01-17 DIAGNOSIS — J329 Chronic sinusitis, unspecified: Secondary | ICD-10-CM

## 2012-01-17 MED ORDER — FLUTICASONE PROPIONATE 50 MCG/ACT NA SUSP: 2.0000 | Freq: Every day | NASAL | Status: DC

## 2012-01-17 MED ORDER — AZITHROMYCIN 250 MG PO TABS: ORAL_TABLET | ORAL | Status: DC

## 2012-01-17 NOTE — Progress Notes (Signed)
HPI  Pt presents to the clinic today with c/o sinus pain and pressure. She was last seen on 01/12/2012 for the same and was prescribed Augmentin. The symptoms are still the same. They are not getting any better. She is wondering if she should try a different antibiotic.  Review of Systems    Past Medical History  Diagnosis Date  . HYPERLIPIDEMIA 08/01/2006  . OBESITY 09/16/2006  . SICKLE CELL ANEMIA 08/01/2006  . ANXIETY 09/16/2006  . DEPRESSION 09/16/2006  . HYPERTENSION 08/01/2006  . 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 s/p septal myomectomy 4/12 at [facility] with Dr. Silvestre Mesi;  b. echo 5/12: EF 60-65%, LVOT peak 18 mmHg; grade 1 diast dysfxn, mild SAM (improved since myomectomy)  . BRADYCARDIA 10/13/2009  . Diastolic heart failure 09/16/2006  . ALLERGIC RHINITIS 12/07/2006  . ASTHMA 08/01/2006  . VENTRAL HERNIA 12/07/2006  . GASTROINTESTINAL HEMORRHAGE 10/13/2009  . ANGIOEDEMA 03/07/2008  . ABDOMINAL WALL CONTUSION 02/18/2010  . Wheezing 04/01/2010  . DIVERTICULOSIS-COLON 04/06/2010  . DIVERTICULOSIS, COLON, WITH HEMORRHAGE 04/06/2010  . RECTAL BLEEDING 04/06/2010  . CAD (coronary artery disease)     Minimal plaque at cardiac catheterization 1/12: CFX 20%, EF 70%  . LBBB (left bundle branch block) 06/17/2010  . CONGESTIVE HEART FAILURE 04/06/2010  . Impaired glucose tolerance 06/25/2010  . DM (diabetes mellitus) in pregnancy, delivered w/postpartum condition 08/30/2010  . Type II or unspecified type diabetes mellitus without mention of complication, uncontrolled 08/30/2010  . COPD (chronic obstructive pulmonary disease)   . Myocardial infarction     Family History  Problem Relation Age of Onset  . Cancer Daughter     colon cancer and bleeding disorder  . Hyperlipidemia Daughter   . Heart disease Daughter   . Heart disease Father   . Heart attack Father   . Heart attack Sister   . Hyperlipidemia Sister   . Heart disease Sister   . Hyperlipidemia Brother     History   Social  History  . Marital Status: Married    Spouse Name: N/A    Number of Children: N/A  . Years of Education: N/A   Occupational History  . Not on file.   Social History Main Topics  . Smoking status: Former Games developer  . Smokeless tobacco: Not on file     Comment: Quit smoking 2001. Smoked on and off for 20 years. Smoked 2-3 cigars daily  . Alcohol Use: No  . Drug Use: No  . Sexually Active: Not on file   Other Topics Concern  . Not on file   Social History Narrative  . No narrative on file    Allergies  Allergen Reactions  . Ace Inhibitors     REACTION: angioedema right eye  . Aspirin     Bleeding   . Codeine     Hallucinations   . Soy Allergy      Constitutional: Positive headache, fatigue and fever. Denies abrupt weight changes.  HEENT:  Positive eye pain, pressure behind the eyes, facial pain, nasal congestion and sore throat. Denies eye redness, ear pain, ringing in the ears, wax buildup, runny nose or bloody nose. Respiratory: Positive cough and thick green sputum production. Denies difficulty breathing or shortness of breath.  Cardiovascular: Denies chest pain, chest tightness, palpitations or swelling in the hands or feet.   No other specific complaints in a complete review of systems (except as listed in HPI above).  Objective:    General: Appears her stated age, well developed,  well nourished in NAD. HEENT: Head: normal shape and size; Eyes: sclera white, no icterus, conjunctiva pink, PERRLA and EOMs intact; Ears: Tm's gray and intact, normal light reflex; Nose: mucosa pink and moist, septum midline; Throat/Mouth: + PND. Teeth present, mucosa pink and moist, no exudate noted, no lesions or ulcerations noted.  Neck: Mild cervical lymphadenopathy. Neck supple, trachea midline. No massses, lumps or thyromegaly present.  Cardiovascular: Normal rate and rhythm. S1,S2 noted.  No murmur, rubs or gallops noted. No JVD or BLE edema. No carotid bruits noted. Pulmonary/Chest:  Normal effort and positive vesicular breath sounds. No respiratory distress. No wheezes, rales or ronchi noted.      Assessment & Plan:   Acute bacterial sinusitis  Can use a Neti Pot which can be purchased from your local drug store. Flonase 2 sprays each nostril for 3 days and then as needed. Levaquin x 7 days  RTC as needed or if symptoms persist.

## 2012-01-17 NOTE — Telephone Encounter (Signed)
Patient informed. 

## 2012-01-17 NOTE — Patient Instructions (Signed)

## 2012-01-27 ENCOUNTER — Telehealth: Payer: Self-pay | Admitting: Internal Medicine

## 2012-01-27 DIAGNOSIS — G500 Trigeminal neuralgia: Secondary | ICD-10-CM

## 2012-01-27 NOTE — Telephone Encounter (Signed)
States has been seen at neurology office at [facility] in past. Has been 3 years since seen there. Continues to have trigeminal neuralgia pain and has been seen in office "3 times in past 3 weeks". Has taken antibiotics as prescribed but has not helped. Is needing MD to refer to neurology again. States is willing to see a different group if MD knows someone who is better than the group at [facility]/Dr. Humberto Seals. Denies headache and states only has pain when cold air or bright light hits face. PLEASE CALL PATIENT AND ADVISE IF CAN REFER TO NEUROLOGY.

## 2012-01-30 NOTE — Telephone Encounter (Signed)
Referral done back to [facility]

## 2012-02-28 ENCOUNTER — Encounter: Payer: Self-pay | Admitting: Internal Medicine

## 2012-02-28 ENCOUNTER — Ambulatory Visit (INDEPENDENT_AMBULATORY_CARE_PROVIDER_SITE_OTHER): Payer: BC Managed Care – PPO | Admitting: Internal Medicine

## 2012-02-28 VITALS — BP 160/100 | HR 56 | Temp 98.7°F | Ht 69.0 in | Wt 161.1 lb

## 2012-02-28 DIAGNOSIS — T783XXA Angioneurotic edema, initial encounter: Secondary | ICD-10-CM

## 2012-02-28 DIAGNOSIS — G500 Trigeminal neuralgia: Secondary | ICD-10-CM

## 2012-02-28 DIAGNOSIS — IMO0001 Reserved for inherently not codable concepts without codable children: Secondary | ICD-10-CM

## 2012-02-28 DIAGNOSIS — I10 Essential (primary) hypertension: Secondary | ICD-10-CM

## 2012-02-28 MED ORDER — OXYCODONE-ACETAMINOPHEN 5-325 MG PO TABS: 2.0000 | ORAL_TABLET | ORAL | Status: DC | PRN

## 2012-02-28 MED ORDER — METHYLPREDNISOLONE ACETATE 80 MG/ML IJ SUSP
80.0000 mg | Freq: Once | INTRAMUSCULAR | Status: AC
  Administered 2012-02-28: 80 mg via INTRAMUSCULAR

## 2012-02-28 MED ORDER — KETOROLAC TROMETHAMINE 30 MG/ML IJ SOLN
30.0000 mg | Freq: Once | INTRAMUSCULAR | Status: AC
  Administered 2012-02-28: 30 mg via INTRAMUSCULAR

## 2012-02-28 MED ORDER — PREDNISONE 10 MG PO TABS: ORAL_TABLET | ORAL | Status: DC

## 2012-02-28 NOTE — Patient Instructions (Addendum)
You had the steroid shot today Please take all new medication as prescribed  - the percocet for pain, and the prednisone You should also take benadryl 50 mg (oral, otc) to help the swelling and any itching you may have Please continue all other medications as before, and refills have been done if requested.

## 2012-03-04 ENCOUNTER — Encounter: Payer: Self-pay | Admitting: Internal Medicine

## 2012-03-04 DIAGNOSIS — T783XXA Angioneurotic edema, initial encounter: Secondary | ICD-10-CM | POA: Insufficient documentation

## 2012-03-04 NOTE — Assessment & Plan Note (Signed)
Stable, for med refill, but unlikely responsible for rest of facial /eye/lip swelling

## 2012-03-04 NOTE — Assessment & Plan Note (Signed)
Etiology unclear, not on ACE, for depomedrol IM today, predpack asd,  to f/u any worsening symptoms or concerns

## 2012-03-04 NOTE — Progress Notes (Signed)
Subjective:    Patient ID: Stacey [name], female    DOB: 1950-11-07, 63 y.o.   MRN: [id number]  HPI  Here with c/o persistent right sided TGN pain but actually has bilat facial swelling and itching for 2 days sudden onset without fever, trauma, sinus or ST, or new med use such as OTC.  No tongue swelling or sob/wheeze or other rash.  Pt denies chest pain, increased sob or doe, wheezing, orthopnea, PND, increased LE swelling, palpitations, dizziness or syncope.   Pt denies polydipsia, polyuria, or low sugar symptoms such as weakness or confusion improved with po intake.  Pt states overall good compliance with meds, trying to follow lower cholesterol, diabetic diet, wt overall stable but little exercise however.   Pt denies new neurological symptoms such as new headache, or facial or extremity weakness or numbness Past Medical History  Diagnosis Date  . HYPERLIPIDEMIA 08/01/2006  . OBESITY 09/16/2006  . SICKLE CELL ANEMIA 08/01/2006  . ANXIETY 09/16/2006  . DEPRESSION 09/16/2006  . HYPERTENSION 08/01/2006  . 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 s/p septal myomectomy 4/12 at [facility] with Dr. Silvestre Mesi;  b. echo 5/12: EF 60-65%, LVOT peak 18 mmHg; grade 1 diast dysfxn, mild SAM (improved since myomectomy)  . BRADYCARDIA 10/13/2009  . Diastolic heart failure 09/16/2006  . ALLERGIC RHINITIS 12/07/2006  . ASTHMA 08/01/2006  . VENTRAL HERNIA 12/07/2006  . GASTROINTESTINAL HEMORRHAGE 10/13/2009  . ANGIOEDEMA 03/07/2008  . ABDOMINAL WALL CONTUSION 02/18/2010  . Wheezing 04/01/2010  . DIVERTICULOSIS-COLON 04/06/2010  . DIVERTICULOSIS, COLON, WITH HEMORRHAGE 04/06/2010  . RECTAL BLEEDING 04/06/2010  . CAD (coronary artery disease)     Minimal plaque at cardiac catheterization 1/12: CFX 20%, EF 70%  . LBBB (left bundle branch block) 06/17/2010  . CONGESTIVE HEART FAILURE 04/06/2010  . Impaired glucose tolerance 06/25/2010  . DM (diabetes mellitus) in pregnancy, delivered w/postpartum condition  08/30/2010  . Type II or unspecified type diabetes mellitus without mention of complication, uncontrolled 08/30/2010  . COPD (chronic obstructive pulmonary disease)   . Myocardial infarction    Past Surgical History  Procedure Laterality Date  . Abdominal hysterectomy  1987  . Ventral hernia repair  06/2006  . Myomectomy      Septal  . 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 She does not have any smokeless tobacco history on file. She reports that she does not drink alcohol or use illicit drugs. family history includes Cancer in her daughter; Heart attack in her father and sister; Heart disease in her daughter, father, and sister; and Hyperlipidemia in her brother, daughter, and sister. Allergies  Allergen Reactions  . Ace Inhibitors     REACTION: angioedema right eye  . Aspirin     Bleeding   . Codeine     Hallucinations   . Soy Allergy    Current Outpatient Prescriptions on File Prior to Visit  Medication Sig Dispense Refill  . albuterol (PROVENTIL HFA;VENTOLIN HFA) 108 (90 BASE) MCG/ACT inhaler Inhale 2 puffs into the lungs every 4 (four) hours as needed for wheezing.  1 Inhaler  1  . atorvastatin (LIPITOR) 20 MG tablet Take 20 mg by mouth daily.      . cetirizine (ZYRTEC) 10 MG tablet Take 10 mg by mouth daily.        . fluticasone (FLONASE) 50 MCG/ACT nasal spray Place 2 sprays into the nose daily.  16 g  6  . metoprolol succinate (TOPROL-XL) 100  MG 24 hr tablet Take 100 mg by mouth daily. Take with or immediately following a meal.      . OnabotulinumtoxinA (BOTOX IJ) Inject as directed.      . potassium chloride (KLOR-CON) 20 MEQ packet Take 20 mEq by mouth daily.      . potassium chloride SA (K-DUR,KLOR-CON) 20 MEQ tablet Take 20 mEq by mouth 2 (two) times daily.      Marland Kitchen topiramate (TOPAMAX) 200 MG tablet Take 200 mg by mouth 2 (two) times daily.        . furosemide (LASIX) 20 MG tablet TAKE ONE TABLET BY MOUTH EVERY DAY  90 tablet  3  .  metFORMIN (GLUCOPHAGE) 500 MG tablet Take 1 tablet (500 mg total) by mouth daily with breakfast.  90 tablet  3   No current facility-administered medications on file prior to visit.   Review of Systems  Constitutional: Negative for unexpected weight change, or unusual diaphoresis  HENT: Negative for tinnitus.   Eyes: Negative for photophobia and visual disturbance.  Respiratory: Negative for choking and stridor.   Gastrointestinal: Negative for vomiting and blood in stool.  Genitourinary: Negative for hematuria and decreased urine volume.  Musculoskeletal: Negative for acute joint swelling Skin: Negative for color change and wound.  Neurological: Negative for tremors and numbness other than noted  Psychiatric/Behavioral: Negative for decreased concentration or  hyperactivity.  '    Objective:   Physical Exam BP 160/100  Pulse 56  Temp(Src) 98.7 F (37.1 C) (Oral)  Ht 5\' 9"  (1.753 m)  Wt 161 lb 2 oz (73.086 kg)  BMI 23.78 kg/m2  SpO2 92% VS noted, not ill appaering Constitutional: Pt appears well-developed and well-nourished.  HENT: Head: NCAT.  Right Ear: External ear normal.  Left Ear: External ear normal.  Eyes: Conjunctivae and EOM are normal. Pupils are equal, round, and reactive to light.  Neck: Normal range of motion. Neck supple.  Cardiovascular: Normal rate and regular rhythm.   Pulmonary/Chest: Effort normal and breath sounds normal.  - no rales or wheezing Neurological: Pt is alert. Not confused  Skin: Diffuse periorbital and bilat facial and upper lip nontender swelling with faint erythema Psychiatric: Pt behavior is normal. Thought content normal. 1+ nervous    Assessment & Plan:

## 2012-03-04 NOTE — Assessment & Plan Note (Signed)
stable overall by history and exam, recent data reviewed with pt, and pt to continue medical treatment as before,  to f/u any worsening symptoms or concerns Lab Results  Component Value Date   HGBA1C 6.8* 08/25/2011   To call for onset polys or cbg > 200 on predpack

## 2012-03-04 NOTE — Assessment & Plan Note (Signed)
Mild elev liekly situational, cont same tx,  to f/u any worsening symptoms or concerns

## 2012-03-09 ENCOUNTER — Ambulatory Visit: Payer: BC Managed Care – PPO | Admitting: Internal Medicine

## 2012-03-10 ENCOUNTER — Other Ambulatory Visit: Payer: Self-pay

## 2012-03-14 ENCOUNTER — Other Ambulatory Visit (INDEPENDENT_AMBULATORY_CARE_PROVIDER_SITE_OTHER): Payer: BC Managed Care – PPO

## 2012-03-14 ENCOUNTER — Ambulatory Visit (INDEPENDENT_AMBULATORY_CARE_PROVIDER_SITE_OTHER): Payer: BC Managed Care – PPO | Admitting: Internal Medicine

## 2012-03-14 ENCOUNTER — Encounter: Payer: Self-pay | Admitting: Internal Medicine

## 2012-03-14 VITALS — BP 122/78 | HR 76 | Temp 98.1°F | Ht 69.0 in | Wt 159.0 lb

## 2012-03-14 DIAGNOSIS — IMO0001 Reserved for inherently not codable concepts without codable children: Secondary | ICD-10-CM

## 2012-03-14 DIAGNOSIS — G500 Trigeminal neuralgia: Secondary | ICD-10-CM

## 2012-03-14 DIAGNOSIS — I509 Heart failure, unspecified: Secondary | ICD-10-CM

## 2012-03-14 DIAGNOSIS — I10 Essential (primary) hypertension: Secondary | ICD-10-CM

## 2012-03-14 DIAGNOSIS — Z0000 Encounter for general adult medical examination without abnormal findings: Secondary | ICD-10-CM

## 2012-03-14 LAB — LIPID PANEL
Cholesterol: 227 mg/dL — ABNORMAL HIGH (ref 0–200)
HDL: 43.8 mg/dL (ref >39.00)
Total CHOL/HDL Ratio: 5
Triglycerides: 362 mg/dL — ABNORMAL HIGH (ref 0.0–149.0)
VLDL: 72.4 mg/dL — ABNORMAL HIGH (ref 0.0–40.0)

## 2012-03-14 LAB — BASIC METABOLIC PANEL
BUN: 13 mg/dL (ref 6–23)
CO2: 25 mEq/L (ref 19–32)
Calcium: 9.4 mg/dL (ref 8.4–10.5)
Chloride: 111 mEq/L (ref 96–112)
Creatinine, Ser: 0.6 mg/dL (ref 0.4–1.2)
GFR: 123.71 mL/min (ref >60.00)
Glucose, Bld: 105 mg/dL — ABNORMAL HIGH (ref 70–99)
Potassium: 3.6 mEq/L (ref 3.5–5.1)
Sodium: 142 mEq/L (ref 135–145)

## 2012-03-14 LAB — HEPATIC FUNCTION PANEL
ALT: 17 U/L (ref 0–35)
AST: 19 U/L (ref 0–37)
Albumin: 3.9 g/dL (ref 3.5–5.2)
Alkaline Phosphatase: 94 U/L (ref 39–117)
Bilirubin, Direct: 0.1 mg/dL (ref 0.0–0.3)
Total Bilirubin: 0.4 mg/dL (ref 0.3–1.2)
Total Protein: 7.3 g/dL (ref 6.0–8.3)

## 2012-03-14 LAB — LDL CHOLESTEROL, DIRECT: Direct LDL: 116.4 mg/dL

## 2012-03-14 LAB — HEMOGLOBIN A1C: Hgb A1c MFr Bld: 5.9 % (ref 4.6–6.5)

## 2012-03-14 NOTE — Assessment & Plan Note (Signed)
stable overall by history and exam, recent data reviewed with pt, and pt to continue medical treatment as before,  to f/u any worsening symptoms or concerns Lab Results  Component Value Date   HGBA1C 5.9 03/14/2012

## 2012-03-14 NOTE — Progress Notes (Signed)
Subjective:    Patient ID: Alesia Morin, female    DOB: Jun 21, 1950, 62 y.o.   MRN: [id number]  HPI Here to f/u; overall doing ok,  Pt denies chest pain, increased sob or doe, wheezing, orthopnea, PND, increased LE swelling, palpitations, dizziness or syncope.  Pt denies polydipsia, polyuria, or low sugar symptoms such as weakness or confusion improved with po intake.  Pt denies new neurological symptoms such as new headache, or facial or extremity weakness or numbness.   Pt states overall good compliance with meds, has been trying to follow lower cholesterol diet, with wt overall stable,  but little exercise however. Is overdue for cardiology f/u.  Has ongoing TG pain, some improved recently but is to f/u with NS tomorrow at [facility].  Migraines have been better s/p botox.  Denies worsening depressive symptoms, suicidal ideation, or panic. Recent angioedema facial resolved.  Past Medical History  Diagnosis Date  . HYPERLIPIDEMIA 08/01/2006  . OBESITY 09/16/2006  . SICKLE CELL ANEMIA 08/01/2006  . ANXIETY 09/16/2006  . DEPRESSION 09/16/2006  . HYPERTENSION 08/01/2006  . 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 s/p septal myomectomy 4/12 at [facility] with Dr. Silvestre Mesi;  b. echo 5/12: EF 60-65%, LVOT peak 18 mmHg; grade 1 diast dysfxn, mild SAM (improved since myomectomy)  . BRADYCARDIA 10/13/2009  . Diastolic heart failure 09/16/2006  . ALLERGIC RHINITIS 12/07/2006  . ASTHMA 08/01/2006  . VENTRAL HERNIA 12/07/2006  . GASTROINTESTINAL HEMORRHAGE 10/13/2009  . ANGIOEDEMA 03/07/2008  . ABDOMINAL WALL CONTUSION 02/18/2010  . Wheezing 04/01/2010  . DIVERTICULOSIS-COLON 04/06/2010  . DIVERTICULOSIS, COLON, WITH HEMORRHAGE 04/06/2010  . RECTAL BLEEDING 04/06/2010  . CAD (coronary artery disease)     Minimal plaque at cardiac catheterization 1/12: CFX 20%, EF 70%  . LBBB (left bundle branch block) 06/17/2010  . CONGESTIVE HEART FAILURE 04/06/2010  . Impaired glucose tolerance 06/25/2010  . DM  (diabetes mellitus) in pregnancy, delivered w/postpartum condition 08/30/2010  . Type II or unspecified type diabetes mellitus without mention of complication, uncontrolled 08/30/2010  . COPD (chronic obstructive pulmonary disease)   . Myocardial infarction    Past Surgical History  Procedure Laterality Date  . Abdominal hysterectomy  1987  . Ventral hernia repair  06/2006  . Myomectomy      Septal  . 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 She does not have any smokeless tobacco history on file. She reports that she does not drink alcohol or use illicit drugs. family history includes Cancer in her daughter; Heart attack in her father and sister; Heart disease in her daughter, father, and sister; and Hyperlipidemia in her brother, daughter, and sister. Allergies  Allergen Reactions  . Ace Inhibitors     REACTION: angioedema right eye  . Aspirin     Bleeding   . Codeine     Hallucinations   . Soy Allergy    Current Outpatient Prescriptions on File Prior to Visit  Medication Sig Dispense Refill  . albuterol (PROVENTIL HFA;VENTOLIN HFA) 108 (90 BASE) MCG/ACT inhaler Inhale 2 puffs into the lungs every 4 (four) hours as needed for wheezing.  1 Inhaler  1  . atorvastatin (LIPITOR) 20 MG tablet Take 20 mg by mouth daily.      . cetirizine (ZYRTEC) 10 MG tablet Take 10 mg by mouth daily.        . fluticasone (FLONASE) 50 MCG/ACT nasal spray Place 2 sprays into the nose daily.  16 g  6  . furosemide (LASIX) 20 MG tablet TAKE ONE TABLET BY MOUTH EVERY DAY  90 tablet  3  . metoprolol succinate (TOPROL-XL) 100 MG 24 hr tablet Take 100 mg by mouth daily. Take with or immediately following a meal.      . OnabotulinumtoxinA (BOTOX IJ) Inject as directed.      Marland Kitchen oxyCODONE-acetaminophen (PERCOCET/ROXICET) 5-325 MG per tablet Take 2 tablets by mouth every 4 (four) hours as needed for pain.  40 tablet  0  . potassium chloride (KLOR-CON) 20 MEQ packet Take 20 mEq by  mouth daily.      . potassium chloride SA (K-DUR,KLOR-CON) 20 MEQ tablet Take 20 mEq by mouth 2 (two) times daily.      . predniSONE (DELTASONE) 10 MG tablet 3 tabs by mouth per day for 3 days,2tabs per day for 3 days,1tab per day for 3 days  18 tablet  0  . topiramate (TOPAMAX) 200 MG tablet Take 200 mg by mouth 2 (two) times daily.        . metFORMIN (GLUCOPHAGE) 500 MG tablet Take 1 tablet (500 mg total) by mouth daily with breakfast.  90 tablet  3   No current facility-administered medications on file prior to visit.   Review of Systems  Constitutional: Negative for unexpected weight change, or unusual diaphoresis  HENT: Negative for tinnitus.   Eyes: Negative for photophobia and visual disturbance.  Respiratory: Negative for choking and stridor.   Gastrointestinal: Negative for vomiting and blood in stool.  Genitourinary: Negative for hematuria and decreased urine volume.  Musculoskeletal: Negative for acute joint swelling Skin: Negative for color change and wound.  Neurological: Negative for tremors and numbness other than noted  Psychiatric/Behavioral: Negative for decreased concentration or  hyperactivity.       Objective:   Physical Exam BP 122/78  Pulse 76  Temp(Src) 98.1 F (36.7 C) (Oral)  Ht 5\' 9"  (1.753 m)  Wt 159 lb (72.122 kg)  BMI 23.47 kg/m2  SpO2 94% VS noted, not ill appeaering Constitutional: Pt appears well-developed and well-nourished.  HENT: Head: NCAT.  Right Ear: External ear normal.  Left Ear: External ear normal.  Eyes: Conjunctivae and EOM are normal. Pupils are equal, round, and reactive to light.  Neck: Normal range of motion. Neck supple.  Cardiovascular: Normal rate and regular rhythm.   Pulmonary/Chest: Effort normal and breath sounds normal.  Neurological: Pt is alert. Not confused , motor intact Skin: Skin is warm. No erythema. No rash Psychiatric: Pt behavior is normal. Thought content normal.     Assessment & Plan:

## 2012-03-14 NOTE — Assessment & Plan Note (Addendum)
Pt has not f/u'd with card/dr hochrein since 2012, last note reviewed, will order echo as intended, and f/u with Dr Antoine Poche next available, o/w stable overall by history and exam, and pt to continue medical treatment as before

## 2012-03-14 NOTE — Assessment & Plan Note (Signed)
For NS f/u tomorrow, cont same meds/tx

## 2012-03-14 NOTE — Assessment & Plan Note (Addendum)
ECG reviewed as per emr, stable overall by history and exam, recent data reviewed with pt, and pt to continue medical treatment as before,  to f/u any worsening symptoms or concerns BP Readings from Last 3 Encounters:  03/14/12 122/78  02/28/12 160/100  01/17/12 130/84

## 2012-03-14 NOTE — Patient Instructions (Addendum)
Please continue all other medications as before, and refills have been done if requested. Please have the pharmacy call with any other refills you may need. Your EKG today was OK Please keep your appointments with your specialists as you have planned - Neurosurgury tomorrow at [facility] will be contacted regarding the referral for: Echocardiogram, and Dr Antoine Poche as you are now due Please call [facility] to make appt for routine pap smear and exam Please go to the LAB in the Basement (turn left off the elevator) for the tests to be done today You will be contacted by phone if any changes need to be made immediately.  Otherwise, you will receive a letter about your results with an explanation, but please check with MyChart first. Please return in 6 months, or sooner if needed, with Lab testing done 3-5 days before

## 2012-03-16 ENCOUNTER — Telehealth: Payer: Self-pay | Admitting: Internal Medicine

## 2012-03-16 NOTE — Telephone Encounter (Signed)
Ok for note; to robin to handle 

## 2012-03-16 NOTE — Telephone Encounter (Signed)
Patient is requesting a work note for the time that she was out of work, she was out of work 2/4-2/7 and again 2/19-2/21, would like  Call back with robin to discuss this

## 2012-03-16 NOTE — Telephone Encounter (Signed)
Competed note as requested and called the patient left detailed message note ready for pickup at the front desk.

## 2012-03-21 ENCOUNTER — Other Ambulatory Visit (HOSPITAL_COMMUNITY): Payer: BC Managed Care – PPO

## 2012-03-28 ENCOUNTER — Ambulatory Visit (HOSPITAL_COMMUNITY): Payer: BC Managed Care – PPO | Attending: Cardiology

## 2012-03-28 DIAGNOSIS — I509 Heart failure, unspecified: Secondary | ICD-10-CM | POA: Insufficient documentation

## 2012-03-28 NOTE — Progress Notes (Signed)
Echocardiogram performed.  

## 2012-03-30 ENCOUNTER — Other Ambulatory Visit: Payer: Self-pay | Admitting: Internal Medicine

## 2012-03-30 ENCOUNTER — Ambulatory Visit: Payer: BC Managed Care – PPO | Admitting: Cardiology

## 2012-04-16 ENCOUNTER — Encounter: Payer: Self-pay | Admitting: Cardiology

## 2012-04-16 ENCOUNTER — Ambulatory Visit (INDEPENDENT_AMBULATORY_CARE_PROVIDER_SITE_OTHER): Payer: BC Managed Care – PPO | Admitting: Cardiology

## 2012-04-16 VITALS — BP 122/74 | HR 73 | Ht 69.0 in | Wt 164.6 lb

## 2012-04-16 DIAGNOSIS — I498 Other specified cardiac arrhythmias: Secondary | ICD-10-CM

## 2012-04-16 DIAGNOSIS — I10 Essential (primary) hypertension: Secondary | ICD-10-CM

## 2012-04-16 DIAGNOSIS — I509 Heart failure, unspecified: Secondary | ICD-10-CM

## 2012-04-16 NOTE — Patient Instructions (Addendum)

## 2012-04-16 NOTE — Progress Notes (Signed)
HPI The patient presents for follow up after a myomectomy for HCM.  Since I last saw her she has had no cardiac complaints although she is having continued problems with trigeminal meralgia. She's due to have referral for possible gamma knife therapy on this. Because of the pain she's having with that she's not been as active. She can get some of the discomfort so severely that it radiates down into her chest. She's not having any new shortness of breath, PND or orthopnea. She's not having any new palpitations, presyncope or syncope. She's had no weight gain or edema.  Allergies  Allergen Reactions  . Ace Inhibitors     REACTION: angioedema right eye  . Aspirin     Bleeding   . Codeine     Hallucinations   . Soy Allergy     Current Outpatient Prescriptions  Medication Sig Dispense Refill  . cetirizine (ZYRTEC) 10 MG tablet Take 10 mg by mouth daily.        . fluticasone (FLONASE) 50 MCG/ACT nasal spray Place 2 sprays into the nose daily.  16 g  6  . furosemide (LASIX) 20 MG tablet TAKE ONE TABLET BY MOUTH EVERY DAY  90 tablet  3  . gabapentin (NEURONTIN) 600 MG tablet Take 600 mg by mouth 3 (three) times daily.      . metoprolol succinate (TOPROL-XL) 100 MG 24 hr tablet TAKE ONE TABLET BY MOUTH EVERY DAY  30 tablet  11  . oxyCODONE-acetaminophen (PERCOCET/ROXICET) 5-325 MG per tablet Take 2 tablets by mouth every 4 (four) hours as needed for pain.  40 tablet  0  . potassium chloride (KLOR-CON) 20 MEQ packet Take 20 mEq by mouth as needed.       . topiramate (TOPAMAX) 200 MG tablet Take 200 mg by mouth daily.       . metFORMIN (GLUCOPHAGE) 500 MG tablet Take 1 tablet (500 mg total) by mouth daily with breakfast.  90 tablet  3   No current facility-administered medications for this visit.    Past Medical History  Diagnosis Date  . HYPERLIPIDEMIA 08/01/2006  . OBESITY 09/16/2006  . SICKLE CELL ANEMIA 08/01/2006  . ANXIETY 09/16/2006  . DEPRESSION 09/16/2006  . HYPERTENSION 08/01/2006  .  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 s/p septal myomectomy 4/12 at [facility] with Dr. Silvestre Mesi;  b. echo 5/12: EF 60-65%, LVOT peak 18 mmHg; grade 1 diast dysfxn, mild SAM (improved since myomectomy)  . BRADYCARDIA 10/13/2009  . Diastolic heart failure 09/16/2006  . ALLERGIC RHINITIS 12/07/2006  . ASTHMA 08/01/2006  . VENTRAL HERNIA 12/07/2006  . GASTROINTESTINAL HEMORRHAGE 10/13/2009  . ANGIOEDEMA 03/07/2008  . ABDOMINAL WALL CONTUSION 02/18/2010  . Wheezing 04/01/2010  . DIVERTICULOSIS-COLON 04/06/2010  . DIVERTICULOSIS, COLON, WITH HEMORRHAGE 04/06/2010  . RECTAL BLEEDING 04/06/2010  . CAD (coronary artery disease)     Minimal plaque at cardiac catheterization 1/12: CFX 20%, EF 70%  . LBBB (left bundle branch block) 06/17/2010  . CONGESTIVE HEART FAILURE 04/06/2010  . Impaired glucose tolerance 06/25/2010  . DM (diabetes mellitus) in pregnancy, delivered w/postpartum condition 08/30/2010  . Type II or unspecified type diabetes mellitus without mention of complication, uncontrolled 08/30/2010  . COPD (chronic obstructive pulmonary disease)     Past Surgical History  Procedure Laterality Date  . Abdominal hysterectomy  1987  . Ventral hernia repair  06/2006  . Myomectomy      Septal    ROS:  As stated in the HPI and negative  for all other systems.;  PHYSICAL EXAM BP 122/74  Pulse 73  Ht 5\' 9"  (1.753 m)  Wt 164 lb 9.6 oz (74.662 kg)  BMI 24.3 kg/m2 GENERAL:  Well appearing HEENT:  Pupils equal round and reactive, fundi not visualized, oral mucosa unremarkable NECK:  No jugular venous distention, waveform within normal limits, carotid upstroke brisk and symmetric, no bruits, no thyromegaly LYMPHATICS:  No cervical, inguinal adenopathy LUNGS:  Clear to auscultation bilaterally BACK:  No CVA tenderness CHEST:  Well healed sternotomy scar. HEART:  PMI not displaced or sustained,S1 and S2 within normal limits, no S3, no S4, no clicks, no rubs, apical systolic murmur increasing with  the strain phase of Valsalva and radiating out the aortic outflow tract. ABD:  Flat, positive bowel sounds normal in frequency in pitch, no bruits, no rebound, no guarding, no midline pulsatile mass, no hepatomegaly, no splenomegaly EXT:  2 plus pulses throughout, no edema, no cyanosis no clubbing SKIN:  No rashes no nodules NEURO:  Cranial nerves II through XII grossly intact, motor grossly intact throughout PSYCH:  Cognitively intact, oriented to person place and time  ASSESSMENT AND PLAN  HCM:  Her gradient across her septum was higher on repeat echo this year than it was immediately following the myomectomy in 2012. She's having no symptoms. I will repeat an echocardiogram before I see her again next year.  HTN:  Her blood pressure is well controlled. She can continue the meds as listed.

## 2012-04-27 DIAGNOSIS — Z0279 Encounter for issue of other medical certificate: Secondary | ICD-10-CM

## 2012-05-04 DIAGNOSIS — J449 Chronic obstructive pulmonary disease, unspecified: Secondary | ICD-10-CM | POA: Insufficient documentation

## 2012-05-04 DIAGNOSIS — I2510 Atherosclerotic heart disease of native coronary artery without angina pectoris: Secondary | ICD-10-CM | POA: Insufficient documentation

## 2012-05-04 DIAGNOSIS — R7303 Prediabetes: Secondary | ICD-10-CM | POA: Insufficient documentation

## 2012-05-04 DIAGNOSIS — I10 Essential (primary) hypertension: Secondary | ICD-10-CM | POA: Insufficient documentation

## 2012-07-24 ENCOUNTER — Other Ambulatory Visit (INDEPENDENT_AMBULATORY_CARE_PROVIDER_SITE_OTHER): Payer: BC Managed Care – PPO

## 2012-07-24 ENCOUNTER — Ambulatory Visit (INDEPENDENT_AMBULATORY_CARE_PROVIDER_SITE_OTHER): Payer: BC Managed Care – PPO | Admitting: Internal Medicine

## 2012-07-24 ENCOUNTER — Encounter: Payer: Self-pay | Admitting: Internal Medicine

## 2012-07-24 ENCOUNTER — Telehealth: Payer: Self-pay

## 2012-07-24 VITALS — BP 150/100 | HR 81 | Temp 98.6°F | Ht 69.0 in | Wt 171.1 lb

## 2012-07-24 DIAGNOSIS — E785 Hyperlipidemia, unspecified: Secondary | ICD-10-CM

## 2012-07-24 DIAGNOSIS — IMO0001 Reserved for inherently not codable concepts without codable children: Secondary | ICD-10-CM

## 2012-07-24 DIAGNOSIS — Z0000 Encounter for general adult medical examination without abnormal findings: Secondary | ICD-10-CM

## 2012-07-24 DIAGNOSIS — M2550 Pain in unspecified joint: Secondary | ICD-10-CM

## 2012-07-24 DIAGNOSIS — I10 Essential (primary) hypertension: Secondary | ICD-10-CM

## 2012-07-24 DIAGNOSIS — R109 Unspecified abdominal pain: Secondary | ICD-10-CM

## 2012-07-24 LAB — URINALYSIS, ROUTINE W REFLEX MICROSCOPIC
Bilirubin Urine: NEGATIVE
Hgb urine dipstick: NEGATIVE
Ketones, ur: NEGATIVE
Leukocytes, UA: NEGATIVE
Nitrite: NEGATIVE
RBC / HPF: NONE SEEN (ref 0–?)
Specific Gravity, Urine: 1.015 (ref 1.000–1.030)
Total Protein, Urine: NEGATIVE
Urine Glucose: NEGATIVE
Urobilinogen, UA: 0.2 (ref 0.0–1.0)
WBC, UA: NONE SEEN (ref 0–?)
pH: 6 (ref 5.0–8.0)

## 2012-07-24 LAB — LIPID PANEL
Cholesterol: 277 mg/dL — ABNORMAL HIGH (ref 0–200)
HDL: 45.5 mg/dL (ref >39.00)
Total CHOL/HDL Ratio: 6
Triglycerides: 588 mg/dL — ABNORMAL HIGH (ref 0.0–149.0)
VLDL: 117.6 mg/dL — ABNORMAL HIGH (ref 0.0–40.0)

## 2012-07-24 LAB — HEPATIC FUNCTION PANEL
ALT: 20 U/L (ref 0–35)
AST: 18 U/L (ref 0–37)
Albumin: 4.1 g/dL (ref 3.5–5.2)
Alkaline Phosphatase: 127 U/L — ABNORMAL HIGH (ref 39–117)
Bilirubin, Direct: 0 mg/dL (ref 0.0–0.3)
Total Bilirubin: 0.5 mg/dL (ref 0.3–1.2)
Total Protein: 7.7 g/dL (ref 6.0–8.3)

## 2012-07-24 LAB — CBC WITH DIFFERENTIAL/PLATELET
Basophils Absolute: 0 10*3/uL (ref 0.0–0.1)
Basophils Relative: 0.3 % (ref 0.0–3.0)
Eosinophils Absolute: 0.1 10*3/uL (ref 0.0–0.7)
Eosinophils Relative: 1.9 % (ref 0.0–5.0)
HCT: 41.2 % (ref 36.0–46.0)
Hemoglobin: 13.5 g/dL (ref 12.0–15.0)
Lymphocytes Relative: 26.7 % (ref 12.0–46.0)
Lymphs Abs: 1.6 10*3/uL (ref 0.7–4.0)
MCHC: 32.8 g/dL (ref 30.0–36.0)
MCV: 78.3 fl (ref 78.0–100.0)
Monocytes Absolute: 0.3 10*3/uL (ref 0.1–1.0)
Monocytes Relative: 5.6 % (ref 3.0–12.0)
Neutro Abs: 4 10*3/uL (ref 1.4–7.7)
Neutrophils Relative %: 65.5 % (ref 43.0–77.0)
Platelets: 214 10*3/uL (ref 150.0–400.0)
RBC: 5.25 Mil/uL — ABNORMAL HIGH (ref 3.87–5.11)
RDW: 16.3 % — ABNORMAL HIGH (ref 11.5–14.6)
WBC: 6.1 10*3/uL (ref 4.5–10.5)

## 2012-07-24 LAB — TSH: TSH: 0.81 u[IU]/mL (ref 0.35–5.50)

## 2012-07-24 LAB — BASIC METABOLIC PANEL
BUN: 14 mg/dL (ref 6–23)
CO2: 25 mEq/L (ref 19–32)
Calcium: 9.5 mg/dL (ref 8.4–10.5)
Chloride: 107 mEq/L (ref 96–112)
Creatinine, Ser: 0.7 mg/dL (ref 0.4–1.2)
GFR: 113.13 mL/min (ref >60.00)
Glucose, Bld: 149 mg/dL — ABNORMAL HIGH (ref 70–99)
Potassium: 4 mEq/L (ref 3.5–5.1)
Sodium: 141 mEq/L (ref 135–145)

## 2012-07-24 LAB — HEMOGLOBIN A1C: Hgb A1c MFr Bld: 7.2 % — ABNORMAL HIGH (ref 4.6–6.5)

## 2012-07-24 LAB — LDL CHOLESTEROL, DIRECT: Direct LDL: 146.1 mg/dL

## 2012-07-24 LAB — SEDIMENTATION RATE: Sed Rate: 13 mm/hr (ref 0–22)

## 2012-07-24 LAB — MICROALBUMIN / CREATININE URINE RATIO
Creatinine,U: 141.2 mg/dL
Microalb Creat Ratio: 2.5 mg/g (ref 0.0–30.0)
Microalb, Ur: 3.6 mg/dL — ABNORMAL HIGH (ref 0.0–1.9)

## 2012-07-24 LAB — RHEUMATOID FACTOR: Rhuematoid fact SerPl-aCnc: 10 IU/mL (ref <=14)

## 2012-07-24 MED ORDER — POTASSIUM CHLORIDE 20 MEQ PO PACK: 20.0000 meq | PACK | ORAL | Status: DC | PRN

## 2012-07-24 MED ORDER — FUROSEMIDE 20 MG PO TABS: ORAL_TABLET | ORAL | Status: DC

## 2012-07-24 MED ORDER — IRBESARTAN 150 MG PO TABS: 150.0000 mg | ORAL_TABLET | Freq: Every day | ORAL | Status: DC

## 2012-07-24 NOTE — Telephone Encounter (Signed)
Called patient informed of MD's response

## 2012-07-24 NOTE — Telephone Encounter (Signed)
Message copied by Pincus Sanes on Tue Jul 24, 2012  1:19 PM ------      Message from: [name]      Created: Tue Jul 24, 2012 11:57 AM       Yes please      ----- Message -----         From: Vladimir Crofts Ewing         Sent: 07/24/2012  11:09 AM           To: [name], MD            The patient would like to know if she is to take the avapro along with topomax?       ------

## 2012-07-24 NOTE — Progress Notes (Signed)
Subjective:    Patient ID: Stacey [name], female    DOB: 06/18/1950, 62 y.o.   MRN: [id number]  HPI  Here for wellness and f/u;  Overall doing ok;  Pt denies CP, worsening SOB, DOE, wheezing, orthopnea, PND, worsening LE edema, palpitations, dizziness or syncope.  Pt denies neurological change such as new headache, facial or extremity weakness.  Pt denies polydipsia, polyuria, or low sugar symptoms. Pt states overall good compliance with treatment and medications, good tolerability, and has been trying to follow lower cholesterol diet.  Pt denies worsening depressive symptoms, suicidal ideation or panic. No fever, night sweats, wt loss, loss of appetite, or other constitutional symptoms.  Pt states good ability with ADL's, has low fall risk, home safety reviewed and adequate, no other significant changes in hearing or vision, and only occasionally active with exercise.  Also with left groin/left lower abd discomfort for 1-2 days, ? UTI.  Also with mult jiont  Pains as well throughout.   Past Medical History  Diagnosis Date  . HYPERLIPIDEMIA 08/01/2006  . OBESITY 09/16/2006  . SICKLE CELL ANEMIA 08/01/2006  . ANXIETY 09/16/2006  . DEPRESSION 09/16/2006  . HYPERTENSION 08/01/2006  . 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 s/p septal myomectomy 4/12 at [place] with Dr. Evelina Dun;  b. echo 5/12: EF 60-65%, LVOT peak 18 mmHg; grade 1 diast dysfxn, mild SAM (improved since myomectomy)  . BRADYCARDIA 10/13/2009  . Diastolic heart failure Q000111Q  . ALLERGIC RHINITIS 12/07/2006  . ASTHMA 08/01/2006  . VENTRAL HERNIA 12/07/2006  . GASTROINTESTINAL HEMORRHAGE 10/13/2009  . ANGIOEDEMA 03/07/2008  . ABDOMINAL WALL CONTUSION 02/18/2010  . Wheezing 04/01/2010  . DIVERTICULOSIS-COLON 04/06/2010  . DIVERTICULOSIS, COLON, WITH HEMORRHAGE 04/06/2010  . RECTAL BLEEDING 04/06/2010  . CAD (coronary artery disease)     Minimal plaque at cardiac catheterization 1/12: CFX 20%, EF 70%  . LBBB (left bundle  branch block) 06/17/2010  . CONGESTIVE HEART FAILURE 04/06/2010  . Impaired glucose tolerance 06/25/2010  . DM (diabetes mellitus) in pregnancy, delivered w/postpartum condition 08/30/2010  . Type II or unspecified type diabetes mellitus without mention of complication, uncontrolled 08/30/2010  . COPD (chronic obstructive pulmonary disease)    Past Surgical History  Procedure Laterality Date  . Abdominal hysterectomy  1987  . Ventral hernia repair  06/2006  . Myomectomy      Septal    reports that she has quit smoking. She does not have any smokeless tobacco history on file. She reports that she does not drink alcohol or use illicit drugs. family history includes Cancer in her daughter; Heart attack in her father and sister; Heart disease in her daughter, father, and sister; and Hyperlipidemia in her brother, daughter, and sister. Allergies  Allergen Reactions  . Ace Inhibitors     REACTION: angioedema right eye  . Aspirin     Bleeding   . Codeine     Hallucinations   . Soy Allergy    Current Outpatient Prescriptions on File Prior to Visit  Medication Sig Dispense Refill  . cetirizine (ZYRTEC) 10 MG tablet Take 10 mg by mouth daily.        . fluticasone (FLONASE) 50 MCG/ACT nasal spray Place 2 sprays into the nose daily.  16 g  6  . gabapentin (NEURONTIN) 600 MG tablet Take 600 mg by mouth 3 (three) times daily.      . metoprolol succinate (TOPROL-XL) 100 MG 24 hr tablet TAKE ONE TABLET BY MOUTH EVERY DAY  30 tablet  11  . topiramate (TOPAMAX) 200 MG tablet Take 200 mg by mouth daily.       . metFORMIN (GLUCOPHAGE) 500 MG tablet Take 1 tablet (500 mg total) by mouth daily with breakfast.  90 tablet  3   No current facility-administered medications on file prior to visit.   Review of Systems Constitutional: Negative for diaphoresis, activity change, appetite change or unexpected weight change.  HENT: Negative for hearing loss, ear pain, facial swelling, mouth sores and neck stiffness.    Eyes: Negative for pain, redness and visual disturbance.  Respiratory: Negative for shortness of breath and wheezing.   Cardiovascular: Negative for chest pain and palpitations.  Gastrointestinal: Negative for diarrhea, blood in stool, abdominal distention or other pain Genitourinary: Negative for hematuria, flank pain or change in urine volume.  Musculoskeletal: Negative for myalgias and joint swelling.  Skin: Negative for color change and wound.  Neurological: Negative for syncope and numbness. other than noted Hematological: Negative for adenopathy.  Psychiatric/Behavioral: Negative for hallucinations, self-injury, decreased concentration and agitation.      Objective:   Physical Exam BP 150/100  Pulse 81  Temp(Src) 98.6 F (37 C) (Oral)  Ht '5\' 9"'$  (1.753 m)  Wt 171 lb 2 oz (77.622 kg)  BMI 25.26 kg/m2  SpO2 95% VS noted,  Constitutional: Pt is oriented to person, place, and time. Appears well-developed and well-nourished.  Head: Normocephalic and atraumatic.  Right Ear: External ear normal.  Left Ear: External ear normal.  Nose: Nose normal.  Mouth/Throat: Oropharynx is clear and moist.  Eyes: Conjunctivae and EOM are normal. Pupils are equal, round, and reactive to light.  Neck: Normal range of motion. Neck supple. No JVD present. No tracheal deviation present.  Cardiovascular: Normal rate, regular rhythm, normal heart sounds and intact distal pulses.   Pulmonary/Chest: Effort normal and breath sounds normal.  Abdominal: Soft. Bowel sounds are normal. There is no tenderness. No HSM  Musculoskeletal: Normal range of motion. Exhibits no edema.  Lymphadenopathy:  Has no cervical adenopathy.  Neurological: Pt is alert and oriented to person, place, and time. Pt has normal reflexes. No cranial nerve deficit.  Skin: Skin is warm and dry. No rash noted. No synovitis Psychiatric:  Has  normal mood and affect. Behavior is normal.     Assessment & Plan:

## 2012-07-24 NOTE — Patient Instructions (Addendum)
Please take all new medication as prescribed - the generic Avapro 150 mg per day Please continue all other medications as before, and refills have been done if requested. Please continue your efforts at being more active, low cholesterol diet, and weight control. You are otherwise up to date with prevention measures today. Please go to the LAB in the Basement (turn left off the elevator) for the tests to be done today You will be contacted by phone if any changes need to be made immediately.  Otherwise, you will receive a letter about your results with an explanation, but please check with MyChart first.  Please remember to sign up for My Chart if you have not done so, as this will be important to you in the future with finding out test results, communicating by private email, and scheduling acute appointments online when needed.  Please return in 3 weeks, or sooner if needed

## 2012-07-24 NOTE — Telephone Encounter (Signed)
Called the patients cell number, no answer and mailbox full

## 2012-07-25 LAB — ANA: Anti Nuclear Antibody(ANA): NEGATIVE

## 2012-07-26 ENCOUNTER — Other Ambulatory Visit: Payer: Self-pay | Admitting: Internal Medicine

## 2012-07-26 MED ORDER — ATORVASTATIN CALCIUM 10 MG PO TABS: 10.0000 mg | ORAL_TABLET | Freq: Every day | ORAL | Status: DC

## 2012-07-27 NOTE — Assessment & Plan Note (Signed)
Benign exam, for UA with labs

## 2012-07-27 NOTE — Assessment & Plan Note (Signed)
stable overall by history and exam, recent data reviewed with pt, and pt to continue medical treatment as before,  to f/u any worsening symptoms or concerns Lab Results  Component Value Date   LDLCALC 84 03/04/2011

## 2012-07-27 NOTE — Assessment & Plan Note (Signed)

## 2012-07-27 NOTE — Assessment & Plan Note (Signed)
stable overall by history and exam, recent data reviewed with pt, and pt to continue medical treatment as before,  to f/u any worsening symptoms or concerns BP Readings from Last 3 Encounters:  07/24/12 150/100  04/16/12 122/74  03/14/12 122/78

## 2012-07-27 NOTE — Assessment & Plan Note (Signed)
stable overall by history and exam, recent data reviewed with pt, and pt to continue medical treatment as before,  to f/u any worsening symptoms or concerns Lab Results  Component Value Date   HGBA1C 7.2* 07/24/2012    

## 2012-08-14 ENCOUNTER — Encounter: Payer: Self-pay | Admitting: Internal Medicine

## 2012-08-14 ENCOUNTER — Ambulatory Visit (INDEPENDENT_AMBULATORY_CARE_PROVIDER_SITE_OTHER): Payer: BC Managed Care – PPO | Admitting: Internal Medicine

## 2012-08-14 VITALS — BP 142/80 | HR 71 | Temp 98.1°F | Ht 69.0 in | Wt 170.5 lb

## 2012-08-14 DIAGNOSIS — I10 Essential (primary) hypertension: Secondary | ICD-10-CM

## 2012-08-14 DIAGNOSIS — E785 Hyperlipidemia, unspecified: Secondary | ICD-10-CM

## 2012-08-14 DIAGNOSIS — IMO0001 Reserved for inherently not codable concepts without codable children: Secondary | ICD-10-CM

## 2012-08-14 MED ORDER — IRBESARTAN 300 MG PO TABS: 300.0000 mg | ORAL_TABLET | Freq: Every day | ORAL | Status: DC

## 2012-08-14 NOTE — Patient Instructions (Addendum)
OK to increase the avapro generic to 300 mg per day Please continue all other medications as before, and refills have been done if requested. I think we can hold off on further blood work today Please have the pharmacy call with any other refills you may need. Please continue your efforts at being more active, low cholesterol diet, and weight control.  Please return in 6 months, or sooner if needed, with Lab testing done 3-5 days before

## 2012-08-14 NOTE — Assessment & Plan Note (Signed)
Tolerating lipitor, to cont all current med and lower chol diet, too soon to re-check labs, will ask as next visit / Lab Results  Component Value Date   LDLCALC 84 03/04/2011

## 2012-08-14 NOTE — Assessment & Plan Note (Signed)
stable overall by history and exam, recent data reviewed with pt, and pt to continue medical treatment as before,  to f/u any worsening symptoms or concerns Lab Results  Component Value Date   HGBA1C 7.2* 07/24/2012

## 2012-08-14 NOTE — Assessment & Plan Note (Signed)
Improved but still mild elev - for increased avapro to 300 qd, for f/u lab next visit

## 2012-08-14 NOTE — Progress Notes (Signed)
Subjective:    Patient ID: Stacey [name], female    DOB: 28-Mar-1950, 62 y.o.   MRN: [id number]  HPI  Here to f/u; overall doing ok,  Pt denies chest pain, increased sob or doe, wheezing, orthopnea, PND, increased LE swelling, palpitations, dizziness or syncope.  Pt denies polydipsia, polyuria, or low sugar symptoms such as weakness or confusion improved with po intake.  Pt denies new neurological symptoms such as new headache, or facial or extremity weakness or numbness.   Pt states overall good compliance with meds, has been trying to follow lower cholesterol, diabetic diet, with wt overall stable,  but little exercise however. Overall good compliance with treatment, and good medicine tolerability. Now on oxacabamezpine 600 bid, off gabapentin per neurology, feels much improved.  Tolerating lipitor. Does have several wks ongoing nasal allergy symptoms with clearish congestion, itch and sneezing, without fever, pain, ST, cough, swelling or wheezing.  Past Medical History  Diagnosis Date  . HYPERLIPIDEMIA 08/01/2006  . OBESITY 09/16/2006  . SICKLE CELL ANEMIA 08/01/2006  . ANXIETY 09/16/2006  . DEPRESSION 09/16/2006  . HYPERTENSION 08/01/2006  . 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 s/p septal myomectomy 4/12 at [place] with Dr. Evelina Dun;  b. echo 5/12: EF 60-65%, LVOT peak 18 mmHg; grade 1 diast dysfxn, mild SAM (improved since myomectomy)  . BRADYCARDIA 10/13/2009  . Diastolic heart failure Q000111Q  . ALLERGIC RHINITIS 12/07/2006  . ASTHMA 08/01/2006  . VENTRAL HERNIA 12/07/2006  . GASTROINTESTINAL HEMORRHAGE 10/13/2009  . ANGIOEDEMA 03/07/2008  . ABDOMINAL WALL CONTUSION 02/18/2010  . Wheezing 04/01/2010  . DIVERTICULOSIS-COLON 04/06/2010  . DIVERTICULOSIS, COLON, WITH HEMORRHAGE 04/06/2010  . RECTAL BLEEDING 04/06/2010  . CAD (coronary artery disease)     Minimal plaque at cardiac catheterization 1/12: CFX 20%, EF 70%  . LBBB (left bundle branch block) 06/17/2010  . CONGESTIVE  HEART FAILURE 04/06/2010  . Impaired glucose tolerance 06/25/2010  . DM (diabetes mellitus) in pregnancy, delivered w/postpartum condition 08/30/2010  . Type II or unspecified type diabetes mellitus without mention of complication, uncontrolled 08/30/2010  . COPD (chronic obstructive pulmonary disease)    Past Surgical History  Procedure Laterality Date  . Abdominal hysterectomy  1987  . Ventral hernia repair  06/2006  . Myomectomy      Septal    reports that she has quit smoking. She does not have any smokeless tobacco history on file. She reports that she does not drink alcohol or use illicit drugs. family history includes Cancer in her daughter; Heart attack in her father and sister; Heart disease in her daughter, father, and sister; and Hyperlipidemia in her brother, daughter, and sister. Allergies  Allergen Reactions  . Ace Inhibitors     REACTION: angioedema right eye  . Aspirin     Bleeding   . Codeine     Hallucinations   . Soy Allergy    Current Outpatient Prescriptions on File Prior to Visit  Medication Sig Dispense Refill  . atorvastatin (LIPITOR) 10 MG tablet Take 1 tablet (10 mg total) by mouth daily.  90 tablet  3  . cetirizine (ZYRTEC) 10 MG tablet Take 10 mg by mouth daily.        . fluticasone (FLONASE) 50 MCG/ACT nasal spray Place 2 sprays into the nose daily.  16 g  6  . furosemide (LASIX) 20 MG tablet TAKE ONE TABLET BY MOUTH EVERY DAY as needed  90 tablet  3  . irbesartan (AVAPRO) 150 MG tablet Take 1 tablet (150  mg total) by mouth daily.  90 tablet  3  . metoprolol succinate (TOPROL-XL) 100 MG 24 hr tablet TAKE ONE TABLET BY MOUTH EVERY DAY  30 tablet  11  . potassium chloride (KLOR-CON) 20 MEQ packet Take 20 mEq by mouth as needed.  90 tablet  1  . topiramate (TOPAMAX) 200 MG tablet Take 200 mg by mouth daily.       . metFORMIN (GLUCOPHAGE) 500 MG tablet Take 1 tablet (500 mg total) by mouth daily with breakfast.  90 tablet  3   No current facility-administered  medications on file prior to visit.   Review of Systems  Constitutional: Negative for unexpected weight change, or unusual diaphoresis  HENT: Negative for tinnitus.   Eyes: Negative for photophobia and visual disturbance.  Respiratory: Negative for choking and stridor.   Gastrointestinal: Negative for vomiting and blood in stool.  Genitourinary: Negative for hematuria and decreased urine volume.  Musculoskeletal: Negative for acute joint swelling Skin: Negative for color change and wound.  Neurological: Negative for tremors and numbness other than noted  Psychiatric/Behavioral: Negative for decreased concentration or  hyperactivity.       Objective:   Physical Exam BP 142/80  Pulse 71  Temp(Src) 98.1 F (36.7 C) (Oral)  Ht '5\' 9"'$  (1.753 m)  Wt 170 lb 8 oz (77.338 kg)  BMI 25.17 kg/m2  SpO2 97% VS noted,  Constitutional: Pt appears well-developed and well-nourished.  HENT: Head: NCAT.  Right Ear: External ear normal.  Left Ear: External ear normal.  Eyes: Conjunctivae and EOM are normal. Pupils are equal, round, and reactive to light.  Neck: Normal range of motion. Neck supple.  Bilat tm's with mild erythema.  Max sinus areas mild tender.  Pharynx with mild erythema, no exudate Cardiovascular: Normal rate and regular rhythm.   Pulmonary/Chest: Effort normal and breath sounds normal.  Abd:  Soft, NT, non-distended, + BS Neurological: Pt is alert. Not confused  Skin: Skin is warm. No erythema.  Psychiatric: Pt behavior is normal. Thought content normal.      Assessment & Plan:

## 2012-09-10 ENCOUNTER — Encounter (HOSPITAL_COMMUNITY)
Admission: EM | Disposition: A | Discharge status: Home / Self Care | Payer: Self-pay | Source: Home / Self Care | Attending: Internal Medicine

## 2012-09-10 ENCOUNTER — Inpatient Hospital Stay (HOSPITAL_COMMUNITY)
Admission: EM | Admit: 2012-09-10 | Discharge: 2012-09-12 | DRG: 550 | Disposition: A | Discharge status: Home / Self Care | Payer: BC Managed Care – PPO | Attending: Internal Medicine | Admitting: Internal Medicine

## 2012-09-10 ENCOUNTER — Encounter (HOSPITAL_COMMUNITY): Payer: Self-pay | Admitting: *Deleted

## 2012-09-10 ENCOUNTER — Emergency Department (HOSPITAL_COMMUNITY): Payer: BC Managed Care – PPO

## 2012-09-10 ENCOUNTER — Telehealth: Payer: Self-pay | Admitting: Internal Medicine

## 2012-09-10 DIAGNOSIS — R002 Palpitations: Secondary | ICD-10-CM

## 2012-09-10 DIAGNOSIS — D571 Sickle-cell disease without crisis: Secondary | ICD-10-CM | POA: Diagnosis present

## 2012-09-10 DIAGNOSIS — Z79899 Other long term (current) drug therapy: Secondary | ICD-10-CM

## 2012-09-10 DIAGNOSIS — E785 Hyperlipidemia, unspecified: Secondary | ICD-10-CM | POA: Diagnosis present

## 2012-09-10 DIAGNOSIS — I517 Cardiomegaly: Secondary | ICD-10-CM

## 2012-09-10 DIAGNOSIS — R0789 Other chest pain: Secondary | ICD-10-CM

## 2012-09-10 DIAGNOSIS — I447 Left bundle-branch block, unspecified: Secondary | ICD-10-CM | POA: Diagnosis present

## 2012-09-10 DIAGNOSIS — I509 Heart failure, unspecified: Secondary | ICD-10-CM | POA: Diagnosis present

## 2012-09-10 DIAGNOSIS — E119 Type 2 diabetes mellitus without complications: Secondary | ICD-10-CM | POA: Diagnosis present

## 2012-09-10 DIAGNOSIS — I10 Essential (primary) hypertension: Secondary | ICD-10-CM | POA: Diagnosis present

## 2012-09-10 DIAGNOSIS — I421 Obstructive hypertrophic cardiomyopathy: Secondary | ICD-10-CM | POA: Diagnosis present

## 2012-09-10 DIAGNOSIS — Z9889 Other specified postprocedural states: Secondary | ICD-10-CM

## 2012-09-10 DIAGNOSIS — I5032 Chronic diastolic (congestive) heart failure: Secondary | ICD-10-CM | POA: Diagnosis present

## 2012-09-10 DIAGNOSIS — Z87891 Personal history of nicotine dependence: Secondary | ICD-10-CM

## 2012-09-10 DIAGNOSIS — I214 Non-ST elevation (NSTEMI) myocardial infarction: Secondary | ICD-10-CM

## 2012-09-10 DIAGNOSIS — I4949 Other premature depolarization: Secondary | ICD-10-CM | POA: Diagnosis present

## 2012-09-10 DIAGNOSIS — IMO0001 Reserved for inherently not codable concepts without codable children: Secondary | ICD-10-CM | POA: Diagnosis present

## 2012-09-10 DIAGNOSIS — R079 Chest pain, unspecified: Secondary | ICD-10-CM

## 2012-09-10 DIAGNOSIS — R072 Precordial pain: Principal | ICD-10-CM | POA: Diagnosis present

## 2012-09-10 DIAGNOSIS — I2510 Atherosclerotic heart disease of native coronary artery without angina pectoris: Secondary | ICD-10-CM | POA: Diagnosis present

## 2012-09-10 DIAGNOSIS — I493 Ventricular premature depolarization: Secondary | ICD-10-CM

## 2012-09-10 HISTORY — PX: LEFT HEART CATH: SHX5946

## 2012-09-10 HISTORY — DX: Chronic diastolic (congestive) heart failure: I50.32

## 2012-09-10 HISTORY — PX: LEFT HEART CATHETERIZATION WITH CORONARY ANGIOGRAM: SHX5451

## 2012-09-10 LAB — CBC WITH DIFFERENTIAL/PLATELET
Basophils Absolute: 0 10*3/uL (ref 0.0–0.1)
Basophils Relative: 0 % (ref 0–1)
Eosinophils Absolute: 0.1 10*3/uL (ref 0.0–0.7)
Eosinophils Relative: 1 % (ref 0–5)
HCT: 39.7 % (ref 36.0–46.0)
Hemoglobin: 13.4 g/dL (ref 12.0–15.0)
Lymphocytes Relative: 23 % (ref 12–46)
Lymphs Abs: 1.5 10*3/uL (ref 0.7–4.0)
MCH: 25.5 pg — ABNORMAL LOW (ref 26.0–34.0)
MCHC: 33.8 g/dL (ref 30.0–36.0)
MCV: 75.5 fL — ABNORMAL LOW (ref 78.0–100.0)
Monocytes Absolute: 0.3 10*3/uL (ref 0.1–1.0)
Monocytes Relative: 5 % (ref 3–12)
Neutro Abs: 4.8 10*3/uL (ref 1.7–7.7)
Neutrophils Relative %: 71 % (ref 43–77)
Platelets: 213 10*3/uL (ref 150–400)
RBC: 5.26 MIL/uL — ABNORMAL HIGH (ref 3.87–5.11)
RDW: 14.8 % (ref 11.5–15.5)
WBC: 6.8 10*3/uL (ref 4.0–10.5)

## 2012-09-10 LAB — TROPONIN I: Troponin I: <0.3 ng/mL (ref <0.30)

## 2012-09-10 LAB — COMPREHENSIVE METABOLIC PANEL
ALT: 22 U/L (ref 0–35)
AST: 18 U/L (ref 0–37)
Albumin: 3.9 g/dL (ref 3.5–5.2)
Alkaline Phosphatase: 149 U/L — ABNORMAL HIGH (ref 39–117)
BUN: 14 mg/dL (ref 6–23)
CO2: 23 mEq/L (ref 19–32)
Calcium: 9.8 mg/dL (ref 8.4–10.5)
Chloride: 106 mEq/L (ref 96–112)
Creatinine, Ser: 0.64 mg/dL (ref 0.50–1.10)
GFR calc Af Amer: >90 mL/min (ref >90)
GFR calc non Af Amer: >90 mL/min (ref >90)
Glucose, Bld: 131 mg/dL — ABNORMAL HIGH (ref 70–99)
Potassium: 3.5 mEq/L (ref 3.5–5.1)
Sodium: 140 mEq/L (ref 135–145)
Total Bilirubin: 0.2 mg/dL — ABNORMAL LOW (ref 0.3–1.2)
Total Protein: 7.5 g/dL (ref 6.0–8.3)

## 2012-09-10 LAB — POCT I-STAT TROPONIN I: Troponin i, poc: 0.11 ng/mL (ref 0.00–0.08)

## 2012-09-10 LAB — MAGNESIUM: Magnesium: 2 mg/dL (ref 1.5–2.5)

## 2012-09-10 LAB — GLUCOSE, CAPILLARY: Glucose-Capillary: 127 mg/dL — ABNORMAL HIGH (ref 70–99)

## 2012-09-10 SURGERY — LEFT HEART CATHETERIZATION WITH CORONARY ANGIOGRAM
Anesthesia: LOCAL

## 2012-09-10 MED ORDER — POTASSIUM CHLORIDE CRYS ER 20 MEQ PO TBCR
40.0000 meq | EXTENDED_RELEASE_TABLET | Freq: Once | ORAL | Status: AC
  Administered 2012-09-10: 40 meq via ORAL
  Filled 2012-09-10: qty 2

## 2012-09-10 MED ORDER — HEPARIN BOLUS VIA INFUSION
4000.0000 [IU] | Freq: Once | INTRAVENOUS | Status: AC
  Administered 2012-09-10: 4000 [IU] via INTRAVENOUS

## 2012-09-10 MED ORDER — NITROGLYCERIN 0.2 MG/ML ON CALL CATH LAB
INTRAVENOUS | Status: AC
  Filled 2012-09-10: qty 1

## 2012-09-10 MED ORDER — ACETAMINOPHEN 325 MG PO TABS: 650.0000 mg | ORAL_TABLET | ORAL | Status: DC | PRN

## 2012-09-10 MED ORDER — HEPARIN SODIUM (PORCINE) 1000 UNIT/ML IJ SOLN
INTRAMUSCULAR | Status: AC
  Filled 2012-09-10: qty 1

## 2012-09-10 MED ORDER — SODIUM CHLORIDE 0.9 % IV SOLN
INTRAVENOUS | Status: AC
  Administered 2012-09-10: 18:00:00 via INTRAVENOUS

## 2012-09-10 MED ORDER — MIDAZOLAM HCL 2 MG/2ML IJ SOLN
INTRAMUSCULAR | Status: AC
  Filled 2012-09-10: qty 2

## 2012-09-10 MED ORDER — SODIUM CHLORIDE 0.9 % IJ SOLN
3.0000 mL | Freq: Two times a day (BID) | INTRAMUSCULAR | Status: DC
  Administered 2012-09-10: 3 mL via INTRAVENOUS
  Administered 2012-09-11: 09:00:00 via INTRAVENOUS
  Administered 2012-09-11: 3 mL via INTRAVENOUS
  Administered 2012-09-12: 11:00:00 via INTRAVENOUS

## 2012-09-10 MED ORDER — TOPIRAMATE 100 MG PO TABS
200.0000 mg | ORAL_TABLET | Freq: Every day | ORAL | Status: DC
  Administered 2012-09-10: 200 mg via ORAL
  Filled 2012-09-10 (×2): qty 2

## 2012-09-10 MED ORDER — ASPIRIN 81 MG PO CHEW
CHEWABLE_TABLET | ORAL | Status: AC
  Filled 2012-09-10: qty 4

## 2012-09-10 MED ORDER — VERAPAMIL HCL 2.5 MG/ML IV SOLN
INTRAVENOUS | Status: AC
  Filled 2012-09-10: qty 2

## 2012-09-10 MED ORDER — INSULIN ASPART 100 UNIT/ML ~~LOC~~ SOLN
0.0000 [IU] | Freq: Three times a day (TID) | SUBCUTANEOUS | Status: DC
  Administered 2012-09-11 – 2012-09-12 (×3): 2 [IU] via SUBCUTANEOUS

## 2012-09-10 MED ORDER — POTASSIUM CHLORIDE CRYS ER 20 MEQ PO TBCR: 20.0000 meq | EXTENDED_RELEASE_TABLET | Freq: Every day | ORAL | Status: DC | PRN

## 2012-09-10 MED ORDER — HEPARIN (PORCINE) IN NACL 2-0.9 UNIT/ML-% IJ SOLN
INTRAMUSCULAR | Status: AC
  Filled 2012-09-10: qty 1000

## 2012-09-10 MED ORDER — METOPROLOL TARTRATE 1 MG/ML IV SOLN
2.5000 mg | Freq: Once | INTRAVENOUS | Status: AC
  Administered 2012-09-10: 2.5 mg via INTRAVENOUS
  Filled 2012-09-10: qty 5

## 2012-09-10 MED ORDER — ONDANSETRON HCL 4 MG/2ML IJ SOLN: 4.0000 mg | Freq: Four times a day (QID) | INTRAMUSCULAR | Status: DC | PRN

## 2012-09-10 MED ORDER — FENTANYL CITRATE 0.05 MG/ML IJ SOLN
INTRAMUSCULAR | Status: AC
  Filled 2012-09-10: qty 2

## 2012-09-10 MED ORDER — OXCARBAZEPINE 300 MG PO TABS
600.0000 mg | ORAL_TABLET | Freq: Two times a day (BID) | ORAL | Status: DC
  Administered 2012-09-10 – 2012-09-12 (×4): 600 mg via ORAL
  Filled 2012-09-10 (×5): qty 2

## 2012-09-10 MED ORDER — FLUTICASONE PROPIONATE 50 MCG/ACT NA SUSP
2.0000 | Freq: Every day | NASAL | Status: DC
  Administered 2012-09-12: 2 via NASAL
  Filled 2012-09-10: qty 16

## 2012-09-10 MED ORDER — POTASSIUM CHLORIDE 20 MEQ PO PACK
40.0000 meq | PACK | Freq: Every day | ORAL | Status: DC
Start: 2012-09-10 — End: 2012-09-10

## 2012-09-10 MED ORDER — NITROGLYCERIN 0.4 MG SL SUBL: 0.4000 mg | SUBLINGUAL_TABLET | SUBLINGUAL | Status: DC | PRN

## 2012-09-10 MED ORDER — LORATADINE 10 MG PO TABS
10.0000 mg | ORAL_TABLET | Freq: Every day | ORAL | Status: DC
  Administered 2012-09-11 – 2012-09-12 (×2): 10 mg via ORAL
  Filled 2012-09-10 (×2): qty 1

## 2012-09-10 MED ORDER — ASPIRIN 81 MG PO CHEW
324.0000 mg | CHEWABLE_TABLET | Freq: Every day | ORAL | Status: DC
  Administered 2012-09-10: 324 mg via ORAL
  Filled 2012-09-10: qty 4
  Filled 2012-09-10: qty 1

## 2012-09-10 MED ORDER — MORPHINE SULFATE 2 MG/ML IJ SOLN
2.0000 mg | INTRAMUSCULAR | Status: DC | PRN
  Administered 2012-09-10: 2 mg via INTRAVENOUS
  Filled 2012-09-10: qty 1

## 2012-09-10 MED ORDER — ASPIRIN EC 81 MG PO TBEC
81.0000 mg | DELAYED_RELEASE_TABLET | Freq: Every day | ORAL | Status: DC
  Administered 2012-09-11 – 2012-09-12 (×2): 81 mg via ORAL
  Filled 2012-09-10 (×2): qty 1

## 2012-09-10 MED ORDER — SODIUM CHLORIDE 0.9 % IV SOLN: 250.0000 mL | INTRAVENOUS | Status: DC | PRN

## 2012-09-10 MED ORDER — SODIUM CHLORIDE 0.9 % IJ SOLN: 3.0000 mL | INTRAMUSCULAR | Status: DC | PRN

## 2012-09-10 MED ORDER — LIDOCAINE HCL (PF) 1 % IJ SOLN
INTRAMUSCULAR | Status: AC
  Filled 2012-09-10: qty 30

## 2012-09-10 MED ORDER — ATORVASTATIN CALCIUM 10 MG PO TABS
10.0000 mg | ORAL_TABLET | Freq: Every day | ORAL | Status: DC
  Administered 2012-09-11 – 2012-09-12 (×2): 10 mg via ORAL
  Filled 2012-09-10 (×2): qty 1

## 2012-09-10 MED ORDER — IRBESARTAN 300 MG PO TABS
300.0000 mg | ORAL_TABLET | Freq: Every day | ORAL | Status: DC
  Administered 2012-09-10 – 2012-09-11 (×2): 300 mg via ORAL
  Filled 2012-09-10 (×3): qty 1

## 2012-09-10 MED ORDER — METOPROLOL SUCCINATE ER 100 MG PO TB24
100.0000 mg | ORAL_TABLET | Freq: Every day | ORAL | Status: DC
  Administered 2012-09-11 – 2012-09-12 (×2): 100 mg via ORAL
  Filled 2012-09-10 (×3): qty 1

## 2012-09-10 MED ORDER — HEPARIN (PORCINE) IN NACL 100-0.45 UNIT/ML-% IJ SOLN
950.0000 [IU]/h | INTRAMUSCULAR | Status: DC
  Administered 2012-09-10: 950 [IU]/h via INTRAVENOUS
  Filled 2012-09-10: qty 250

## 2012-09-10 MED ORDER — PANTOPRAZOLE SODIUM 40 MG PO TBEC
40.0000 mg | DELAYED_RELEASE_TABLET | Freq: Every day | ORAL | Status: DC
  Administered 2012-09-12: 40 mg via ORAL

## 2012-09-10 NOTE — CV Procedure (Signed)
   Cardiac Catheterization Operative Report  Stacey [name] [id number] 8/18/20143:26 PM Oliver Barre, MD  Procedure Performed:  1. Left Heart Catheterization 2. Selective Coronary Angiography 3. Left ventricular angiogram  Operator: Verne Carrow, MD  Indication: 62 yo female with history of HOCM s/p myomectomy admitted with dizziness, chest pain. Initial POC troponin 0.11.                                       Procedure Details: The risks, benefits, complications, treatment options, and expected outcomes were discussed with the patient. The patient and/or family concurred with the proposed plan, giving informed consent. The patient was brought to the cath lab after IV hydration was begun and oral premedication was given. The patient was further sedated with Versed and Fentanyl. I initially prepped the right wrist and obtained access in the right radial artery with a 5/6 slender sheath. I was able to advance the wire into the ascending aorta but due to tortuosity in the subclavian, I could not torque the catheters.  The right groin was prepped and draped in the usual manner. Using the modified Seldinger access technique, a 5 French sheath was placed in the right femoral artery. Standard diagnostic catheters were used to perform selective coronary angiography. A pigtail catheter was used to perform a left ventricular angiogram.   There were no immediate complications. The patient was taken to the recovery area in stable condition.   Hemodynamic Findings: Central aortic pressure: 129/77 Left ventricular pressure: 133/8/16  Angiographic Findings:  Left main:  No obstructive disease.   Left Anterior Descending Artery: Large caliber vessel that courses to the apex. Thre is a large diagonal branch and several small caliber diagonal branches. No obstructive disease.   Circumflex Artery: Large caliber vessel with two large caliber OM branches and a large left posterolateral  branch. No obstructive disease.   Right Coronary Artery: Small non-dominant vessel with no obstructive disease.   Left Ventricular Angiogram: LVEF=65%  Impression: 1. No angiographic evidence of CAD 2. Preserved LV systolic function  Recommendations: No further ischemic workup.        Complications:  None. The patient tolerated the procedure well.

## 2012-09-10 NOTE — ED Notes (Signed)
Heparin verified with Sherrie George, RN

## 2012-09-10 NOTE — Telephone Encounter (Signed)
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  Carin Askeland called them with symptoms (almost passing out) that require them to advise to go to the ER.  Ms. Sharlet Salina was already on her way to [facility].

## 2012-09-10 NOTE — ED Notes (Signed)
Pt is here with fluttering in chest, lightheaded, chest pain, and shortness of breath.  Pt states symptoms started couple days ago and have got worse

## 2012-09-10 NOTE — H&P (Signed)
Patient ID: Stacey [name] MRN: [id number], DOB/AGE: 09/25/1950   Admit date: 09/10/2012  Primary Physician: Cathlean Cower, MD Primary Cardiologist: Lenna Sciara. Hochrein, MD   Pt. Profile:  62 y/o female with h/o HOCM s/p septal myomectomy in 04/2010, who presented to the ED today with complaints of palpitations and chest pain.  Problem List  Past Medical History  Diagnosis Date  . HYPERLIPIDEMIA 08/01/2006  . OBESITY 09/16/2006  . SICKLE CELL ANEMIA 08/01/2006  . ANXIETY 09/16/2006  . DEPRESSION 09/16/2006  . HYPERTENSION 08/01/2006  . 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 s/p septal myomectomy 4/12 at [facility] with Dr. Evelina Dun;  b. echo 5/12: EF 60-65%, LVOT peak 18 mmHg; grade 1 diast dysfxn, mild SAM (improved since myomectomy;  c. 03/2012 Echo: EF 60-65%, mod-sev basal septal asymm hypertrophy, basal septal HK, LVOT grad 84mHg, Gr 1 DD, , SAM, Mild MR, nl RV, PASP 317mg.  . Marland KitchenRADYCARDIA 10/13/2009  . ALLERGIC RHINITIS 12/07/2006  . ASTHMA 08/01/2006  . VENTRAL HERNIA 12/07/2006  . ANGIOEDEMA 03/07/2008  . ABDOMINAL WALL CONTUSION 02/18/2010  . Wheezing 04/01/2010  . DIVERTICULOSIS, COLON, WITH HEMORRHAGE 04/06/2010  . RECTAL BLEEDING 04/06/2010  . CAD (coronary artery disease)     a. 01/2010 : Minimal plaque at cardiac catheterization - CFX 20%, EF 70%  . LBBB (left bundle branch block) 06/17/2010  . Chronic diastolic CHF (congestive heart failure) 04/06/2010    a. In setting of HOCM.  . Marland Kitchenmpaired glucose tolerance 06/25/2010  . DM (diabetes mellitus) in pregnancy, delivered w/postpartum condition 08/30/2010  . Type II or unspecified type diabetes mellitus without mention of complication, uncontrolled 08/30/2010  . COPD (chronic obstructive pulmonary disease)     Past Surgical History  Procedure Laterality Date  . Abdominal hysterectomy  1987  . Ventral hernia repair  06/2006  . Myomectomy      Septal    Allergies  Allergies  Allergen Reactions  . Ace Inhibitors    REACTION: angioedema right eye  . Aspirin     Bleeding   . Codeine     Hallucinations   . Soy Allergy    HPI  89/o female with h/o HOCM s/p septal myomectomy @ [facility] in 2012.  Cath performed prior to surgery showed minimal nonobstructive CAD.  She was last seen in clinic by Dr. HoPercival Spanishn March of this year.  At that time, she was doing reasonably well.  Echo performed prior to that visit showed normal LV fxn with rise in LVOT gradient from 18 mmHg (post-op 2012) to 68 mmHg.  She was in her USOH until about 1 week ago, when she began to experience intermittent palpitations, which were otherwise asymptomatic.  About 2 days ago however, she noted increased frequency of palpitations ("fluttering") associated with lightheadedness, malaise, and wkns.  She had a mild, productive cough (clear sputum) 2 days ago, but this has since resolved.  This AM, she was driving to work and felt more lightheaded, dizzy, and weak, with more frequent palpitations, than she had previously.  She also noted moderately severe substernal chest heaviness radiating into the back of her neck and jaw associated with mild dyspnea.  She arrived at work and felt as though she might pass out and called her husband to let her know that she was going to come home.  While driving home, all of her Ss worsened and she felt as though she might pass out while driving.  She called her PCP's office from her cell phone  and was advised to go to the ER.  She was afraid that if she stopped driving to pull over and call 911, that she might lose consciousness and thus never make the call.  So instead, she kept driving until she got to the ER.  Here, she required assistance getting out of her car and inside.  ECG shows frequent PVC's.  Initial troponin (poc) is elevated @ 0.11.  She continues to complain of 4/10 chest heaviness and generalized malaise, though overall, she is feeling better than earlier.  Home Medications  Prior to Admission  medications   Medication Sig Start Date End Date Taking? Authorizing Provider  atorvastatin (LIPITOR) 10 MG tablet Take 1 tablet (10 mg total) by mouth daily. 07/26/12   Biagio Borg, MD  cetirizine (ZYRTEC) 10 MG tablet Take 10 mg by mouth daily.      Historical Provider, MD  fluticasone (FLONASE) 50 MCG/ACT nasal spray Place 2 sprays into the nose daily. 01/17/12   Webb Silversmith, NP  furosemide (LASIX) 20 MG tablet TAKE ONE TABLET BY MOUTH EVERY DAY as needed 07/24/12   Biagio Borg, MD  irbesartan (AVAPRO) 300 MG tablet Take 1 tablet (300 mg total) by mouth at bedtime. 08/14/12   Biagio Borg, MD  metFORMIN (GLUCOPHAGE) 500 MG tablet Take 1 tablet (500 mg total) by mouth daily with breakfast. 08/30/10 08/30/11  Biagio Borg, MD  metoprolol succinate (TOPROL-XL) 100 MG 24 hr tablet TAKE ONE TABLET BY MOUTH EVERY DAY 03/30/12   Biagio Borg, MD  potassium chloride (KLOR-CON) 20 MEQ packet Take 20 mEq by mouth as needed. 07/24/12   Biagio Borg, MD  topiramate (TOPAMAX) 200 MG tablet Take 200 mg by mouth daily.     Historical Provider, MD   Family History  Family History  Problem Relation Age of Onset  . Cancer Daughter     colon cancer and bleeding disorder  . Hyperlipidemia Daughter   . Heart disease Daughter   . Heart disease Father   . Heart attack Father   . Heart attack Sister   . Hyperlipidemia Sister   . Heart disease Sister   . Hyperlipidemia Brother    Social History  History   Social History  . Marital Status: Married    Spouse Name: N/A    Number of Children: N/A  . Years of Education: N/A   Occupational History  . Not on file.   Social History Main Topics  . Smoking status: Former Research scientist (life sciences)  . Smokeless tobacco: Not on file     Comment: Quit smoking 2001. Smoked on and off for 20 years. Smoked 2-3 cigars daily  . Alcohol Use: No  . Drug Use: No  . Sexual Activity: Not on file   Other Topics Concern  . Not on file   Social History Narrative   Lives in [place] with husband.   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    Review of Systems General:  No chills, subj fever today, no night sweats or weight changes.  Cardiovascular:  +++ chest heaviness with associated dyspnea, palpitations, malaise, wkns, presyncope.  No edema, orthopnea, palpitations, paroxysmal nocturnal dyspnea. Dermatological: No rash, lesions/masses Respiratory: +++ mild, productive cough with clear sputum ~ 2 days ago - resolved.  +++ dyspnea assoc with chest heaviness. Urologic: No hematuria, dysuria Abdominal:   +++ nausea this AM, no vomiting, diarrhea, bright red blood per rectum, melena, or hematemesis.  She has a  h/o GIB/diverticular bleed in 09/2009. Neurologic:  No visual changes, wkns, changes in mental status. All other systems reviewed and are otherwise negative except as noted above.  Physical Exam  Blood pressure 167/90, pulse 69, temperature 99.2 F (37.3 C), temperature source Oral, resp. rate 18, height '5\' 9"'$  (1.753 m), weight 170 lb (77.111 kg), SpO2 96.00%.  General: Pleasant, NAD  Complains of 4/10 chest presssure.   Psych: Normal affect. Neuro: Alert and oriented X 3. Moves all extremities spontaneously. HEENT: Normal  Neck: Supple without bruits  JVP at 9 cm.   Lungs:  Resp regular and unlabored, slightly diminished throughout. Heart: RRR.  S1, S2, no s3, s4, soft systolic murmur (I/VI)    Abdomen: Soft, non-tender, non-distended, BS + x 4.  Extremities: No clubbing, cyanosis or edema. DP/PT/Radials 2+ and equal bilaterally.  Labs  Trop i, poc: 0.11  Lab Results  Component Value Date   WBC 6.8 09/10/2012   HGB 13.4 09/10/2012   HCT 39.7 09/10/2012   MCV 75.5* 09/10/2012   PLT 213 09/10/2012     Recent Labs Lab 09/10/12 1055  NA 140  K 3.5  CL 106  CO2 23  BUN 14  CREATININE 0.64  CALCIUM 9.8  PROT 7.5  BILITOT 0.2*  ALKPHOS 149*  ALT 22  AST 18  GLUCOSE 131*   Radiology/Studies  Dg Chest 2 View  09/10/2012   *RADIOLOGY  REPORT*  Clinical Data: Chest pain.  CHEST - 2 VIEW  Comparison: 03/11/2008.  Findings: Post CABG.  Cardiomegaly.  Central pulmonary vascular prominence unchanged.  Linear atelectatic changes left lung.  No segmental consolidation or pneumothorax.  Tortuous aorta.  IMPRESSION: Post CABG.  Cardiomegaly.  Central pulmonary vascular prominence unchanged.  Linear atelectatic changes left lung.  No segmental consolidation or pneumothorax.  Tortuous aorta.   Original Report Authenticated By: Genia Del, M.D.   ECG  Rsr, 79, freq pvc's/trigeminy, lbbb, lae - no acute st/t changes.  ASSESSMENT AND PLAN  1.  NSTEMI:  Pt presents with a 1 wk h/o progressive palpitations, presyncope, malaise, and wkns with chest heaviness today.  Initial poc troponin is elevated @ 0.11 and she continues to report 4/10 chest heaviness.  ECG shows lbbb (chronic) with freq pvc's/trigeminy.  She continues to have frequent pvc's on tele.  Will add heparin and cycle CE.  If troponin's continue to rise will add ASA.  Give 1 325 mg now  She does have a h/o GIB/diverticular bleed in 09/2009 and we discussed that we will use anticoagulation for the time being and watch H/H closely.  If she continues to r/i, we will plan cath this afternoon.  Cont bb and statin.   Will give IV lopressor and follow BP and telemetry.  NO NITRATES in setting of HOCM.  2.  HOCM:  Will obtain stat echo to re-eval gradient given significant presyncope.  She is hemodynamically stable , actually hypertensive.  3.  Presyncope:  In setting of above and seemingly associated with frequent PVC's.  Supp K+.  She takes BB @ home and took her toprol this AM.  With freq pvc's and hypertension, we will provide low dose IV lopressor here in the ED.  Check Mg.  Echo as above.  4.  Palpitations/PVC's:  See above.  Supp K+, check Mg, IV bb.  Watch for NSVT -> may require EP eval.  5.  HTN:  Follow.  Cont home meds.  6.  HL:  Cont statin.  Signed, Murray Hodgkins,  NP  09/10/2012, 12:50 PM   Patient was examined/interviewed.  I have amended above note to reflect my findings  62 yo with HOCM (s/p myectomy)  Presents with chest pressure, presyncope  She continues to have chest pressure  Exam is remarkable for  BP 179/,  Frequent PVCs  There is minimal elevation of neck veins, lungs CTA  There is no signif outflow murmur  Labs signif for trop 0.11  EKG is nondiagnostic due to LBBB  Plans as noted above  WIll review with EP.  Dorris Carnes

## 2012-09-10 NOTE — Care Management Note (Signed)
    Page 1 of 1   09/10/2012     3:44:03 PM   CARE MANAGEMENT NOTE 09/10/2012  Patient:  Stacey [name], Stacey [name]   Account Number:  [id number]  Date Initiated:  09/10/2012  Documentation initiated by:  Junius Creamer  Subjective/Objective Assessment:   adm w mi     Action/Plan:   lives w husband   Anticipated DC Date:     Anticipated DC Plan:  HOME/SELF CARE      DC Planning Services  CM consult      Choice offered to / List presented to:             Status of service:   Medicare Important Message given?   (If response is "NO", the following Medicare IM given date fields will be blank) Date Medicare IM given:   Date Additional Medicare IM given:    Discharge Disposition:    Per UR Regulation:  Reviewed for med. necessity/level of care/duration of stay  If discussed at Long Length of Stay Meetings, dates discussed:    Comments:

## 2012-09-10 NOTE — Progress Notes (Signed)
ANTICOAGULATION CONSULT NOTE - Initial Consult  Pharmacy Consult for heparin Indication: chest pain/ACS  Allergies  Allergen Reactions  . Ace Inhibitors     REACTION: angioedema right eye  . Aspirin     Bleeding   . Codeine     Hallucinations   . Soy Allergy     Patient Measurements: Height: 5\' 9"  (175.3 cm) Weight: 170 lb (77.111 kg) IBW/kg (Calculated) : 66.2 Heparin Dosing Weight: 77.1kg  Vital Signs: Temp: 99.2 F (37.3 C) (08/18 1149) Temp src: Oral (08/18 1149) BP: 159/87 mmHg (08/18 1230) Pulse Rate: 65 (08/18 1230)  Labs:  Recent Labs  09/10/12 1055  HGB 13.4  HCT 39.7  PLT 213  CREATININE 0.64    Estimated Creatinine Clearance: 77.2 ml/min (by C-G formula based on Cr of 0.64).   Medical History: Past Medical History  Diagnosis Date  . HYPERLIPIDEMIA 08/01/2006  . OBESITY 09/16/2006  . SICKLE CELL ANEMIA 08/01/2006  . ANXIETY 09/16/2006  . DEPRESSION 09/16/2006  . HYPERTENSION 08/01/2006  . 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 s/p septal myomectomy 4/12 at [facility] with Dr. Silvestre Mesi;  b. echo 5/12: EF 60-65%, LVOT peak 18 mmHg; grade 1 diast dysfxn, mild SAM (improved since myomectomy;  c. 03/2012 Echo: EF 60-65%, mod-sev basal septal asymm hypertrophy, basal septal HK, LVOT grad , Gr 1 DD, , SAM, Mild MR, nl RV, PASP .  Marland Kitchen BRADYCARDIA 10/13/2009  . ALLERGIC RHINITIS 12/07/2006  . ASTHMA 08/01/2006  . VENTRAL HERNIA 12/07/2006  . ANGIOEDEMA 03/07/2008  . ABDOMINAL WALL CONTUSION 02/18/2010  . Wheezing 04/01/2010  . DIVERTICULOSIS, COLON, WITH HEMORRHAGE 04/06/2010  . RECTAL BLEEDING 04/06/2010  . CAD (coronary artery disease)     a. 01/2010 : Minimal plaque at cardiac catheterization - CFX 20%, EF 70%  . LBBB (left bundle branch block) 06/17/2010  . Chronic diastolic CHF (congestive heart failure) 04/06/2010    a. In setting of HOCM.  Marland Kitchen Impaired glucose tolerance 06/25/2010  . DM (diabetes mellitus) in pregnancy, delivered w/postpartum  condition 08/30/2010  . Type II or unspecified type diabetes mellitus without mention of complication, uncontrolled 08/30/2010  . COPD (chronic obstructive pulmonary disease)     Medications:  Infusions:  . heparin    . heparin      Assessment: 73 yof presented to the ED with CP. Troponins mildly elevated. To start IV heparin for NSTEMI. H/H + plts are WNL and pt is not on any anticoagulation PTA. Per MD note, pt is unable to tolerate aspirin d/t GIB. Will need to monitor for bleeding closely.   Goal of Therapy:  Heparin level 0.3-0.7 units/ml Monitor platelets by anticoagulation protocol: Yes   Plan:  1. Heparin bolus 4000 units IV x 1 2. Heparin gtt 950 units/hr 3. Check a 6 hour heparin level 4. Daily heparin level and CBC  Cathline Dowen, Drake Leach 09/10/2012,1:07 PM

## 2012-09-10 NOTE — BH Assessment (Deleted)
Per charge nurse Lupita Leash, patient's bed is now available. The bed assignment is 304-1. Per shift report patient was accepted by Julieanne Cotton, NP and Dr. Lucianne Muss. Staff stationed at [facility] was updated with this information.

## 2012-09-10 NOTE — Progress Notes (Signed)
  Echocardiogram 2D Echocardiogram (stat) has been performed.  Jorje Guild 09/10/2012, 2:11 PM

## 2012-09-10 NOTE — Interval H&P Note (Signed)
History and Physical Interval Note:  09/10/2012 2:43 PM  Stacey [name]  has presented today for cardiac cath with the diagnosis of Chest pain, elevated troponin.   The various methods of treatment have been discussed with the patient and family. After consideration of risks, benefits and other options for treatment, the patient has consented to  Procedure(s): LEFT HEART CATHETERIZATION WITH CORONARY ANGIOGRAM (N/A) as a surgical intervention .  The patient's history has been reviewed, patient examined, no change in status, stable for surgery.  I have reviewed the patient's chart and labs.  Questions were answered to the patient's satisfaction.    Cath Lab Visit (complete for each Cath Lab visit)  Clinical Evaluation Leading to the Procedure:   ACS: yes  Non-ACS:    Anginal Classification: CCS II  Anti-ischemic medical therapy: Minimal Therapy (1 class of medications)  Non-Invasive Test Results: No non-invasive testing performed  Prior CABG: No previous CABG        MCALHANY,CHRISTOPHER

## 2012-09-10 NOTE — ED Provider Notes (Addendum)
CSN: [id number]     Arrival date & time 09/10/12  1017 History     First MD Initiated Contact with Patient 09/10/12 1117     Chief Complaint  Patient presents with  . Chest Pain   (Consider location/radiation/quality/duration/timing/severity/associated sxs/prior Treatment) Patient is a 62 y.o. female presenting with chest pain. The history is provided by the patient.  Chest Pain Pain location:  Substernal area Pain quality: aching, dull, radiating and tightness   Pain radiates to:  Neck Pain radiates to the back: yes   Pain severity:  Moderate Onset quality:  Gradual Duration:  3 days Timing:  Intermittent Progression:  Waxing and waning Chronicity:  New Context comment:  Occurs at rest but worse with exertion Relieved by:  Rest Worsened by:  Exertion Associated symptoms: dizziness, nausea, near-syncope and shortness of breath   Associated symptoms: no abdominal pain, no back pain, no cough, no diaphoresis, no headache and no lower extremity edema   Risk factors: high cholesterol and hypertension   Risk factors: no coronary artery disease, no prior DVT/PE and no smoking     Past Medical History  Diagnosis Date  . HYPERLIPIDEMIA 08/01/2006  . OBESITY 09/16/2006  . SICKLE CELL ANEMIA 08/01/2006  . ANXIETY 09/16/2006  . DEPRESSION 09/16/2006  . HYPERTENSION 08/01/2006  . 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 s/p septal myomectomy 4/12 at [facility] with Dr. Evelina Dun;  b. echo 5/12: EF 60-65%, LVOT peak 18 mmHg; grade 1 diast dysfxn, mild SAM (improved since myomectomy)  . BRADYCARDIA 10/13/2009  . Diastolic heart failure [id number]  . ALLERGIC RHINITIS 12/07/2006  . ASTHMA 08/01/2006  . VENTRAL HERNIA 12/07/2006  . GASTROINTESTINAL HEMORRHAGE 10/13/2009  . ANGIOEDEMA 03/07/2008  . ABDOMINAL WALL CONTUSION 02/18/2010  . Wheezing 04/01/2010  . DIVERTICULOSIS-COLON 04/06/2010  . DIVERTICULOSIS, COLON, WITH HEMORRHAGE 04/06/2010  . RECTAL BLEEDING 04/06/2010  . CAD (coronary artery  disease)     Minimal plaque at cardiac catheterization 1/12: CFX 20%, EF 70%  . LBBB (left bundle branch block) 06/17/2010  . CONGESTIVE HEART FAILURE 04/06/2010  . Impaired glucose tolerance 06/25/2010  . DM (diabetes mellitus) in pregnancy, delivered w/postpartum condition 08/30/2010  . Type II or unspecified type diabetes mellitus without mention of complication, uncontrolled 08/30/2010  . COPD (chronic obstructive pulmonary disease)    Past Surgical History  Procedure Laterality Date  . Abdominal hysterectomy  1987  . Ventral hernia repair  06/2006  . Myomectomy      Septal   Family History  Problem Relation Age of Onset  . Cancer Daughter     colon cancer and bleeding disorder  . Hyperlipidemia Daughter   . Heart disease Daughter   . Heart disease Father   . Heart attack Father   . Heart attack Sister   . Hyperlipidemia Sister   . Heart disease Sister   . Hyperlipidemia Brother    History  Substance Use Topics  . Smoking status: Former Research scientist (life sciences)  . Smokeless tobacco: Not on file     Comment: Quit smoking 2001. Smoked on and off for 20 years. Smoked 2-3 cigars daily  . Alcohol Use: No   OB History   Grav Para Term Preterm Abortions TAB SAB Ect Mult Living                 Review of Systems  Constitutional: Negative for diaphoresis.  Respiratory: Positive for shortness of breath. Negative for cough.   Cardiovascular: Positive for chest pain and near-syncope.  Gastrointestinal: Positive for  nausea. Negative for abdominal pain.  Musculoskeletal: Negative for back pain.  Neurological: Positive for dizziness. Negative for headaches.  All other systems reviewed and are negative.    Allergies  Ace inhibitors; Aspirin; Codeine; and Soy allergy  Home Medications   Current Outpatient Rx  Name  Route  Sig  Dispense  Refill  . atorvastatin (LIPITOR) 10 MG tablet   Oral   Take 1 tablet (10 mg total) by mouth daily.   90 tablet   3   . cetirizine (ZYRTEC) 10 MG tablet    Oral   Take 10 mg by mouth daily.           . fluticasone (FLONASE) 50 MCG/ACT nasal spray   Nasal   Place 2 sprays into the nose daily.   16 g   6   . furosemide (LASIX) 20 MG tablet      TAKE ONE TABLET BY MOUTH EVERY DAY as needed   90 tablet   3   . irbesartan (AVAPRO) 300 MG tablet   Oral   Take 1 tablet (300 mg total) by mouth at bedtime.   90 tablet   3   . EXPIRED: metFORMIN (GLUCOPHAGE) 500 MG tablet   Oral   Take 1 tablet (500 mg total) by mouth daily with breakfast.   90 tablet   3   . metoprolol succinate (TOPROL-XL) 100 MG 24 hr tablet      TAKE ONE TABLET BY MOUTH EVERY DAY   30 tablet   11   . potassium chloride (KLOR-CON) 20 MEQ packet   Oral   Take 20 mEq by mouth as needed.   90 tablet   1   . topiramate (TOPAMAX) 200 MG tablet   Oral   Take 200 mg by mouth daily.           BP 133/54  Pulse 75  Temp(Src) 98.9 F (37.2 C) (Oral)  Resp 18  Ht '5\' 9"'$  (1.753 m)  Wt 170 lb (77.111 kg)  BMI 25.09 kg/m2  SpO2 94% Physical Exam  Nursing note and vitals reviewed. Constitutional: She is oriented to person, place, and time. She appears well-developed and well-nourished. No distress.  HENT:  Head: Normocephalic and atraumatic.  Mouth/Throat: Oropharynx is clear and moist.  Eyes: Conjunctivae and EOM are normal. Pupils are equal, round, and reactive to light.  Neck: Normal range of motion. Neck supple.  Cardiovascular: Normal rate, regular rhythm and intact distal pulses.   No murmur heard. Equal pulses in all extremities  Pulmonary/Chest: Effort normal and breath sounds normal. No respiratory distress. She has no wheezes. She has no rales.  Well healed surgical scar  Abdominal: Soft. She exhibits no distension. There is no tenderness. There is no rebound and no guarding.  Musculoskeletal: Normal range of motion. She exhibits no edema and no tenderness.  Neurological: She is alert and oriented to person, place, and time.  Skin: Skin is warm  and dry. No rash noted. No erythema.  Psychiatric: She has a normal mood and affect. Her behavior is normal.    ED Course   Procedures (including critical care time)  Labs Reviewed  POCT I-STAT TROPONIN I - Abnormal; Notable for the following:    Troponin i, poc 0.11 (*)    All other components within normal limits  PRO B NATRIURETIC PEPTIDE  CBC WITH DIFFERENTIAL  COMPREHENSIVE METABOLIC PANEL   Dg Chest 2 View  09/10/2012   *RADIOLOGY REPORT*  Clinical Data: Chest pain.  CHEST - 2 VIEW  Comparison: 03/11/2008.  Findings: Post CABG.  Cardiomegaly.  Central pulmonary vascular prominence unchanged.  Linear atelectatic changes left lung.  No segmental consolidation or pneumothorax.  Tortuous aorta.  IMPRESSION: Post CABG.  Cardiomegaly.  Central pulmonary vascular prominence unchanged.  Linear atelectatic changes left lung.  No segmental consolidation or pneumothorax.  Tortuous aorta.   Original Report Authenticated By: Genia Del, M.D.    Date: 09/10/2012  Rate: 79  Rhythm: normal sinus rhythm and premature ventricular contractions (PVC)  QRS Axis: normal  Intervals: normal  ST/T Wave abnormalities: nonspecific ST/T changes  Conduction Disutrbances:left bundle branch block  Narrative Interpretation:   Old EKG Reviewed: unchanged since prior  1. NSTEMI (non-ST elevated myocardial infarction)     MDM   11:30 AM Patient bedded in [place] [facility] at 11:30 AM but the good story for ACS with intermittent heart flutters, chest pain radiation to the neck, shortness of breath and nausea that has been intermittent for the last 3 days but got worse over the last few hours. The patient has a prior medical history of a cardiac myomectomy for hypertrophic cardiomyopathy 2 years ago but no other cardiac disease. Today she has a left bundle branch block on EKG compared to an EKG in 2012 prior to surgery and has a positive troponin of 0.11. Will discuss case with cardiology. Patient will be given  nitroglycerin but cannot take aspirin due to GI bleeding from diverticulosis.  CBC, CMP, BNP pending. Chest x-ray without acute findings.  Blanchie Dessert, MD 09/10/12 [place], MD 09/10/12 1141

## 2012-09-11 DIAGNOSIS — R55 Syncope and collapse: Secondary | ICD-10-CM

## 2012-09-11 LAB — COMPREHENSIVE METABOLIC PANEL
ALT: 20 U/L (ref 0–35)
AST: 16 U/L (ref 0–37)
Albumin: 3.5 g/dL (ref 3.5–5.2)
Alkaline Phosphatase: 136 U/L — ABNORMAL HIGH (ref 39–117)
BUN: 12 mg/dL (ref 6–23)
CO2: 23 mEq/L (ref 19–32)
Calcium: 9.3 mg/dL (ref 8.4–10.5)
Chloride: 108 mEq/L (ref 96–112)
Creatinine, Ser: 0.69 mg/dL (ref 0.50–1.10)
GFR calc Af Amer: >90 mL/min (ref >90)
GFR calc non Af Amer: >90 mL/min (ref >90)
Glucose, Bld: 140 mg/dL — ABNORMAL HIGH (ref 70–99)
Potassium: 3.9 mEq/L (ref 3.5–5.1)
Sodium: 140 mEq/L (ref 135–145)
Total Bilirubin: 0.3 mg/dL (ref 0.3–1.2)
Total Protein: 6.4 g/dL (ref 6.0–8.3)

## 2012-09-11 LAB — CBC
HCT: 37.8 % (ref 36.0–46.0)
Hemoglobin: 12.6 g/dL (ref 12.0–15.0)
MCH: 25.5 pg — ABNORMAL LOW (ref 26.0–34.0)
MCHC: 33.3 g/dL (ref 30.0–36.0)
MCV: 76.5 fL — ABNORMAL LOW (ref 78.0–100.0)
Platelets: 184 10*3/uL (ref 150–400)
RBC: 4.94 MIL/uL (ref 3.87–5.11)
RDW: 15.1 % (ref 11.5–15.5)
WBC: 7 10*3/uL (ref 4.0–10.5)

## 2012-09-11 LAB — LIPID PANEL
Cholesterol: 185 mg/dL (ref 0–200)
HDL: 39 mg/dL — ABNORMAL LOW (ref >39)
LDL Cholesterol: 79 mg/dL (ref 0–99)
Total CHOL/HDL Ratio: 4.7 RATIO
Triglycerides: 334 mg/dL — ABNORMAL HIGH (ref <150)
VLDL: 67 mg/dL — ABNORMAL HIGH (ref 0–40)

## 2012-09-11 LAB — GLUCOSE, CAPILLARY
Glucose-Capillary: 136 mg/dL — ABNORMAL HIGH (ref 70–99)
Glucose-Capillary: 108 mg/dL — ABNORMAL HIGH (ref 70–99)
Glucose-Capillary: 137 mg/dL — ABNORMAL HIGH (ref 70–99)

## 2012-09-11 LAB — PRO B NATRIURETIC PEPTIDE: Pro B Natriuretic peptide (BNP): 303.3 pg/mL — ABNORMAL HIGH (ref 0–125)

## 2012-09-11 MED ORDER — TOPIRAMATE 100 MG PO TABS
200.0000 mg | ORAL_TABLET | Freq: Every day | ORAL | Status: DC
  Administered 2012-09-11: 200 mg via ORAL
  Filled 2012-09-11 (×2): qty 2

## 2012-09-11 NOTE — Consult Note (Signed)
Reason for Consult:Palpitations and near syncope  Referring Physician: Dr. Milda Smart Crawford-Fewell is an 62 y.o. female.   HPI: The patient is a 62 year old woman with a history of hypertrophic cardiomyopathy, who underwent myomectomy in 2012. She has preserved left ventricular function. She has left bundle branch block since her myomectomy. At baseline, her heart failure symptoms are well-controlled. She was in her usual state of health until yesterday when she presented with palpitations and associated near syncope. By the time she came to the emergency room, and a cardiac monitor was placed, her symptoms had resolved. She felt like her heart was beating irregularly. She had associated tightness in the chest. Minimal if any shortness of breath was present. She has a history of essentially normal coronary arteries at catheterization 2 years ago. In the hospital, she has had a very limited quality 2-D echo. Her windows were said to be poor. There was no obvious obstruction. Her left ventricular function was thought to be normal. The patient denies a history of peripheral edema. She has never had frank syncope. On cardiac monitoring, she has had sinus rhythm with PVCs.  PMH: Past Medical History  Diagnosis Date  . HYPERLIPIDEMIA 08/01/2006  . OBESITY 09/16/2006  . SICKLE CELL ANEMIA 08/01/2006  . ANXIETY 09/16/2006  . DEPRESSION 09/16/2006  . HYPERTENSION 08/01/2006  . 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 s/p septal myomectomy 4/12 at [facility] with Dr. Silvestre Mesi;  b. echo 5/12: EF 60-65%, LVOT peak 18 mmHg; grade 1 diast dysfxn, mild SAM (improved since myomectomy;  c. 03/2012 Echo: EF 60-65%, mod-sev basal septal asymm hypertrophy, basal septal HK, LVOT grad , Gr 1 DD, , SAM, Mild MR, nl RV, PASP .  Marland Kitchen BRADYCARDIA 10/13/2009  . ALLERGIC RHINITIS 12/07/2006  . ASTHMA 08/01/2006  . VENTRAL HERNIA 12/07/2006  . ANGIOEDEMA 03/07/2008  . ABDOMINAL WALL CONTUSION 02/18/2010  .  Wheezing 04/01/2010  . DIVERTICULOSIS, COLON, WITH HEMORRHAGE 04/06/2010  . RECTAL BLEEDING 04/06/2010  . CAD (coronary artery disease)     a. 01/2010 : Minimal plaque at cardiac catheterization - CFX 20%, EF 70%  . LBBB (left bundle branch block) 06/17/2010  . Chronic diastolic CHF (congestive heart failure) 04/06/2010    a. In setting of HOCM.  Marland Kitchen Impaired glucose tolerance 06/25/2010  . DM (diabetes mellitus) in pregnancy, delivered w/postpartum condition 08/30/2010  . Type II or unspecified type diabetes mellitus without mention of complication, uncontrolled 08/30/2010  . COPD (chronic obstructive pulmonary disease)     PSHX: Past Surgical History  Procedure Laterality Date  . Abdominal hysterectomy  1987  . Ventral hernia repair  06/2006  . Myomectomy      Septal    FAMHX: Family History  Problem Relation Age of Onset  . Cancer Daughter     colon cancer and bleeding disorder  . Hyperlipidemia Daughter   . Heart disease Daughter   . Heart disease Father   . Heart attack Father   . Heart attack Sister   . Hyperlipidemia Sister   . Heart disease Sister   . Hyperlipidemia Brother     Social History:  reports that she has quit smoking. She does not have any smokeless tobacco history on file. She reports that she does not drink alcohol or use illicit drugs.  Allergies:  Allergies  Allergen Reactions  . Ace Inhibitors     REACTION: angioedema right eye  . Aspirin     Bleeding   . Codeine  Hallucinations   . Soy Allergy Other (See Comments)    Bleeding & cramps    Medications: Reviewed  Dg Chest 2 View  09/10/2012   *RADIOLOGY REPORT*  Clinical Data: Chest pain.  CHEST - 2 VIEW  Comparison: 03/11/2008.  Findings: Post CABG.  Cardiomegaly.  Central pulmonary vascular prominence unchanged.  Linear atelectatic changes left lung.  No segmental consolidation or pneumothorax.  Tortuous aorta.  IMPRESSION: Post CABG.  Cardiomegaly.  Central pulmonary vascular prominence unchanged.   Linear atelectatic changes left lung.  No segmental consolidation or pneumothorax.  Tortuous aorta.   Original Report Authenticated By: Lacy Duverney, M.D.    ROS  As stated in the HPI and negative for all other systems.  Physical Exam  Vitals:Blood pressure 164/68, pulse 67, temperature 98.7 F (37.1 C), temperature source Oral, resp. rate 20, height 5\' 9"  (1.753 m), weight 169 lb 15.6 oz (77.1 kg), SpO2 98.00%.  Well appearing middle-aged woman, NAD HEENT: Unremarkable Neck:  6 cm JVD, no thyromegally Back:  No CVA tenderness Lungs:  Clear with no wheezes, rales, or rhonchi. HEART:  Regular rate rhythm, variable intensity systolic murmur heard best at the left lower sternal border murmurs, no rubs, no clicks Abd:  Flat, positive bowel sounds, no organomegally, no rebound, no guarding Ext:  2 plus pulses, no edema, no cyanosis, no clubbing Skin:  No rashes no nodules Neuro:  CN II through XII intact, motor grossly intact   ECG - sinus rhythm with left bundle branch block 2-D echo - reviewed Chest x-ray - reviewed Assessment/Plan: 1. near syncope 2. Palpitations 3. status post septal myomectomy secondary to obstructive hypertrophic cardiomyopathy Rec: At this point there are 2 issues to attempt to resolve. First is the question of whether she is at risk for sudden cardiac death. She does have frequent PVCs, but no documented sustained ventricular tachycardia. She also has left bundle branch block. I would recommend invasive EP study to see if she has inducible VT, and to measure her conduction intervals, specifically her HV interval. Second issue is whether medications might improve her symptoms. On this regard I would most likely recommend watchful waiting prior to initiation of any antiarrhythmic drug therapy. The sensation that her symptoms were irregular rather than regular would suggest either sinus rhythm and frequent PVCs, runs of nonsustained VT, or atrial fibrillation. If the  patient has inducible ventricular tachycardia, she would be at increased risk for sudden death, and ICD implantation would be recommended.  Lewayne Bunting, M.D.  Sharlot Gowda TaylorMD 09/11/2012, 7:25 PM

## 2012-09-12 ENCOUNTER — Encounter (HOSPITAL_COMMUNITY): Payer: Self-pay | Admitting: Nurse Practitioner

## 2012-09-12 ENCOUNTER — Encounter (HOSPITAL_COMMUNITY)
Admission: EM | Disposition: A | Discharge status: Home / Self Care | Payer: Self-pay | Source: Home / Self Care | Attending: Internal Medicine

## 2012-09-12 DIAGNOSIS — R002 Palpitations: Secondary | ICD-10-CM

## 2012-09-12 DIAGNOSIS — I472 Ventricular tachycardia: Secondary | ICD-10-CM

## 2012-09-12 DIAGNOSIS — I4729 Other ventricular tachycardia: Secondary | ICD-10-CM

## 2012-09-12 DIAGNOSIS — R0789 Other chest pain: Secondary | ICD-10-CM

## 2012-09-12 DIAGNOSIS — I493 Ventricular premature depolarization: Secondary | ICD-10-CM

## 2012-09-12 HISTORY — PX: ELECTROPHYSIOLOGY STUDY: SHX5467

## 2012-09-12 LAB — GLUCOSE, CAPILLARY
Glucose-Capillary: 157 mg/dL — ABNORMAL HIGH (ref 70–99)
Glucose-Capillary: 129 mg/dL — ABNORMAL HIGH (ref 70–99)
Glucose-Capillary: 93 mg/dL (ref 70–99)

## 2012-09-12 SURGERY — ELECTROPHYSIOLOGY STUDY
Anesthesia: LOCAL

## 2012-09-12 MED ORDER — METOPROLOL SUCCINATE ER 100 MG PO TB24: 100.0000 mg | ORAL_TABLET | Freq: Every morning | ORAL | Status: DC

## 2012-09-12 MED ORDER — LIDOCAINE-EPINEPHRINE 1 %-1:100000 IJ SOLN
INTRAMUSCULAR | Status: AC
  Filled 2012-09-12: qty 1

## 2012-09-12 NOTE — Interval H&P Note (Signed)
History and Physical Interval Note:  09/12/2012 2:23 PM  Stacey [name]  has presented today for surgery, with the diagnosis of vt  The various methods of treatment have been discussed with the patient and family. After consideration of risks, benefits and other options for treatment, the patient has consented to  Procedure(s): ELECTROPHYSIOLOGY STUDY (N/A) as a surgical intervention .  The patient's history has been reviewed, patient examined, no change in status, stable for surgery.  I have reviewed the patient's chart and labs.  Questions were answered to the patient's satisfaction.     Sherryl Manges  i have decided tp place lop recroder given the symptomatic palpiations of unclear mechanism  There are insufficienct indication for ICD

## 2012-09-12 NOTE — CV Procedure (Signed)
Pre op Dx HCM palpitations  Post op Dx    Procedure  Loop Recorder implantation  After routine prep and drape of the left parasternal area, a small incision was created. A Medtronic LINQ Reveal Loop Recorder  Serial Number  [id number] was inserted.    Steri-Strips were applied.  The patient tolerated the procedure without apparent complication.S

## 2012-09-12 NOTE — Discharge Summary (Signed)
Patient ID: Stacey [name],  MRN: [id number], DOB/AGE: [birth year] 62 y.o.  Admit date: 09/10/2012 Discharge date: 09/12/2012  Primary Care Provider: Cathlean Cower Primary Cardiologist: J. Hochrein, MD / G. Lovena Le, MD (EP)  Discharge Diagnoses Principal Problem:   Midsternal chest pain  **s/p cath this admission revealing normal coronary arteries.  Active Problems:   Hypertrophic obstructive cardiomyopathy(425.11)  **normal EF without appreciable gradient by echo this admission.   Palpitations  **s/p implantable loop recorder this admission.   Frequent PVCs   HYPERTENSION   Type II or unspecified type diabetes mellitus without mention of complication, uncontrolled   HYPERLIPIDEMIA   Status post myomectomy   LBBB (left bundle branch block)  Allergies Allergies  Allergen Reactions  . Ace Inhibitors     REACTION: angioedema right eye  . Aspirin     Bleeding   . Codeine     Hallucinations   . Soy Allergy Other (See Comments)    Bleeding & cramps   Procedures  Cardiac Catheterization 8.18.2014  Hemodynamic Findings: Central aortic pressure: 129/77 Left ventricular pressure: 133/8/16  Angiographic Findings:  Left main:  No obstructive disease.  Left Anterior Descending Artery: Large caliber vessel that courses to the apex. Thre is a large diagonal branch and several small caliber diagonal branches. No obstructive disease.   Circumflex Artery: Large caliber vessel with two large caliber OM branches and a large left posterolateral branch. No obstructive disease.   Right Coronary Artery: Small non-dominant vessel with no obstructive disease.  Left Ventricular Angiogram: LVEF=65%  Impression: 1. No angiographic evidence of CAD 2. Preserved LV systolic function _____________  2D Echocardiogram 8.18.2014  Study Conclusions  - Left ventricle: Technically very limited study. There is   no doppler data to suggest an LVOT gradient. Futher   doppler assessment  could be done to get more data, but I   doubt LVOT obstruction. There is question of a small   target seen in LVOT that may be attached to the septum.   This could be a membrane, but it is aborderline call.   Suggest TEE if this question is clinically significant.   The cavity size was normal. Wall thickness was increased   in a pattern of moderate LVH. The estimated ejection   fraction was 60%. Doppler parameters are consistent with   abnormal left ventricular relaxation (grade 1 diastolic   dysfunction). - Mitral valve: Mildly calcified annulus. - Left atrium: The atrium was moderately to severely   dilated. - Right ventricle: The cavity size was normal. Systolic   function was mildly reduced. _____________  Implantable Loop Recorder 8.20.2014  A Medtronic LINQ Reveal Loop Recorder  Serial Number  [id number]  _____________   History of Present Illness  62 y/o female with h/o HOCM s/p septal myomectomy at [facility] in 2012.  She was in her usual state of health until roughly one week prior to admission, when she began to experience intermittent palpitations. Approximately 2 days prior to admission, palpitations and fluttering worsened and became associated with lightheadedness, malaise, and weakness. On the morning of admission, she became more lightheaded, dizzy, and weak, with more frequent palpitations. She also noted moderately severe substernal chest heaviness radiating into her back and jaw, associated with mild dyspnea. She subsequently drove herself to the emergency department and became or presyncopal driving.in the ED, ECG showed frequent PVCs. A point-of-care troponin was elevated at 0.11 and she continues to complain of chest heaviness. She was treated with aspirin and heparin and decision was  made to pursue diagnostic catheterization and admission.  Hospital Course  Following admission, patient underwent diagnostic cardiac catheterization revealing normal coronary arteries. It  was not felt that she required further ischemic evaluation and in fact subsequent troponin was normal. She continued to have frequent PVCs and given her history of presyncope and hypertrophic card myopathy, electrophysiology was consulted. Initially electrophysiologic study was considered however after further review, decision was made to pursue implantable Loop recorder placement. This took place this morning and patient tolerated well. She'll be discharged home this evening in good condition.  Discharge Vitals Blood pressure 104/68, pulse 69, temperature 98 F (36.7 C), temperature source Oral, resp. rate 17, height '5\' 9"'$  (1.753 m), weight 169 lb 15.6 oz (77.1 kg), SpO2 95.00%.  Filed Weights   09/10/12 1028 09/11/12 0400 09/11/12 0600  Weight: 170 lb (77.111 kg) 171 lb 4.8 oz (77.7 kg) 169 lb 15.6 oz (77.1 kg)   Labs  CBC  Recent Labs  09/10/12 1055 09/11/12 0615  WBC 6.8 7.0  NEUTROABS 4.8  --   HGB 13.4 12.6  HCT 39.7 37.8  MCV 75.5* 76.5*  PLT 213 [id number]   Basic Metabolic Panel  Recent Labs  09/10/12 1055 09/10/12 1854 09/11/12 0615  NA 140  --  140  K 3.5  --  3.9  CL 106  --  108  CO2 23  --  23  GLUCOSE 131*  --  140*  BUN 14  --  12  CREATININE 0.64  --  0.69  CALCIUM 9.8  --  9.3  MG  --  2.0  --    Liver Function Tests  Recent Labs  09/10/12 1055 09/11/12 0615  AST 18 16  ALT 22 20  ALKPHOS 149* 136*  BILITOT 0.2* 0.3  PROT 7.5 6.4  ALBUMIN 3.9 3.5   Cardiac Enzymes  Recent Labs  09/10/12 2000  TROPONINI <0.30   Fasting Lipid Panel  Recent Labs  09/11/12 0615  CHOL 185  HDL 39*  LDLCALC 79  TRIG 334*  CHOLHDL 4.7   Disposition  Pt is being discharged home today in good condition.  Follow-up Plans & Appointments  Follow-up Information   Follow up with [facility] On 09/26/2012. (12:00 PM)    Contact information:   [place], [place] [address] [phone]      Follow up with Minus Breeding, MD. (3-4 wks.)    Specialty:  Cardiology   Contact information:   [id number] [place], [place] [address] [phone]       Follow up with Cristopher Peru, MD In 3 months.   Specialty:  Cardiology   Contact information:   [id number] [place] [address] [phone]       Follow up with Cathlean Cower, MD. (as scheduled.)    Specialties:  Internal Medicine, Radiology   Contact information:   [place]  [address] [phone]      Discharge Medications    Medication List         atorvastatin 10 MG tablet  Commonly known as:  LIPITOR  Take 1 tablet (10 mg total) by mouth daily.     cetirizine 10 MG tablet  Commonly known as:  ZYRTEC  Take 10 mg by mouth daily.     fluticasone 50 MCG/ACT nasal spray  Commonly known as:  FLONASE  Place 2 sprays into the nose daily.  irbesartan 300 MG tablet  Commonly known as:  AVAPRO  Take 1 tablet (300 mg total) by mouth at bedtime.     metoprolol succinate 100 MG 24 hr tablet  Commonly known as:  TOPROL-XL  Take 100 mg by mouth every morning. Take with or immediately following a meal.     oxcarbazepine 600 MG tablet  Commonly known as:  TRILEPTAL  Take 600 mg by mouth 2 (two) times daily.     potassium chloride SA 20 MEQ tablet  Commonly known as:  K-DUR,KLOR-CON  Take 20 mEq by mouth daily as needed. For low potassium     topiramate 200 MG tablet  Commonly known as:  TOPAMAX  Take 200 mg by mouth at bedtime.      Outstanding Labs/Studies  none  Duration of Discharge Encounter   Greater than 30 minutes including physician time.  Signed, Murray Hodgkins NP 09/12/2012, 5:27 PM

## 2012-09-12 NOTE — Progress Notes (Signed)
Patient Name: Stacey [name]      SUBJECTIVE: without complaint History reviewed  Past Medical History  Diagnosis Date  . HYPERLIPIDEMIA 08/01/2006  . OBESITY 09/16/2006  . SICKLE CELL ANEMIA 08/01/2006  . ANXIETY 09/16/2006  . DEPRESSION 09/16/2006  . HYPERTENSION 08/01/2006  . 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 s/p septal myomectomy 4/12 at [facility] with Dr. Glower;  b. echo 5/12: EF 60-65%, LVOT peak 18 mmHg; grade 1 diast dysfxn, mild SAM (improved since myomectomy;  c. 03/2012 Echo: EF 60-65%, mod-sev basal septal asymm hypertrophy, basal septal HK, LVOT grad 68mmHg, Gr 1 DD, , SAM, Mild MR, nl RV, PASP 35mmHg.  . BRADYCARDIA 10/13/2009  . ALLERGIC RHINITIS 12/07/2006  . ASTHMA 08/01/2006  . VENTRAL HERNIA 12/07/2006  . ANGIOEDEMA 03/07/2008  . ABDOMINAL WALL CONTUSION 02/18/2010  . Wheezing 04/01/2010  . DIVERTICULOSIS, COLON, WITH HEMORRHAGE 04/06/2010  . RECTAL BLEEDING 04/06/2010  . CAD (coronary artery disease)     a. 01/2010 : Minimal plaque at cardiac catheterization - CFX 20%, EF 70%  . LBBB (left bundle branch block) 06/17/2010  . Chronic diastolic CHF (congestive heart failure) 04/06/2010    a. In setting of HOCM.  . Impaired glucose tolerance 06/25/2010  . DM (diabetes mellitus) in pregnancy, delivered w/postpartum condition 08/30/2010  . Type II or unspecified type diabetes mellitus without mention of complication, uncontrolled 08/30/2010  . COPD (chronic obstructive pulmonary disease)     Scheduled Meds:  Scheduled Meds: . aspirin EC  81 mg Oral Daily  . atorvastatin  10 mg Oral Daily  . fluticasone  2 spray Each Nare Daily  . insulin aspart  0-15 Units Subcutaneous TID WC  . irbesartan  300 mg Oral QHS  . loratadine  10 mg Oral Daily  . metoprolol succinate  100 mg Oral Daily  . OXcarbazepine  600 mg Oral BID  . pantoprazole  40 mg Oral Q0600  . sodium chloride  3 mL Intravenous Q12H  . topiramate  200 mg Oral QHS   Continuous Infusions:    PHYSICAL EXAM Filed Vitals:   09/11/12 2100 09/11/12 2349 09/12/12 0408 09/12/12 0809  BP: 158/67 180/74 143/59 167/74  Pulse:  70 47   Temp: 98.4 F (36.9 C) 98.2 F (36.8 C) 98.4 F (36.9 C) 98.2 F (36.8 C)  TempSrc: Oral Oral Oral Oral  Resp: 18 12 15 17  Height:      Weight:      SpO2: 94% 99% 95% 96%    Well developed and nourished in no acute distress HENT normal Neck supple with JVP-flat Clear Regular rate and rhythm, no murmurs or gallops Abd-soft with active BS No Clubbing cyanosis edema Skin-warm and dry A & Oriented  Grossly normal sensory and motor function   TELEMETRY: Reviewed telemetry pt in  Sinus with bigeminy and trigeminy:    Intake/Output Summary (Last 24 hours) at 09/12/12 0842 Last data filed at 09/11/12 1600  Gross per 24 hour  Intake    840 ml  Output      0 ml  Net    840 ml    LABS: Basic Metabolic Panel:  Recent Labs Lab 09/10/12 1055 09/10/12 1854 09/11/12 0615  NA 140  --  140  K 3.5  --  3.9  CL 106  --  108  CO2 23  --  23  GLUCOSE 131*  --  140*  BUN 14  --  12  CREATININE 0.64  --  0.69    CALCIUM 9.8  --  9.3  MG  --  2.0  --    Cardiac Enzymes:  Recent Labs  09/10/12 2000  TROPONINI <0.30   CBC:  Recent Labs Lab 09/10/12 1055 09/11/12 0615  WBC 6.8 7.0  NEUTROABS 4.8  --   HGB 13.4 12.6  HCT 39.7 37.8  MCV 75.5* 76.5*  PLT 213 184   PROTIME: No results found for this basename: LABPROT, INR,  in the last 72 hours Liver Function Tests:  Recent Labs  09/10/12 1055 09/11/12 0615  AST 18 16  ALT 22 20  ALKPHOS 149* 136*  BILITOT 0.2* 0.3  PROT 7.5 6.4  ALBUMIN 3.9 3.5   No results found for this basename: LIPASE, AMYLASE,  in the last 72 hours BNP: BNP (last 3 results)  Recent Labs  09/11/12 0615  PROBNP 303.3*   D-Dimer: No results found for this basename: DDIMER,  in the last 72 hours Hemoglobin A1C: No results found for this basename: HGBA1C,  in the last 72 hours Fasting  Lipid Panel:  Recent Labs  09/11/12 0615  CHOL 185  HDL 39*  LDLCALC 79  TRIG 334*  CHOLHDL 4.7    ASSESSMENT AND PLAN:  Active Problems:   * No active hospital problems. *  The patient had syncope prior to her myectomy in the context of HCM. It was felt to be related to the outflow tract gradient. Some of her symptoms on Monday reminiscent of the prodrome associated with her syncope.  Review guidelines last night suggests that EP testing for risk stratification is not expected to be helpful. I would like to review her case with Dr.Wang from [facility] who saw her for HCM. The question that I have is to what degree does myectomy change palpitations or syncope. I am of data that myectomy decreases risk of ICD shocks and one could infer from that it reduces the risk of sudden death and as such pre Myectomy syncope may have little imlication At that point i owuld use loop recorder Signed, Keldon Lassen MD  09/12/2012    

## 2012-09-12 NOTE — H&P (View-Only) (Signed)
Patient Name: Stacey [name]      SUBJECTIVE: without complaint History reviewed  Past Medical History  Diagnosis Date  . HYPERLIPIDEMIA 08/01/2006  . OBESITY 09/16/2006  . SICKLE CELL ANEMIA 08/01/2006  . ANXIETY 09/16/2006  . DEPRESSION 09/16/2006  . HYPERTENSION 08/01/2006  . 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 s/p septal myomectomy 4/12 at [facility] with Dr. Silvestre Mesi;  b. echo 5/12: EF 60-65%, LVOT peak 18 mmHg; grade 1 diast dysfxn, mild SAM (improved since myomectomy;  c. 03/2012 Echo: EF 60-65%, mod-sev basal septal asymm hypertrophy, basal septal HK, LVOT grad , Gr 1 DD, , SAM, Mild MR, nl RV, PASP .  Marland Kitchen BRADYCARDIA 10/13/2009  . ALLERGIC RHINITIS 12/07/2006  . ASTHMA 08/01/2006  . VENTRAL HERNIA 12/07/2006  . ANGIOEDEMA 03/07/2008  . ABDOMINAL WALL CONTUSION 02/18/2010  . Wheezing 04/01/2010  . DIVERTICULOSIS, COLON, WITH HEMORRHAGE 04/06/2010  . RECTAL BLEEDING 04/06/2010  . CAD (coronary artery disease)     a. 01/2010 : Minimal plaque at cardiac catheterization - CFX 20%, EF 70%  . LBBB (left bundle branch block) 06/17/2010  . Chronic diastolic CHF (congestive heart failure) 04/06/2010    a. In setting of HOCM.  Marland Kitchen Impaired glucose tolerance 06/25/2010  . DM (diabetes mellitus) in pregnancy, delivered w/postpartum condition 08/30/2010  . Type II or unspecified type diabetes mellitus without mention of complication, uncontrolled 08/30/2010  . COPD (chronic obstructive pulmonary disease)     Scheduled Meds:  Scheduled Meds: . aspirin EC  81 mg Oral Daily  . atorvastatin  10 mg Oral Daily  . fluticasone  2 spray Each Nare Daily  . insulin aspart  0-15 Units Subcutaneous TID WC  . irbesartan  300 mg Oral QHS  . loratadine  10 mg Oral Daily  . metoprolol succinate  100 mg Oral Daily  . OXcarbazepine  600 mg Oral BID  . pantoprazole  40 mg Oral Q0600  . sodium chloride  3 mL Intravenous Q12H  . topiramate  200 mg Oral QHS   Continuous Infusions:    PHYSICAL EXAM Filed Vitals:   09/11/12 2100 09/11/12 2349 09/12/12 0408 09/12/12 0809  BP: 158/67 180/74 143/59 167/74  Pulse:  70 47   Temp: 98.4 F (36.9 C) 98.2 F (36.8 C) 98.4 F (36.9 C) 98.2 F (36.8 C)  TempSrc: Oral Oral Oral Oral  Resp: 18 12 15 17   Height:      Weight:      SpO2: 94% 99% 95% 96%    Well developed and nourished in no acute distress HENT normal Neck supple with JVP-flat Clear Regular rate and rhythm, no murmurs or gallops Abd-soft with active BS No Clubbing cyanosis edema Skin-warm and dry A & Oriented  Grossly normal sensory and motor function   TELEMETRY: Reviewed telemetry pt in  Sinus with bigeminy and trigeminy:    Intake/Output Summary (Last 24 hours) at 09/12/12 0842 Last data filed at 09/11/12 1600  Gross per 24 hour  Intake    840 ml  Output      0 ml  Net    840 ml    LABS: Basic Metabolic Panel:  Recent Labs Lab 09/10/12 1055 09/10/12 1854 09/11/12 0615  NA 140  --  140  K 3.5  --  3.9  CL 106  --  108  CO2 23  --  23  GLUCOSE 131*  --  140*  BUN 14  --  12  CREATININE 0.64  --  0.69  CALCIUM 9.8  --  9.3  MG  --  2.0  --    Cardiac Enzymes:  Recent Labs  09/10/12 2000  TROPONINI <0.30   CBC:  Recent Labs Lab 09/10/12 1055 09/11/12 0615  WBC 6.8 7.0  NEUTROABS 4.8  --   HGB 13.4 12.6  HCT 39.7 37.8  MCV 75.5* 76.5*  PLT 213 184   PROTIME: No results found for this basename: LABPROT, INR,  in the last 72 hours Liver Function Tests:  Recent Labs  09/10/12 1055 09/11/12 0615  AST 18 16  ALT 22 20  ALKPHOS 149* 136*  BILITOT 0.2* 0.3  PROT 7.5 6.4  ALBUMIN 3.9 3.5   No results found for this basename: LIPASE, AMYLASE,  in the last 72 hours BNP: BNP (last 3 results)  Recent Labs  09/11/12 0615  PROBNP 303.3*   D-Dimer: No results found for this basename: DDIMER,  in the last 72 hours Hemoglobin A1C: No results found for this basename: HGBA1C,  in the last 72 hours Fasting  Lipid Panel:  Recent Labs  09/11/12 0615  CHOL 185  HDL 39*  LDLCALC 79  TRIG 161*  CHOLHDL 4.7    ASSESSMENT AND PLAN:  Active Problems:   * No active hospital problems. *  The patient had syncope prior to her myectomy in the context of HCM. It was felt to be related to the outflow tract gradient. Some of her symptoms on Monday reminiscent of the prodrome associated with her syncope.  Review guidelines last night suggests that EP testing for risk stratification is not expected to be helpful. I would like to review her case with Dr.Wang from [facility] who saw her for HCM. The question that I have is to what degree does myectomy change palpitations or syncope. I am of data that myectomy decreases risk of ICD shocks and one could infer from that it reduces the risk of sudden death and as such pre Myectomy syncope may have little imlication At that point i owuld use loop recorder Signed, Sherryl Manges MD  09/12/2012

## 2012-09-12 NOTE — Progress Notes (Signed)
Pt given d/c' instructions, verbalizes understanding. PIVs d/c'd catheter tip intact. Will d/c to car via WC.

## 2012-09-13 ENCOUNTER — Telehealth: Payer: Self-pay | Admitting: Cardiology

## 2012-09-13 NOTE — Telephone Encounter (Signed)
Left message - unsure of why pt would need HH but would be glad to discuss if she wants to call back.

## 2012-09-13 NOTE — Telephone Encounter (Signed)
New Problem   Automatic approval for home health// wanted to know if he is interested in allowing her to have this service.. please call back to discuss,

## 2012-09-14 ENCOUNTER — Encounter: Payer: Self-pay | Admitting: *Deleted

## 2012-09-14 NOTE — Telephone Encounter (Signed)
Letter completed to return to work without restrictions.  Letter left at front desk for pt pick up.

## 2012-09-14 NOTE — Telephone Encounter (Signed)
New problem   Pt need a return to work note for Monday 09/17/12. Please call pt so she can come and pick note up.

## 2012-09-21 ENCOUNTER — Encounter: Payer: Self-pay | Admitting: Internal Medicine

## 2012-09-26 ENCOUNTER — Ambulatory Visit (INDEPENDENT_AMBULATORY_CARE_PROVIDER_SITE_OTHER): Payer: BC Managed Care – PPO | Admitting: *Deleted

## 2012-09-26 DIAGNOSIS — I4949 Other premature depolarization: Secondary | ICD-10-CM

## 2012-09-26 DIAGNOSIS — I493 Ventricular premature depolarization: Secondary | ICD-10-CM

## 2012-09-26 DIAGNOSIS — R002 Palpitations: Secondary | ICD-10-CM

## 2012-09-26 LAB — PACEMAKER DEVICE OBSERVATION

## 2012-09-26 NOTE — Progress Notes (Signed)
Pt seen in device clinic for follow up of recently implanted ILR.   Wound well healed.  No redness, swelling, or edema.  Steri-strips removed prior to arrival.   1 tachy episode recorded---noise..? 1 unrelated symptomatic episode---bigeminal PVCs. See PaceArt for full details.  Pt notices palpitations.  Pt to follow up with Dr. Graciela Husbands 12/18/12 @ 4:00.   Stacey [name] 09/26/2012 12:30 PM

## 2012-09-27 ENCOUNTER — Telehealth: Payer: Self-pay | Admitting: Internal Medicine

## 2012-09-27 NOTE — Telephone Encounter (Signed)
Walk In Pt Form" FMLA Dropped Off" Sending Interoffice to [facility] 09/27/12/KM

## 2012-10-01 ENCOUNTER — Encounter: Payer: Self-pay | Admitting: Internal Medicine

## 2012-10-01 ENCOUNTER — Ambulatory Visit (INDEPENDENT_AMBULATORY_CARE_PROVIDER_SITE_OTHER): Payer: BC Managed Care – PPO | Admitting: Internal Medicine

## 2012-10-01 VITALS — BP 160/90 | HR 77 | Temp 99.3°F | Wt 170.6 lb

## 2012-10-01 DIAGNOSIS — H6690 Otitis media, unspecified, unspecified ear: Secondary | ICD-10-CM

## 2012-10-01 DIAGNOSIS — H6692 Otitis media, unspecified, left ear: Secondary | ICD-10-CM

## 2012-10-01 MED ORDER — AMOXICILLIN 500 MG PO CAPS: 500.0000 mg | ORAL_CAPSULE | Freq: Three times a day (TID) | ORAL | Status: DC

## 2012-10-01 NOTE — Progress Notes (Signed)
HPI: Pt presents with 3 days onset of congestion, ear pain, and sore throat. Pt states she is having fever and chills, some clear nasal discharge. Pt states the pain is worse on right side with a full feeling in ears.  Pt tried taking daily Zyrtec, corisidane,  and Benadryl OTC for possible allergy related symptoms without any relief. Pt rates pain 6/10.   Past Medical History  Diagnosis Date  . HYPERLIPIDEMIA 08/01/2006  . OBESITY 09/16/2006  . SICKLE CELL ANEMIA 08/01/2006  . ANXIETY 09/16/2006  . DEPRESSION 09/16/2006  . HYPERTENSION 08/01/2006  . 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 s/p septal myomectomy 4/12 at [facility] with Dr. Evelina Dun;  b. echo 5/12: EF 60-65%, LVOT peak 18 mmHg; grade 1 diast dysfxn, mild SAM (improved since myomectomy;  c. 03/2012 Echo: EF 60-65%, mod-sev basal septal asymm hypertrophy, basal septal HK, LVOT grad 44mHg, Gr 1 DD, , SAM, Mild MR, nl RV, PASP 310mg; 08/2012 Echo: technically difficult, doubt LVOT obstruction, EF 60%, Gr 1 DD, mod-sev dil LA.  . BRADYCARDIA 10/13/2009  . ALLERGIC RHINITIS 12/07/2006  . ASTHMA 08/01/2006  . VENTRAL HERNIA 12/07/2006  . ANGIOEDEMA 03/07/2008  . ABDOMINAL WALL CONTUSION 02/18/2010  . Wheezing 04/01/2010  . DIVERTICULOSIS, COLON, WITH HEMORRHAGE 04/06/2010  . RECTAL BLEEDING 04/06/2010  . CAD (coronary artery disease)     a. 01/2010 : Minimal plaque at cardiac catheterization - CFX 20%, EF 70%;  b. 08/2012 Cath: Nl Cors, EF 65%.  . Marland KitchenBBB (left bundle branch block) 06/17/2010  . Chronic diastolic CHF (congestive heart failure) 04/06/2010    a. In setting of HOCM.  . Marland Kitchenmpaired glucose tolerance 06/25/2010  . DM (diabetes mellitus) in pregnancy, delivered w/postpartum condition 08/30/2010  . Type II or unspecified type diabetes mellitus without mention of complication, uncontrolled 08/30/2010  . COPD (chronic obstructive pulmonary disease)   . Frequent PVCs     a. Medtronic LINQ Reveal Loop Recorder  Serial Number  [id number]      Current Outpatient Prescriptions  Medication Sig Dispense Refill  . atorvastatin (LIPITOR) 10 MG tablet Take 1 tablet (10 mg total) by mouth daily.  90 tablet  3  . cetirizine (ZYRTEC) 10 MG tablet Take 10 mg by mouth daily.        . fluticasone (FLONASE) 50 MCG/ACT nasal spray Place 2 sprays into the nose daily.  16 g  6  . irbesartan (AVAPRO) 300 MG tablet Take 1 tablet (300 mg total) by mouth at bedtime.  90 tablet  3  . metoprolol succinate (TOPROL-XL) 100 MG 24 hr tablet Take 100 mg by mouth every morning. Take with or immediately following a meal.      . oxcarbazepine (TRILEPTAL) 600 MG tablet Take 600 mg by mouth 2 (two) times daily.      . potassium chloride SA (K-DUR,KLOR-CON) 20 MEQ tablet Take 20 mEq by mouth daily as needed. For low potassium      . topiramate (TOPAMAX) 200 MG tablet Take 200 mg by mouth at bedtime.      . Marland Kitchenmoxicillin (AMOXIL) 500 MG capsule Take 1 capsule (500 mg total) by mouth 3 (three) times daily.  30 capsule  0   No current facility-administered medications for this visit.    Allergies  Allergen Reactions  . Ace Inhibitors     REACTION: angioedema right eye  . Aspirin     Bleeding   . Codeine     Hallucinations   . Soy Allergy Other (See Comments)  Bleeding & cramps    Family History  Problem Relation Age of Onset  . Cancer Daughter     colon cancer and bleeding disorder  . Hyperlipidemia Daughter   . Heart disease Daughter   . Heart disease Father   . Heart attack Father   . Heart attack Sister   . Hyperlipidemia Sister   . Heart disease Sister   . Hyperlipidemia Brother     History   Social History  . Marital Status: Married    Spouse Name: N/A    Number of Children: N/A  . Years of Education: N/A   Occupational History  . Not on file.   Social History Main Topics  . Smoking status: Former Research scientist (life sciences)  . Smokeless tobacco: Not on file     Comment: Quit smoking 2001. Smoked on and off for 20 years. Smoked 2-3 cigars daily   . Alcohol Use: No  . Drug Use: No  . Sexual Activity: Not on file   Other Topics Concern  . Not on file   Social History Narrative   Lives in [place] with husband.  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    ROS:  Constitutional: Endorses fever, fatigue, headache. Denies weight loss or malaise.  HEENT: Endorses ear pain, runny nose, and sore throat. Denies eye pain, eye redness, ear pain, ringing in the ears, nasal congestion, or bloody nose. Respiratory: Denies difficulty breathing, shortness of breath, cough or sputum production.   Cardiovascular: Denies chest pain, chest tightness, palpitations or swelling in the hands or feet.  Gastrointestinal: Denies abdominal pain, bloating, constipation, diarrhea or blood in the stool. .   No other specific complaints in a complete review of systems (except as listed in HPI above).  PE:  BP 160/90  Pulse 77  Temp(Src) 99.3 F (37.4 C) (Oral)  Wt 170 lb 9.6 oz (77.384 kg)  BMI 25.18 kg/m2  SpO2 97% Wt Readings from Last 3 Encounters:  10/01/12 170 lb 9.6 oz (77.384 kg)  09/11/12 169 lb 15.6 oz (77.1 kg)  09/11/12 169 lb 15.6 oz (77.1 kg)    General: Appears their stated age, well developed, well nourished in NAD. HEENT: Head: normal shape and size; Eyes: sclera white, no icterus, conjunctiva pink, PERRLA and EOMs intact; Ears: bilaterally injected, positive bilateral effusion, serous fluid, dull light reflex; Nose: mucosa pink and moist, septum midline; Throat/Mouth: Teeth present, mucosa pink and moist,  Throat erythmetous, no lesions noted.  Neck:  Neck supple, trachea midline. Anterior cervical lymphoedema present noted, right greater than left. Cardiovascular: Normal rate and rhythm. S1,S2 noted.  No murmur, rubs or gallops noted. No JVD or BLE edema. No carotid bruits noted. Pulmonary/Chest: Normal effort and positive vesicular breath sounds. No respiratory distress. No wheezes, rales or ronchi noted.       Assessment and Plan: Acute Otitis media Prescribed Amoxicillin '500mg'$  PO TID for 10 days Encouraged fluids OTC Ibuprofen or Tylenol for pain/fever Follow up in 3-5 days if symptoms not improved  Krisna Omar S, Student-NP

## 2012-10-01 NOTE — Progress Notes (Signed)
HPI  Pt presents to the clinic today with c/o headache, fatigue, ear pain and sore throat x 3 days. She has taken ibuprofen, coricidan, zyrtec and bendaryl. These were ineffective. Her symptoms seem to be getting worse. She has a history of allergies, but no breathing problems. She has had sick contacts.  Review of Systems      Past Medical History  Diagnosis Date  . HYPERLIPIDEMIA 08/01/2006  . OBESITY 09/16/2006  . SICKLE CELL ANEMIA 08/01/2006  . ANXIETY 09/16/2006  . DEPRESSION 09/16/2006  . HYPERTENSION 08/01/2006  . 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 s/p septal myomectomy 4/12 at [facility] with Dr. Silvestre Mesi;  b. echo 5/12: EF 60-65%, LVOT peak 18 mmHg; grade 1 diast dysfxn, mild SAM (improved since myomectomy;  c. 03/2012 Echo: EF 60-65%, mod-sev basal septal asymm hypertrophy, basal septal HK, LVOT grad , Gr 1 DD, , SAM, Mild MR, nl RV, PASP ; 08/2012 Echo: technically difficult, doubt LVOT obstruction, EF 60%, Gr 1 DD, mod-sev dil LA.  . BRADYCARDIA 10/13/2009  . ALLERGIC RHINITIS 12/07/2006  . ASTHMA 08/01/2006  . VENTRAL HERNIA 12/07/2006  . ANGIOEDEMA 03/07/2008  . ABDOMINAL WALL CONTUSION 02/18/2010  . Wheezing 04/01/2010  . DIVERTICULOSIS, COLON, WITH HEMORRHAGE 04/06/2010  . RECTAL BLEEDING 04/06/2010  . CAD (coronary artery disease)     a. 01/2010 : Minimal plaque at cardiac catheterization - CFX 20%, EF 70%;  b. 08/2012 Cath: Nl Cors, EF 65%.  Marland Kitchen LBBB (left bundle branch block) 06/17/2010  . Chronic diastolic CHF (congestive heart failure) 04/06/2010    a. In setting of HOCM.  Marland Kitchen Impaired glucose tolerance 06/25/2010  . DM (diabetes mellitus) in pregnancy, delivered w/postpartum condition 08/30/2010  . Type II or unspecified type diabetes mellitus without mention of complication, uncontrolled 08/30/2010  . COPD (chronic obstructive pulmonary disease)   . Frequent PVCs     a. Medtronic LINQ Reveal Loop Recorder  Serial Number  [id number]     Family History  Problem  Relation Age of Onset  . Cancer Daughter     colon cancer and bleeding disorder  . Hyperlipidemia Daughter   . Heart disease Daughter   . Heart disease Father   . Heart attack Father   . Heart attack Sister   . Hyperlipidemia Sister   . Heart disease Sister   . Hyperlipidemia Brother     History   Social History  . Marital Status: Married    Spouse Name: N/A    Number of Children: N/A  . Years of Education: N/A   Occupational History  . Not on file.   Social History Main Topics  . Smoking status: Former Games developer  . Smokeless tobacco: Not on file     Comment: Quit smoking 2001. Smoked on and off for 20 years. Smoked 2-3 cigars daily  . Alcohol Use: No  . Drug Use: No  . Sexual Activity: Not on file   Other Topics Concern  . Not on file   Social History Narrative   Lives in [place] with husband.  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    Allergies  Allergen Reactions  . Ace Inhibitors     REACTION: angioedema right eye  . Aspirin     Bleeding   . Codeine     Hallucinations   . Soy Allergy Other (See Comments)    Bleeding & cramps     Constitutional: Positive headache, fatigue and fever. Denies abrupt weight changes.  HEENT:  Positive sore throat and ear pain. Denies eye redness, eye pain, pressure behind the eyes, facial pain, nasal congestion, ear pain, ringing in the ears, wax buildup, runny nose or bloody nose. Respiratory: Positive cough. Denies difficulty breathing or shortness of breath.  Cardiovascular: Denies chest pain, chest tightness, palpitations or swelling in the hands or feet.   No other specific complaints in a complete review of systems (except as listed in HPI above).  Objective:   BP 160/90  Pulse 77  Temp(Src) 99.3 F (37.4 C) (Oral)  Wt 170 lb 9.6 oz (77.384 kg)  BMI 25.18 kg/m2  SpO2 97% Wt Readings from Last 3 Encounters:  10/01/12 170 lb 9.6 oz (77.384 kg)  09/11/12 169 lb 15.6 oz (77.1 kg)  09/11/12 169  lb 15.6 oz (77.1 kg)     General: Appears his stated age, well developed, well nourished in NAD. HEENT: Head: normal shape and size; Eyes: sclera white, no icterus, conjunctiva pink, PERRLA and EOMs intact; Ears: Tm's red, distorted light reflex + effusions; Nose: mucosa pink and moist, septum midline; Throat/Mouth: + PND. Teeth present, mucosa erythematous and moist, no exudate noted, no lesions or ulcerations noted.  Neck: Mild cervical lymphadenopathy. Neck supple, trachea midline. No massses, lumps or thyromegaly present.  Cardiovascular: Normal rate and rhythm. S1,S2 noted.  No murmur, rubs or gallops noted. No JVD or BLE edema. No carotid bruits noted. Pulmonary/Chest: Normal effort and positive vesicular breath sounds. No respiratory distress. No wheezes, rales or ronchi noted.      Assessment & Plan:   Bilateral Otitis media, new onset   Get some rest and drink plenty of water Do salt water gargles for the sore throat eRx for Amoxicillin 500 mg TID x 10 days  RTC as needed or if symptoms persist.

## 2012-10-01 NOTE — Patient Instructions (Signed)

## 2012-10-16 ENCOUNTER — Encounter: Payer: Self-pay | Admitting: Internal Medicine

## 2012-10-17 ENCOUNTER — Ambulatory Visit (INDEPENDENT_AMBULATORY_CARE_PROVIDER_SITE_OTHER): Payer: BC Managed Care – PPO | Admitting: *Deleted

## 2012-10-17 DIAGNOSIS — R002 Palpitations: Secondary | ICD-10-CM

## 2012-10-23 LAB — PACEMAKER DEVICE OBSERVATION

## 2012-10-26 ENCOUNTER — Encounter: Payer: BC Managed Care – PPO | Admitting: Cardiology

## 2012-11-14 ENCOUNTER — Encounter: Payer: Self-pay | Admitting: *Deleted

## 2012-11-19 ENCOUNTER — Ambulatory Visit (INDEPENDENT_AMBULATORY_CARE_PROVIDER_SITE_OTHER): Payer: BC Managed Care – PPO | Admitting: *Deleted

## 2012-11-19 DIAGNOSIS — R55 Syncope and collapse: Secondary | ICD-10-CM

## 2012-11-23 ENCOUNTER — Encounter: Payer: Self-pay | Admitting: Family

## 2012-11-26 ENCOUNTER — Encounter: Payer: Self-pay | Admitting: Family

## 2012-11-26 ENCOUNTER — Ambulatory Visit (HOSPITAL_COMMUNITY)
Admission: RE | Admit: 2012-11-26 | Discharge: 2012-11-26 | Disposition: A | Discharge status: Home / Self Care | Payer: BC Managed Care – PPO | Source: Ambulatory Visit | Attending: Family | Admitting: Family

## 2012-11-26 ENCOUNTER — Ambulatory Visit (INDEPENDENT_AMBULATORY_CARE_PROVIDER_SITE_OTHER): Payer: BC Managed Care – PPO | Admitting: Family

## 2012-11-26 VITALS — BP 166/82 | HR 68 | Resp 16 | Ht 69.0 in | Wt 173.0 lb

## 2012-11-26 DIAGNOSIS — I722 Aneurysm of renal artery: Secondary | ICD-10-CM | POA: Insufficient documentation

## 2012-11-26 LAB — PACEMAKER DEVICE OBSERVATION
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
## 2012-11-29 ENCOUNTER — Other Ambulatory Visit: Payer: Self-pay

## 2012-11-29 NOTE — Progress Notes (Signed)
Remote pacer received  

## 2012-11-30 ENCOUNTER — Encounter: Payer: Self-pay | Admitting: Internal Medicine

## 2012-12-18 ENCOUNTER — Ambulatory Visit (INDEPENDENT_AMBULATORY_CARE_PROVIDER_SITE_OTHER): Payer: BC Managed Care – PPO | Admitting: Internal Medicine

## 2012-12-18 ENCOUNTER — Encounter: Payer: Self-pay | Admitting: Internal Medicine

## 2012-12-18 VITALS — BP 144/83 | HR 74 | Ht 69.0 in | Wt 172.1 lb

## 2012-12-18 DIAGNOSIS — I421 Obstructive hypertrophic cardiomyopathy: Secondary | ICD-10-CM

## 2012-12-18 DIAGNOSIS — R002 Palpitations: Secondary | ICD-10-CM

## 2012-12-18 DIAGNOSIS — R55 Syncope and collapse: Secondary | ICD-10-CM

## 2012-12-18 DIAGNOSIS — Z959 Presence of cardiac and vascular implant and graft, unspecified: Secondary | ICD-10-CM | POA: Insufficient documentation

## 2012-12-18 LAB — MDC_IDC_ENUM_SESS_TYPE_INCLINIC

## 2012-12-18 NOTE — Progress Notes (Signed)
Patient Care Team: [name], MD as PCP - General (Internal Medicine)   HPI  Stacey [name] is a 62 y.o. female Seen in followup for her in the recorder implanted for palpitations and near syncope in the setting of hypertrophic heart myopathy with prior myectomy  She has had no recurrent syncope  Past Medical History  Diagnosis Date  . HYPERLIPIDEMIA 08/01/2006  . OBESITY 09/16/2006  . SICKLE CELL ANEMIA 08/01/2006  . ANXIETY 09/16/2006  . DEPRESSION 09/16/2006  . HYPERTENSION 08/01/2006  . 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 s/p septal myomectomy 4/12 at [facility] with Dr. Silvestre Mesi;  b. echo 5/12: EF 60-65%, LVOT peak 18 mmHg; grade 1 diast dysfxn, mild SAM (improved since myomectomy;  c. 03/2012 Echo: EF 60-65%, mod-sev basal septal asymm hypertrophy, basal septal HK, LVOT grad , Gr 1 DD, , SAM, Mild MR, nl RV, PASP ; 08/2012 Echo: technically difficult, doubt LVOT obstruction, EF 60%, Gr 1 DD, mod-sev dil LA.  . BRADYCARDIA 10/13/2009  . ALLERGIC RHINITIS 12/07/2006  . ASTHMA 08/01/2006  . VENTRAL HERNIA 12/07/2006  . ANGIOEDEMA 03/07/2008  . ABDOMINAL WALL CONTUSION 02/18/2010  . Wheezing 04/01/2010  . DIVERTICULOSIS, COLON, WITH HEMORRHAGE 04/06/2010  . RECTAL BLEEDING 04/06/2010  . CAD (coronary artery disease)     a. 01/2010 : Minimal plaque at cardiac catheterization - CFX 20%, EF 70%;  b. 08/2012 Cath: Nl Cors, EF 65%.  Marland Kitchen LBBB (left bundle branch block) 06/17/2010  . Chronic diastolic CHF (congestive heart failure) 04/06/2010    a. In setting of HOCM.  Marland Kitchen Impaired glucose tolerance 06/25/2010  . DM (diabetes mellitus) in pregnancy, delivered w/postpartum condition 08/30/2010  . Type II or unspecified type diabetes mellitus without mention of complication, uncontrolled 08/30/2010  . COPD (chronic obstructive pulmonary disease)   . Frequent PVCs     a. Medtronic LINQ Reveal Loop Recorder  Serial Number  [id number]     Past Surgical History    Procedure Laterality Date  . Abdominal hysterectomy  1987  . Ventral hernia repair  06/2006  . Myomectomy      Septal  . Left heart cath  Aug. 18, 2014    Medtronic heart device    Current Outpatient Prescriptions  Medication Sig Dispense Refill  . atorvastatin (LIPITOR) 10 MG tablet Take 1 tablet (10 mg total) by mouth daily.  90 tablet  3  . cetirizine (ZYRTEC) 10 MG tablet Take 10 mg by mouth daily.        . fluticasone (FLONASE) 50 MCG/ACT nasal spray Place 2 sprays into the nose daily.  16 g  6  . irbesartan (AVAPRO) 300 MG tablet Take 1 tablet (300 mg total) by mouth at bedtime.  90 tablet  3  . metoprolol succinate (TOPROL-XL) 100 MG 24 hr tablet Take 100 mg by mouth every morning. Take with or immediately following a meal.      . oxcarbazepine (TRILEPTAL) 600 MG tablet Take 600 mg by mouth 2 (two) times daily.      . potassium chloride SA (K-DUR,KLOR-CON) 20 MEQ tablet Take 20 mEq by mouth daily as needed. For low potassium      . topiramate (TOPAMAX) 200 MG tablet Take 200 mg by mouth at bedtime.       No current facility-administered medications for this visit.    Allergies  Allergen Reactions  . Ace Inhibitors     REACTION: angioedema right eye  . Aspirin  Bleeding   . Codeine     Hallucinations   . Soy Allergy Other (See Comments)    Bleeding & cramps    Review of Systems negative except from HPI and PMH  Physical Exam BP 144/83  Pulse 74  Ht 5\' 9"  (1.753 m)  Wt 172 lb 1.9 oz (78.073 kg)  BMI 25.41 kg/m2 Well developed and nourished in no acute distress HENT normal Neck supple with JVP-flat Clear sternotomnoy Regular rate and rhythm,2/6 murmur Abd-soft with active BS No Clubbing cyanosis edema Skin-warm and dry A & Oriented  Grossly normal sensory and motor function  Loop recorder interrogation demonstrates no intercurrent recorded events   Assessment and  Plan

## 2012-12-18 NOTE — Assessment & Plan Note (Signed)
No recurrent syncope 

## 2012-12-18 NOTE — Patient Instructions (Addendum)
Your physician recommends that you continue on your current medications as directed. Please refer to the Current Medication list given to you today.  Remote monitoring is used to monitor your Pacemaker of ICD from home. This monitoring reduces the number of office visits required to check your device to one time per year. It allows [place] to keep an eye on the functioning of your device to ensure it is working properly. You are scheduled for a device check from home on 03/21/2013. You may send your transmission at any time that day. If you have a wireless device, the transmission will be sent automatically. After your physician reviews your transmission, you will receive a postcard with your next transmission date.  Your physician wants you to follow-up in: August 2015 with Dr. Graciela Husbands.  You will receive a reminder letter in the mail two months in advance. If you don't receive a letter, please call our office to schedule the follow-up appointment.

## 2012-12-18 NOTE — Assessment & Plan Note (Signed)
The patient's device was interrogated.  The information was reviewed. No changes were made in the programming.    

## 2012-12-18 NOTE — Assessment & Plan Note (Signed)
The patient has HCM. We have reviewed again the importance of family screening. Her 3 children have not yet been evaluated.

## 2012-12-24 ENCOUNTER — Encounter: Payer: Self-pay | Admitting: Internal Medicine

## 2012-12-25 ENCOUNTER — Other Ambulatory Visit: Payer: Self-pay | Admitting: *Deleted

## 2012-12-25 ENCOUNTER — Telehealth: Payer: Self-pay | Admitting: Internal Medicine

## 2012-12-25 ENCOUNTER — Encounter: Payer: Self-pay | Admitting: *Deleted

## 2012-12-25 NOTE — Telephone Encounter (Signed)
New Message  Pt called will have an Emergency dental procedure on 12/04 @ 3:30.. 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   Please call [facility] [phone]

## 2012-12-28 NOTE — Telephone Encounter (Signed)
Spoke with Stacey [name] at [facility] - patient not scheduled for procedure yesterday. She does have a consult on the 18th. Office will need clearance after consult - they will fax clearance request to office.

## 2013-01-18 ENCOUNTER — Ambulatory Visit (INDEPENDENT_AMBULATORY_CARE_PROVIDER_SITE_OTHER): Payer: BC Managed Care – PPO | Admitting: *Deleted

## 2013-01-18 DIAGNOSIS — R55 Syncope and collapse: Secondary | ICD-10-CM

## 2013-02-05 ENCOUNTER — Other Ambulatory Visit: Payer: Self-pay | Admitting: Occupational Medicine

## 2013-02-05 ENCOUNTER — Ambulatory Visit: Payer: Self-pay

## 2013-02-05 DIAGNOSIS — R52 Pain, unspecified: Secondary | ICD-10-CM

## 2013-02-15 ENCOUNTER — Ambulatory Visit: Payer: BC Managed Care – PPO | Admitting: Internal Medicine

## 2013-02-15 DIAGNOSIS — Z0289 Encounter for other administrative examinations: Secondary | ICD-10-CM

## 2013-02-20 ENCOUNTER — Ambulatory Visit (INDEPENDENT_AMBULATORY_CARE_PROVIDER_SITE_OTHER): Payer: BC Managed Care – PPO | Admitting: *Deleted

## 2013-02-20 DIAGNOSIS — R55 Syncope and collapse: Secondary | ICD-10-CM

## 2013-02-20 LAB — MDC_IDC_ENUM_SESS_TYPE_REMOTE

## 2013-03-19 LAB — MDC_IDC_ENUM_SESS_TYPE_REMOTE

## 2013-03-22 ENCOUNTER — Ambulatory Visit (INDEPENDENT_AMBULATORY_CARE_PROVIDER_SITE_OTHER): Payer: BC Managed Care – PPO | Admitting: *Deleted

## 2013-03-22 DIAGNOSIS — R55 Syncope and collapse: Secondary | ICD-10-CM

## 2013-03-22 LAB — MDC_IDC_ENUM_SESS_TYPE_REMOTE

## 2013-04-02 ENCOUNTER — Encounter: Payer: Self-pay | Admitting: *Deleted

## 2013-04-15 ENCOUNTER — Other Ambulatory Visit: Payer: Self-pay | Admitting: Internal Medicine

## 2013-04-23 ENCOUNTER — Ambulatory Visit (INDEPENDENT_AMBULATORY_CARE_PROVIDER_SITE_OTHER): Payer: BC Managed Care – PPO | Admitting: *Deleted

## 2013-04-23 DIAGNOSIS — R55 Syncope and collapse: Secondary | ICD-10-CM

## 2013-04-23 LAB — MDC_IDC_ENUM_SESS_TYPE_REMOTE

## 2013-04-29 ENCOUNTER — Encounter: Payer: Self-pay | Admitting: Internal Medicine

## 2013-04-30 ENCOUNTER — Encounter: Payer: Self-pay | Admitting: Internal Medicine

## 2013-05-24 ENCOUNTER — Ambulatory Visit (INDEPENDENT_AMBULATORY_CARE_PROVIDER_SITE_OTHER): Payer: BC Managed Care – PPO | Admitting: *Deleted

## 2013-05-24 DIAGNOSIS — R55 Syncope and collapse: Secondary | ICD-10-CM

## 2013-05-24 LAB — MDC_IDC_ENUM_SESS_TYPE_REMOTE

## 2013-06-11 ENCOUNTER — Encounter: Payer: Self-pay | Admitting: Internal Medicine

## 2013-06-20 ENCOUNTER — Encounter: Payer: Self-pay | Admitting: Internal Medicine

## 2013-06-24 ENCOUNTER — Ambulatory Visit (INDEPENDENT_AMBULATORY_CARE_PROVIDER_SITE_OTHER): Payer: BC Managed Care – PPO | Admitting: *Deleted

## 2013-06-24 DIAGNOSIS — R002 Palpitations: Secondary | ICD-10-CM

## 2013-06-24 LAB — MDC_IDC_ENUM_SESS_TYPE_REMOTE

## 2013-07-25 ENCOUNTER — Ambulatory Visit (INDEPENDENT_AMBULATORY_CARE_PROVIDER_SITE_OTHER): Payer: BC Managed Care – PPO | Admitting: *Deleted

## 2013-07-25 DIAGNOSIS — R55 Syncope and collapse: Secondary | ICD-10-CM

## 2013-07-26 LAB — MDC_IDC_ENUM_SESS_TYPE_REMOTE
Date Time Interrogation Session: 20150624001752
Zone Setting Detection Interval: 2000 ms
Zone Setting Detection Interval: 3000 ms
Zone Setting Detection Interval: 360 ms

## 2013-07-30 NOTE — Progress Notes (Signed)
Loop recorder 

## 2013-08-08 ENCOUNTER — Telehealth: Payer: Self-pay

## 2013-08-08 NOTE — Telephone Encounter (Signed)
Left call back number = MB was full and could not take any more messages.   Needs to schedule CPE with PCP

## 2013-08-12 ENCOUNTER — Encounter: Payer: Self-pay | Admitting: Internal Medicine

## 2013-08-23 ENCOUNTER — Ambulatory Visit (INDEPENDENT_AMBULATORY_CARE_PROVIDER_SITE_OTHER): Payer: BC Managed Care – PPO | Admitting: *Deleted

## 2013-08-23 ENCOUNTER — Encounter: Payer: Self-pay | Admitting: *Deleted

## 2013-08-23 DIAGNOSIS — R002 Palpitations: Secondary | ICD-10-CM

## 2013-08-28 NOTE — Progress Notes (Signed)
Loop recorder 

## 2013-08-29 LAB — MDC_IDC_ENUM_SESS_TYPE_REMOTE: Date Time Interrogation Session: 20150805040500

## 2013-08-30 ENCOUNTER — Encounter: Payer: Self-pay | Admitting: Internal Medicine

## 2013-09-04 ENCOUNTER — Telehealth: Payer: Self-pay | Admitting: Internal Medicine

## 2013-09-04 NOTE — Telephone Encounter (Signed)
error 

## 2013-09-16 ENCOUNTER — Other Ambulatory Visit: Payer: Self-pay | Admitting: Internal Medicine

## 2013-09-19 ENCOUNTER — Encounter: Payer: Self-pay | Admitting: Internal Medicine

## 2013-09-21 ENCOUNTER — Other Ambulatory Visit: Payer: Self-pay | Admitting: Internal Medicine

## 2013-09-23 ENCOUNTER — Ambulatory Visit (INDEPENDENT_AMBULATORY_CARE_PROVIDER_SITE_OTHER): Payer: BC Managed Care – PPO | Admitting: *Deleted

## 2013-09-23 ENCOUNTER — Other Ambulatory Visit: Payer: Self-pay | Admitting: Internal Medicine

## 2013-09-23 DIAGNOSIS — R002 Palpitations: Secondary | ICD-10-CM

## 2013-09-23 LAB — MDC_IDC_ENUM_SESS_TYPE_REMOTE

## 2013-09-26 NOTE — Progress Notes (Signed)
Loop recorder 

## 2013-10-07 ENCOUNTER — Ambulatory Visit (INDEPENDENT_AMBULATORY_CARE_PROVIDER_SITE_OTHER): Payer: BC Managed Care – PPO | Admitting: Internal Medicine

## 2013-10-07 ENCOUNTER — Encounter: Payer: Self-pay | Admitting: Internal Medicine

## 2013-10-07 VITALS — BP 150/106 | HR 69 | Temp 98.2°F | Wt 175.0 lb

## 2013-10-07 DIAGNOSIS — J209 Acute bronchitis, unspecified: Secondary | ICD-10-CM

## 2013-10-07 MED ORDER — AMOXICILLIN 500 MG PO CAPS: 500.0000 mg | ORAL_CAPSULE | Freq: Three times a day (TID) | ORAL | Status: DC

## 2013-10-07 MED ORDER — BENZONATATE 200 MG PO CAPS: 200.0000 mg | ORAL_CAPSULE | Freq: Three times a day (TID) | ORAL | Status: DC | PRN

## 2013-10-07 NOTE — Patient Instructions (Addendum)
Please remain out of work until 10/09/13  Carry room temperature water and sip liberally after coughing. Plain Mucinex (NOT D) for thick secretions ;force NON dairy fluids .   Nasal cleansing in the shower as discussed with lather of mild shampoo.After 10 seconds wash off lather while  exhaling through nostrils. Make sure that all residual soap is removed to prevent irritation.  Flonase OR Nasacort AQ 1 spray in each nostril twice a day as needed. Use the "crossover" technique into opposite nostril spraying toward opposite ear @ 45 degree angle, not straight up into nostril.  Plain Allegra (NOT D )  160 daily , Loratidine 10 mg , OR Zyrtec 10 mg @ bedtime  as needed for itchy eyes & sneezing.  Please take a probiotic , Florastor OR Align, every day until the bowels are normal. This will replace the normal bacteria which  are necessary for formation of normal stool and processing of food.

## 2013-10-07 NOTE — Progress Notes (Signed)
   Subjective:    Patient ID: Stacey [name], female    DOB: [birth year], 63 y.o.   MRN: [id number]  HPI   Her initial symptoms began 09/30/13 as rhinitis, head congestion, and sensation of feeling hot. She is unsure whether she had any definite fever  As of 9/9 she developed sore throat, chest congestion, and itchy, watery eyes and sneezing.  She does have a frontal headache but only with cough. She also had some earache without discharge.  The cough has been associated with some wheezing.  She's used DayQuil with partial response.  She does have extrinsic allergic rhinoconjunctivitis twice a year.  She has not smoked since 2000.      Review of Systems Persistent frontal headache, facial pain , nasal purulence, or dental pain denied. No chills or sweats.  She has had some loose stools without frank diarrhea.     Objective:   Physical Exam   Positive or pertinent findings include: She has a rumbling grade 1.5 systolic murmur at the left base with accentuated S2.  General appearance:good health ;well nourished; no acute distress or increased work of breathing is present.  No  lymphadenopathy about the head, neck, or axilla noted.   Eyes: No conjunctival inflammation or lid edema is present. There is no scleral icterus.  Ears:  External ear exam shows no significant lesions or deformities.  Otoscopic examination reveals clear canals, tympanic membranes are intact bilaterally without bulging, retraction, inflammation or discharge.  Nose:  External nasal examination shows no deformity or inflammation. Nasal mucosa are pink and moist without lesions or exudates. No septal dislocation or deviation.No obstruction to airflow.   Oral exam: Dental hygiene is good; lips and gums are healthy appearing.There is no oropharyngeal erythema or exudate noted.   Neck:  No deformities, thyromegaly, masses, or tenderness noted.   Supple with full range of motion without pain.    Heart:  Normal rate and regular rhythm. S1  normal without gallop,  click, rub or other extra sounds.   Lungs:Chest clear to auscultation; no wheezes, rhonchi,rales ,or rubs present.No increased work of breathing.    Extremities:  No cyanosis, edema, or clubbing  noted    Skin: Warm & dry w/o jaundice or tenting.         Assessment & Plan:  #1 acute bronchitis w/o bronchospasm #2 no URI Plan: See orders and recommendations

## 2013-10-07 NOTE — Progress Notes (Signed)
Pre visit review using our clinic review tool, if applicable. No additional management support is needed unless otherwise documented below in the visit note. 

## 2013-10-21 ENCOUNTER — Telehealth: Payer: Self-pay | Admitting: Physician Assistant

## 2013-10-21 ENCOUNTER — Encounter (HOSPITAL_COMMUNITY): Payer: Self-pay | Admitting: Emergency Medicine

## 2013-10-21 ENCOUNTER — Inpatient Hospital Stay (HOSPITAL_COMMUNITY)
Admission: EM | Admit: 2013-10-21 | Discharge: 2013-10-26 | DRG: 392 | Disposition: A | Discharge status: Home / Self Care | Payer: BC Managed Care – PPO | Attending: Internal Medicine | Admitting: Internal Medicine

## 2013-10-21 ENCOUNTER — Emergency Department (HOSPITAL_COMMUNITY): Payer: BC Managed Care – PPO

## 2013-10-21 ENCOUNTER — Ambulatory Visit (INDEPENDENT_AMBULATORY_CARE_PROVIDER_SITE_OTHER): Payer: BC Managed Care – PPO | Admitting: *Deleted

## 2013-10-21 DIAGNOSIS — R110 Nausea: Secondary | ICD-10-CM | POA: Diagnosis not present

## 2013-10-21 DIAGNOSIS — K5732 Diverticulitis of large intestine without perforation or abscess without bleeding: Principal | ICD-10-CM | POA: Diagnosis present

## 2013-10-21 DIAGNOSIS — J449 Chronic obstructive pulmonary disease, unspecified: Secondary | ICD-10-CM | POA: Diagnosis present

## 2013-10-21 DIAGNOSIS — D571 Sickle-cell disease without crisis: Secondary | ICD-10-CM | POA: Diagnosis present

## 2013-10-21 DIAGNOSIS — R945 Abnormal results of liver function studies: Secondary | ICD-10-CM

## 2013-10-21 DIAGNOSIS — Z9889 Other specified postprocedural states: Secondary | ICD-10-CM

## 2013-10-21 DIAGNOSIS — R7989 Other specified abnormal findings of blood chemistry: Secondary | ICD-10-CM

## 2013-10-21 DIAGNOSIS — I10 Essential (primary) hypertension: Secondary | ICD-10-CM | POA: Diagnosis present

## 2013-10-21 DIAGNOSIS — K859 Acute pancreatitis without necrosis or infection, unspecified: Secondary | ICD-10-CM | POA: Diagnosis present

## 2013-10-21 DIAGNOSIS — E785 Hyperlipidemia, unspecified: Secondary | ICD-10-CM | POA: Diagnosis present

## 2013-10-21 DIAGNOSIS — I447 Left bundle-branch block, unspecified: Secondary | ICD-10-CM | POA: Diagnosis present

## 2013-10-21 DIAGNOSIS — I5030 Unspecified diastolic (congestive) heart failure: Secondary | ICD-10-CM | POA: Diagnosis present

## 2013-10-21 DIAGNOSIS — Z79899 Other long term (current) drug therapy: Secondary | ICD-10-CM

## 2013-10-21 DIAGNOSIS — Z87891 Personal history of nicotine dependence: Secondary | ICD-10-CM

## 2013-10-21 DIAGNOSIS — R079 Chest pain, unspecified: Secondary | ICD-10-CM

## 2013-10-21 DIAGNOSIS — R0789 Other chest pain: Secondary | ICD-10-CM

## 2013-10-21 DIAGNOSIS — I059 Rheumatic mitral valve disease, unspecified: Secondary | ICD-10-CM

## 2013-10-21 DIAGNOSIS — K5730 Diverticulosis of large intestine without perforation or abscess without bleeding: Secondary | ICD-10-CM

## 2013-10-21 DIAGNOSIS — R002 Palpitations: Secondary | ICD-10-CM

## 2013-10-21 DIAGNOSIS — I2510 Atherosclerotic heart disease of native coronary artery without angina pectoris: Secondary | ICD-10-CM | POA: Diagnosis present

## 2013-10-21 DIAGNOSIS — R1310 Dysphagia, unspecified: Secondary | ICD-10-CM | POA: Diagnosis present

## 2013-10-21 DIAGNOSIS — I5032 Chronic diastolic (congestive) heart failure: Secondary | ICD-10-CM | POA: Diagnosis present

## 2013-10-21 DIAGNOSIS — R109 Unspecified abdominal pain: Secondary | ICD-10-CM

## 2013-10-21 DIAGNOSIS — I421 Obstructive hypertrophic cardiomyopathy: Secondary | ICD-10-CM | POA: Diagnosis present

## 2013-10-21 LAB — MDC_IDC_ENUM_SESS_TYPE_REMOTE: Date Time Interrogation Session: 20151004040500

## 2013-10-21 LAB — CBC
HCT: 39.9 % (ref 36.0–46.0)
HCT: 36.6 % (ref 36.0–46.0)
Hemoglobin: 13 g/dL (ref 12.0–15.0)
Hemoglobin: 11.9 g/dL — ABNORMAL LOW (ref 12.0–15.0)
MCH: 25 pg — ABNORMAL LOW (ref 26.0–34.0)
MCH: 25.1 pg — ABNORMAL LOW (ref 26.0–34.0)
MCHC: 32.6 g/dL (ref 30.0–36.0)
MCHC: 32.5 g/dL (ref 30.0–36.0)
MCV: 76.6 fL — ABNORMAL LOW (ref 78.0–100.0)
MCV: 77.1 fL — ABNORMAL LOW (ref 78.0–100.0)
Platelets: 214 10*3/uL (ref 150–400)
Platelets: 212 10*3/uL (ref 150–400)
RBC: 5.21 MIL/uL — ABNORMAL HIGH (ref 3.87–5.11)
RBC: 4.75 MIL/uL (ref 3.87–5.11)
RDW: 14.7 % (ref 11.5–15.5)
RDW: 14.7 % (ref 11.5–15.5)
WBC: 6.9 10*3/uL (ref 4.0–10.5)
WBC: 7 10*3/uL (ref 4.0–10.5)

## 2013-10-21 LAB — AMYLASE: Amylase: 480 U/L — ABNORMAL HIGH (ref 0–105)

## 2013-10-21 LAB — I-STAT TROPONIN, ED: Troponin i, poc: 0.03 ng/mL (ref 0.00–0.08)

## 2013-10-21 LAB — LIPASE, BLOOD: Lipase: 81 U/L — ABNORMAL HIGH (ref 11–59)

## 2013-10-21 LAB — BASIC METABOLIC PANEL
Anion gap: 14 (ref 5–15)
BUN: 10 mg/dL (ref 6–23)
CO2: 22 mEq/L (ref 19–32)
Calcium: 9.4 mg/dL (ref 8.4–10.5)
Chloride: 105 mEq/L (ref 96–112)
Creatinine, Ser: 0.56 mg/dL (ref 0.50–1.10)
GFR calc Af Amer: >90 mL/min (ref >90)
GFR calc non Af Amer: >90 mL/min (ref >90)
Glucose, Bld: 160 mg/dL — ABNORMAL HIGH (ref 70–99)
Potassium: 3.9 mEq/L (ref 3.7–5.3)
Sodium: 141 mEq/L (ref 137–147)

## 2013-10-21 LAB — TROPONIN I
Troponin I: <0.3 ng/mL (ref <0.30)
Troponin I: <0.3 ng/mL (ref <0.30)

## 2013-10-21 LAB — PROTIME-INR
INR: 1.23 (ref 0.00–1.49)
Prothrombin Time: 15.5 seconds — ABNORMAL HIGH (ref 11.6–15.2)

## 2013-10-21 LAB — CREATININE, SERUM
Creatinine, Ser: 0.61 mg/dL (ref 0.50–1.10)
GFR calc Af Amer: >90 mL/min (ref >90)
GFR calc non Af Amer: >90 mL/min (ref >90)

## 2013-10-21 LAB — PRO B NATRIURETIC PEPTIDE: Pro B Natriuretic peptide (BNP): 289.7 pg/mL — ABNORMAL HIGH (ref 0–125)

## 2013-10-21 LAB — D-DIMER, QUANTITATIVE: D-Dimer, Quant: 0.27 ug/mL-FEU (ref 0.00–0.48)

## 2013-10-21 MED ORDER — ENOXAPARIN SODIUM 40 MG/0.4ML ~~LOC~~ SOLN
40.0000 mg | SUBCUTANEOUS | Status: DC
  Administered 2013-10-21 – 2013-10-25 (×5): 40 mg via SUBCUTANEOUS
  Filled 2013-10-21 (×6): qty 0.4

## 2013-10-21 MED ORDER — ACETAMINOPHEN 325 MG PO TABS: 650.0000 mg | ORAL_TABLET | ORAL | Status: DC | PRN

## 2013-10-21 MED ORDER — LORATADINE 10 MG PO TABS
10.0000 mg | ORAL_TABLET | Freq: Every day | ORAL | Status: DC
  Administered 2013-10-21 – 2013-10-26 (×6): 10 mg via ORAL
  Filled 2013-10-21 (×6): qty 1

## 2013-10-21 MED ORDER — NITROGLYCERIN 0.4 MG SL SUBL
0.4000 mg | SUBLINGUAL_TABLET | SUBLINGUAL | Status: DC | PRN
  Administered 2013-10-21: 0.4 mg via SUBLINGUAL
  Filled 2013-10-21: qty 1

## 2013-10-21 MED ORDER — OXCARBAZEPINE 300 MG PO TABS
600.0000 mg | ORAL_TABLET | Freq: Two times a day (BID) | ORAL | Status: DC
  Administered 2013-10-21 – 2013-10-26 (×9): 600 mg via ORAL
  Filled 2013-10-21 (×12): qty 2

## 2013-10-21 MED ORDER — MORPHINE SULFATE 4 MG/ML IJ SOLN
4.0000 mg | Freq: Once | INTRAMUSCULAR | Status: AC
  Administered 2013-10-21: 4 mg via INTRAVENOUS
  Filled 2013-10-21: qty 1

## 2013-10-21 MED ORDER — PANTOPRAZOLE SODIUM 40 MG PO TBEC
40.0000 mg | DELAYED_RELEASE_TABLET | Freq: Every day | ORAL | Status: DC
  Administered 2013-10-21 – 2013-10-26 (×6): 40 mg via ORAL
  Filled 2013-10-21 (×5): qty 1

## 2013-10-21 MED ORDER — ONDANSETRON HCL 4 MG/2ML IJ SOLN
4.0000 mg | Freq: Four times a day (QID) | INTRAMUSCULAR | Status: DC | PRN
  Administered 2013-10-25: 4 mg via INTRAVENOUS
  Filled 2013-10-21: qty 2

## 2013-10-21 MED ORDER — MORPHINE SULFATE 2 MG/ML IJ SOLN
2.0000 mg | INTRAMUSCULAR | Status: DC | PRN
  Administered 2013-10-21: 2 mg via INTRAVENOUS
  Filled 2013-10-21: qty 1

## 2013-10-21 MED ORDER — METOPROLOL SUCCINATE ER 100 MG PO TB24
100.0000 mg | ORAL_TABLET | Freq: Every day | ORAL | Status: DC
  Administered 2013-10-21 – 2013-10-26 (×6): 100 mg via ORAL
  Filled 2013-10-21 (×6): qty 1

## 2013-10-21 MED ORDER — ATORVASTATIN CALCIUM 10 MG PO TABS
10.0000 mg | ORAL_TABLET | Freq: Every day | ORAL | Status: DC
  Administered 2013-10-22 – 2013-10-25 (×4): 10 mg via ORAL
  Filled 2013-10-21 (×6): qty 1

## 2013-10-21 MED ORDER — IRBESARTAN 300 MG PO TABS
300.0000 mg | ORAL_TABLET | Freq: Every day | ORAL | Status: DC
  Administered 2013-10-21 – 2013-10-25 (×5): 300 mg via ORAL
  Filled 2013-10-21 (×6): qty 1

## 2013-10-21 MED ORDER — TOPIRAMATE 100 MG PO TABS
100.0000 mg | ORAL_TABLET | Freq: Every day | ORAL | Status: DC
  Administered 2013-10-21 – 2013-10-25 (×5): 100 mg via ORAL
  Filled 2013-10-21 (×6): qty 1

## 2013-10-21 MED ORDER — ASPIRIN 81 MG PO CHEW
324.0000 mg | CHEWABLE_TABLET | Freq: Once | ORAL | Status: AC
  Administered 2013-10-21: 324 mg via ORAL
  Filled 2013-10-21: qty 4

## 2013-10-21 NOTE — Progress Notes (Signed)
  Echocardiogram 2D Echocardiogram has been performed.  Symeon Puleo FRANCES 10/21/2013, 3:40 PM

## 2013-10-21 NOTE — ED Notes (Signed)
Per pt sts chest pain and SOB that started last night. sts pain 8/10. Pt has Medtronic device ans sig cardiac hx.

## 2013-10-21 NOTE — ED Notes (Signed)
Report given to Liz Beach, RN on 3W

## 2013-10-21 NOTE — Telephone Encounter (Signed)
Pt called because she is having chest pain with some nausea and SOB. Wants to know what hospital to come to.  Recommended she call 911 and come to [facility]. Pt stated husband would bring her, that is the fastest way, she refused EMS but stated she would come right away.

## 2013-10-21 NOTE — ED Provider Notes (Signed)
CSN: [id number]     Arrival date & time 10/21/13  4098 History   First MD Initiated Contact with Patient 10/21/13 [phone]     Chief Complaint  Patient presents with  . Chest Pain     (Consider location/radiation/quality/duration/timing/severity/associated sxs/prior Treatment) Patient is a 63 y.o. female presenting with chest pain. The history is provided by the patient.  Chest Pain Pain location:  Substernal area Pain quality: pressure   Pain quality: not radiating   Pain radiates to:  Does not radiate Pain radiates to the back: no   Pain severity:  Moderate Onset quality:  Gradual Timing:  Constant Progression:  Worsening Chronicity:  New Context: at rest   Context: not breathing, no drug use and not eating   Relieved by:  Rest Worsened by:  Exertion Ineffective treatments:  None tried Associated symptoms: no abdominal pain, no cough, no fever, no shortness of breath and not vomiting     Past Medical History  Diagnosis Date  . HYPERLIPIDEMIA 08/01/2006  . OBESITY 09/16/2006  . SICKLE CELL ANEMIA 08/01/2006  . ANXIETY 09/16/2006  . DEPRESSION 09/16/2006  . HYPERTENSION 08/01/2006  . 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 s/p septal myomectomy 4/12 at [facility] with Dr. Silvestre Mesi;  b. echo 5/12: EF 60-65%, LVOT peak 18 mmHg; grade 1 diast dysfxn, mild SAM (improved since myomectomy;  c. 03/2012 Echo: EF 60-65%, mod-sev basal septal asymm hypertrophy, basal septal HK, LVOT grad , Gr 1 DD, , SAM, Mild MR, nl RV, PASP ; 08/2012 Echo: technically difficult, doubt LVOT obstruction, EF 60%, Gr 1 DD, mod-sev dil LA.  . BRADYCARDIA 10/13/2009  . ALLERGIC RHINITIS 12/07/2006  . ASTHMA 08/01/2006  . VENTRAL HERNIA 12/07/2006  . ANGIOEDEMA 03/07/2008  . ABDOMINAL WALL CONTUSION 02/18/2010  . Wheezing 04/01/2010  . DIVERTICULOSIS, COLON, WITH HEMORRHAGE 04/06/2010  . RECTAL BLEEDING 04/06/2010  . CAD (coronary artery disease)     a. 01/2010 : Minimal plaque at cardiac catheterization  - CFX 20%, EF 70%;  b. 08/2012 Cath: Nl Cors, EF 65%.  Marland Kitchen LBBB (left bundle branch block) 06/17/2010  . Chronic diastolic CHF (congestive heart failure) 04/06/2010    a. In setting of HOCM.  Marland Kitchen Impaired glucose tolerance 06/25/2010  . DM (diabetes mellitus) in pregnancy, delivered w/postpartum condition 08/30/2010  . Type II or unspecified type diabetes mellitus without mention of complication, uncontrolled 08/30/2010  . COPD (chronic obstructive pulmonary disease)   . Frequent PVCs     a. Medtronic LINQ Reveal Loop Recorder  Serial Number  [id number]    Past Surgical History  Procedure Laterality Date  . Abdominal hysterectomy  1987  . Ventral hernia repair  06/2006  . Myomectomy      Septal  . Left heart cath  Aug. 18, 2014    Medtronic heart device   Family History  Problem Relation Age of Onset  . Cancer Daughter     colon cancer and bleeding disorder  . Hyperlipidemia Daughter   . Heart disease Daughter   . Heart disease Father   . Heart attack Father   . Heart attack Sister   . Hyperlipidemia Sister   . Heart disease Sister   . Hyperlipidemia Brother    History  Substance Use Topics  . Smoking status: Former Games developer  . Smokeless tobacco: Never Used     Comment: Quit smoking 2001. Smoked on and off for 20 years. Smoked 2-3 cigars daily  . Alcohol Use: No   OB History  Grav Para Term Preterm Abortions TAB SAB Ect Mult Living                 Review of Systems  Constitutional: Negative for fever and chills.  Respiratory: Negative for cough and shortness of breath.   Cardiovascular: Negative for chest pain and leg swelling.  Gastrointestinal: Negative for vomiting and abdominal pain.  All other systems reviewed and are negative.     Allergies  Ace inhibitors; Aspirin; Codeine; and Soy allergy  Home Medications   Prior to Admission medications   Medication Sig Start Date End Date Taking? Authorizing Provider  amoxicillin (AMOXIL) 500 MG capsule Take 1 capsule (500 mg  total) by mouth 3 (three) times daily. 10/07/13   Pecola Lawless, MD  atorvastatin (LIPITOR) 10 MG tablet TAKE 1 TABLET BY MOUTH EVERY DAY 09/23/13   [name], MD  benzonatate (TESSALON) 200 MG capsule Take 1 capsule (200 mg total) by mouth 3 (three) times daily as needed for cough. 10/07/13   Pecola Lawless, MD  cetirizine (ZYRTEC) 10 MG tablet Take 10 mg by mouth daily.      Historical Provider, MD  fluticasone (FLONASE) 50 MCG/ACT nasal spray Place 2 sprays into the nose daily. 01/17/12   Lorre Munroe, NP  irbesartan (AVAPRO) 300 MG tablet TAKE 1 TABLET BY MOUTH EVERY NIGHT AT BEDTIME 09/17/13   [name], MD  metoprolol succinate (TOPROL-XL) 100 MG 24 hr tablet TAKE 1 TABLET BY MOUTH EVERY DAY    [name], MD  oxcarbazepine (TRILEPTAL) 600 MG tablet Take 600 mg by mouth 2 (two) times daily.    Historical Provider, MD  potassium chloride SA (K-DUR,KLOR-CON) 20 MEQ tablet Take 20 mEq by mouth daily as needed. For low potassium    Historical Provider, MD  topiramate (TOPAMAX) 200 MG tablet Take 200 mg by mouth at bedtime. Tapering off currently on 100 mg then will reduce to 50 mg then reduce to 25 mg    Historical Provider, MD   BP 154/75  Pulse 75  Temp(Src) 98.1 F (36.7 C)  Resp 18  SpO2 98% Physical Exam  Nursing note and vitals reviewed. Constitutional: She is oriented to person, place, and time. She appears well-developed and well-nourished. No distress.  HENT:  Head: Normocephalic and atraumatic.  Mouth/Throat: Oropharynx is clear and moist.  Eyes: EOM are normal. Pupils are equal, round, and reactive to light.  Neck: Normal range of motion. Neck supple.  Cardiovascular: Normal rate and regular rhythm.  Exam reveals no friction rub.   No murmur heard. Pulmonary/Chest: Effort normal and breath sounds normal. No respiratory distress. She has no wheezes. She has no rales.  Abdominal: Soft. She exhibits no distension. There is no tenderness. There is no rebound.   Musculoskeletal: Normal range of motion. She exhibits no edema.  Neurological: She is alert and oriented to person, place, and time. She exhibits normal muscle tone.  Skin: No rash noted. She is not diaphoretic.    ED Course  Procedures (including critical care time) Labs Review Labs Reviewed  CBC  BASIC METABOLIC PANEL  PRO B NATRIURETIC PEPTIDE  PROTIME-INR  I-STAT TROPOININ, ED    Imaging Review Dg Chest 2 View  10/21/2013   CLINICAL DATA:  Chest pain.  EXAM: CHEST  2 VIEW  COMPARISON:  09/10/2012  FINDINGS: Patient has had median sternotomy. A loop recorder overlies the anterior chest. Heart is mildly enlarged. There are no focal consolidations or pleural effusions. Minimal  scarring is seen in the lingula. There are no focal consolidations or pleural effusions.  IMPRESSION: Cardiomegaly.  No evidence for acute pulmonary abnormality.   Electronically Signed   By: Rosalie Gums M.D.   On: 10/21/2013 08:25     EKG Interpretation   Date/Time:  Monday October 21 2013 07:27:21 EDT Ventricular Rate:  72 PR Interval:  174 QRS Duration: 156 QT Interval:  432 QTC Calculation: 473 R Axis:   53 Text Interpretation:  Normal sinus rhythm Left bundle branch block  Abnormal ECG LBBB similar to prior from 08/2012 Confirmed by [facility]  MD,  Isadore Bokhari ([id number]) on 10/21/2013 7:32:23 AM      MDM   Final diagnoses:  Chest pain syndrome  Abdominal pain, other specified site  Hypertrophic obstructive cardiomyopathy(425.11)  LBBB (left bundle branch block)  Status post myomectomy    8F with extensive cardiac history including hypertrophic obstructive cardiomyopathy, CHF, coronary artery disease presents with chest pain. Described as vice-like tightness with radiation to left side for past 3 days or so. Associated shortness of breath, generalized weakness, nausea. Here vitals stable. EKG with similar left bundle branch block appearance. Exam benign. Plan for cardiology evaluation. Admitted by  Cards.    Elwin Mocha, MD 10/21/13 [phone]

## 2013-10-21 NOTE — H&P (Signed)
Physician History and Physical    Patient ID: Stacey [name] MRN: [id number] DOB/AGE: 1950-12-16 63 y.o. Admit date: 10/21/2013  Primary Care Physician: Cathlean Cower, MD Primary Cardiologist: [name] MD  HPI: [name] with history of HOCM s/p septal myectomy presents to the ED today with complaints of chest pain, dyspnea, and nausea. She reports a 2 day history of chest pressure radiating to her left shoulder. It is constant with variable severity. She also has associated dyspnea with mild orthopnea. This am she developed acute nausea without vomiting and felt like the chest pressure is worse so she presented to the ED. She was treated for acute bronchitis 2 weeks ago with a one week course of amoxicillin. She still has a mild nonproductive cough. She has occasional loose stools but states this has not changed. No fever or chills. She is s/p septal myectomy in 4/12 at [facility] for HOCM. She was admitted in August 2014 with palpitations, presyncope and mildly elevated POC troponin. Cardiac cath showed normal coronary anatomy. Echo showed no LVOT obstruction and normal EF. An implantable loop recorder was placed and to date she has had no significant arrhythmia.   Review of systems complete and found to be negative unless listed above  Past Medical History  Diagnosis Date  . HYPERLIPIDEMIA 08/01/2006  . OBESITY 09/16/2006  . SICKLE CELL ANEMIA 08/01/2006  . ANXIETY 09/16/2006  . DEPRESSION 09/16/2006  . HYPERTENSION 08/01/2006  . 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 s/p septal myomectomy 4/12 at [place] with Dr. Evelina Dun;  b. echo 5/12: EF 60-65%, LVOT peak 18 mmHg; grade 1 diast dysfxn, mild SAM (improved since myomectomy;  c. 03/2012 Echo: EF 60-65%, mod-sev basal septal asymm hypertrophy, basal septal HK, LVOT grad 66mHg, Gr 1 DD, , SAM, Mild MR, nl RV, PASP 338mg; 08/2012 Echo: technically difficult, doubt LVOT obstruction, EF 60%, Gr 1 DD, mod-sev dil LA.  .  BRADYCARDIA 10/13/2009  . ALLERGIC RHINITIS 12/07/2006  . ASTHMA 08/01/2006  . VENTRAL HERNIA 12/07/2006  . ANGIOEDEMA 03/07/2008  . ABDOMINAL WALL CONTUSION 02/18/2010  . Wheezing 04/01/2010  . DIVERTICULOSIS, COLON, WITH HEMORRHAGE 04/06/2010  . RECTAL BLEEDING 04/06/2010  . CAD (coronary artery disease)     a. 01/2010 : Minimal plaque at cardiac catheterization - CFX 20%, EF 70%;  b. 08/2012 Cath: Nl Cors, EF 65%.  . Marland KitchenBBB (left bundle branch block) 06/17/2010  . Chronic diastolic CHF (congestive heart failure) 04/06/2010    a. In setting of HOCM.  . Marland Kitchenmpaired glucose tolerance 06/25/2010  . DM (diabetes mellitus) in pregnancy, delivered w/postpartum condition 08/30/2010  . Type II or unspecified type diabetes mellitus without mention of complication, uncontrolled 08/30/2010  . COPD (chronic obstructive pulmonary disease)   . Frequent PVCs     a. Medtronic LINQ Reveal Loop Recorder  Serial Number  [id number]     Family History  Problem Relation Age of Onset  . Cancer Daughter     colon cancer and bleeding disorder  . Hyperlipidemia Daughter   . Heart disease Daughter   . Heart disease Father   . Heart attack Father   . Heart attack Sister   . Hyperlipidemia Sister   . Heart disease Sister   . Hyperlipidemia Brother     History   Social History  . Marital Status: Married    Spouse Name: N/A    Number of Children: N/A  . Years of Education: N/A   Occupational History  . Not on file.  Social History Main Topics  . Smoking status: Former Research scientist (life sciences)  . Smokeless tobacco: Never Used     Comment: Quit smoking 2001. Smoked on and off for 20 years. Smoked 2-3 cigars daily  . Alcohol Use: No  . Drug Use: No  . Sexual Activity: Not on file   Other Topics Concern  . Not on file   Social History Narrative   Lives in [place] with husband.  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    Past Surgical History  Procedure Laterality Date  . Abdominal hysterectomy  1987  .  Ventral hernia repair  06/2006  . Myomectomy      Septal  . Left heart cath  Aug. 18, 2014    Medtronic heart device       Medication List    ASK your doctor about these medications          atorvastatin 10 MG tablet  Commonly known as:  LIPITOR  Take 10 mg by mouth daily.     cetirizine 10 MG tablet  Commonly known as:  ZYRTEC  Take 10 mg by mouth daily.     fluticasone 50 MCG/ACT nasal spray  Commonly known as:  FLONASE  Place 2 sprays into the nose daily.     irbesartan 300 MG tablet  Commonly known as:  AVAPRO  Take 300 mg by mouth at bedtime.     metoprolol succinate 100 MG 24 hr tablet  Commonly known as:  TOPROL-XL  Take 100 mg by mouth daily. Take with or immediately following a meal.     oxcarbazepine 600 MG tablet  Commonly known as:  TRILEPTAL  Take 600 mg by mouth 2 (two) times daily.     potassium chloride SA 20 MEQ tablet  Commonly known as:  K-DUR,KLOR-CON  Take 20 mEq by mouth daily as needed. For low potassium     topiramate 200 MG tablet  Commonly known as:  TOPAMAX  Take 100 mg by mouth at bedtime. Tapering off currently on 100 mg then will reduce to 50 mg then reduce to 25 mg        Physical Exam: Blood pressure 154/79, pulse 78, temperature 98.7 F (37.1 C), temperature source Oral, resp. rate 20, SpO2 95.00%. Current Weight  10/07/13 175 lb (79.379 kg)  12/18/12 172 lb 1.9 oz (78.073 kg)  11/26/12 173 lb (78.472 kg)    GENERAL:  Well appearing obese BF in NAD HEENT:  PERRL, EOMI, sclera are clear. Oropharynx is clear. NECK:  No jugular venous distention, carotid upstroke brisk and symmetric, no bruits, no thyromegaly or adenopathy LUNGS:  Clear to auscultation bilaterally CHEST:  Unremarkable HEART:  RRR,  PMI not displaced or sustained,S1 and S2 within normal limits, no S3, no S4: no clicks, no rubs. There is a grade 2/6 systolic ejection murmur LSB. ABD:  Soft, mildly tender. BS +, no masses or bruits. No hepatomegaly, no  splenomegaly EXT:  2 + pulses throughout, no edema, no cyanosis no clubbing SKIN:  Warm and dry.  No rashes NEURO:  Alert and oriented x 3. Cranial nerves II through XII intact. PSYCH:  Cognitively intact    Labs:   Lab Results  Component Value Date   WBC 6.9 10/21/2013   HGB 13.0 10/21/2013   HCT 39.9 10/21/2013   MCV 76.6* 10/21/2013   PLT 214 10/21/2013    Recent Labs Lab 10/21/13 0800  NA 141  K 3.9  CL 105  CO2 22  BUN 10  CREATININE 0.56  CALCIUM 9.4  GLUCOSE 160*   Lab Results  Component Value Date   CKMB 4.3* 01/26/2010   CKMB 4.4* 01/26/2010   CKMB 4.4* 01/25/2010   TROPONINI <0.30 10/21/2013   TROPONINI <0.30 09/10/2012   TROPONINI  Value: 0.05        NO INDICATION OF MYOCARDIAL INJURY. 01/26/2010    Lab Results  Component Value Date   CHOL 185 09/11/2012   CHOL 277* 07/24/2012   CHOL 227* 03/14/2012   Lab Results  Component Value Date   HDL 39* 09/11/2012   HDL 45.50 07/24/2012   HDL 43.80 03/14/2012   Lab Results  Component Value Date   LDLCALC 79 09/11/2012   LDLCALC 84 03/04/2011   LDLCALC  Value: 141        Total Cholesterol/HDL:CHD Risk Coronary Heart Disease Risk Table                     Men   Women  1/2 Average Risk   3.4   3.3  Average Risk       5.0   4.4  2 X Average Risk   9.6   7.1  3 X Average Risk  23.4   11.0        Use the calculated Patient Ratio above and the CHD Risk Table to determine the patient's CHD Risk.        ATP III CLASSIFICATION (LDL):  <100     mg/dL   Optimal  100-129  mg/dL   Near or Above                    Optimal  130-159  mg/dL   Borderline  160-189  mg/dL   High  >190     mg/dL   Very High* 01/25/2010   Lab Results  Component Value Date   TRIG 334* 09/11/2012   TRIG 588.0 Triglyceride is over 400; calculations on Lipids are invalid.* 07/24/2012   TRIG 362.0* 03/14/2012   Lab Results  Component Value Date   CHOLHDL 4.7 09/11/2012   CHOLHDL 6 07/24/2012   CHOLHDL 5 03/14/2012   Lab Results  Component Value Date   LDLDIRECT 146.1  07/24/2012   LDLDIRECT 116.4 03/14/2012   LDLDIRECT 105.0 08/25/2011    Lab Results  Component Value Date   PROBNP 289.7* 10/21/2013   PROBNP 303.3* 09/11/2012   PROBNP 983.0* 01/25/2010   Lab Results  Component Value Date   TSH 0.81 07/24/2012   Lab Results  Component Value Date   HGBA1C 7.2* 07/24/2012    Radiology: CHEST 2 VIEW  COMPARISON: 09/10/2012  FINDINGS:  Patient has had median sternotomy. A loop recorder overlies the  anterior chest. Heart is mildly enlarged. There are no focal  consolidations or pleural effusions. Minimal scarring is seen in the  lingula. There are no focal consolidations or pleural effusions.  IMPRESSION:  Cardiomegaly. No evidence for acute pulmonary abnormality.  Electronically Signed  By: Shon Hale M.D.  On: 10/21/2013 08:25  EKG: NSR with LBBB  ASSESSMENT AND PLAN:  1. Atypical chest pain with pressure, dyspnea, nausea. No overt evidence of CHF. BNP is at baseline. Ecg shows chronic LBBB and initial troponins are negative. Acute coronary syndrome is unlikely given normal coronaries last year. Other potential etiologies include GERD, gallbladder disease, PE. Plan to place in observation. Cycle cardiac enzymes. Update Echo. Start PPI. Assess d-Dimer, amylase, and lipase.  2. HOCM  s/p septal myectomy 3. History of presyncope 1 year ago with negative loop recorder to date. 4. LBBB 5. HTN 6. History of asthma/COPD. 7. Hyperlipidemia. On statin. Check lipid panel.  Signed: Atira Borello [place], [place]  10/21/2013, 9:21 AM

## 2013-10-22 ENCOUNTER — Encounter (HOSPITAL_COMMUNITY): Payer: Self-pay | Admitting: Radiology

## 2013-10-22 ENCOUNTER — Inpatient Hospital Stay (HOSPITAL_COMMUNITY): Payer: BC Managed Care – PPO

## 2013-10-22 DIAGNOSIS — R1310 Dysphagia, unspecified: Secondary | ICD-10-CM | POA: Diagnosis present

## 2013-10-22 DIAGNOSIS — D571 Sickle-cell disease without crisis: Secondary | ICD-10-CM | POA: Diagnosis present

## 2013-10-22 DIAGNOSIS — K859 Acute pancreatitis without necrosis or infection, unspecified: Secondary | ICD-10-CM | POA: Diagnosis present

## 2013-10-22 DIAGNOSIS — I421 Obstructive hypertrophic cardiomyopathy: Secondary | ICD-10-CM | POA: Diagnosis present

## 2013-10-22 DIAGNOSIS — I5032 Chronic diastolic (congestive) heart failure: Secondary | ICD-10-CM | POA: Diagnosis not present

## 2013-10-22 DIAGNOSIS — I2510 Atherosclerotic heart disease of native coronary artery without angina pectoris: Secondary | ICD-10-CM | POA: Diagnosis present

## 2013-10-22 DIAGNOSIS — J449 Chronic obstructive pulmonary disease, unspecified: Secondary | ICD-10-CM | POA: Diagnosis present

## 2013-10-22 DIAGNOSIS — R110 Nausea: Secondary | ICD-10-CM | POA: Diagnosis present

## 2013-10-22 DIAGNOSIS — K5732 Diverticulitis of large intestine without perforation or abscess without bleeding: Secondary | ICD-10-CM | POA: Diagnosis not present

## 2013-10-22 DIAGNOSIS — I509 Heart failure, unspecified: Secondary | ICD-10-CM | POA: Diagnosis not present

## 2013-10-22 DIAGNOSIS — Z79899 Other long term (current) drug therapy: Secondary | ICD-10-CM | POA: Diagnosis not present

## 2013-10-22 DIAGNOSIS — E785 Hyperlipidemia, unspecified: Secondary | ICD-10-CM | POA: Diagnosis present

## 2013-10-22 DIAGNOSIS — I10 Essential (primary) hypertension: Secondary | ICD-10-CM | POA: Diagnosis present

## 2013-10-22 DIAGNOSIS — Z87891 Personal history of nicotine dependence: Secondary | ICD-10-CM | POA: Diagnosis not present

## 2013-10-22 DIAGNOSIS — K5730 Diverticulosis of large intestine without perforation or abscess without bleeding: Secondary | ICD-10-CM

## 2013-10-22 DIAGNOSIS — I447 Left bundle-branch block, unspecified: Secondary | ICD-10-CM | POA: Diagnosis present

## 2013-10-22 LAB — HEPATIC FUNCTION PANEL
ALT: 20 U/L (ref 0–35)
AST: 16 U/L (ref 0–37)
Albumin: 3.7 g/dL (ref 3.5–5.2)
Alkaline Phosphatase: 163 U/L — ABNORMAL HIGH (ref 39–117)
Bilirubin, Direct: <0.2 mg/dL (ref 0.0–0.3)
Total Bilirubin: 0.3 mg/dL (ref 0.3–1.2)
Total Protein: 7.1 g/dL (ref 6.0–8.3)

## 2013-10-22 LAB — TROPONIN I
Troponin I: <0.3 ng/mL (ref <0.30)
Troponin I: <0.3 ng/mL (ref <0.30)

## 2013-10-22 LAB — LIPASE, BLOOD: Lipase: 31 U/L (ref 11–59)

## 2013-10-22 LAB — AMYLASE: Amylase: 127 U/L — ABNORMAL HIGH (ref 0–105)

## 2013-10-22 MED ORDER — SUCRALFATE 1 GM/10ML PO SUSP
1.0000 g | Freq: Three times a day (TID) | ORAL | Status: DC
  Administered 2013-10-22 – 2013-10-26 (×15): 1 g via ORAL
  Filled 2013-10-22 (×20): qty 10

## 2013-10-22 MED ORDER — CIPROFLOXACIN IN D5W 400 MG/200ML IV SOLN
400.0000 mg | Freq: Two times a day (BID) | INTRAVENOUS | Status: DC
  Administered 2013-10-22 – 2013-10-25 (×7): 400 mg via INTRAVENOUS
  Filled 2013-10-22 (×8): qty 200

## 2013-10-22 MED ORDER — METRONIDAZOLE IN NACL 5-0.79 MG/ML-% IV SOLN
500.0000 mg | Freq: Three times a day (TID) | INTRAVENOUS | Status: DC
  Administered 2013-10-23 – 2013-10-26 (×11): 500 mg via INTRAVENOUS
  Filled 2013-10-22 (×13): qty 100

## 2013-10-22 MED ORDER — INFLUENZA VAC SPLIT QUAD 0.5 ML IM SUSY
0.5000 mL | PREFILLED_SYRINGE | INTRAMUSCULAR | Status: AC
  Administered 2013-10-23: 0.5 mL via INTRAMUSCULAR
  Filled 2013-10-22: qty 0.5

## 2013-10-22 MED ORDER — SODIUM CHLORIDE 0.9 % IV SOLN
INTRAVENOUS | Status: DC
  Administered 2013-10-22: 13:00:00 via INTRAVENOUS
  Administered 2013-10-24: 10 mL/h via INTRAVENOUS

## 2013-10-22 MED ORDER — IOHEXOL 300 MG/ML  SOLN
25.0000 mL | INTRAMUSCULAR | Status: AC
  Administered 2013-10-22 (×2): 25 mL via ORAL

## 2013-10-22 MED ORDER — IOHEXOL 300 MG/ML  SOLN
100.0000 mL | Freq: Once | INTRAMUSCULAR | Status: AC | PRN
  Administered 2013-10-22: 100 mL via INTRAVENOUS

## 2013-10-22 NOTE — Consult Note (Signed)
Referring Provider: [facility] Primary Care Physician:  Cathlean Cower, MD Primary Gastroenterologist:  Dr.Gessner  Reason for Consultation:  Pancreatitis, odynophagia   HPI: Stacey [name] is a 63 y.o. female admitted through the ER due to chest pain. She has a history of hypertrophic cardiomyopathy s/p septal myectomy, CAD, LBBB, HTN, diverticulosis with hemorrhage, CHF and hyperlipidemia.She came to the ER due to 2 days of chest pressure and tightness, associated with nausea. She says she has had pain when sfor the past week or so and feels things go down slow. No regurgitation. Stacey [name] been formed to mushy, which she says is her norm. No BRBPR. NO melena. She has some epigastric and LUQ abd pain that she describe as a pressure. Her discomfort radiates to left shoulder blade area. Her amylase and liwere found to be elevated as was her alk phos, but transaminases are normal. Denies prior hx of pancreatitis.Denies ETOH. Appetite has been decreased for the past 2 weeks, but weight has been stable. No clay colored stools or dark urine.Pt has had colonoscopy in past but says she has not had EGD in past.   Past Medical History  Diagnosis Date  . HYPERLIPIDEMIA 08/01/2006  . OBESITY 09/16/2006  . SICKLE CELL ANEMIA 08/01/2006  . ANXIETY 09/16/2006  . DEPRESSION 09/16/2006  . HYPERTENSION 08/01/2006  . 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 s/p septal myomectomy 4/12 at [place] with Dr. Evelina Dun;  b. echo 5/12: EF 60-65%, LVOT peak 18 mmHg; grade 1 diast dysfxn, mild SAM (improved since myomectomy;  c. 03/2012 Echo: EF 60-65%, mod-sev basal septal asymm hypertrophy, basal septal HK, LVOT grad 16mHg, Gr 1 DD, , SAM, Mild MR, nl RV, PASP 36mg; 08/2012 Echo: technically difficult, doubt LVOT obstruction, EF 60%, Gr 1 DD, mod-sev dil LA.  . BRADYCARDIA 10/13/2009  . ALLERGIC RHINITIS 12/07/2006  . ASTHMA 08/01/2006  . VENTRAL HERNIA 12/07/2006  . ANGIOEDEMA 03/07/2008  . ABDOMINAL  WALL CONTUSION 02/18/2010  . Wheezing 04/01/2010  . DIVERTICULOSIS, COLON, WITH HEMORRHAGE 04/06/2010  . RECTAL BLEEDING 04/06/2010  . CAD (coronary artery disease)     a. 01/2010 : Minimal plaque at cardiac catheterization - CFX 20%, EF 70%;  b. 08/2012 Cath: Nl Cors, EF 65%.  . Marland KitchenBBB (left bundle branch block) 06/17/2010  . Chronic diastolic CHF (congestive heart failure) 04/06/2010    a. In setting of HOCM.  . Marland Kitchenmpaired glucose tolerance 06/25/2010  . DM (diabetes mellitus) in pregnancy, delivered w/postpartum condition 08/30/2010  . Type II or unspecified type diabetes mellitus without mention of complication, uncontrolled 08/30/2010  . COPD (chronic obstructive pulmonary disease)   . Frequent PVCs     a. Medtronic LINQ Reveal Loop Recorder  Serial Number  [id number]     Past Surgical History  Procedure Laterality Date  . Abdominal hysterectomy  1987  . Ventral hernia repair  06/2006  . Myomectomy      Septal  . Left heart cath  Aug. 18, 2014    Medtronic heart device    Prior to Admission medications   Medication Sig Start Date End Date Taking? Authorizing Provider  atorvastatin (LIPITOR) 10 MG tablet Take 10 mg by mouth daily.   Yes Historical Provider, MD  cetirizine (ZYRTEC) 10 MG tablet Take 10 mg by mouth daily.     Yes Historical Provider, MD  fluticasone (FLONASE) 50 MCG/ACT nasal spray Place 2 sprays into the nose daily. 01/17/12  Yes ReJearld FentonNP  irbesartan (AVAPRO) 300 MG tablet Take  300 mg by mouth at bedtime.   Yes Historical Provider, MD  metoprolol succinate (TOPROL-XL) 100 MG 24 hr tablet Take 100 mg by mouth daily. Take with or immediately following a meal.   Yes Historical Provider, MD  oxcarbazepine (TRILEPTAL) 600 MG tablet Take 600 mg by mouth 2 (two) times daily.   Yes Historical Provider, MD  potassium chloride SA (K-DUR,KLOR-CON) 20 MEQ tablet Take 20 mEq by mouth daily as needed. For low potassium   Yes Historical Provider, MD  topiramate (TOPAMAX) 200 MG tablet  Take 100 mg by mouth at bedtime. Tapering off currently on 100 mg then will reduce to 50 mg then reduce to 25 mg   Yes Historical Provider, MD  amoxicillin (AMOXIL) 500 MG capsule Take 1 capsule (500 mg total) by mouth 3 (three) times daily. 10/07/13   Hendricks Limes, MD    Current Facility-Administered Medications  Medication Dose Route Frequency Provider Last Rate Last Dose  . acetaminophen (TYLENOL) tablet 650 mg  650 mg Oral Q4H PRN Peter M Martinique, MD      . atorvastatin (LIPITOR) tablet 10 mg  10 mg Oral Daily Peter M Martinique, MD      . enoxaparin (LOVENOX) injection 40 mg  40 mg Subcutaneous Q24H Peter M Martinique, MD   40 mg at 10/21/13 1459  . irbesartan (AVAPRO) tablet 300 mg  300 mg Oral QHS Peter M Martinique, MD   300 mg at 10/21/13 2127  . loratadine (CLARITIN) tablet 10 mg  10 mg Oral Daily Peter M Martinique, MD   10 mg at 10/21/13 1458  . metoprolol succinate (TOPROL-XL) 24 hr tablet 100 mg  100 mg Oral Daily Peter M Martinique, MD   100 mg at 10/21/13 1458  . morphine 2 MG/ML injection 2 mg  2 mg Intravenous Q2H PRN Peter M Martinique, MD   2 mg at 10/21/13 1925  . ondansetron (ZOFRAN) injection 4 mg  4 mg Intravenous Q6H PRN Peter M Martinique, MD      . Oxcarbazepine (TRILEPTAL) tablet 600 mg  600 mg Oral BID Peter M Martinique, MD   600 mg at 10/21/13 2128  . pantoprazole (PROTONIX) EC tablet 40 mg  40 mg Oral Daily Peter M Martinique, MD   40 mg at 10/21/13 1459  . topiramate (TOPAMAX) tablet 100 mg  100 mg Oral QHS Peter M Martinique, MD   100 mg at 10/21/13 2127    Allergies as of 10/21/2013 - Review Complete 10/21/2013  Allergen Reaction Noted  . Dairy aid [lactase] Diarrhea and Other (See Comments) 10/21/2013  . Ace inhibitors Other (See Comments) 01/14/2010  . Aspirin Other (See Comments)   . Codeine Other (See Comments) 09/16/2006  . Soy allergy Other (See Comments) 06/17/2010    Family History  Problem Relation Age of Onset  . Cancer Daughter     colon cancer and bleeding disorder  .  Hyperlipidemia Daughter   . Heart disease Daughter   . Heart disease Father   . Heart attack Father   . Heart attack Sister   . Hyperlipidemia Sister   . Heart disease Sister   . Hyperlipidemia Brother     History   Social History  . Marital Status: Married    Spouse Name: N/A    Number of Children: N/A  . Years of Education: N/A   Occupational History  . Not on file.   Social History Main Topics  . Smoking status: Former Research scientist (life sciences)  . Smokeless tobacco: Never  Used     Comment: Quit smoking 2001. Smoked on and off for 20 years. Smoked 2-3 cigars daily  . Alcohol Use: No  . Drug Use: No  . Sexual Activity: Not on file   Other Topics Concern  . Not on file   Social History Narrative   Lives in [place] with husband.  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    Review of Systems: Gen: Denies any fever, chills, sweats, anorexia, fatigue, weakness, malaise, weight loss, and sleep disorder CV: Denies , angina, palpitations, syncope, orthopnea, PND, peripheral edema, and claudication.Was seen iER for chest pressure. Resp: Denies dyspnea at rest, dyspnea with exercise, cough, sputum, wheezing, coughing up blood, and pleurisy. GI: Denies vomiting blood, jaundice, and fecal incontinence.  Fpain in mid chest when swallowing. GU : Denies urinary burning, blood in urine, urinary frequency, urinary hesitancy, nocturnal urination, and urinary incontinence. MS: Denies joint pain, limitation of movement, and swelling, stiffness, low back pain, extremity pain. Denies muscle weakness, cramps, atrophy.  Derm: Denies rash, itching, dry skin, hives, moles, warts, or unhealing ulcers.  Psych: Denies depression, anxiety, memory loss, suicidal ideation, hallucinations, paranoia, and confusion. Heme: Denies bruising, bleeding, and enlarged lymph nodes. Neuro:  Denies any headaches, dizziness, paresthesias. Endo:  Denies any problems with DM, thyroid, adrenal function.  Physical  Exam: Vital signs in last 24 hours: Temp:  [97.7 F (36.5 C)-98.6 F (37 C)] 98.6 F (37 C) (09/29 0813) Pulse Rate:  [57-68] 59 (09/29 0813) Resp:  [15-18] 18 (09/29 0813) BP: (130-161)/(57-85) 146/85 mmHg (09/29 0813) SpO2:  [91 %-95 %] 94 % (09/29 0813) Weight:  [169 lb (76.658 kg)-170 lb 6.7 oz (77.3 kg)] 169 lb (76.658 kg) (09/29 0400)   General:   Alert,female,  Well-developed, well-nourished, pleasant and cooperative in NAD Head:  Normocephalic and atraumatic. Eyes:  Sclera clear, no icterus.   Conjunctiva pink. Ears:  Normal auditory acuity. Nose:  No deformity, discharge,  or lesions. Mouth:  No deformity or lesions.   Neck:  Supple; no masses or thyromegaly. Lungs:  Clear throughout to auscultation.   No wheezes, crackles, or rhonchi Heart:  Regular rate and rhythm; 2/6 murmur Abdomen:  Soft,mild epigastric and LUQ tenderness BS active,nonpalp mass or hsm.   Rectal:  Deferred  Msk:  Symmetrical without gross deformities. . Pulses:  Normal pulses noted. Extremities:  Without clubbing or edema. Neurologic:  Alert and  oriented x4;  grossly normal neurologically. Skin:  Intact without significant lesions or rashes.. Psych:  Alert and cooperative. Normal mood and affect.  Lab Results:  Recent Labs  10/21/13 0800 10/21/13 1936  WBC 6.9 7.0  HGB 13.0 11.9*  HCT 39.9 36.6  PLT 214 212   BMET  Recent Labs  10/21/13 0800 10/21/13 1936  NA 141  --   K 3.9  --   CL 105  --   CO2 22  --   GLUCOSE 160*  --   BUN 10  --   CREATININE 0.56 0.61  CALCIUM 9.4  --    LFT 10/22/13: AST 16 ALT  20 ALK PHOS 163  AMYLASE 9/28/1 480 LIPASE 10/21/13  81  PT/INR  Recent Labs  10/21/13 0800  LABPROT 15.5*  INR 1.23    Studies/Results: Dg Chest 2 View  10/21/2013   CLINICAL DATA:  Chest pain.  EXAM: CHEST  2 VIEW  COMPARISON:  09/10/2012  FINDINGS: Patient has had median sternotomy. A loop recorder overlies the anterior chest. Heart  is mildly enlarged. There are  no focal consolidations or pleural effusions. Minimal scarring is seen in the lingula. There are no focal consolidations or pleural effusions.  IMPRESSION: Cardiomegaly.  No evidence for acute pulmonary abnormality.   Electronically Signed   By: Shon Hale M.D.   On: 10/21/2013 08:25    PROCEDURES: Colonoscopy  Procedure date: 02/20/2007  Findings:  1) PAN-DIVERTICULOSIS  2) INTERNAL HEMORRHOIDS  3) OTHERWISE NORMAL     IMPRESSION/PLAN:  1. Atypical chest pain. Initial troponins neg. BNP at baseline. Discomfort may be due to pancreatitis, gallbladder disease, GERD, esophagitis.  2. Pancreatitis.  Lipase/amylase elvated.Transaminases normal. Alk phos elevated chronically. Abd ultrasound ordered.  Has been having increasednausea after eating.NPOIV fluids. Will repeat amylase/lipase today to see if trending down. 3.Odynophagia/dysphagia. Possibly due to esophagitis, GERD. Continue PPI. Will review with Dr Hilarie Fredrickson as to further recommendations.Will order CT abd /pelvis to eval pancreas, etc. Trial of carafate  Before meals and at bedtime  Stacey [name], Vita Barley PA-C 10/22/2013,  Pager [phone]

## 2013-10-22 NOTE — Progress Notes (Signed)
UR completed 

## 2013-10-22 NOTE — Progress Notes (Addendum)
       Patient Name: Stacey [name] Date of Encounter: 10/22/2013    SUBJECTIVE:Has nausea and increased symptoms when she eats. Denies dyspnea. There is a several day history of odynophagia.  Has continuous chest discomfort(pressure) since admission that worsens after eating. No dyspnea.  This AM also having abdominal discomfort that she feels is similar to diverticulitis.  TELEMETRY:  NSR Filed Vitals:   10/21/13 2000 10/22/13 0000 10/22/13 0400 10/22/13 0813  BP: 159/65 153/81 153/79 146/85  Pulse: 57 67 68 59  Temp: 98.5 F (36.9 C) 97.7 F (36.5 C) 98.6 F (37 C) 98.6 F (37 C)  TempSrc: Oral Oral Oral Oral  Resp: 18 18 18 18   Height:      Weight:   169 lb (76.658 kg)   SpO2: 95% 93% 93% 94%   No intake or output data in the 24 hours ending 10/22/13 0844 LABS: Basic Metabolic Panel:  Recent Labs  16/10/9607/28/15 0800 10/21/13 1936  NA 141  --   K 3.9  --   CL 105  --   CO2 22  --   GLUCOSE 160*  --   BUN 10  --   CREATININE 0.56 0.61  CALCIUM 9.4  --    Lipase     Component Value Date/Time   LIPASE 81* 10/21/2013 1936   Amylase    Component Value Date/Time   AMYLASE 480* 10/21/2013 1936     CBC:  Recent Labs  10/21/13 0800 10/21/13 1936  WBC 6.9 7.0  HGB 13.0 11.9*  HCT 39.9 36.6  MCV 76.6* 77.1*  PLT 214 212   Cardiac Enzymes:  Recent Labs  10/21/13 1936 10/22/13 0128 10/22/13 0737  TROPONINI <0.30 <0.30 <0.30   BNP    Component Value Date/Time   PROBNP 289.7* 10/21/2013 0800   Echocardiogram: 10/21/13 Study Conclusions  - Left ventricle: Wall thickness was increased in a pattern of severe LVH. There was moderate asymmetric hypertrophy. Systolic function was normal. The estimated ejection fraction was in the range of 55% to 60%. There was dynamic obstruction. Wall motion was normal; there were no regional wall motion abnormalities. Doppler parameters are consistent with abnormal left ventricular relaxation (grade 1  diastolic dysfunction). - Mitral valve: There was mild systolic anterior motion. There was moderate regurgitation. - Left atrium: The atrium was mildly dilated. - Right ventricle: The cavity size was mildly dilated. - Right atrium: The atrium was mildly dilated.  Impressions:  - Their is HOCM with resting LVOT gradient of 43 which increases to 172 mmHg with Valsalva.    Radiology/Studies:  CXR with no acute abnormality  Physical Exam: Blood pressure 146/85, pulse 59, temperature 98.6 F (37 C), temperature source Oral, resp. rate 18, height 5\' 9"  (1.753 m), weight 169 lb (76.658 kg), SpO2 94.00%. Weight change:   Wt Readings from Last 3 Encounters:  10/22/13 169 lb (76.658 kg)  10/07/13 175 lb (79.379 kg)  12/18/12 172 lb 1.9 oz (78.073 kg)    S4 gallop. 2/6 systolic murmur Chest is clear. Abdomen is non tender.  ASSESSMENT:  1. Acute pancreatitis 2. Possible Esophagitis 3. HOCM without CHF 4. Myocardial infarction excluded by markers. Unable to pull up ECG. Echo is unchanged.  Plan:  1. GI consult 2. NPO 3. No further cardiac w/u planned. 4. Check liver panel 5. Gall bladder U/S  Selinda EonSigned, SMITH III,HENRY W 10/22/2013, 8:44 AM

## 2013-10-22 NOTE — Consult Note (Signed)
Patient seen, examined, and I agree with the above documentation, including the assessment and plan. Unclear cause of the patient's symptoms, though she feels strongly this is diverticulitis based on her prior episode and similar presentation I am not convinced that she had pancreatitis, though she did have elevation in amylase and mild elevation in lipase, both of which have improved Agree with CT scan of the abdomen and pelvis which is pending, patient has completed oral contrast. The study will help better evaluate for any pancreatic inflammation but also diverticulitis She has had some regurgitation and dyspeptic symptoms along with odynophagia. Unclear etiology and it relates to the above. Agree with empiric PPI and trial of Carafate. If CT unrevealing may need EGD

## 2013-10-23 DIAGNOSIS — K5732 Diverticulitis of large intestine without perforation or abscess without bleeding: Secondary | ICD-10-CM

## 2013-10-23 LAB — COMPREHENSIVE METABOLIC PANEL
ALT: 17 U/L (ref 0–35)
AST: 16 U/L (ref 0–37)
Albumin: 3.4 g/dL — ABNORMAL LOW (ref 3.5–5.2)
Alkaline Phosphatase: 163 U/L — ABNORMAL HIGH (ref 39–117)
Anion gap: 17 — ABNORMAL HIGH (ref 5–15)
BUN: 8 mg/dL (ref 6–23)
CO2: 19 mEq/L (ref 19–32)
Calcium: 9.1 mg/dL (ref 8.4–10.5)
Chloride: 104 mEq/L (ref 96–112)
Creatinine, Ser: 0.56 mg/dL (ref 0.50–1.10)
GFR calc Af Amer: >90 mL/min (ref >90)
GFR calc non Af Amer: >90 mL/min (ref >90)
Glucose, Bld: 206 mg/dL — ABNORMAL HIGH (ref 70–99)
Potassium: 3.6 mEq/L — ABNORMAL LOW (ref 3.7–5.3)
Sodium: 140 mEq/L (ref 137–147)
Total Bilirubin: 0.3 mg/dL (ref 0.3–1.2)
Total Protein: 7.3 g/dL (ref 6.0–8.3)

## 2013-10-23 LAB — CBC
HCT: 39.2 % (ref 36.0–46.0)
Hemoglobin: 12.8 g/dL (ref 12.0–15.0)
MCH: 25.5 pg — ABNORMAL LOW (ref 26.0–34.0)
MCHC: 32.7 g/dL (ref 30.0–36.0)
MCV: 78.1 fL (ref 78.0–100.0)
Platelets: 231 10*3/uL (ref 150–400)
RBC: 5.02 MIL/uL (ref 3.87–5.11)
RDW: 14.6 % (ref 11.5–15.5)
WBC: 8.4 10*3/uL (ref 4.0–10.5)

## 2013-10-23 LAB — AMYLASE: Amylase: 51 U/L (ref 0–105)

## 2013-10-23 LAB — LIPASE, BLOOD: Lipase: 31 U/L (ref 11–59)

## 2013-10-23 LAB — GLUCOSE, CAPILLARY
Glucose-Capillary: 151 mg/dL — ABNORMAL HIGH (ref 70–99)
Glucose-Capillary: 150 mg/dL — ABNORMAL HIGH (ref 70–99)

## 2013-10-23 MED ORDER — INSULIN ASPART 100 UNIT/ML ~~LOC~~ SOLN
0.0000 [IU] | Freq: Three times a day (TID) | SUBCUTANEOUS | Status: DC
  Administered 2013-10-24: 1 [IU] via SUBCUTANEOUS
  Administered 2013-10-24 (×2): 2 [IU] via SUBCUTANEOUS
  Administered 2013-10-25: 1 [IU] via SUBCUTANEOUS
  Administered 2013-10-26: 2 [IU] via SUBCUTANEOUS

## 2013-10-23 MED ORDER — INSULIN ASPART 100 UNIT/ML ~~LOC~~ SOLN: 0.0000 [IU] | Freq: Every day | SUBCUTANEOUS | Status: DC

## 2013-10-23 NOTE — Progress Notes (Signed)
Agree with note. Will manage as instructed. Home when all comfortable.

## 2013-10-23 NOTE — Progress Notes (Signed)
[facility] Progress Note  Subjective:   Still with abd pain, but less than yesterday. Nauseous, no vomiting. No furthere pain swallowing. CT showed diverticulitis. Pancreatic enzymes trending down. No BRBPR. No melena. On cipro and flagyl IV. Had full liquids for breakfast--not sure if she wants to advance yet.   Objective:  Vital signs in last 24 hours: Temp:  [98.2 F (36.8 C)-98.9 F (37.2 C)] 98.7 F (37.1 C) (09/30 0500) Pulse Rate:  [55-70] 70 (09/30 0500) Resp:  [18] 18 (09/30 0500) BP: (146-174)/(62-74) 146/67 mmHg (09/30 0500) SpO2:  [93 %-94 %] 94 % (09/30 0500) Weight:  [169 lb 1.5 oz (76.7 kg)] 169 lb 1.5 oz (76.7 kg) (09/30 0500)   General:   Alert,  Well-developed,    in NAD Heart:  Regular rate and rhythm; 2/6 murmur Pulm;lungs clear to ausc bilat Abdomen:  Soft,tender left abdomen, nondistended. Normal bowel sounds, without guarding, and without rebound.   Extremities:  Without edema. Neurologic:  Alert and  oriented x4;  grossly normal neurologically. Psych:  Alert and cooperative. Normal mood and affect.  Intake/Output from previous day: 09/29 0701 - 09/30 0700 In: 1592.5 [P.O.:480; I.V.:812.5; IV Piggyback:300] Out: -  Intake/Output this shift: Total I/O In: 184.2 [I.V.:184.2] Out: -   Lab Results:  Recent Labs  10/21/13 0800 10/21/13 1936  WBC 6.9 7.0  HGB 13.0 11.9*  HCT 39.9 36.6  PLT 214 212   BMET  Recent Labs  10/21/13 0800 10/21/13 1936  NA 141  --   K 3.9  --   CL 105  --   CO2 22  --   GLUCOSE 160*  --   BUN 10  --   CREATININE 0.56 0.61  CALCIUM 9.4  --    LFT  Recent Labs  10/22/13 0737  PROT 7.1  ALBUMIN 3.7  AST 16  ALT 20  ALKPHOS 163*  BILITOT 0.3  BILIDIR <0.2  IBILI NOT CALCULATED   PT/INR  Recent Labs  10/21/13 0800  LABPROT 15.5*  INR 1.23   AMYLASE  127 on 9/29, 480 on 9/28 LIPASE 31 on 9/29, 81 on 9/28 Ct Abdomen Pelvis W Contrast  10/22/2013   CLINICAL DATA:  Pain.  EXAM:  CT ABDOMEN AND PELVIS WITH CONTRAST  TECHNIQUE: Multidetector CT imaging of the abdomen and pelvis was performed using the standard protocol following bolus administration of intravenous contrast.  CONTRAST:  100mL OMNIPAQUE IOHEXOL 300 MG/ML  SOLN  COMPARISON:  CT 11/21/2011.  FINDINGS: Diffuse fatty infiltration of the liver. Spleen normal. Splenosis. Pancreas unremarkable. No biliary distention. Rounded density over the dome of the gallbladder is most likely volume averaging with adjacent liver.  Adrenals normal. Kidneys normal. 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 No hydronephrosis or obstructing ureteral stone. The bladder is nondistended. Mild bladder wall thickening is present, this may be from nondistended state. Bladder wall pathology cannot be excluded. Hysterectomy. No free pelvic fluid.  No adenopathy. Aortoiliac atherosclerotic vascular disease. Visceral vessels are patent. Portal vein is patent.  Appendix normal. Sigmoid colonic diverticulosis noted. Very mild adjacent mesenteric fat streaking is present. Mild diverticulitis cannot be excluded. No evidence of obstruction. No evidence of abscess. Scattered diverticula are noted throughout the remaining of the colon. No small bowel distention. The stomach is nondistended. No free air. Right lateral lower abdominal wall hernia adjacent to the rectus muscle noted. Herniation of small bowel loops. No evidence of strangulation,  bowel wall edema, pneumatosis, or bowel obstruction. Small inguinal hernias with herniation of fat.  Basilar atelectasis and/or scarring. Cardiomegaly. No acute bony abnormality. Degenerative changes lumbar spine and both hips. Stable calcified density left proximal femur consistent with bone island.  IMPRESSION: 1. Diffuse fatty infiltration of liver. 2. 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 The aneurysm appears slightly smaller on today's exam compared to prior  CT of 11/21/2011 . 3. Mild bladder wall thickening. Bladder wall pathology including cystitis cannot be excluded. 4. Severe sigmoid colonic diverticulosis. Very mild sigmoid colonic diverticulitis cannot be excluded. 5. Right lateral lower abdominal wall hernia adjacent to the rectus muscle. This herniation small bowel. No evidence of obstruction or strangulation.   Electronically Signed   By: Maisie Fus  Register   On: 10/22/2013 18:12    ASSESSMENT/PLAN:  1. Atypical chest pain. No further pain since admitted. 2.Pancreatitis. Doubt pt had pancreatitis. Pain likely due to diverticulitis. Bladder thickening on CT may be secondary to diverticular inflammation as well. Continue IV cipro/flagyl. If tol full liquids at lunch will further advance.Will recheck cbc. 3.Odynophagia/dysphagia. Pt says pain much improved since on carafate. Will continue PPI and carafate.  LOS: 2 days   Deondrae Mcgrail, Tollie Pizza PA-C 10/23/2013, Pager [phone]

## 2013-10-23 NOTE — Progress Notes (Signed)
Patient seen, examined, and I agree with the above documentation, including the assessment and plan. CT suggestive of diverticulitis and given her previous presentations of diverticulitis, this is felt most likely, continue abx Odynophagia has improved, continue PPI and carafate for now Advance diet tomorrow as tolerated GI office followup recommended in 4-6 weeks with Dr. Leone PayorGessner, to consider repeat colonoscopy given several episodes of diverticulitis No evidence of pancreatitis

## 2013-10-23 NOTE — Progress Notes (Addendum)
Note history of diabetes per problem list.  Lab glucose=206 mg/dL this morning.  Consider checking CBG's and adding Novolog sensitive tid with meals and HS.  Paged Boyce MediciBrittany Simmons, PA to discuss. Orders received.  Thanks, Beryl MeagerJenny Jadis Pitter, RN, BC-ADM Inpatient Diabetes Coordinator Pager [phone]

## 2013-10-23 NOTE — Progress Notes (Signed)
Patient Profile: 63 yo BF with [facility] of H[facility], [facility] and h/o diverticular disease, presented to the ED 10/21/13 with complaints of atypical chest pain, dyspnea, and nausea.   Cardiac enzymes have been negative x 3.  Initial lipase and amylase were elevated and repeat labs showed improvement.  CT findings suggestive of diverticulosis/diverticulitis.  GI following.    Subjective: No further upper chest pain. Still with moderate to severe diffuse abdominal discomfort and tenderness but worse near the LLQ. She reports diarrhea. No hematochezia.   Objective: Vital signs in last 24 hours: Temp:  [98.2 F (36.8 C)-98.9 F (37.2 C)] 98.7 F (37.1 C) (09/30 0500) Pulse Rate:  [55-70] 70 (09/30 0500) Resp:  [18] 18 (09/30 0500) BP: (146-174)/(62-74) 146/67 mmHg (09/30 0500) SpO2:  [93 %-94 %] 94 % (09/30 0500) Weight:  [169 lb 1.5 oz (76.7 kg)] 169 lb 1.5 oz (76.7 kg) (09/30 0500)    Intake/Output from previous day: 09/29 0701 - 09/30 0700 In: 1592.5 [P.O.:480; I.V.:812.5; IV Piggyback:300] Out: -  Intake/Output this shift:    Medications Current Facility-Administered Medications  Medication Dose Route Frequency Provider Last Rate Last Dose  . 0.9 %  sodium chloride infusion   Intravenous Continuous Lori P Hvozdovic, PA-C 50 mL/hr at 10/22/13 1245    . acetaminophen (TYLENOL) tablet 650 mg  650 mg Oral Q4H PRN Peter M SwazilandJordan, MD      . atorvastatin (LIPITOR) tablet 10 mg  10 mg Oral Daily Peter M SwazilandJordan, MD   10 mg at 10/22/13 1845  . ciprofloxacin (CIPRO) IVPB 400 mg  400 mg Intravenous Q12H Beverley FiedlerJay M Pyrtle, MD   400 mg at 10/22/13 2335  . enoxaparin (LOVENOX) injection 40 mg  40 mg Subcutaneous Q24H Peter M SwazilandJordan, MD   40 mg at 10/22/13 1344  . Influenza vac split quadrivalent PF (FLUARIX) injection 0.5 mL  0.5 mL Intramuscular Tomorrow-1000 [facility] SalviaSteven C Klein, MD      . irbesartan Evlyn Kanner(AVAPRO) tablet 300 mg  300 mg  Oral QHS Peter M SwazilandJordan, MD   300 mg at 10/22/13 2113  . loratadine (CLARITIN) tablet 10 mg  10 mg Oral Daily Peter M SwazilandJordan, MD   10 mg at 10/22/13 1838  . metoprolol succinate (TOPROL-XL) 24 hr tablet 100 mg  100 mg Oral Daily Peter M SwazilandJordan, MD   100 mg at 10/22/13 1837  . metroNIDAZOLE (FLAGYL) IVPB 500 mg  500 mg Intravenous Q8H Beverley FiedlerJay M Pyrtle, MD   500 mg at 10/23/13 0630  . morphine 2 MG/ML injection 2 mg  2 mg Intravenous Q2H PRN Peter M SwazilandJordan, MD   2 mg at 10/21/13 1925  . ondansetron (ZOFRAN) injection 4 mg  4 mg Intravenous Q6H PRN Peter M SwazilandJordan, MD      . Oxcarbazepine (TRILEPTAL) tablet 600 mg  600 mg Oral BID Peter M SwazilandJordan, MD   600 mg at 10/22/13 2113  . pantoprazole (PROTONIX) EC tablet 40 mg  40 mg Oral Daily Peter M SwazilandJordan, MD   40 mg at 10/22/13 1839  . sucralfate (CARAFATE) 1 GM/10ML suspension 1 g  1 g Oral TID WC & HS Lori P Hvozdovic, PA-C   1 g at 10/23/13 0828  . topiramate (TOPAMAX) tablet 100 mg  100 mg Oral QHS Peter M SwazilandJordan, MD   100 mg at 10/22/13 2112    PE: General appearance: alert, cooperative and mild distress Neck: no carotid bruit and no  JVD Lungs: clear to auscultation bilaterally Heart: regular rate and rhythm and 2/6 SM best heard at the LUSB Extremities: no LEE Pulses: 2+ and symmetric Skin: Skin color, texture, turgor normal. No rashes or lesions or warm and dry Neurologic: Grossly normal  Lab Results:   Recent Labs  10/21/13 0800 10/21/13 1936  WBC 6.9 7.0  HGB 13.0 11.9*  HCT 39.9 36.6  PLT 214 212   BMET  Recent Labs  10/21/13 0800 10/21/13 1936  NA 141  --   K 3.9  --   CL 105  --   CO2 22  --   GLUCOSE 160*  --   BUN 10  --   CREATININE 0.56 0.61  CALCIUM 9.4  --    PT/INR  Recent Labs  10/21/13 0800  LABPROT 15.5*  INR 1.23   Cardiac Panel (last 3 results)  Recent Labs  10/21/13 1936 10/22/13 0128 10/22/13 0737  TROPONINI <0.30 <0.30 <0.30   Amylase    Component Value Date/Time   AMYLASE 127*  10/22/2013 1227   Lipase     Component Value Date/Time   LIPASE 31 10/22/2013 1227      Studies/Results:  CT-scan of abdomen and pelvis 10/22/13 IMPRESSION:  1. Diffuse fatty infiltration of liver.  2. 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 The aneurysm appears  slightly smaller on today's exam compared to prior CT of 11/21/2011  .  3. Mild bladder wall thickening. Bladder wall pathology including  cystitis cannot be excluded.  4. Severe sigmoid colonic diverticulosis. Very mild sigmoid colonic  diverticulitis cannot be excluded.  5. Right lateral lower abdominal wall hernia adjacent to the rectus  muscle. This herniation small bowel. No evidence of obstruction or  strangulation.    Assessment/Plan  Active Problems:   Hypertrophic obstructive cardiomyopathy(425.11)   Chronic diastolic heart failure   DIVERTICULOSIS-COLON   Chest pain syndrome   Pancreatitis   Odynophagia   Acute pancreatitis  1. Atypical Chest Pain: upper chest pain symptoms resolved. Unlikely cardiac given angiographically normal coronaries on [facility] 1 year ago and negative cardiac enzymes x 3.    2. Diverticulitis: Pt reports abdominal pain is consistent with past episodes. She continues to endorse LLQ pain and tenderness, diarrhea and no hematochezia. CT of the abdomen and pelvis yesterday also support the diagnosis with "severe sigmoid colonic diverticulosis. Very mild sigmoid colonic diverticulitis cannot not be excluded".  She is now on IV cipro and flagyl. GI following. Appreciate GI's assistance.   Dispo: no further cardiac w/u needed. Per GI, will likely require another day of IV antibiotics. Will d/c home once cleared by GI.     LOS: 2 days    Brittainy M. Sharol Harness, PA-C 10/23/2013 8:32 AM

## 2013-10-24 DIAGNOSIS — K859 Acute pancreatitis, unspecified: Secondary | ICD-10-CM

## 2013-10-24 DIAGNOSIS — K5732 Diverticulitis of large intestine without perforation or abscess without bleeding: Principal | ICD-10-CM

## 2013-10-24 LAB — GLUCOSE, CAPILLARY
Glucose-Capillary: 152 mg/dL — ABNORMAL HIGH (ref 70–99)
Glucose-Capillary: 153 mg/dL — ABNORMAL HIGH (ref 70–99)
Glucose-Capillary: 126 mg/dL — ABNORMAL HIGH (ref 70–99)
Glucose-Capillary: 178 mg/dL — ABNORMAL HIGH (ref 70–99)

## 2013-10-24 NOTE — Progress Notes (Signed)
Agree 

## 2013-10-24 NOTE — Progress Notes (Signed)
I agree with the above documentation, including the assessment and plan. Slow improvement, agree with changing to oral abx x 10 day.  Advanced diet If better still tomorrow, then anticipate discharge home Office followup with Dr. Leone PayorGessner is recommended Please call with questions

## 2013-10-24 NOTE — Progress Notes (Signed)
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  SUBJECTIVE: Still with diarrhea, trying to avoid dairy, but they keep sending it in various forms.  OBJECTIVE Filed Vitals:   10/23/13 0931 10/23/13 1348 10/23/13 2100 10/24/13 0649  BP: 143/66 147/72 164/77 157/79  Pulse: 70 65 62 72  Temp: 98.8 F (37.1 C) 98.8 F (37.1 C) 99.2 F (37.3 C) 98.6 F (37 C)  TempSrc: Oral Oral Oral Oral  Resp:  '18 18 18  '$ Height:      Weight:    166 lb 4.8 oz (75.433 kg)  SpO2:  96% 94% 94%    Intake/Output Summary (Last 24 hours) at 10/24/13 1007 Last data filed at 10/23/13 1502  Gross per 24 hour  Intake  317.5 ml  Output      0 ml  Net  317.5 ml   Filed Weights   10/22/13 0400 10/23/13 0500 10/24/13 0649  Weight: 169 lb (76.658 kg) 169 lb 1.5 oz (76.7 kg) 166 lb 4.8 oz (75.433 kg)    PHYSICAL EXAM General: Well developed, well nourished, female in no acute distress. Head: Normocephalic, atraumatic.  Neck: Supple without bruits, JVD 9 cm. Lungs:  Resp regular and unlabored, CTA. Heart: RRR, S1, S2, no S3, S4, 2/6 murmur; no rub. Abdomen: Soft, tender, distended, BS + x 4.  Extremities: No clubbing, cyanosis, no edema.  Neuro: Alert and oriented X 3. Moves all extremities spontaneously. Psych: Normal affect.  LABS: CBC:  Recent Labs  10/21/13 1936 10/23/13  WBC 7.0 8.4  HGB 11.9* 12.8  HCT 36.6 39.2  MCV 77.1* 78.1  PLT 212 [id number]   Basic Metabolic Panel:  Recent Labs  10/21/13 1936 10/23/13  NA  --  140  K  --  3.6*  CL  --  104  CO2  --  19    GLUCOSE  --  206*  BUN  --  8  CREATININE 0.61 0.56  CALCIUM  --  9.1   Liver Function Tests:  Recent Labs  10/22/13 0737 10/23/13  AST 16 16  ALT 20 17  ALKPHOS 163* 163*  BILITOT 0.3 0.3  PROT 7.1 7.3  ALBUMIN 3.7 3.4*   Cardiac Enzymes:  Recent Labs  10/21/13 1936 10/22/13 0128 10/22/13 0737  TROPONINI <0.30 <0.30 <0.30   BNP: Pro B Natriuretic peptide (BNP)  Date/Time Value Ref Range Status  10/21/2013  8:00 AM 289.7* 0 - 125 pg/mL Final  09/11/2012  6:15 AM 303.3* 0 - 125 pg/mL Final   D-dimer:  Recent Labs  10/21/13 1936  DDIMER 0.27   Lab Results  Component Value Date   LIPASE 31 10/23/2013   Lab Results  Component Value Date   AMYLASE 51 10/23/2013    TELE:  SR, ST, rare PVCs   Radiology/Studies: Ct Abdomen Pelvis W Contrast 10/22/2013   CLINICAL DATA:  Pain.  EXAM: CT ABDOMEN AND PELVIS WITH CONTRAST  TECHNIQUE: Multidetector CT imaging of the abdomen and pelvis was performed using the standard protocol following bolus administration of intravenous contrast.  CONTRAST:  151m OMNIPAQUE IOHEXOL 300 MG/ML  SOLN  COMPARISON:  CT 11/21/2011.  FINDINGS: Diffuse fatty infiltration of the liver. Spleen normal. Splenosis. Pancreas unremarkable. No biliary distention. Rounded density over the dome of the gallbladder is most likely volume averaging with adjacent liver.  Adrenals normal. Kidneys normal. 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 No hydronephrosis or obstructing ureteral stone. The bladder is nondistended. Mild bladder wall thickening is present, this may be from nondistended state. Bladder wall pathology cannot be excluded. Hysterectomy. No free pelvic fluid.  No adenopathy. Aortoiliac atherosclerotic vascular disease. Visceral vessels are patent. Portal vein is patent.  Appendix normal. Sigmoid colonic diverticulosis noted. Very mild adjacent mesenteric fat streaking is present. Mild  diverticulitis cannot be excluded. No evidence of obstruction. No evidence of abscess. Scattered diverticula are noted throughout the remaining of the colon. No small bowel distention. The stomach is nondistended. No free air. Right lateral lower abdominal wall hernia adjacent to the rectus muscle noted. Herniation of small bowel loops. No evidence of strangulation, bowel wall edema, pneumatosis, or bowel obstruction. Small inguinal hernias with herniation of fat.  Basilar atelectasis and/or scarring. Cardiomegaly. No acute bony abnormality. Degenerative changes lumbar spine and both hips. Stable calcified density left proximal femur consistent with bone island.  IMPRESSION: 1. Diffuse fatty infiltration of liver. 2. 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 The aneurysm appears slightly smaller on today's exam compared to prior CT of 11/21/2011 . 3. Mild bladder wall thickening. Bladder wall pathology including cystitis cannot be excluded. 4. Severe sigmoid colonic diverticulosis. Very mild sigmoid colonic diverticulitis cannot be excluded. 5. Right lateral lower abdominal wall hernia adjacent to the rectus muscle. This herniation small bowel. No evidence of obstruction or strangulation.   Electronically Signed   By: Marcello Moores  Register   On: 10/22/2013 18:12    Current Medications:  . atorvastatin  10 mg Oral Daily  . ciprofloxacin  400 mg Intravenous Q12H  . enoxaparin (LOVENOX) injection  40 mg Subcutaneous Q24H  . insulin aspart  0-5 Units Subcutaneous QHS  . insulin aspart  0-9 Units Subcutaneous TID WC  . irbesartan  300 mg Oral QHS  . loratadine  10 mg Oral Daily  . metoprolol succinate  100 mg Oral Daily  . metronidazole  500 mg Intravenous Q8H  . oxcarbazepine  600 mg Oral BID  . pantoprazole  40 mg Oral Daily  . sucralfate  1 g Oral TID WC & HS  . topiramate  100 mg Oral QHS   . sodium chloride 50 mL/hr at 10/22/13 1245    ASSESSMENT AND PLAN: Principal Problem:   Diverticulosis of  large intestine - mgt per GI, improving but not well yet. PO intake is poor and not tolerate well, but she is starting to get hungry.  Active Problems:   Hypertrophic obstructive cardiomyopathy(425.11) - weight is stable/decreasing, follow.    Chronic diastolic heart failure - see above    Chest pain syndrome - upper chest pain symptoms resolved. Unlikely cardiac given angiographically normal coronaries on [facility] 1 year ago and negative cardiac enzymes x 3. No further eval planned    Pancreatitis - amylase and lipase have normalized, per GI    Odynophagia - improving    Acute pancreatitis - per GI  Plan - d/c when GI stable.  Jonetta Speak , PA-C 10:07 AM 10/24/2013

## 2013-10-24 NOTE — Progress Notes (Signed)
[facility] Progress Note  Subjective:  Less pain. Tol liquids. Still with some loose stools. No BRBPR or melena. No nausea or vomiitng.   Objective:  Vital signs in last 24 hours: Temp:  [98.6 F (37 C)-99.2 F (37.3 C)] 98.6 F (37 C) (10/01 0649) Pulse Rate:  [62-72] 72 (10/01 0649) Resp:  [18] 18 (10/01 0649) BP: (143-164)/(66-79) 157/79 mmHg (10/01 0649) SpO2:  [94 %-96 %] 94 % (10/01 0649) Weight:  [166 lb 4.8 oz (75.433 kg)] 166 lb 4.8 oz (75.433 kg) (10/01 0649) Last BM Date: 10/23/13 General:   Alert,  Well-developed,    in NAD Heart:  Regular rate and rhythm; no murmurs Pulm;lungs clear to ausc bilat Abdomen:  Soft,mild left sided tenderness, nondistended. Normal bowel sounds, without guarding, and without rebound.   Extremities:  Without edema. Neurologic:  Alert and  oriented x4;  grossly normal neurologically. Psych:  Alert and cooperative. Normal mood and affect.  Intake/Output from previous day: 09/30 0701 - 10/01 0700 In: 501.7 [I.V.:501.7] Out: -  Intake/Output this shift:    Lab Results:  Recent Labs  10/21/13 1936 10/23/13  WBC 7.0 8.4  HGB 11.9* 12.8  HCT 36.6 39.2  PLT 212 231   BMET  Recent Labs  10/21/13 1936 10/23/13  NA  --  140  K  --  3.6*  CL  --  104  CO2  --  19  GLUCOSE  --  206*  BUN  --  8  CREATININE 0.61 0.56  CALCIUM  --  9.1   LFT  Recent Labs  10/22/13 0737 10/23/13  PROT 7.1 7.3  ALBUMIN 3.7 3.4*  AST 16 16  ALT 20 17  ALKPHOS 163* 163*  BILITOT 0.3 0.3  BILIDIR <0.2  --   IBILI NOT CALCULATED  --    Ct Abdomen Pelvis W Contrast  10/22/2013   CLINICAL DATA:  Pain.  EXAM: CT ABDOMEN AND PELVIS WITH CONTRAST  TECHNIQUE: Multidetector CT imaging of the abdomen and pelvis was performed using the standard protocol following bolus administration of intravenous contrast.  CONTRAST:  163m OMNIPAQUE IOHEXOL 300 MG/ML  SOLN  COMPARISON:  CT 11/21/2011.  FINDINGS: Diffuse fatty infiltration of the  liver. Spleen normal. Splenosis. Pancreas unremarkable. No biliary distention. Rounded density over the dome of the gallbladder is most likely volume averaging with adjacent liver.  Adrenals normal. Kidneys normal. 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 No hydronephrosis or obstructing ureteral stone. The bladder is nondistended. Mild bladder wall thickening is present, this may be from nondistended state. Bladder wall pathology cannot be excluded. Hysterectomy. No free pelvic fluid.  No adenopathy. Aortoiliac atherosclerotic vascular disease. Visceral vessels are patent. Portal vein is patent.  Appendix normal. Sigmoid colonic diverticulosis noted. Very mild adjacent mesenteric fat streaking is present. Mild diverticulitis cannot be excluded. No evidence of obstruction. No evidence of abscess. Scattered diverticula are noted throughout the remaining of the colon. No small bowel distention. The stomach is nondistended. No free air. Right lateral lower abdominal wall hernia adjacent to the rectus muscle noted. Herniation of small bowel loops. No evidence of strangulation, bowel wall edema, pneumatosis, or bowel obstruction. Small inguinal hernias with herniation of fat.  Basilar atelectasis and/or scarring. Cardiomegaly. No acute bony abnormality. Degenerative changes lumbar spine and both hips. Stable calcified density left proximal femur consistent with bone island.  IMPRESSION: 1. Diffuse fatty infiltration of liver. 2. 12 mm  right renal artery calcified aneurysm. The aneurysm appears slightly smaller on today's exam compared to prior CT of 11/21/2011 . 3. Mild bladder wall thickening. Bladder wall pathology including cystitis cannot be excluded. 4. Severe sigmoid colonic diverticulosis. Very mild sigmoid colonic diverticulitis cannot be excluded. 5. Right lateral lower abdominal wall hernia adjacent to the rectus muscle. This herniation small bowel. No  evidence of obstruction or strangulation.   Electronically Signed   By: Marcello Moores  Register   On: 10/22/2013 18:12    ASSESSMENT/PLAN:  1. Diverticulitis. Cont IV abx. Will advance to soft diet.If tol soft diet wuill change abx to po.Change IV to [facility]. 2. Odynophagia--improved with carafate. 3. Pancreatitis--pancreatic enzymes have improved.     LOS: 3 days   Aleea Hendry, Vita Barley PA-C 10/24/2013, Pager [phone]

## 2013-10-25 ENCOUNTER — Other Ambulatory Visit: Payer: Self-pay | Admitting: Internal Medicine

## 2013-10-25 DIAGNOSIS — R7989 Other specified abnormal findings of blood chemistry: Secondary | ICD-10-CM

## 2013-10-25 LAB — CBC
HCT: 39.7 % (ref 36.0–46.0)
Hemoglobin: 12.8 g/dL (ref 12.0–15.0)
MCH: 25.2 pg — ABNORMAL LOW (ref 26.0–34.0)
MCHC: 32.2 g/dL (ref 30.0–36.0)
MCV: 78.3 fL (ref 78.0–100.0)
Platelets: 216 10*3/uL (ref 150–400)
RBC: 5.07 MIL/uL (ref 3.87–5.11)
RDW: 14.4 % (ref 11.5–15.5)
WBC: 7.9 10*3/uL (ref 4.0–10.5)

## 2013-10-25 LAB — BASIC METABOLIC PANEL
Anion gap: 13 (ref 5–15)
BUN: 12 mg/dL (ref 6–23)
CO2: 23 mEq/L (ref 19–32)
Calcium: 9 mg/dL (ref 8.4–10.5)
Chloride: 103 mEq/L (ref 96–112)
Creatinine, Ser: 0.88 mg/dL (ref 0.50–1.10)
GFR calc Af Amer: 80 mL/min — ABNORMAL LOW (ref >90)
GFR calc non Af Amer: 69 mL/min — ABNORMAL LOW (ref >90)
Glucose, Bld: 205 mg/dL — ABNORMAL HIGH (ref 70–99)
Potassium: 4 mEq/L (ref 3.7–5.3)
Sodium: 139 mEq/L (ref 137–147)

## 2013-10-25 LAB — GLUCOSE, CAPILLARY
Glucose-Capillary: 148 mg/dL — ABNORMAL HIGH (ref 70–99)
Glucose-Capillary: 124 mg/dL — ABNORMAL HIGH (ref 70–99)
Glucose-Capillary: 123 mg/dL — ABNORMAL HIGH (ref 70–99)
Glucose-Capillary: 164 mg/dL — ABNORMAL HIGH (ref 70–99)

## 2013-10-25 NOTE — Consult Note (Signed)
[facility] History and Physical  Cheyene Hamric Crawford-Fewell VOH:[id number] DOB: February 18, 1950 DOA: 10/21/2013  Referring physician: Dr Caryl Comes.  PCP: Cathlean Cower, MD   Chief Complaint: persistent , nausea, diverticulitis.   HPI: GENISIS SONNIER is a 63 y.o. female with PMH significant for history of HOCM s/p septal myectomy who presents to the ED on 9-28 with complaints of chest pain, dyspnea, and nausea. She reports a 2 day history of chest pressure radiating to her left shoulder. It is constant with variable severity. She also has associated dyspnea with mild orthopnea. She was evaluated by cardiology who thought her chest pain was GI nature. She had a CT abdomen pelvis that showed severe sigmoid colonic diverticulosis. Very mild sigmoid colonic  diverticulitis cannot be excluded. Patient also with abdominal pain, lower quadrant, nausea and vomiting. She was evaluated by GI who think patient presentation is secondary to diverticulitis. Patient was started on Cipro and flagyl.  Patient was suppose to be discharge today but she develops worsening nausea. [facility] was consulted.  Patient seen and examined. Patient eating dinner. She relates abdominal pain is better, now 3/10, diarrhea has resolved. She develops worsening nausea today after breakfast. No vomiting. Nausea is better with medications.      Review of Systems:  Negative, except as per HPI.   Past Medical History  Diagnosis Date  . HYPERLIPIDEMIA 08/01/2006  . OBESITY 09/16/2006  . SICKLE CELL ANEMIA 08/01/2006  . ANXIETY 09/16/2006  . DEPRESSION 09/16/2006  . HYPERTENSION 08/01/2006  . 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 s/p septal myomectomy 4/12 at [facility] with Dr. Evelina Dun;  b. echo 5/12: EF 60-65%, LVOT peak 18 mmHg; grade 1 diast dysfxn, mild SAM (improved since myomectomy;  c. 03/2012 Echo: EF 60-65%, mod-sev basal septal asymm hypertrophy, basal septal HK, LVOT grad 27mHg, Gr 1 DD, , SAM, Mild MR, nl RV,  PASP 350mg; 08/2012 Echo: technically difficult, doubt LVOT obstruction, EF 60%, Gr 1 DD, mod-sev dil LA.  . BRADYCARDIA 10/13/2009  . ALLERGIC RHINITIS 12/07/2006  . ASTHMA 08/01/2006  . VENTRAL HERNIA 12/07/2006  . ANGIOEDEMA 03/07/2008  . ABDOMINAL WALL CONTUSION 02/18/2010  . Wheezing 04/01/2010  . DIVERTICULOSIS, COLON, WITH HEMORRHAGE 04/06/2010  . RECTAL BLEEDING 04/06/2010  . CAD (coronary artery disease)     a. 01/2010 : Minimal plaque at cardiac catheterization - CFX 20%, EF 70%;  b. 08/2012 Cath: Nl Cors, EF 65%.  . Marland KitchenBBB (left bundle branch block) 06/17/2010  . Chronic diastolic CHF (congestive heart failure) 04/06/2010    a. In setting of HOCM.  . Marland Kitchenmpaired glucose tolerance 06/25/2010  . DM (diabetes mellitus) in pregnancy, delivered w/postpartum condition 08/30/2010  . Type II or unspecified type diabetes mellitus without mention of complication, uncontrolled 08/30/2010  . COPD (chronic obstructive pulmonary disease)   . Frequent PVCs     a. Medtronic LINQ Reveal Loop Recorder  Serial Number  [id number]    Past Surgical History  Procedure Laterality Date  . Abdominal hysterectomy  1987  . Ventral hernia repair  06/2006  . Myomectomy      Septal  . Left heart cath  Aug. 18, 2014    Medtronic heart device   Social History:  reports that she has quit smoking. She has never used smokeless tobacco. She reports that she does not drink alcohol or use illicit drugs.  Allergies  Allergen Reactions  . Dairy Aid [Lactase] Diarrhea and Other (See Comments)    Bloating and gastric distress  . Ace Inhibitors  Other (See Comments)    REACTION: angioedema right eye  . Aspirin Other (See Comments)    Bleeding   . Codeine Other (See Comments)    Hallucinations, can take if with someone.  . Soy Allergy Other (See Comments)    Bleeding & cramps    Family History  Problem Relation Age of Onset  . Cancer Daughter     colon cancer and bleeding disorder  . Hyperlipidemia Daughter   . Heart  disease Daughter   . Heart disease Father   . Heart attack Father   . Heart attack Sister   . Hyperlipidemia Sister   . Heart disease Sister   . Hyperlipidemia Brother      Prior to Admission medications   Medication Sig Start Date End Date Taking? Authorizing Provider  atorvastatin (LIPITOR) 10 MG tablet Take 10 mg by mouth daily.   Yes Historical Provider, MD  cetirizine (ZYRTEC) 10 MG tablet Take 10 mg by mouth daily.     Yes Historical Provider, MD  fluticasone (FLONASE) 50 MCG/ACT nasal spray Place 2 sprays into the nose daily. 01/17/12  Yes Jearld Fenton, NP  irbesartan (AVAPRO) 300 MG tablet Take 300 mg by mouth at bedtime.   Yes Historical Provider, MD  metoprolol succinate (TOPROL-XL) 100 MG 24 hr tablet Take 100 mg by mouth daily. Take with or immediately following a meal.   Yes Historical Provider, MD  oxcarbazepine (TRILEPTAL) 600 MG tablet Take 600 mg by mouth 2 (two) times daily.   Yes Historical Provider, MD  potassium chloride SA (K-DUR,KLOR-CON) 20 MEQ tablet Take 20 mEq by mouth daily as needed. For low potassium   Yes Historical Provider, MD  topiramate (TOPAMAX) 200 MG tablet Take 100 mg by mouth at bedtime. Tapering off currently on 100 mg then will reduce to 50 mg then reduce to 25 mg   Yes Historical Provider, MD  amoxicillin (AMOXIL) 500 MG capsule Take 1 capsule (500 mg total) by mouth 3 (three) times daily. 10/07/13   Hendricks Limes, MD  atorvastatin (LIPITOR) 10 MG tablet TAKE 1 TABLET BY MOUTH EVERY DAY 10/25/13   Biagio Borg, MD   Physical Exam: Filed Vitals:   10/24/13 1422 10/24/13 2031 10/25/13 0500 10/25/13 1350  BP: 148/72 158/76 148/67 136/69  Pulse: 70 62 63 59  Temp: 98.6 F (37 C) 99 F (37.2 C) 98.5 F (36.9 C) 98.3 F (36.8 C)  TempSrc: Oral Oral Oral Oral  Resp: _0 Height:      Weight:   75.479 kg (166 lb 6.4 oz)   SpO2: 98% 94% 93% 94%    Wt Readings from Last 3 Encounters:  10/25/13 75.479 kg (166 lb 6.4 oz)  10/07/13  79.379 kg (175 lb)  12/18/12 78.073 kg (172 lb 1.9 oz)    General:  Appears calm and comfortable Eyes: PERRL, normal lids, irises & conjunctiva ENT: grossly normal hearing, lips & tongue Neck: no LAD, masses or thyromegaly Cardiovascular: RRR, no m/r/g. No LE edema. Telemetry: SR, no arrhythmias  Respiratory: CTA bilaterally, no w/r/r. Normal respiratory effort. Abdomen: soft, mild tenderness lower quadrant.  Skin: no rash or induration seen on limited exam Musculoskeletal: grossly normal tone BUE/BLE Psychiatric: grossly normal mood and affect, speech fluent and appropriate Neurologic: grossly non-focal.          Labs on Admission:  Basic Metabolic Panel:  Recent Labs Lab 10/21/13 0800 10/21/13 1936 10/23/13  NA 141  --  140  K 3.9  --  3.6*  CL 105  --  104  CO2 22  --  19  GLUCOSE 160*  --  206*  BUN 10  --  8  CREATININE 0.56 0.61 0.56  CALCIUM 9.4  --  9.1   Liver Function Tests:  Recent Labs Lab 10/22/13 0737 10/23/13  AST 16 16  ALT 20 17  ALKPHOS 163* 163*  BILITOT 0.3 0.3  PROT 7.1 7.3  ALBUMIN 3.7 3.4*    Recent Labs Lab 10/21/13 1936 10/22/13 1227 10/23/13  LIPASE 81* 31 31  AMYLASE 480* 127* 51   No results found for this basename: AMMONIA,  in the last 168 hours CBC:  Recent Labs Lab 10/21/13 0800 10/21/13 1936 10/23/13 10/25/13 0500  WBC 6.9 7.0 8.4 7.9  HGB 13.0 11.9* 12.8 12.8  HCT 39.9 36.6 39.2 39.7  MCV 76.6* 77.1* 78.1 78.3  PLT 214 212 231 216   Cardiac Enzymes:  Recent Labs Lab 10/21/13 0800 10/21/13 1936 10/22/13 0128 10/22/13 0737  TROPONINI <0.30 <0.30 <0.30 <0.30    BNP (last 3 results)  Recent Labs  10/21/13 0800  PROBNP 289.7*   CBG:  Recent Labs Lab 10/24/13 1659 10/24/13 2231 10/25/13 0741 10/25/13 1152 10/25/13 1653  GLUCAP 126* 178* 148* 124* 123*    Radiological Exams on Admission: No results found.  Assessment/Plan Principal Problem:   Diverticulosis of large intestine Active  Problems:   Hypertrophic obstructive cardiomyopathy(425.11)   Chronic diastolic heart failure   Chest pain syndrome   Pancreatitis   Odynophagia   Acute pancreatitis   1-Diverticulitis; patient overall symptoms has improved. Complaining of worsening nausea. Continue with Flagyl and cipro. Will need to transition to oral. Zofran PRN for nausea. Will repeat LFT, B-met, lipase. GI to see patient again in am.   2-Chest pain; no further evaluation per cardio.   3-Odynophagia; improved on Carafate.   Hypertrophic obstructive cardiomyopathy(425.11); compensated.  Chronic diastolic heart failure  Pancreatitis--pancreatic enzymes have improved  Code Status: full code.  DVT Prophylaxis: SCD. Lovenox.  Family Communication: Care discussed with patient.    Time spent: 75 minutes.   Niel Hummer A [facility] Pager [phone]

## 2013-10-25 NOTE — Progress Notes (Signed)
Utilization review completed.  

## 2013-10-25 NOTE — Progress Notes (Addendum)
    Patient was supposed to go home today per GI. But feeling more nauseated today than she was yesterday. Not ready to go home. Need to have GI consult again tomorrow or call IM and have them take on their service. No further cardiac work up planned.   Cline CrockKathryn Trudie Cervantes PA-C  [facility]

## 2013-10-25 NOTE — Progress Notes (Signed)
Since this is all GI, will ask that GI manage and discharge when they are done.

## 2013-10-26 DIAGNOSIS — R110 Nausea: Secondary | ICD-10-CM

## 2013-10-26 DIAGNOSIS — R079 Chest pain, unspecified: Secondary | ICD-10-CM

## 2013-10-26 DIAGNOSIS — R0789 Other chest pain: Secondary | ICD-10-CM

## 2013-10-26 LAB — GLUCOSE, CAPILLARY: Glucose-Capillary: 137 mg/dL — ABNORMAL HIGH (ref 70–99)

## 2013-10-26 LAB — COMPREHENSIVE METABOLIC PANEL
ALT: 16 U/L (ref 0–35)
AST: 17 U/L (ref 0–37)
Albumin: 3.2 g/dL — ABNORMAL LOW (ref 3.5–5.2)
Alkaline Phosphatase: 132 U/L — ABNORMAL HIGH (ref 39–117)
Anion gap: 11 (ref 5–15)
BUN: 11 mg/dL (ref 6–23)
CO2: 24 mEq/L (ref 19–32)
Calcium: 9 mg/dL (ref 8.4–10.5)
Chloride: 105 mEq/L (ref 96–112)
Creatinine, Ser: 0.79 mg/dL (ref 0.50–1.10)
GFR calc Af Amer: >90 mL/min (ref >90)
GFR calc non Af Amer: 87 mL/min — ABNORMAL LOW (ref >90)
Glucose, Bld: 144 mg/dL — ABNORMAL HIGH (ref 70–99)
Potassium: 3.7 mEq/L (ref 3.7–5.3)
Sodium: 140 mEq/L (ref 137–147)
Total Bilirubin: 0.3 mg/dL (ref 0.3–1.2)
Total Protein: 6.7 g/dL (ref 6.0–8.3)

## 2013-10-26 LAB — LIPASE, BLOOD: Lipase: 25 U/L (ref 11–59)

## 2013-10-26 MED ORDER — CIPROFLOXACIN HCL 500 MG PO TABS: 500.0000 mg | ORAL_TABLET | Freq: Two times a day (BID) | ORAL | Status: DC

## 2013-10-26 MED ORDER — CIPROFLOXACIN HCL 500 MG PO TABS
500.0000 mg | ORAL_TABLET | Freq: Two times a day (BID) | ORAL | Status: DC
  Administered 2013-10-26: 500 mg via ORAL
  Filled 2013-10-26 (×3): qty 1

## 2013-10-26 MED ORDER — ONDANSETRON HCL 4 MG PO TABS: 4.0000 mg | ORAL_TABLET | Freq: Three times a day (TID) | ORAL | Status: DC | PRN

## 2013-10-26 MED ORDER — METRONIDAZOLE 500 MG PO TABS
500.0000 mg | ORAL_TABLET | Freq: Three times a day (TID) | ORAL | Status: DC
  Administered 2013-10-26: 500 mg via ORAL
  Filled 2013-10-26 (×4): qty 1

## 2013-10-26 MED ORDER — SUCRALFATE 1 GM/10ML PO SUSP: 1.0000 g | Freq: Three times a day (TID) | ORAL | Status: DC

## 2013-10-26 MED ORDER — METRONIDAZOLE 500 MG PO TABS: 500.0000 mg | ORAL_TABLET | Freq: Three times a day (TID) | ORAL | Status: DC

## 2013-10-26 MED ORDER — PANTOPRAZOLE SODIUM 40 MG PO TBEC: 40.0000 mg | DELAYED_RELEASE_TABLET | Freq: Every day | ORAL | Status: DC

## 2013-10-26 NOTE — Discharge Summary (Signed)
Physician Discharge Summary  Stacey [name] WJX:[id number] DOB: 1950/03/02 DOA: 10/21/2013  PCP: Oliver Barre, MD  Admit date: 10/21/2013 Discharge date: 10/26/2013  Time spent: 35 minutes  Recommendations for Outpatient Follow-up:  1. Follow up with Dr Leone Payor for diverticulitis.   Discharge Diagnoses:    Diverticulitis.    Hypertrophic obstructive cardiomyopathy(425.11)   Chronic diastolic heart failure   Chest pain syndrome   Pancreatitis   Odynophagia   Acute pancreatitis   Discharge Condition: stable.   Diet recommendation: soft diet.   Filed Weights   10/24/13 0649 10/25/13 0500 10/26/13 0459  Weight: 75.433 kg (166 lb 4.8 oz) 75.479 kg (166 lb 6.4 oz) 75.887 kg (167 lb 4.8 oz)    History of present illness:  Stacey [name] 63 yo BF with history of HOCM s/p septal myectomy presents to the ED today with complaints of chest pain, dyspnea, and nausea. She reports a 2 day history of chest pressure radiating to her left shoulder. It is constant with variable severity. She also has associated dyspnea with mild orthopnea. This am she developed acute nausea without vomiting and felt like the chest pressure is worse so she presented to the ED. She was treated for acute bronchitis 2 weeks ago with a one week course of amoxicillin. She still has a mild nonproductive cough. She has occasional loose stools but states this has not changed. No fever or chills. She is s/p septal myectomy in 4/12 at [facility] for HOCM. She was admitted in August 2014 with palpitations, presyncope and mildly elevated POC troponin. Cardiac cath showed normal coronary anatomy. Echo showed no LVOT obstruction and normal EF. An implantable loop recorder was placed and to date she has had no significant arrhythmia.    Hospital Course:  Stacey [name] is a 63 y.o. female with PMH significant for history of HOCM s/p septal myectomy who presents to the ED on 9-28 with complaints of chest pain, dyspnea, and  nausea. She reports a 2 day history of chest pressure radiating to her left shoulder. It is constant with variable severity. She also has associated dyspnea with mild orthopnea. She was evaluated by cardiology who thought her chest pain was GI nature. She had a CT abdomen pelvis that showed severe sigmoid colonic diverticulosis. Very mild sigmoid colonic  diverticulitis cannot be excluded. Patient also with abdominal pain, lower quadrant, nausea and vomiting. She was evaluated by GI who think patient presentation is secondary to diverticulitis. Patient was started on Cipro and flagyl.  Patient was suppose to be discharge today but she develops worsening nausea. [facility] was consulted.  Patient seen and examined. Patient eating dinner. She relates abdominal pain is better, now 3/10, diarrhea has resolved. She develops worsening nausea today after breakfast. No vomiting. Nausea is better with medications.   patient feeling better, nausea improved. Was able to tolerates oral antibiotics. Plan to discharge today. Close follow up with GI.   1-Diverticulitis; patient overall symptoms has improved. Complaining of worsening nausea. Continue with Flagyl and cipro. Marland Kitchen Zofran PRN for nausea. LFT stable, lipase normal.  2-Chest pain; no further evaluation per cardio.  3-Odynophagia; improved on Carafate.  Hypertrophic obstructive cardiomyopathy(425.11); compensated.  Chronic diastolic heart failure  Pancreatitis--pancreatic enzymes have improved  Procedures:  none  Consultations:  Cardiology  Gastroenterologist.   Discharge Exam: Filed Vitals:   10/26/13 0459  BP: 143/57  Pulse: 57  Temp: 98.6 F (37 C)  Resp: 18    General: Alert in no distress.  Cardiovascular: S 1, S  2 RRR Respiratory: CTA  Discharge Instructions You were cared for by a hospitalist during your hospital stay. If you have any questions about your discharge medications or the care you received while you were in the hospital  after you are discharged, you can call the unit and asked to speak with the hospitalist on call if the hospitalist that took care of you is not available. Once you are discharged, your primary care physician will handle any further medical issues. Please note that NO REFILLS for any discharge medications will be authorized once you are discharged, as it is imperative that you return to your primary care physician (or establish a relationship with a primary care physician if you do not have one) for your aftercare needs so that they can reassess your need for medications and monitor your lab values.  Discharge Instructions   Diet - low sodium heart healthy    Complete by:  As directed      Increase activity slowly    Complete by:  As directed           Current Discharge Medication List    START taking these medications   Details  ciprofloxacin (CIPRO) 500 MG tablet Take 1 tablet (500 mg total) by mouth 2 (two) times daily. Qty: 10 tablet, Refills: 0    metroNIDAZOLE (FLAGYL) 500 MG tablet Take 1 tablet (500 mg total) by mouth every 8 (eight) hours. Qty: 15 tablet, Refills: 0    ondansetron (ZOFRAN) 4 MG tablet Take 1 tablet (4 mg total) by mouth every 8 (eight) hours as needed for nausea or vomiting. Qty: 20 tablet, Refills: 0    pantoprazole (PROTONIX) 40 MG tablet Take 1 tablet (40 mg total) by mouth daily. Qty: 30 tablet, Refills: 0    sucralfate (CARAFATE) 1 GM/10ML suspension Take 10 mLs (1 g total) by mouth 4 (four) times daily -  with meals and at bedtime. Qty: 420 mL, Refills: 0      CONTINUE these medications which have NOT CHANGED   Details  atorvastatin (LIPITOR) 10 MG tablet Take 10 mg by mouth daily.    cetirizine (ZYRTEC) 10 MG tablet Take 10 mg by mouth daily.      fluticasone (FLONASE) 50 MCG/ACT nasal spray Place 2 sprays into the nose daily. Qty: 16 g, Refills: 6   Associated Diagnoses: Sinusitis    irbesartan (AVAPRO) 300 MG tablet Take 300 mg by mouth at  bedtime.    metoprolol succinate (TOPROL-XL) 100 MG 24 hr tablet Take 100 mg by mouth daily. Take with or immediately following a meal.    oxcarbazepine (TRILEPTAL) 600 MG tablet Take 600 mg by mouth 2 (two) times daily.    potassium chloride SA (K-DUR,KLOR-CON) 20 MEQ tablet Take 20 mEq by mouth daily as needed. For low potassium    topiramate (TOPAMAX) 200 MG tablet Take 100 mg by mouth at bedtime. Tapering off currently on 100 mg then will reduce to 50 mg then reduce to 25 mg      STOP taking these medications     amoxicillin (AMOXIL) 500 MG capsule        Allergies  Allergen Reactions  . Dairy Aid [Lactase] Diarrhea and Other (See Comments)    Bloating and gastric distress  . Ace Inhibitors Other (See Comments)    REACTION: angioedema right eye  . Aspirin Other (See Comments)    Bleeding   . Codeine Other (See Comments)    Hallucinations, can take if with someone.  .Marland Kitchen  Soy Allergy Other (See Comments)    Bleeding & cramps   Follow-up Information   Follow up with Oliver Barre, MD.   Specialties:  Internal Medicine, Radiology   Contact information:   [address] [place] [address] [phone]       Follow up with Stan Head, MD In 1 week.   Specialty:  Gastroenterology   Contact information:   [address] [place] [address] [phone]        The results of significant diagnostics from this hospitalization (including imaging, microbiology, ancillary and laboratory) are listed below for reference.    Significant Diagnostic Studies: Dg Chest 2 View  10/21/2013   CLINICAL DATA:  Chest pain.  EXAM: CHEST  2 VIEW  COMPARISON:  09/10/2012  FINDINGS: Patient has had median sternotomy. A loop recorder overlies the anterior chest. Heart is mildly enlarged. There are no focal consolidations or pleural effusions. Minimal scarring is seen in the lingula. There are no focal consolidations or pleural effusions.  IMPRESSION: Cardiomegaly.  No evidence for  acute pulmonary abnormality.   Electronically Signed   By: Rosalie Gums M.D.   On: 10/21/2013 08:25   Ct Abdomen Pelvis W Contrast  10/22/2013   CLINICAL DATA:  Pain.  EXAM: CT ABDOMEN AND PELVIS WITH CONTRAST  TECHNIQUE: Multidetector CT imaging of the abdomen and pelvis was performed using the standard protocol following bolus administration of intravenous contrast.  CONTRAST:  OMNIPAQUE IOHEXOL 300 MG/ML  SOLN  COMPARISON:  CT 11/21/2011.  FINDINGS: Diffuse fatty infiltration of the liver. Spleen normal. Splenosis. Pancreas unremarkable. No biliary distention. Rounded density over the dome of the gallbladder is most likely volume averaging with adjacent liver.  Adrenals normal. Kidneys normal. 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 No hydronephrosis or obstructing ureteral stone. The bladder is nondistended. Mild bladder wall thickening is present, this may be from nondistended state. Bladder wall pathology cannot be excluded. Hysterectomy. No free pelvic fluid.  No adenopathy. Aortoiliac atherosclerotic vascular disease. Visceral vessels are patent. Portal vein is patent.  Appendix normal. Sigmoid colonic diverticulosis noted. Very mild adjacent mesenteric fat streaking is present. Mild diverticulitis cannot be excluded. No evidence of obstruction. No evidence of abscess. Scattered diverticula are noted throughout the remaining of the colon. No small bowel distention. The stomach is nondistended. No free air. Right lateral lower abdominal wall hernia adjacent to the rectus muscle noted. Herniation of small bowel loops. No evidence of strangulation, bowel wall edema, pneumatosis, or bowel obstruction. Small inguinal hernias with herniation of fat.  Basilar atelectasis and/or scarring. Cardiomegaly. No acute bony abnormality. Degenerative changes lumbar spine and both hips. Stable calcified density left proximal femur consistent with bone  island.  IMPRESSION: 1. Diffuse fatty infiltration of liver. 2. 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 The aneurysm appears slightly smaller on today's exam compared to prior CT of 11/21/2011 . 3. Mild bladder wall thickening. Bladder wall pathology including cystitis cannot be excluded. 4. Severe sigmoid colonic diverticulosis. Very mild sigmoid colonic diverticulitis cannot be excluded. 5. Right lateral lower abdominal wall hernia adjacent to the rectus muscle. This herniation small bowel. No evidence of obstruction or strangulation.   Electronically Signed   By: Maisie Fus  Register   On: 10/22/2013 18:12    Microbiology: No results found for this or any previous visit (from the past 240 hour(s)).   Labs: Basic Metabolic Panel:  Recent Labs Lab 10/21/13  0800 10/21/13 1936 10/23/13 10/25/13 1830 10/26/13 0516  NA 141  --  140 139 140  K 3.9  --  3.6* 4.0 3.7  CL 105  --  104 103 105  CO2 22  --  19 23 24   GLUCOSE 160*  --  206* 205* 144*  BUN 10  --  8 12 11   CREATININE 0.56 0.61 0.56 0.88 0.79  CALCIUM 9.4  --  9.1 9.0 9.0   Liver Function Tests:  Recent Labs Lab 10/22/13 0737 10/23/13 10/26/13 0516  AST 16 16 17   ALT 20 17 16   ALKPHOS 163* 163* 132*  BILITOT 0.3 0.3 0.3  PROT 7.1 7.3 6.7  ALBUMIN 3.7 3.4* 3.2*    Recent Labs Lab 10/21/13 1936 10/22/13 1227 10/23/13 10/26/13 0516  LIPASE 81* 31 31 25   AMYLASE 480* 127* 51  --    No results found for this basename: AMMONIA,  in the last 168 hours CBC:  Recent Labs Lab 10/21/13 0800 10/21/13 1936 10/23/13 10/25/13 0500  WBC 6.9 7.0 8.4 7.9  HGB 13.0 11.9* 12.8 12.8  HCT 39.9 36.6 39.2 39.7  MCV 76.6* 77.1* 78.1 78.3  PLT 214 212 231 216   Cardiac Enzymes:  Recent Labs Lab 10/21/13 0800 10/21/13 1936 10/22/13 0128 10/22/13 0737  TROPONINI <0.30 <0.30 <0.30 <0.30   BNP: BNP (last 3 results)  Recent Labs  10/21/13 0800  PROBNP 289.7*   CBG:  Recent Labs Lab 10/25/13 0741 10/25/13 1152  10/25/13 1653 10/25/13 2128 10/26/13 0753  GLUCAP 148* 124* 123* 164* 137*       Signed:  Trew Sunde A  [facility] 10/26/2013, 9:10 AM

## 2013-10-26 NOTE — Progress Notes (Signed)
    Progress Note   Subjective  Pt feels overall much better, no further chest pain, odynophagia or abd pain Having BMs that are normal, without blood or melena Transitioned to oral abx today, ate eggs and grits for breakfast Persistent mild nausea, no vomiting. Good appetite and in fact she reports she feels like eating helps the nausea   Objective  Vital signs in last 24 hours: Temp:  [97.5 F (36.4 C)-98.6 F (37 C)] 98.6 F (37 C) (10/03 0459) Pulse Rate:  [55-59] 57 (10/03 0459) Resp:  [18] 18 (10/03 0459) BP: (124-143)/(56-69) 143/57 mmHg (10/03 0459) SpO2:  [92 %-94 %] 92 % (10/03 0459) Weight:  [167 lb 4.8 oz (75.887 kg)] 167 lb 4.8 oz (75.887 kg) (10/03 0459) Last BM Date: 10/25/13 General:   Alert,  Well-developed,   in NAD Heart:  Regular rate and rhythm; no murmurs Abdomen:  Soft, no longer tender and nondistended. Normal bowel sounds, without guarding, and without rebound.   Extremities:  Without edema. Neurologic:  Alert and  oriented x4;  grossly normal neurologically. Psych:  Alert and cooperative. Normal mood and affect.  Intake/Output from previous day: 10/02 0701 - 10/03 0700 In: 2100 [IV Piggyback:2100] Out: -  Intake/Output this shift:    Lab Results:  Recent Labs  10/25/13 0500  WBC 7.9  HGB 12.8  HCT 39.7  PLT 216   BMET  Recent Labs  10/25/13 1830 10/26/13 0516  NA 139 140  K 4.0 3.7  CL 103 105  CO2 23 24  GLUCOSE 205* 144*  BUN 12 11  CREATININE 0.88 0.79  CALCIUM 9.0 9.0   LFT  Recent Labs  10/26/13 0516  PROT 6.7  ALBUMIN 3.2*  AST 17  ALT 16  ALKPHOS 132*  BILITOT 0.3     Assessment & Plan  1. Diverticulitis -- improving and now on oral abx.  Would continue for 10 days.  Followup with Dr. Carlean Purl or APP after d/c to discuss repeat colonoscopy.  2. Atypical CP/odynophagia -- likely reflux or mild esophagitis related.  Better with PPI and liquid carafate.  Would continue PPI until office followup  3. Nausea --  likely related to #1 and perhaps the abx.  She has been transitioned to oral cipro/metronidazole.  The latter can definitely cause nausea and should be taken with food.  SL zofran at discharge.  If flagyl is noted to worsen nausea, then it can be stopped, but cipro should be taken until completion.  EGD can be considered at time of colonoscopy.  Pt okay for d/c to home today.  Discussed with hospitalist who agrees  Principal Problem:   Diverticulosis of large intestine Active Problems:   Hypertrophic obstructive cardiomyopathy(425.11)   Chronic diastolic heart failure   Chest pain syndrome   Pancreatitis   Odynophagia   Acute pancreatitis     LOS: 5 days   Kenzo Ozment M  10/26/2013, 9:28 AM

## 2013-10-28 ENCOUNTER — Encounter: Payer: Self-pay | Admitting: Physician Assistant

## 2013-10-31 ENCOUNTER — Encounter: Payer: Self-pay | Admitting: Internal Medicine

## 2013-10-31 ENCOUNTER — Encounter: Payer: Self-pay | Admitting: Physician Assistant

## 2013-10-31 ENCOUNTER — Ambulatory Visit (INDEPENDENT_AMBULATORY_CARE_PROVIDER_SITE_OTHER): Payer: BC Managed Care – PPO | Admitting: Physician Assistant

## 2013-10-31 ENCOUNTER — Ambulatory Visit (INDEPENDENT_AMBULATORY_CARE_PROVIDER_SITE_OTHER): Payer: BC Managed Care – PPO | Admitting: Internal Medicine

## 2013-10-31 VITALS — BP 150/84 | HR 65 | Ht 69.0 in | Wt 170.2 lb

## 2013-10-31 VITALS — BP 136/92 | HR 72 | Ht 67.0 in | Wt 170.4 lb

## 2013-10-31 DIAGNOSIS — K5732 Diverticulitis of large intestine without perforation or abscess without bleeding: Secondary | ICD-10-CM

## 2013-10-31 DIAGNOSIS — Z800 Family history of malignant neoplasm of digestive organs: Secondary | ICD-10-CM

## 2013-10-31 DIAGNOSIS — Z959 Presence of cardiac and vascular implant and graft, unspecified: Secondary | ICD-10-CM

## 2013-10-31 DIAGNOSIS — R55 Syncope and collapse: Secondary | ICD-10-CM

## 2013-10-31 DIAGNOSIS — I421 Obstructive hypertrophic cardiomyopathy: Secondary | ICD-10-CM

## 2013-10-31 LAB — MDC_IDC_ENUM_SESS_TYPE_INCLINIC
Date Time Interrogation Session: 20151008161405
Zone Setting Detection Interval: 2000 ms
Zone Setting Detection Interval: 3000 ms
Zone Setting Detection Interval: 360 ms

## 2013-10-31 MED ORDER — POLYETHYLENE GLYCOL 3350 17 GM/SCOOP PO POWD: 1.0000 | Freq: Every day | ORAL | Status: DC

## 2013-10-31 MED ORDER — METRONIDAZOLE 500 MG PO TABS: 500.0000 mg | ORAL_TABLET | Freq: Two times a day (BID) | ORAL | Status: AC

## 2013-10-31 MED ORDER — CIPROFLOXACIN HCL 500 MG PO TABS: 500.0000 mg | ORAL_TABLET | Freq: Two times a day (BID) | ORAL | Status: AC

## 2013-10-31 NOTE — Patient Instructions (Signed)
Your physician recommends that you continue on your current medications as directed. Please refer to the Current Medication list given to you today.  Your physician wants you to follow-up in: 6 months with Dr. Klein. You will receive a reminder letter in the mail two months in advance. If you don't receive a letter, please call our office to schedule the follow-up appointment.  

## 2013-10-31 NOTE — Progress Notes (Signed)
Subjective:    Patient ID: Stacey [name], female    DOB: 27-Oct-1950, 63 y.o.   MRN: [id number]  HPI  Stacey [name] is a pleasant 63 year old white female known to Dr. Carlean Purl who was recently hospitalized 928 through 10/26/2013. She had presented with chest pain and abdominal pain, she ruled out for any new cardiac issues and was found on CT scan to have an acute diverticulitis. She was seen by Dr. Hilarie Fredrickson for GI consultation during her stay. Initial reservation a bit confusing because amylase and lipase were also elevated though no CT evidence for pancreatitis. She was treated with a course of Cipro and Flagyl and allowed discharged home in an improved condition on 10/26/2013. Other medical issues include coronary artery disease hypertrophic cardiomyopathy she is status post septal myomectomy, EF is approximate 60%. She has sickle cell disease, diabetes mellitus, COPD, hypertension ,and known pandiverticulosis. She comes in today for post hospital followup. She is on her last day of antibiotics and says she is feeling much better no remains fatigued. She has had no fever or chills, her nausea has resolved and she is eating without difficulty no says she is very particular about what she eats. Bowel movements are normal no melena or hematochezia. She is continuing on Protonix and had also been taking Carafate but does not like the taste. Last colonoscopy had been done in 2009 per Dr. Carlean Purl with finding of pan diverticulosis and internal hemorrhoids otherwise normal exam. She was slated for 5 year interval followup due to family history of colon cancer.   Review of Systems  Constitutional: Positive for fatigue.  HENT: Negative.   Eyes: Negative.   Respiratory: Negative.   Cardiovascular: Negative.   Gastrointestinal: Positive for abdominal pain.  Endocrine: Negative.   Genitourinary: Negative.   Musculoskeletal: Negative.   Skin: Negative.   Allergic/Immunologic: Negative.     Neurological: Negative.   Hematological: Negative.   Psychiatric/Behavioral: Negative.    Outpatient Prescriptions Prior to Visit  Medication Sig Dispense Refill  . atorvastatin (LIPITOR) 10 MG tablet Take 10 mg by mouth daily.      . cetirizine (ZYRTEC) 10 MG tablet Take 10 mg by mouth daily.        . ciprofloxacin (CIPRO) 500 MG tablet Take 1 tablet (500 mg total) by mouth 2 (two) times daily.  10 tablet  0  . fluticasone (FLONASE) 50 MCG/ACT nasal spray Place 2 sprays into the nose daily.  16 g  6  . irbesartan (AVAPRO) 300 MG tablet Take 300 mg by mouth at bedtime.      . metoprolol succinate (TOPROL-XL) 100 MG 24 hr tablet Take 100 mg by mouth daily. Take with or immediately following a meal.      . metroNIDAZOLE (FLAGYL) 500 MG tablet Take 1 tablet (500 mg total) by mouth every 8 (eight) hours.  15 tablet  0  . ondansetron (ZOFRAN) 4 MG tablet Take 1 tablet (4 mg total) by mouth every 8 (eight) hours as needed for nausea or vomiting.  20 tablet  0  . oxcarbazepine (TRILEPTAL) 600 MG tablet Take 600 mg by mouth 2 (two) times daily.      . pantoprazole (PROTONIX) 40 MG tablet Take 1 tablet (40 mg total) by mouth daily.  30 tablet  0  . potassium chloride SA (K-DUR,KLOR-CON) 20 MEQ tablet Take 20 mEq by mouth daily as needed. For low potassium      . sucralfate (CARAFATE) 1 GM/10ML suspension Take 10 mLs (1 g  total) by mouth 4 (four) times daily -  with meals and at bedtime.  420 mL  0  . topiramate (TOPAMAX) 200 MG tablet Take 100 mg by mouth at bedtime. Tapering off currently on 100 mg then will reduce to 50 mg then reduce to 25 mg       No facility-administered medications prior to visit.   Allergies  Allergen Reactions  . Dairy Aid [Lactase] Diarrhea and Other (See Comments)    Bloating and gastric distress  . Ace Inhibitors Other (See Comments)    REACTION: angioedema right eye  . Aspirin Other (See Comments)    Bleeding   . Codeine Other (See Comments)    Hallucinations, can  take if with someone.  . Soy Allergy Other (See Comments)    Bleeding & cramps   Patient Active Problem List   Diagnosis Date Noted  . Pancreatitis 10/22/2013  . Odynophagia 10/22/2013  . Chest pain syndrome 10/21/2013  . Syncope 12/18/2012  . Loop recorder-LINQ 12/18/2012  . Palpitations 09/12/2012  . Midsternal chest pain 09/12/2012  . 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 Abdominal  pain, other specified site 07/24/2012  . Polyarthralgia 07/24/2012  . Angioedema 03/04/2012  . Varicose veins of lower extremities with other complications [id number]  . Abnormal liver function test 09/07/2011  . 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 Trigeminal neuralgia 08/15/2011  . Vertigo 07/27/2011  . Type II or unspecified type diabetes mellitus without mention of complication, uncontrolled 08/30/2010  . Encounter for long-term (current) use of high-risk medication 08/30/2010  . Status post myomectomy 06/17/2010  . LBBB (left bundle branch block) 06/17/2010  . Preventative health care 04/16/2010  . Chronic diastolic heart failure Q000111Q  . Diverticulosis of large intestine 04/06/2010  . DIVERTICULOSIS, COLON, WITH HEMORRHAGE 04/06/2010  . RECTAL BLEEDING 04/06/2010  . BRADYCARDIA 10/13/2009  . ALLERGIC RHINITIS 12/07/2006  . VENTRAL HERNIA 12/07/2006  . COLONIC POLYPS, HX OF 12/07/2006  . OBESITY 09/16/2006  . ANXIETY 09/16/2006  . DEPRESSION 09/16/2006  . HYPERLIPIDEMIA 08/01/2006  . SICKLE CELL ANEMIA 08/01/2006  . HYPERTENSION 08/01/2006  . Hypertrophic obstructive cardiomyopathy(425.11) 08/01/2006  . ASTHMA 08/01/2006       History  Substance Use Topics  . Smoking status: Former Smoker -- 0.25 packs/day for 20 years    Types: Cigarettes    Quit date: 01/24/1998  . Smokeless tobacco: Never Used     Comment: Quit smoking 2001. Smoked on and off for 20 years. Smoked 2-3 cigars daily  . Alcohol Use: No   family history includes Clotting disorder in her daughter and other; Colon  cancer in her daughter; Diabetes in her paternal grandmother; Heart attack in her father and sister; Heart disease in her daughter and father; Hyperlipidemia in her brother, daughter, and sister; Stroke in her sister.  Objective:   Physical Exam    Well-developed older white female in no acute distress, pleasant blood pressure 136/92 pulse 72 height 5 foot 7 weight 170. HEENT ;nontraumatic normocephalic EOMI PERRLA sclera anicteric, Supple; no JVD, Cardiovascular; regular rate and rhythm with Q000111Q soft systolic murmur, Pulmonary; clear bilaterally, Abdomen; large soft bowel sounds are present very minimal tenderness the left lower quadrant no guarding or rebound no palpable mass or hepatosplenomegaly bowel sounds are present, Rectal ;exam not done, Extremities ;no clubbing cyanosis or edema skin warm and dry, Psych ;mood and affect appropriate       Assessment & Plan:  #63  63 year old female seen in post hospital followup today after an admission  with acute diverticulitis. She is significantly improved with diverticulitis almost completely resolved. #2 nausea and vomiting resolved #3 positive family history of colon cancer and patient's daughter #4 known pandiverticulosis #5 elevated amylase and lipase on recent admission no CT evidence for pancreatitis #6 coronary artery disease #7 hypertrophic cardiomyopathy EF 60% #8 hypertension #9 COPD #10 diabetes mellitus #11 sickle cell  Plan; would like patient to complete a total of 14 days of Cipro and Flagyl and 4 more days of each will be sent to her pharmacy. She may stop Carafate Continue Protonix 40 mg by mouth every morning Do not feel she needs to have the EGD at this time We will schedule for colonoscopy with Dr. Carlean Purl. Procedure discussed in detail with the patient and she is agreeable to proceed. She is advised to call if she develops any recurrent symptoms in the interim between now and her procedure.

## 2013-10-31 NOTE — Progress Notes (Signed)
Patient Care Team: Corwin LevinsJames W John, MD as PCP - General (Internal Medicine)   HPI  Stacey [name] is a 63 y.o. female Seen in followup for her in the recorder implanted for palpitations and near syncope in the setting of hypertrophic heart myopathy with prior myectomy  She has had recurrent palpitations and underwent implantable loop recorder insertion.   Recently hospitalized for chest pain>>diverticulitis  Palpitations>>NSR Past Medical History  Diagnosis Date  . HYPERLIPIDEMIA 08/01/2006  . OBESITY 09/16/2006  . SICKLE CELL ANEMIA 08/01/2006  . ANXIETY 09/16/2006  . DEPRESSION 09/16/2006  . HYPERTENSION 08/01/2006  . 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 s/p septal myomectomy 4/12 at [facility] with Dr. Silvestre MesiGlower;  b. echo 5/12: EF 60-65%, LVOT peak 18 mmHg; grade 1 diast dysfxn, mild SAM (improved since myomectomy;  c. 03/2012 Echo: EF 60-65%, mod-sev basal septal asymm hypertrophy, basal septal HK, LVOT grad 68mmHg, Gr 1 DD, , SAM, Mild MR, nl RV, PASP 35mmHg; 08/2012 Echo: technically difficult, doubt LVOT obstruction, EF 60%, Gr 1 DD, mod-sev dil LA.  . BRADYCARDIA 10/13/2009  . ALLERGIC RHINITIS 12/07/2006  . ASTHMA 08/01/2006  . VENTRAL HERNIA 12/07/2006  . ANGIOEDEMA 03/07/2008  . ABDOMINAL WALL CONTUSION 02/18/2010  . Wheezing 04/01/2010  . DIVERTICULOSIS, COLON, WITH HEMORRHAGE 04/06/2010  . RECTAL BLEEDING 04/06/2010  . CAD (coronary artery disease)     a. 01/2010 : Minimal plaque at cardiac catheterization - CFX 20%, EF 70%;  b. 08/2012 Cath: Nl Cors, EF 65%.  Marland Kitchen. LBBB (left bundle branch block) 06/17/2010  . Chronic diastolic CHF (congestive heart failure) 04/06/2010    a. In setting of HOCM.  Marland Kitchen. Impaired glucose tolerance 06/25/2010  . DM (diabetes mellitus) in pregnancy, delivered w/postpartum condition 08/30/2010  . Type II or unspecified type diabetes mellitus without mention of complication, uncontrolled 08/30/2010  . COPD (chronic obstructive pulmonary  disease)   . Frequent PVCs     a. Medtronic LINQ Reveal Loop Recorder  Serial Number  [id number]     Past Surgical History  Procedure Laterality Date  . Abdominal hysterectomy  1987  . Ventral hernia repair  06/2006  . Myomectomy      Septal  . Left heart cath  Aug. 18, 2014    Medtronic heart device    Current Outpatient Prescriptions  Medication Sig Dispense Refill  . atorvastatin (LIPITOR) 10 MG tablet Take 10 mg by mouth daily.      . cetirizine (ZYRTEC) 10 MG tablet Take 10 mg by mouth daily.        . ciprofloxacin (CIPRO) 500 MG tablet Take 1 tablet (500 mg total) by mouth 2 (two) times daily.  8 tablet  0  . fluticasone (FLONASE) 50 MCG/ACT nasal spray Place 2 sprays into the nose daily.  16 g  6  . irbesartan (AVAPRO) 300 MG tablet Take 300 mg by mouth at bedtime.      . metoprolol succinate (TOPROL-XL) 100 MG 24 hr tablet Take 100 mg by mouth daily. Take with or immediately following a meal.      . metroNIDAZOLE (FLAGYL) 500 MG tablet Take 1 tablet (500 mg total) by mouth 2 (two) times daily.  8 tablet  0  . ondansetron (ZOFRAN) 4 MG tablet Take 1 tablet (4 mg total) by mouth every 8 (eight) hours as needed for nausea or vomiting.  20 tablet  0  . oxcarbazepine (TRILEPTAL) 600 MG tablet Take 600 mg by mouth 2 (two) times  daily.      . pantoprazole (PROTONIX) 40 MG tablet Take 1 tablet (40 mg total) by mouth daily.  30 tablet  0  . polyethylene glycol powder (GLYCOLAX/MIRALAX) powder Take 255 g by mouth daily.  255 g  0  . potassium chloride SA (K-DUR,KLOR-CON) 20 MEQ tablet Take 20 mEq by mouth daily as needed. For low potassium      . sucralfate (CARAFATE) 1 GM/10ML suspension Take 10 mLs (1 g total) by mouth 4 (four) times daily -  with meals and at bedtime.  420 mL  0  . topiramate (TOPAMAX) 200 MG tablet Take 100 mg by mouth at bedtime. Tapering off currently on 100 mg then will reduce to 50 mg then reduce to 25 mg       No current facility-administered medications for  this visit.    Allergies  Allergen Reactions  . Dairy Aid [Lactase] Diarrhea and Other (See Comments)    Bloating and gastric distress  . Ace Inhibitors Other (See Comments)    REACTION: angioedema right eye  . Aspirin Other (See Comments)    Bleeding   . Codeine Other (See Comments)    Hallucinations, can take if with someone.  . Soy Allergy Other (See Comments)    Bleeding & cramps    Review of Systems negative except from HPI and PMH  Physical Exam Ht 5\' 9"  (1.753 m)  Wt 170 lb 3.2 oz (77.202 kg)  BMI 25.12 kg/m2 Well developed and nourished in no acute distress HENT normal Neck supple with JVP-flat Clear sternotomnoy Regular rate and rhythm,2/6 murmur Abd-soft with active BS No Clubbing cyanosis edema Skin-warm and dry A & Oriented  Grossly normal sensory and motor function  Loop recorder interrogation demonstrates no intercurrent recorded events   Assessment and  Plan  HCM  Palpiatons  S/p myectomy  LINQ recorder  Device interrogation demonstrated sinus with her palpitations.  She had some sinus node pauses associated with vomiting.  Symptomatic HCM right now is reasonably controlled.  Genetic testing was not undertaken to do. Family screening has been avoided by her children half siblings.

## 2013-10-31 NOTE — Patient Instructions (Addendum)
You have been scheduled for a colonoscopy. Please follow written instructions given to you at your visit today.  Please pick up your prep kit at the pharmacy within the next 1-3 days. Generic Miralax - [facility] [place]. If you use inhalers (even only as needed), please bring them with you on the day of your procedure. Your physician has requested that you go to www.startemmi.com and enter the access code given to you at your visit today. This web site gives a general overview about your procedure. However, you should still follow specific instructions given to you by our office regarding your preparation for the procedure.  We sent 4 more days of Flagyl ( Metronidazole) and Cipro to [facility] [address] [place].

## 2013-11-01 NOTE — Progress Notes (Signed)
Loop recorder 

## 2013-11-06 NOTE — Progress Notes (Signed)
Agree with Ms. Esterwood's assessment and plan. Carl E. Gessner, MD, FACG   

## 2013-11-08 ENCOUNTER — Encounter: Payer: Self-pay | Admitting: Internal Medicine

## 2013-11-11 ENCOUNTER — Ambulatory Visit (AMBULATORY_SURGERY_CENTER): Payer: BC Managed Care – PPO | Admitting: Internal Medicine

## 2013-11-11 ENCOUNTER — Encounter: Payer: Self-pay | Admitting: Internal Medicine

## 2013-11-11 VITALS — BP 144/74 | HR 58 | Temp 96.2°F | Resp 18 | Ht 67.0 in | Wt 170.0 lb

## 2013-11-11 DIAGNOSIS — Z1211 Encounter for screening for malignant neoplasm of colon: Secondary | ICD-10-CM

## 2013-11-11 DIAGNOSIS — K5730 Diverticulosis of large intestine without perforation or abscess without bleeding: Secondary | ICD-10-CM

## 2013-11-11 DIAGNOSIS — Z800 Family history of malignant neoplasm of digestive organs: Secondary | ICD-10-CM

## 2013-11-11 MED ORDER — SODIUM CHLORIDE 0.9 % IV SOLN: 500.0000 mL | INTRAVENOUS | Status: DC

## 2013-11-11 NOTE — Op Note (Signed)
[place]  [address] [place], [id number]   COLONOSCOPY PROCEDURE REPORT  PATIENT: Stacey [name], Stacey [name]  MR#: [id number] BIRTHDATE: Jun 21, 1950 , 5  yrs. old GENDER: female ENDOSCOPIST: Gatha Mayer, MD, [facility] PROCEDURE DATE:  11/11/2013 PROCEDURE:   Colonoscopy, screening First Screening Colonoscopy - Avg.  risk and is 50 yrs.  old or older - No.  Prior Negative Screening - Now for repeat screening. Less than 10 yrs Prior Negative Screening - Now for repeat screening.  Above average risk  History of Adenoma - Now for follow-up colonoscopy & has been > or = to 3 yrs.  N/A  Polyps Removed Today? No.  Polyps Removed Today? No.  Recommend repeat exam, <10 yrs? Polyps Removed Today? No.  Recommend repeat exam, <10 yrs? Yes.  Polyps Removed Today? No.  Recommend repeat exam, <10 yrs? Yes.  High risk (family or personal hx). ASA CLASS:   Class III INDICATIONS:patient's immediate family history of colon cancer. MEDICATIONS: Propofol 180 mg IV and Monitored anesthesia care  DESCRIPTION OF PROCEDURE:   After the risks benefits and alternatives of the procedure were thoroughly explained, informed consent was obtained.  The digital rectal exam revealed no abnormalities of the rectum.   The LB [id number]  endoscope was introduced through the anus and advanced to the cecum, which was identified by both the appendix and ileocecal valve. No adverse events experienced.   The quality of the prep was excellent, using MiraLax  The instrument was then slowly withdrawn as the colon was fully examined.      COLON FINDINGS: There was severe diverticulosis noted in the right colon and left colon.   The examination was otherwise normal. Retroflexed views revealed no abnormalities. The time to cecum=2 minutes 05 seconds.  Withdrawal time=8 minutes 11 seconds.  The scope was withdrawn and the procedure completed. COMPLICATIONS: There were no immediate  complications.  ENDOSCOPIC IMPRESSION: 1.   Severe diverticulosis was noted in the right colon and left colon 2.   The examination was otherwise normal  RECOMMENDATIONS: Repeat Colonoscopy in 5 years.  eSigned:  Gatha Mayer, MD, [facility] 11/11/2013 2:00 PM   cc: The Patient

## 2013-11-11 NOTE — Progress Notes (Signed)
Discussed "soy" allergy with Dr. Leone PayorGessner, believe it is a hypersensitivity with GI effects. We will proceed with propofol

## 2013-11-11 NOTE — Progress Notes (Signed)
A/ox3 pleased with MAC, report to Karen RN 

## 2013-11-11 NOTE — Patient Instructions (Addendum)
No polyps or cancer - again!  No diverticulitis now.  Next routine colonoscopy in 5 years - 2020  I appreciate the opportunity to care for you. Iva Booparl E. Persephonie Hegwood, MD, FACG   YOU HAD AN ENDOSCOPIC PROCEDURE TODAY AT THE [facility]: Refer to the procedure report that was given to you for any specific questions about what was found during the examination.  If the procedure report does not answer your questions, please call your gastroenterologist to clarify.  If you requested that your care partner not be given the details of your procedure findings, then the procedure report has been included in a sealed envelope for you to review at your convenience later.  YOU SHOULD EXPECT: Some feelings of bloating in the abdomen. Passage of more gas than usual.  Walking can help get rid of the air that was put into your GI tract during the procedure and reduce the bloating. If you had a lower endoscopy (such as a colonoscopy or flexible sigmoidoscopy) you may notice spotting of blood in your stool or on the toilet paper. If you underwent a bowel prep for your procedure, then you may not have a normal bowel movement for a few days.  DIET: Your first meal following the procedure should be a light meal and then it is ok to progress to your normal diet.  A half-sandwich or bowl of soup is an example of a good first meal.  Heavy or fried foods are harder to digest and may make you feel nauseous or bloated.  Likewise meals heavy in dairy and vegetables can cause extra gas to form and this can also increase the bloating.  Drink plenty of fluids but you should avoid alcoholic beverages for 24 hours.  ACTIVITY: Your care partner should take you home directly after the procedure.  You should plan to take it easy, moving slowly for the rest of the day.  You can resume normal activity the day after the procedure however you should NOT DRIVE or use heavy machinery for 24 hours (because of the sedation  medicines used during the test).    SYMPTOMS TO REPORT IMMEDIATELY: A gastroenterologist can be reached at any hour.  During normal business hours, 8:30 AM to 5:00 PM Monday through Friday, call [phone].  After hours and on weekends, please call the GI answering service at [phone] who will take a message and have the physician on call contact you.   Following lower endoscopy (colonoscopy or flexible sigmoidoscopy):  Excessive amounts of blood in the stool  Significant tenderness or worsening of abdominal pains  Swelling of the abdomen that is new, acute  Fever of 100F or higher FOLLOW UP: If any biopsies were taken you will be contacted by phone or by letter within the next 1-3 weeks.  Call your gastroenterologist if you have not heard about the biopsies in 3 weeks.  Our staff will call the home number listed on your records the next business day following your procedure to check on you and address any questions or concerns that you may have at that time regarding the information given to you following your procedure. This is a courtesy call and so if there is no answer at the home number and we have not heard from you through the emergency physician on call, we will assume that you have returned to your regular daily activities without incident.  SIGNATURES/CONFIDENTIALITY: You and/or your care partner have signed paperwork which will be entered into  your electronic medical record.  These signatures attest to the fact that that the information above on your After Visit Summary has been reviewed and is understood.  Full responsibility of the confidentiality of this discharge information lies with you and/or your care-partner.

## 2013-11-12 ENCOUNTER — Encounter: Payer: Self-pay | Admitting: Internal Medicine

## 2013-11-12 ENCOUNTER — Telehealth: Payer: Self-pay

## 2013-11-12 NOTE — Telephone Encounter (Signed)
  Follow up Call-  Call back number 11/11/2013  Post procedure Call Back phone  # [phone]  Permission to leave phone message Yes     Patient questions:  Do you have a fever, pain , or abdominal swelling? No. Pain Score  0 *  Have you tolerated food without any problems? Yes.    Have you been able to return to your normal activities? Yes.    Do you have any questions about your discharge instructions: Diet   No. Medications  No. Follow up visit  No.  Do you have questions or concerns about your Care? No.  Actions: * If pain score is 4 or above: No action needed, pain <4.

## 2013-11-19 ENCOUNTER — Other Ambulatory Visit: Payer: Self-pay | Admitting: Internal Medicine

## 2013-11-22 ENCOUNTER — Ambulatory Visit (INDEPENDENT_AMBULATORY_CARE_PROVIDER_SITE_OTHER): Payer: BC Managed Care – PPO | Admitting: *Deleted

## 2013-11-22 DIAGNOSIS — R002 Palpitations: Secondary | ICD-10-CM

## 2013-11-27 NOTE — Progress Notes (Signed)
Loop recorder 

## 2013-11-29 ENCOUNTER — Encounter: Payer: Self-pay | Admitting: Surgery

## 2013-12-02 ENCOUNTER — Ambulatory Visit: Payer: BC Managed Care – PPO

## 2013-12-02 ENCOUNTER — Ambulatory Visit: Payer: BC Managed Care – PPO | Admitting: Surgery

## 2013-12-02 ENCOUNTER — Other Ambulatory Visit: Payer: Self-pay | Admitting: Surgery

## 2013-12-02 LAB — BUN: BUN: 12 mg/dL (ref 6–23)

## 2013-12-02 LAB — CREATININE, SERUM: Creat: 0.81 mg/dL (ref 0.50–1.10)

## 2013-12-03 ENCOUNTER — Encounter: Payer: Self-pay | Admitting: Internal Medicine

## 2013-12-04 ENCOUNTER — Ambulatory Visit (INDEPENDENT_AMBULATORY_CARE_PROVIDER_SITE_OTHER): Payer: BC Managed Care – PPO | Admitting: Internal Medicine

## 2013-12-04 ENCOUNTER — Encounter: Payer: Self-pay | Admitting: Internal Medicine

## 2013-12-04 VITALS — BP 140/82 | HR 84 | Temp 99.2°F | Wt 171.0 lb

## 2013-12-04 DIAGNOSIS — I10 Essential (primary) hypertension: Secondary | ICD-10-CM

## 2013-12-04 DIAGNOSIS — J209 Acute bronchitis, unspecified: Secondary | ICD-10-CM

## 2013-12-04 DIAGNOSIS — R062 Wheezing: Secondary | ICD-10-CM

## 2013-12-04 MED ORDER — PREDNISONE 10 MG PO TABS: ORAL_TABLET | ORAL | Status: DC

## 2013-12-04 MED ORDER — METHYLPREDNISOLONE ACETATE 80 MG/ML IJ SUSP
80.0000 mg | Freq: Once | INTRAMUSCULAR | Status: AC
Start: 2013-12-04 — End: 2013-12-04
  Administered 2013-12-04: 80 mg via INTRAMUSCULAR

## 2013-12-04 MED ORDER — LEVOFLOXACIN 250 MG PO TABS: 250.0000 mg | ORAL_TABLET | Freq: Every day | ORAL | Status: DC

## 2013-12-04 MED ORDER — ALBUTEROL SULFATE HFA 108 (90 BASE) MCG/ACT IN AERS: 2.0000 | INHALATION_SPRAY | Freq: Four times a day (QID) | RESPIRATORY_TRACT | Status: DC | PRN

## 2013-12-04 MED ORDER — HYDROCODONE-HOMATROPINE 5-1.5 MG/5ML PO SYRP: 5.0000 mL | ORAL_SOLUTION | Freq: Four times a day (QID) | ORAL | Status: DC | PRN

## 2013-12-04 NOTE — Progress Notes (Signed)
Pre visit review using our clinic review tool, if applicable. No additional management support is needed unless otherwise documented below in the visit note. 

## 2013-12-04 NOTE — Patient Instructions (Signed)
You had the steroid shot today  Please take all new medication as prescribed - the antibiotic, cough medicine, prednisone, and inhaler  Please continue all other medications as before, and refills have been done if requested.  Please keep your appointments with your specialists as you may have planned

## 2013-12-04 NOTE — Progress Notes (Signed)
Subjective:    Patient ID: Stacey MorinChristine O [name], female    DOB: 08/13/1950, 63 y.o.   MRN: [id number]  HPI  Here with acute onset mild to mod 2-3 days ST, HA, general weakness and malaise, with prod cough greenish sputum, but Pt denies chest pain, increased sob or doe, wheezing, orthopnea, PND, increased LE swelling, palpitations, dizziness or syncope, except for onset mild wheezing/sob last night  Pt denies new neurological symptoms such as new headache, or facial or extremity weakness or numbness  . Pt denies polydipsia, polyuria, Past Medical History  Diagnosis Date  . HYPERLIPIDEMIA 08/01/2006  . OBESITY 09/16/2006  . SICKLE CELL ANEMIA 08/01/2006  . ANXIETY 09/16/2006  . DEPRESSION 09/16/2006  . HYPERTENSION 08/01/2006  . 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 s/p septal myomectomy 4/12 at [facility] with Dr. Silvestre MesiGlower;  b. echo 5/12: EF 60-65%, LVOT peak 18 mmHg; grade 1 diast dysfxn, mild SAM (improved since myomectomy;  c. 03/2012 Echo: EF 60-65%, mod-sev basal septal asymm hypertrophy, basal septal HK, LVOT grad 68mmHg, Gr 1 DD, , SAM, Mild MR, nl RV, PASP 35mmHg; 08/2012 Echo: technically difficult, doubt LVOT obstruction, EF 60%, Gr 1 DD, mod-sev dil LA.  . BRADYCARDIA 10/13/2009  . ALLERGIC RHINITIS 12/07/2006  . ASTHMA 08/01/2006  . VENTRAL HERNIA 12/07/2006  . ANGIOEDEMA 03/07/2008  . ABDOMINAL WALL CONTUSION 02/18/2010  . Wheezing 04/01/2010  . DIVERTICULOSIS, COLON, WITH HEMORRHAGE 04/06/2010  . RECTAL BLEEDING 04/06/2010  . CAD (coronary artery disease)     a. 01/2010 : Minimal plaque at cardiac catheterization - CFX 20%, EF 70%;  b. 08/2012 Cath: Nl Cors, EF 65%.  Marland Kitchen. LBBB (left bundle branch block) 06/17/2010  . Chronic diastolic CHF (congestive heart failure) 04/06/2010    a. In setting of HOCM.  Marland Kitchen. Impaired glucose tolerance 06/25/2010  . DM (diabetes mellitus) in pregnancy, delivered w/postpartum condition 08/30/2010  . Type II or unspecified type diabetes mellitus without  mention of complication, uncontrolled 08/30/2010  . COPD (chronic obstructive pulmonary disease)   . Frequent PVCs     a. Medtronic LINQ Reveal Loop Recorder  Serial Number  [id number]    Past Surgical History  Procedure Laterality Date  . Abdominal hysterectomy  1987  . Ventral hernia repair  06/2006  . Myomectomy      Septal  . Left heart cath  Aug. 18, 2014    Medtronic heart device    reports that she quit smoking about 15 years ago. Her smoking use included Cigarettes. She has a 5 pack-year smoking history. She has never used smokeless tobacco. She reports that she does not drink alcohol or use illicit drugs. family history includes Clotting disorder in her daughter and other; Colon cancer in her daughter; Diabetes in her paternal grandmother; Heart attack in her father and sister; Heart disease in her daughter and father; Hyperlipidemia in her brother, daughter, and sister; Stroke in her sister. Allergies  Allergen Reactions  . Dairy Aid [Lactase] Diarrhea and Other (See Comments)    Bloating and gastric distress  . Ace Inhibitors Other (See Comments)    REACTION: angioedema right eye  . Aspirin Other (See Comments)    Bleeding   . Codeine Other (See Comments)    Hallucinations, can take if with someone.  . Soy Allergy Other (See Comments)    Bleeding & cramps   Current Outpatient Prescriptions on File Prior to Visit  Medication Sig Dispense Refill  . atorvastatin (LIPITOR) 10 MG tablet Take 10 mg  by mouth daily.    Marland Kitchen. atorvastatin (LIPITOR) 10 MG tablet TAKE 1 TABLET BY MOUTH EVERY DAY 30 tablet 11  . cetirizine (ZYRTEC) 10 MG tablet Take 10 mg by mouth daily.      . fluticasone (FLONASE) 50 MCG/ACT nasal spray Place 2 sprays into the nose daily. 16 g 6  . irbesartan (AVAPRO) 300 MG tablet Take 300 mg by mouth at bedtime.    . metoprolol succinate (TOPROL-XL) 100 MG 24 hr tablet Take 100 mg by mouth daily. Take with or immediately following a meal.    . oxcarbazepine  (TRILEPTAL) 600 MG tablet Take 600 mg by mouth 2 (two) times daily.    . pantoprazole (PROTONIX) 40 MG tablet Take 1 tablet (40 mg total) by mouth daily. 30 tablet 0  . potassium chloride SA (K-DUR,KLOR-CON) 20 MEQ tablet Take 20 mEq by mouth daily as needed. For low potassium    . sucralfate (CARAFATE) 1 GM/10ML suspension Take 10 mLs (1 g total) by mouth 4 (four) times daily -  with meals and at bedtime. 420 mL 0  . topiramate (TOPAMAX) 200 MG tablet Take 100 mg by mouth at bedtime. Tapering off currently on 100 mg then will reduce to 50 mg then reduce to 25 mg    . albuterol (PROVENTIL HFA;VENTOLIN HFA) 108 (90 BASE) MCG/ACT inhaler Inhale 2 puffs into the lungs every 4 (four) hours as needed for wheezing. 1 Inhaler 1   No current facility-administered medications on file prior to visit.   Review of Systems  Constitutional: Negative for unusual diaphoresis or other sweats  HENT: Negative for ringing in ear Eyes: Negative for double vision or worsening visual disturbance.  Respiratory: Negative for choking and stridor.   Gastrointestinal: Negative for vomiting or other signifcant bowel change Genitourinary: Negative for hematuria or decreased urine volume.  Musculoskeletal: Negative for other MSK pain or swelling Skin: Negative for color change and worsening wound.  Neurological: Negative for tremors and numbness other than noted  Psychiatric/Behavioral: Negative for decreased concentration or agitation other than above       Objective:   Physical Exam BP 140/82 mmHg  Pulse 84  Temp(Src) 99.2 F (37.3 C) (Oral)  Wt 171 lb (77.565 kg)  SpO2 93% VS noted, mild ill Constitutional: Pt appears well-developed, well-nourished.  HENT: Head: NCAT.  Right Ear: External ear normal.  Left Ear: External ear normal.  Eyes: . Pupils are equal, round, and reactive to light. Conjunctivae and EOM are normal Neck: Normal range of motion. Neck supple.  Bilat tm's with mild erythema.  Max sinus  areas non tender.  Pharynx with mild erythema, no exudate Cardiovascular: Normal rate and regular rhythm.   Pulmonary/Chest: Effort normal and breath sounds decreased with few bilat wheeze Neurological: Pt is alert. Not confused , motor grossly intact Skin: Skin is warm. No rash Psychiatric: Pt behavior is normal. No agitation.     Assessment & Plan:

## 2013-12-05 NOTE — Assessment & Plan Note (Signed)
Mild to mod, for depomedrol IM,  to f/u any worsening symptoms or concerns 

## 2013-12-05 NOTE — Assessment & Plan Note (Signed)
Mild to mod, for antibx course,  to f/u any worsening symptoms or concerns 

## 2013-12-05 NOTE — Assessment & Plan Note (Signed)
stable overall by history and exam, recent data reviewed with pt, and pt to continue medical treatment as before,  to f/u any worsening symptoms or concerns BP Readings from Last 3 Encounters:  12/04/13 140/82  11/11/13 144/74  10/31/13 150/84

## 2013-12-05 NOTE — Assessment & Plan Note (Signed)
stable overall by history and exam, recent data reviewed with pt, and pt to continue medical treatment as before,  to f/u any worsening symptoms or concerns SpO2 Readings from Last 3 Encounters:  12/04/13 93%  11/11/13 93%  10/26/13 92%

## 2013-12-06 ENCOUNTER — Encounter: Payer: Self-pay | Admitting: Surgery

## 2013-12-06 LAB — MDC_IDC_ENUM_SESS_TYPE_REMOTE

## 2013-12-09 ENCOUNTER — Encounter: Payer: Self-pay | Admitting: Surgery

## 2013-12-09 ENCOUNTER — Ambulatory Visit (INDEPENDENT_AMBULATORY_CARE_PROVIDER_SITE_OTHER): Payer: BC Managed Care – PPO | Admitting: Surgery

## 2013-12-09 ENCOUNTER — Ambulatory Visit
Admission: RE | Admit: 2013-12-09 | Discharge: 2013-12-09 | Disposition: A | Discharge status: Home / Self Care | Payer: BC Managed Care – PPO | Source: Ambulatory Visit | Attending: Family | Admitting: Family

## 2013-12-09 VITALS — BP 154/58 | HR 62 | Ht 67.0 in | Wt 173.0 lb

## 2013-12-09 DIAGNOSIS — I722 Aneurysm of renal artery: Secondary | ICD-10-CM

## 2013-12-09 MED ORDER — IOHEXOL 350 MG/ML SOLN
80.0000 mL | Freq: Once | INTRAVENOUS | Status: AC | PRN
  Administered 2013-12-09: 80 mL via INTRAVENOUS

## 2013-12-09 NOTE — Progress Notes (Signed)
Patient name: Stacey MorinChristine O Crawford-Fewell MRN: [id number] DOB: 08/25/1950 Sex: female     Chief Complaint  Patient presents with  . 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 This was initially detected by CT scan and September of 2011. At that time it was a 9 mm aneurysm near the hilum. She denies any abdominal pain or renal insufficiency, or blood pressure problems   Past Medical History  Diagnosis Date  . HYPERLIPIDEMIA 08/01/2006  . OBESITY 09/16/2006  . SICKLE CELL ANEMIA 08/01/2006  . ANXIETY 09/16/2006  . DEPRESSION 09/16/2006  . HYPERTENSION 08/01/2006  . 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 s/p septal myomectomy 4/12 at [facility] with Dr. Silvestre MesiGlower;  b. echo 5/12: EF 60-65%, LVOT peak 18 mmHg; grade 1 diast dysfxn, mild SAM (improved since myomectomy;  c. 03/2012 Echo: EF 60-65%, mod-sev basal septal asymm hypertrophy, basal septal HK, LVOT grad 68mmHg, Gr 1 DD, , SAM, Mild MR, nl RV, PASP 35mmHg; 08/2012 Echo: technically difficult, doubt LVOT obstruction, EF 60%, Gr 1 DD, mod-sev dil LA.  . BRADYCARDIA 10/13/2009  . ALLERGIC RHINITIS 12/07/2006  . ASTHMA 08/01/2006  . VENTRAL HERNIA 12/07/2006  . ANGIOEDEMA 03/07/2008  . ABDOMINAL WALL CONTUSION 02/18/2010  . Wheezing 04/01/2010  . DIVERTICULOSIS, COLON, WITH HEMORRHAGE 04/06/2010  . RECTAL BLEEDING 04/06/2010  . CAD (coronary artery disease)     a. 01/2010 : Minimal plaque at cardiac catheterization - CFX 20%, EF 70%;  b. 08/2012 Cath: Nl Cors, EF 65%.  Marland Kitchen. LBBB (left bundle branch block) 06/17/2010  . Chronic diastolic CHF (congestive heart failure) 04/06/2010    a. In setting of HOCM.  Marland Kitchen. Impaired glucose tolerance 06/25/2010  . DM (diabetes mellitus) in pregnancy, delivered w/postpartum condition 08/30/2010  . Type II or unspecified type diabetes mellitus without mention of complication,  uncontrolled 08/30/2010  . COPD (chronic obstructive pulmonary disease)   . Frequent PVCs     a. Medtronic LINQ Reveal Loop Recorder  Serial Number  [id number]     Past Surgical History  Procedure Laterality Date  . Abdominal hysterectomy  1987  . Ventral hernia repair  06/2006  . Myomectomy      Septal  . Left heart cath  Aug. 18, 2014    Medtronic heart device  . Cardiac surgery      History   Social History  . Marital Status: Married    Spouse Name: N/A    Number of Children: 3  . Years of Education: N/A   Occupational History  . TEACHER [employer]    Social History Main Topics  . Smoking status: Former Smoker -- 0.25 packs/day for 20 years    Types: Cigarettes    Quit date: 01/24/1998  . Smokeless tobacco: Never Used     Comment: Quit smoking 2001. Smoked on and off for 20 years. Smoked 2-3 cigars daily  . Alcohol Use: No  . Drug Use: No  . Sexual Activity: Not on file   Other Topics Concern  . Not on file   Social History Narrative   [place].  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    Family History  Problem Relation Age of Onset  . Colon cancer Daughter     and bleeding disorder  . Hyperlipidemia Daughter   .  Heart disease Daughter   . Cancer Daughter   . Heart disease Father   . Heart attack Father   . Hypertension Father     before age 62  . Heart attack Sister   . Hyperlipidemia Sister   . Stroke Sister   . Hypertension Sister   . Hyperlipidemia Brother   . Hypertension Brother   . Diabetes Paternal Grandmother   . Clotting disorder Daughter   . Clotting disorder Other     granddaughter  . Hypertension Son     Allergies as of 12/09/2013 - Review Complete 12/09/2013  Allergen Reaction Noted  . Dairy aid [lactase] Diarrhea and Other (See Comments) 10/21/2013  . Ace inhibitors Other (See Comments) 01/14/2010  . Aspirin Other (See Comments)   . Codeine Other (See Comments) 09/16/2006  . Soy  allergy Other (See Comments) 06/17/2010    Current Outpatient Prescriptions on File Prior to Visit  Medication Sig Dispense Refill  . albuterol (PROVENTIL HFA;VENTOLIN HFA) 108 (90 BASE) MCG/ACT inhaler Inhale 2 puffs into the lungs every 6 (six) hours as needed for wheezing or shortness of breath. 18 g 2  . atorvastatin (LIPITOR) 10 MG tablet Take 10 mg by mouth daily.    Marland Kitchen. atorvastatin (LIPITOR) 10 MG tablet TAKE 1 TABLET BY MOUTH EVERY DAY 30 tablet 11  . cetirizine (ZYRTEC) 10 MG tablet Take 10 mg by mouth daily.      . fluticasone (FLONASE) 50 MCG/ACT nasal spray Place 2 sprays into the nose daily. 16 g 6  . HYDROcodone-homatropine (HYCODAN) 5-1.5 MG/5ML syrup Take 5 mLs by mouth every 6 (six) hours as needed for cough. 180 mL 0  . irbesartan (AVAPRO) 300 MG tablet Take 300 mg by mouth at bedtime.    Marland Kitchen. levofloxacin (LEVAQUIN) 250 MG tablet Take 1 tablet (250 mg total) by mouth daily. 10 tablet 0  . metoprolol succinate (TOPROL-XL) 100 MG 24 hr tablet Take 100 mg by mouth daily. Take with or immediately following a meal.    . oxcarbazepine (TRILEPTAL) 600 MG tablet Take 600 mg by mouth 2 (two) times daily.    . pantoprazole (PROTONIX) 40 MG tablet Take 1 tablet (40 mg total) by mouth daily. 30 tablet 0  . potassium chloride SA (K-DUR,KLOR-CON) 20 MEQ tablet Take 20 mEq by mouth daily as needed. For low potassium    . predniSONE (DELTASONE) 10 MG tablet 3 tabs by mouth per day for 3 days,2tabs per day for 3 days,1tab per day for 3 days 18 tablet 0  . sucralfate (CARAFATE) 1 GM/10ML suspension Take 10 mLs (1 g total) by mouth 4 (four) times daily -  with meals and at bedtime. 420 mL 0  . topiramate (TOPAMAX) 200 MG tablet Take 100 mg by mouth at bedtime. Tapering off currently on 100 mg then will reduce to 50 mg then reduce to 25 mg    . albuterol (PROVENTIL HFA;VENTOLIN HFA) 108 (90 BASE) MCG/ACT inhaler Inhale 2 puffs into the lungs every 4 (four) hours as needed for wheezing. 1 Inhaler 1    No current facility-administered medications on file prior to visit.     REVIEW OF SYSTEMS: Cardiovascular: No chest pain, chest pressure, palpitations, orthopnea, or dyspnea on exertion. No claudication or rest pain,  No history of DVT or phlebitis. Pulmonary: No productive cough, asthma or wheezing. Neurologic: No weakness, paresthesias, aphasia, or amaurosis. No dizziness. Hematologic: No bleeding problems or clotting disorders. Musculoskeletal: No joint pain or joint swelling. Gastrointestinal: No blood in  stool or hematemesis Genitourinary: No dysuria or hematuria. Psychiatric:: No history of major depression. Integumentary: No rashes or ulcers. Constitutional: positive for a recent cold  PHYSICAL EXAMINATION:   Vital signs are BP 154/58 mmHg  Pulse 62  Ht 5\' 7"  (1.702 m)  Wt 173 lb (78.472 kg)  BMI 27.09 kg/m2  SpO2 96% General: The patient appears their stated age. HEENT:  No gross abnormalities Pulmonary:  Non labored breathing Abdomen: Soft and non-tender.  No abdominal bruits Musculoskeletal: There are no major deformities. Neurologic: No focal weakness or paresthesias are detected, Skin: There are no ulcer or rashes noted. Psychiatric: The patient has normal affect. Cardiovascular: There is a regular rate and rhythm without significant murmur appreciated.   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  Assessment: Right renal artery aneurysm Plan: There has not been any significant growth in her aneurysm.  I recommend continued surveillance.  Unfortunately because of location, this is difficult to evaluate with ultrasound.  Therefore, I will order a CT angiogram in 2 years.  Based on the stability of the aneurysm will probably go to MRI afterwards.  Jorge Ny, M.D. [facility] Office: [phone] Pager:  [phone]

## 2013-12-23 ENCOUNTER — Ambulatory Visit (INDEPENDENT_AMBULATORY_CARE_PROVIDER_SITE_OTHER): Payer: BC Managed Care – PPO | Admitting: *Deleted

## 2013-12-23 DIAGNOSIS — R002 Palpitations: Secondary | ICD-10-CM

## 2013-12-27 NOTE — Progress Notes (Signed)
Loop recorder 

## 2014-01-02 ENCOUNTER — Encounter (HOSPITAL_COMMUNITY): Payer: Self-pay | Admitting: Cardiovascular Disease

## 2014-01-08 ENCOUNTER — Encounter: Payer: Self-pay | Admitting: Internal Medicine

## 2014-01-21 ENCOUNTER — Ambulatory Visit (INDEPENDENT_AMBULATORY_CARE_PROVIDER_SITE_OTHER): Payer: BC Managed Care – PPO | Admitting: *Deleted

## 2014-01-21 DIAGNOSIS — R002 Palpitations: Secondary | ICD-10-CM

## 2014-01-21 LAB — MDC_IDC_ENUM_SESS_TYPE_REMOTE

## 2014-01-22 NOTE — Progress Notes (Signed)
Loop recorder 

## 2014-01-23 ENCOUNTER — Encounter: Payer: Self-pay | Admitting: Internal Medicine

## 2014-01-28 ENCOUNTER — Ambulatory Visit (INDEPENDENT_AMBULATORY_CARE_PROVIDER_SITE_OTHER): Payer: BC Managed Care – PPO | Admitting: Internal Medicine

## 2014-01-28 ENCOUNTER — Encounter: Payer: Self-pay | Admitting: Internal Medicine

## 2014-01-28 ENCOUNTER — Other Ambulatory Visit (INDEPENDENT_AMBULATORY_CARE_PROVIDER_SITE_OTHER): Payer: BC Managed Care – PPO

## 2014-01-28 VITALS — BP 152/92 | HR 69 | Temp 98.5°F | Ht 69.0 in | Wt 174.8 lb

## 2014-01-28 DIAGNOSIS — Z0189 Encounter for other specified special examinations: Secondary | ICD-10-CM

## 2014-01-28 DIAGNOSIS — Z0000 Encounter for general adult medical examination without abnormal findings: Secondary | ICD-10-CM

## 2014-01-28 DIAGNOSIS — I10 Essential (primary) hypertension: Secondary | ICD-10-CM

## 2014-01-28 DIAGNOSIS — G4734 Idiopathic sleep related nonobstructive alveolar hypoventilation: Secondary | ICD-10-CM

## 2014-01-28 DIAGNOSIS — Z23 Encounter for immunization: Secondary | ICD-10-CM

## 2014-01-28 LAB — BASIC METABOLIC PANEL
BUN: 17 mg/dL (ref 6–23)
CO2: 23 mEq/L (ref 19–32)
Calcium: 9.2 mg/dL (ref 8.4–10.5)
Chloride: 107 mEq/L (ref 96–112)
Creatinine, Ser: 0.8 mg/dL (ref 0.4–1.2)
GFR: 97.53 mL/min (ref >60.00)
Glucose, Bld: 197 mg/dL — ABNORMAL HIGH (ref 70–99)
Potassium: 3.3 mEq/L — ABNORMAL LOW (ref 3.5–5.1)
Sodium: 138 mEq/L (ref 135–145)

## 2014-01-28 LAB — URINALYSIS, ROUTINE W REFLEX MICROSCOPIC
Bilirubin Urine: NEGATIVE
Hgb urine dipstick: NEGATIVE
Ketones, ur: NEGATIVE
Leukocytes, UA: NEGATIVE
Nitrite: NEGATIVE
Specific Gravity, Urine: 1.025 (ref 1.000–1.030)
Total Protein, Urine: 30 — AB
Urine Glucose: NEGATIVE
Urobilinogen, UA: 0.2 (ref 0.0–1.0)
pH: 5.5 (ref 5.0–8.0)

## 2014-01-28 LAB — CBC WITH DIFFERENTIAL/PLATELET
Basophils Absolute: 0 10*3/uL (ref 0.0–0.1)
Basophils Relative: 0.5 % (ref 0.0–3.0)
Eosinophils Absolute: 0.1 10*3/uL (ref 0.0–0.7)
Eosinophils Relative: 1.7 % (ref 0.0–5.0)
HCT: 40.5 % (ref 36.0–46.0)
Hemoglobin: 13.2 g/dL (ref 12.0–15.0)
Lymphocytes Relative: 27.1 % (ref 12.0–46.0)
Lymphs Abs: 1.8 10*3/uL (ref 0.7–4.0)
MCHC: 32.7 g/dL (ref 30.0–36.0)
MCV: 78.3 fl (ref 78.0–100.0)
Monocytes Absolute: 0.4 10*3/uL (ref 0.1–1.0)
Monocytes Relative: 5.3 % (ref 3.0–12.0)
Neutro Abs: 4.4 10*3/uL (ref 1.4–7.7)
Neutrophils Relative %: 65.4 % (ref 43.0–77.0)
Platelets: 223 10*3/uL (ref 150.0–400.0)
RBC: 5.17 Mil/uL — ABNORMAL HIGH (ref 3.87–5.11)
RDW: 16.7 % — ABNORMAL HIGH (ref 11.5–15.5)
WBC: 6.8 10*3/uL (ref 4.0–10.5)

## 2014-01-28 LAB — LIPID PANEL
Cholesterol: 196 mg/dL (ref 0–200)
HDL: 37.5 mg/dL — ABNORMAL LOW (ref >39.00)
NonHDL: 158.5
Total CHOL/HDL Ratio: 5
Triglycerides: 675 mg/dL — ABNORMAL HIGH (ref 0.0–149.0)
VLDL: 135 mg/dL — ABNORMAL HIGH (ref 0.0–40.0)

## 2014-01-28 LAB — TSH: TSH: 0.51 u[IU]/mL (ref 0.35–4.50)

## 2014-01-28 LAB — HEPATIC FUNCTION PANEL
ALT: 18 U/L (ref 0–35)
AST: 16 U/L (ref 0–37)
Albumin: 4.1 g/dL (ref 3.5–5.2)
Alkaline Phosphatase: 140 U/L — ABNORMAL HIGH (ref 39–117)
Bilirubin, Direct: 0 mg/dL (ref 0.0–0.3)
Total Bilirubin: 0.3 mg/dL (ref 0.2–1.2)
Total Protein: 7.1 g/dL (ref 6.0–8.3)

## 2014-01-28 LAB — LDL CHOLESTEROL, DIRECT: Direct LDL: 79.5 mg/dL

## 2014-01-28 MED ORDER — AMLODIPINE BESYLATE 10 MG PO TABS: 10.0000 mg | ORAL_TABLET | Freq: Every day | ORAL | Status: DC

## 2014-01-28 MED ORDER — AMLODIPINE BESYLATE 5 MG PO TABS: 5.0000 mg | ORAL_TABLET | Freq: Every day | ORAL | Status: DC

## 2014-01-28 NOTE — Progress Notes (Signed)
Pre visit review using our clinic review tool, if applicable. No additional management support is needed unless otherwise documented below in the visit note. 

## 2014-01-28 NOTE — Progress Notes (Signed)
Subjective:    Patient ID: Stacey [name], female    DOB: 09-23-1950, 64 y.o.   MRN: [id number]  HPI  Here for wellness and f/u;  Overall doing ok;  Pt denies CP, worsening SOB, DOE, wheezing, orthopnea, PND, worsening LE edema, palpitations, dizziness or syncope.  Pt denies neurological change such as new headache, facial or extremity weakness.  Pt denies polydipsia, polyuria, or low sugar symptoms. Pt states overall good compliance with treatment and medications, good tolerability, and has been trying to follow lower cholesterol diet.  Pt denies worsening depressive symptoms, suicidal ideation or panic. No fever, night sweats, wt loss, loss of appetite, or other constitutional symptoms.  Pt states good ability with ADL's, has low fall risk, home safety reviewed and adequate, no other significant changes in hearing or vision, and only occasionally active with exercise. Home oxygen machine broken at home. Using for nocturnal desat's - on 2L chronically. No current complaints Past Medical History  Diagnosis Date  . HYPERLIPIDEMIA 08/01/2006  . OBESITY 09/16/2006  . SICKLE CELL ANEMIA 08/01/2006  . ANXIETY 09/16/2006  . DEPRESSION 09/16/2006  . HYPERTENSION 08/01/2006  . 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 s/p septal myomectomy 4/12 at [place] [facility] with Dr. Evelina Dun;  b. echo 5/12: EF 60-65%, LVOT peak 18 mmHg; grade 1 diast dysfxn, mild SAM (improved since myomectomy;  c. 03/2012 Echo: EF 60-65%, mod-sev basal septal asymm hypertrophy, basal septal HK, LVOT grad 79mHg, Gr 1 DD, , SAM, Mild MR, nl RV, PASP 361mg; 08/2012 Echo: technically difficult, doubt LVOT obstruction, EF 60%, Gr 1 DD, mod-sev dil LA.  . BRADYCARDIA 10/13/2009  . ALLERGIC RHINITIS 12/07/2006  . ASTHMA 08/01/2006  . VENTRAL HERNIA 12/07/2006  . ANGIOEDEMA 03/07/2008  . ABDOMINAL WALL CONTUSION 02/18/2010  . Wheezing 04/01/2010  . DIVERTICULOSIS, COLON, WITH HEMORRHAGE 04/06/2010  . RECTAL BLEEDING 04/06/2010  . CAD  (coronary artery disease)     a. 01/2010 : Minimal plaque at cardiac catheterization - CFX 20%, EF 70%;  b. 08/2012 Cath: Nl Cors, EF 65%.  . Marland KitchenBBB (left bundle branch block) 06/17/2010  . Chronic diastolic CHF (congestive heart failure) 04/06/2010    a. In setting of HOCM.  . Marland Kitchenmpaired glucose tolerance 06/25/2010  . DM (diabetes mellitus) in pregnancy, delivered w/postpartum condition 08/30/2010  . Type II or unspecified type diabetes mellitus without mention of complication, uncontrolled 08/30/2010  . COPD (chronic obstructive pulmonary disease)   . Frequent PVCs     a. Medtronic LINQ Reveal Loop Recorder  Serial Number  [id number]   . Renal artery aneurysm    Past Surgical History  Procedure Laterality Date  . Abdominal hysterectomy  1987  . Ventral hernia repair  06/2006  . Myomectomy      Septal  . Left heart cath  Aug. 18, 2014    Medtronic heart device  . Cardiac surgery    . Left heart catheterization with coronary angiogram N/A 09/10/2012    Procedure: LEFT HEART CATHETERIZATION WITH CORONARY ANGIOGRAM;  Surgeon: ChBurnell BlanksMD;  [facility];  Service: Cardiovascular;  Laterality: N/A;  . Electrophysiology study N/A 09/12/2012    Procedure: ELECTROPHYSIOLOGY STUDY;  Surgeon: StDeboraha SprangMD;  [facility];  Service: Cardiovascular;  Laterality: N/A;    reports that she quit smoking about 16 years ago. Her smoking use included Cigarettes. She has a 5 pack-year smoking history. She has never used smokeless tobacco. She reports that she does not drink alcohol or  use illicit drugs. family history includes Cancer in her daughter; Clotting disorder in her daughter and other; Colon cancer in her daughter; Diabetes in her paternal grandmother; Heart attack in her father and sister; Heart disease in her daughter and father; Hyperlipidemia in her brother, daughter, and sister; Hypertension in her brother, father, sister, and son; Stroke in her sister. Allergies    Allergen Reactions  . Dairy Aid [Lactase] Diarrhea and Other (See Comments)    Bloating and gastric distress  . Ace Inhibitors Other (See Comments)    REACTION: angioedema right eye  . Aspirin Other (See Comments)    Bleeding   . Codeine Other (See Comments)    Hallucinations, can take if with someone.  . Soy Allergy Other (See Comments)    Bleeding & cramps   Current Outpatient Prescriptions on File Prior to Visit  Medication Sig Dispense Refill  . albuterol (PROVENTIL HFA;VENTOLIN HFA) 108 (90 BASE) MCG/ACT inhaler Inhale 2 puffs into the lungs every 6 (six) hours as needed for wheezing or shortness of breath. 18 g 2  . atorvastatin (LIPITOR) 10 MG tablet Take 10 mg by mouth daily.    . cetirizine (ZYRTEC) 10 MG tablet Take 10 mg by mouth daily.      . fluticasone (FLONASE) 50 MCG/ACT nasal spray Place 2 sprays into the nose daily. 16 g 6  . irbesartan (AVAPRO) 300 MG tablet Take 300 mg by mouth at bedtime.    . metoprolol succinate (TOPROL-XL) 100 MG 24 hr tablet Take 100 mg by mouth daily. Take with or immediately following a meal.    . oxcarbazepine (TRILEPTAL) 600 MG tablet Take 600 mg by mouth 2 (two) times daily.    . pantoprazole (PROTONIX) 40 MG tablet Take 1 tablet (40 mg total) by mouth daily. 30 tablet 0  . sucralfate (CARAFATE) 1 GM/10ML suspension Take 10 mLs (1 g total) by mouth 4 (four) times daily -  with meals and at bedtime. 420 mL 0  . topiramate (TOPAMAX) 200 MG tablet Take 100 mg by mouth at bedtime. Tapering off currently on 100 mg then will reduce to 50 mg then reduce to 25 mg    . albuterol (PROVENTIL HFA;VENTOLIN HFA) 108 (90 BASE) MCG/ACT inhaler Inhale 2 puffs into the lungs every 4 (four) hours as needed for wheezing. 1 Inhaler 1   No current facility-administered medications on file prior to visit.   Review of Systems Constitutional: Negative for increased diaphoresis, other activity, appetite or other siginficant weight change  HENT: Negative for  worsening hearing loss, ear pain, facial swelling, mouth sores and neck stiffness.   Eyes: Negative for other worsening pain, redness or visual disturbance.  Respiratory: Negative for shortness of breath and wheezing.   Cardiovascular: Negative for chest pain and palpitations.  Gastrointestinal: Negative for diarrhea, blood in stool, abdominal distention or other pain Genitourinary: Negative for hematuria, flank pain or change in urine volume.  Musculoskeletal: Negative for myalgias or other joint complaints.  Skin: Negative for color change and wound.  Neurological: Negative for syncope and numbness. other than noted Hematological: Negative for adenopathy. or other swelling Psychiatric/Behavioral: Negative for hallucinations, self-injury, decreased concentration or other worsening agitation.      Objective:   Physical Exam BP 152/92 mmHg  Pulse 69  Temp(Src) 98.5 F (36.9 C) (Oral)  Ht 5' 9"$  (1.753 m)  Wt 174 lb 12 oz (79.266 kg)  BMI 25.79 kg/m2  SpO2 94% VS noted,  Constitutional: Pt is oriented to person, place,  and time. Appears well-developed and well-nourished.  Head: Normocephalic and atraumatic.  Right Ear: External ear normal.  Left Ear: External ear normal.  Nose: Nose normal.  Mouth/Throat: Oropharynx is clear and moist.  Eyes: Conjunctivae and EOM are normal. Pupils are equal, round, and reactive to light.  Neck: Normal range of motion. Neck supple. No JVD present. No tracheal deviation present.  Cardiovascular: Normal rate, regular rhythm, normal heart sounds and intact distal pulses.   Pulmonary/Chest: Effort normal and breath sounds without rales or wheezing  Abdominal: Soft. Bowel sounds are normal. NT. No HSM  Musculoskeletal: Normal range of motion. Exhibits no edema.  Lymphadenopathy:  Has no cervical adenopathy.  Neurological: Pt is alert and oriented to person, place, and time. Pt has normal reflexes. No cranial nerve deficit. Motor grossly intact Skin:  Skin is warm and dry. No rash noted.  Psychiatric:  Has nervous mood and affect. Behavior is normal.    BP Readings from Last 3 Encounters:  01/28/14 152/92  12/09/13 154/58  12/04/13 140/82   Wt Readings from Last 3 Encounters:  01/28/14 174 lb 12 oz (79.266 kg)  12/09/13 173 lb (78.472 kg)  12/04/13 171 lb (77.565 kg)        Assessment & Plan:

## 2014-01-28 NOTE — Patient Instructions (Addendum)
You had the Pneumovax shot today  OK to increase the amlodipine to 10 mg per day  Please continue all other medications as before, and refills have been done if requested.  Please have the pharmacy call with any other refills you may need.  Please continue your efforts at being more active, low cholesterol diet, and weight control.  You are otherwise up to date with prevention measures today.  Please keep your appointments with your specialists as you may have planned  You will be contacted regarding the referral for: mammogram, and the [facility]  Please go to the LAB in the Basement (turn left off the elevator) for the tests to be done today  You will be contacted by phone if any changes need to be made immediately.  Otherwise, you will receive a letter about your results with an explanation, but please check with MyChart first.  Please remember to sign up for MyChart if you have not done so, as this will be important to you in the future with finding out test results, communicating by private email, and scheduling acute appointments online when needed.  Please return in 6 months, or sooner if needed

## 2014-01-29 ENCOUNTER — Other Ambulatory Visit: Payer: Self-pay | Admitting: Internal Medicine

## 2014-01-29 ENCOUNTER — Telehealth: Payer: Self-pay | Admitting: Internal Medicine

## 2014-01-29 DIAGNOSIS — E876 Hypokalemia: Secondary | ICD-10-CM

## 2014-01-29 MED ORDER — FENOFIBRATE 145 MG PO TABS: 145.0000 mg | ORAL_TABLET | Freq: Every day | ORAL | Status: DC

## 2014-01-29 MED ORDER — POTASSIUM CHLORIDE ER 10 MEQ PO TBCR: EXTENDED_RELEASE_TABLET | ORAL | Status: DC

## 2014-01-29 MED ORDER — POTASSIUM CHLORIDE CRYS ER 10 MEQ PO TBCR
EXTENDED_RELEASE_TABLET | ORAL | Status: DC
Start: 2014-01-29 — End: 2015-07-23

## 2014-01-29 NOTE — Telephone Encounter (Signed)
[name] to also ask pt to return in 1 wk for repeat BMET (potassium)  The triglycerides can be checked at her next visit

## 2014-01-29 NOTE — Telephone Encounter (Signed)
Patient informed. 

## 2014-01-29 NOTE — Telephone Encounter (Signed)
Called the patient back to inform to return in 1 week to the lab to do BMET

## 2014-01-29 NOTE — Telephone Encounter (Signed)
Called left message to call back 

## 2014-01-29 NOTE — Telephone Encounter (Signed)
Needs call back in regards to script carnification

## 2014-01-29 NOTE — Telephone Encounter (Signed)
Please give patient a call in regards to what you were calling about earlier.

## 2014-01-29 NOTE — Telephone Encounter (Signed)
Pharmacist informed PCP changed potasium script per results 10 meq 2 daily.

## 2014-01-30 DIAGNOSIS — G4734 Idiopathic sleep related nonobstructive alveolar hypoventilation: Secondary | ICD-10-CM | POA: Insufficient documentation

## 2014-01-30 NOTE — Assessment & Plan Note (Signed)
Uncontrolled, to add amlodpine 5 qd

## 2014-01-30 NOTE — Assessment & Plan Note (Signed)

## 2014-01-30 NOTE — Assessment & Plan Note (Signed)
For home o2

## 2014-02-01 NOTE — Addendum Note (Signed)
Addended by: Corwin LevinsJOHN, Nicoya Friel W on: 02/01/2014 09:00 PM   Modules accepted: Kipp BroodSmartSet

## 2014-02-07 ENCOUNTER — Other Ambulatory Visit (INDEPENDENT_AMBULATORY_CARE_PROVIDER_SITE_OTHER): Payer: BC Managed Care – PPO

## 2014-02-07 DIAGNOSIS — E876 Hypokalemia: Secondary | ICD-10-CM

## 2014-02-07 LAB — BASIC METABOLIC PANEL
BUN: 13 mg/dL (ref 6–23)
CO2: 26 mEq/L (ref 19–32)
Calcium: 9.7 mg/dL (ref 8.4–10.5)
Chloride: 108 mEq/L (ref 96–112)
Creatinine, Ser: 0.75 mg/dL (ref 0.40–1.20)
GFR: 100.53 mL/min (ref >60.00)
Glucose, Bld: 94 mg/dL (ref 70–99)
Potassium: 4.1 mEq/L (ref 3.5–5.1)
Sodium: 141 mEq/L (ref 135–145)

## 2014-02-07 LAB — MDC_IDC_ENUM_SESS_TYPE_REMOTE

## 2014-02-10 ENCOUNTER — Other Ambulatory Visit: Payer: Self-pay | Admitting: Internal Medicine

## 2014-02-10 DIAGNOSIS — Z1231 Encounter for screening mammogram for malignant neoplasm of breast: Secondary | ICD-10-CM

## 2014-02-14 ENCOUNTER — Ambulatory Visit: Payer: BC Managed Care – PPO

## 2014-02-17 ENCOUNTER — Ambulatory Visit
Admission: RE | Admit: 2014-02-17 | Discharge: 2014-02-17 | Disposition: A | Discharge status: Home / Self Care | Payer: BC Managed Care – PPO | Source: Ambulatory Visit | Attending: Internal Medicine | Admitting: Internal Medicine

## 2014-02-17 DIAGNOSIS — Z1231 Encounter for screening mammogram for malignant neoplasm of breast: Secondary | ICD-10-CM

## 2014-02-20 ENCOUNTER — Ambulatory Visit (INDEPENDENT_AMBULATORY_CARE_PROVIDER_SITE_OTHER): Payer: BC Managed Care – PPO | Admitting: *Deleted

## 2014-02-20 DIAGNOSIS — R002 Palpitations: Secondary | ICD-10-CM

## 2014-02-20 LAB — MDC_IDC_ENUM_SESS_TYPE_REMOTE: Date Time Interrogation Session: 20160129050500

## 2014-02-20 NOTE — Progress Notes (Signed)
Loop recorder 

## 2014-02-21 ENCOUNTER — Ambulatory Visit (INDEPENDENT_AMBULATORY_CARE_PROVIDER_SITE_OTHER): Payer: BC Managed Care – PPO | Admitting: Internal Medicine

## 2014-02-21 ENCOUNTER — Telehealth: Payer: Self-pay | Admitting: Internal Medicine

## 2014-02-21 ENCOUNTER — Encounter: Payer: Self-pay | Admitting: *Deleted

## 2014-02-21 ENCOUNTER — Encounter: Payer: Self-pay | Admitting: Internal Medicine

## 2014-02-21 VITALS — BP 152/88 | HR 60

## 2014-02-21 DIAGNOSIS — R55 Syncope and collapse: Secondary | ICD-10-CM

## 2014-02-21 DIAGNOSIS — I442 Atrioventricular block, complete: Secondary | ICD-10-CM

## 2014-02-21 DIAGNOSIS — Z01812 Encounter for preprocedural laboratory examination: Secondary | ICD-10-CM

## 2014-02-21 LAB — CBC WITH DIFFERENTIAL/PLATELET
Basophils Absolute: 0 10*3/uL (ref 0.0–0.1)
Basophils Relative: 0.5 % (ref 0.0–3.0)
Eosinophils Absolute: 0.1 10*3/uL (ref 0.0–0.7)
Eosinophils Relative: 1.4 % (ref 0.0–5.0)
HCT: 39.6 % (ref 36.0–46.0)
Hemoglobin: 13.3 g/dL (ref 12.0–15.0)
Lymphocytes Relative: 26.7 % (ref 12.0–46.0)
Lymphs Abs: 1.7 10*3/uL (ref 0.7–4.0)
MCHC: 33.5 g/dL (ref 30.0–36.0)
MCV: 76.8 fl — ABNORMAL LOW (ref 78.0–100.0)
Monocytes Absolute: 0.3 10*3/uL (ref 0.1–1.0)
Monocytes Relative: 4.6 % (ref 3.0–12.0)
Neutro Abs: 4.4 10*3/uL (ref 1.4–7.7)
Neutrophils Relative %: 66.8 % (ref 43.0–77.0)
Platelets: 227 10*3/uL (ref 150.0–400.0)
RBC: 5.16 Mil/uL — ABNORMAL HIGH (ref 3.87–5.11)
RDW: 15.9 % — ABNORMAL HIGH (ref 11.5–15.5)
WBC: 6.5 10*3/uL (ref 4.0–10.5)

## 2014-02-21 LAB — BASIC METABOLIC PANEL
BUN: 18 mg/dL (ref 6–23)
CO2: 25 mEq/L (ref 19–32)
Calcium: 9.8 mg/dL (ref 8.4–10.5)
Chloride: 108 mEq/L (ref 96–112)
Creatinine, Ser: 0.82 mg/dL (ref 0.40–1.20)
GFR: 90.68 mL/min (ref >60.00)
Glucose, Bld: 123 mg/dL — ABNORMAL HIGH (ref 70–99)
Potassium: 4.1 mEq/L (ref 3.5–5.1)
Sodium: 140 mEq/L (ref 135–145)

## 2014-02-21 NOTE — Progress Notes (Signed)
Patient Care Team: Biagio Borg, MD as PCP - General (Internal Medicine)   HPI  Stacey [name] is a 64 y.o. female Seen in followup for her in the recorder implanted for palpitations and near syncope in the setting of hypertrophic heart myopathy with prior myectomy  She has had recurrent palpitations and presyncope underwent implantable loop recorder insertion.   Yesterday while at home she bent over. Upon standing up she saw a bright light and ended up on the ground. Event recorder interrogation demonstrated intermittent complete heart block Palpitations>>NSR Past Medical History  Diagnosis Date  . HYPERLIPIDEMIA 08/01/2006  . OBESITY 09/16/2006  . SICKLE CELL ANEMIA 08/01/2006  . ANXIETY 09/16/2006  . DEPRESSION 09/16/2006  . HYPERTENSION 08/01/2006  . 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 s/p septal myomectomy 4/12 at [place] with Dr. Evelina Dun;  b. echo 5/12: EF 60-65%, LVOT peak 18 mmHg; grade 1 diast dysfxn, mild SAM (improved since myomectomy;  c. 03/2012 Echo: EF 60-65%, mod-sev basal septal asymm hypertrophy, basal septal HK, LVOT grad 25mHg, Gr 1 DD, , SAM, Mild MR, nl RV, PASP 330mg; 08/2012 Echo: technically difficult, doubt LVOT obstruction, EF 60%, Gr 1 DD, mod-sev dil LA.  . BRADYCARDIA 10/13/2009  . ALLERGIC RHINITIS 12/07/2006  . ASTHMA 08/01/2006  . VENTRAL HERNIA 12/07/2006  . ANGIOEDEMA 03/07/2008  . ABDOMINAL WALL CONTUSION 02/18/2010  . Wheezing 04/01/2010  . DIVERTICULOSIS, COLON, WITH HEMORRHAGE 04/06/2010  . RECTAL BLEEDING 04/06/2010  . CAD (coronary artery disease)     a. 01/2010 : Minimal plaque at cardiac catheterization - CFX 20%, EF 70%;  b. 08/2012 Cath: Nl Cors, EF 65%.  . Marland KitchenBBB (left bundle branch block) 06/17/2010  . Chronic diastolic CHF (congestive heart failure) 04/06/2010    a. In setting of HOCM.  . Marland Kitchenmpaired glucose tolerance 06/25/2010  . DM (diabetes mellitus) in pregnancy, delivered w/postpartum condition 08/30/2010  . Type II  or unspecified type diabetes mellitus without mention of complication, uncontrolled 08/30/2010  . COPD (chronic obstructive pulmonary disease)   . Frequent PVCs     a. Medtronic LINQ Reveal Loop Recorder  Serial Number  [id number]   . Renal artery aneurysm     Past Surgical History  Procedure Laterality Date  . Abdominal hysterectomy  1987  . Ventral hernia repair  06/2006  . Myomectomy      Septal  . Left heart cath  Aug. 18, 2014    Medtronic heart device  . Cardiac surgery    . Left heart catheterization with coronary angiogram N/A 09/10/2012    Procedure: LEFT HEART CATHETERIZATION WITH CORONARY ANGIOGRAM;  Surgeon: ChBurnell BlanksMD;  [facility];  Service: Cardiovascular;  Laterality: N/A;  . Electrophysiology study N/A 09/12/2012    Procedure: ELECTROPHYSIOLOGY STUDY;  Surgeon: StDeboraha SprangMD;  [facility];  Service: Cardiovascular;  Laterality: N/A;    Current Outpatient Prescriptions  Medication Sig Dispense Refill  . albuterol (PROVENTIL HFA;VENTOLIN HFA) 108 (90 BASE) MCG/ACT inhaler Inhale 2 puffs into the lungs every 6 (six) hours as needed for wheezing or shortness of breath. 18 g 2  . amLODipine (NORVASC) 10 MG tablet Take 1 tablet (10 mg total) by mouth daily. 90 tablet 3  . atorvastatin (LIPITOR) 10 MG tablet Take 10 mg by mouth daily.    . cetirizine (ZYRTEC) 10 MG tablet Take 10 mg by mouth daily.      . fenofibrate (TRICOR) 145 MG tablet Take 1  tablet (145 mg total) by mouth daily. 90 tablet 3  . fluticasone (FLONASE) 50 MCG/ACT nasal spray Place 2 sprays into the nose daily. 16 g 6  . irbesartan (AVAPRO) 300 MG tablet Take 300 mg by mouth at bedtime.    . metoprolol succinate (TOPROL-XL) 100 MG 24 hr tablet Take 100 mg by mouth daily. Take with or immediately following a meal.    . oxcarbazepine (TRILEPTAL) 600 MG tablet Take 600 mg by mouth 2 (two) times daily.    . pantoprazole (PROTONIX) 40 MG tablet Take 1 tablet (40 mg total) by  mouth daily. 30 tablet 0  . potassium chloride (K-DUR) 10 MEQ tablet 3 tabs by mouth in the AM 90 tablet 11  . potassium chloride (K-DUR,KLOR-CON) 10 MEQ tablet 2 tabs by mouth daily 180 tablet 3  . sucralfate (CARAFATE) 1 GM/10ML suspension Take 10 mLs (1 g total) by mouth 4 (four) times daily -  with meals and at bedtime. 420 mL 0  . topiramate (TOPAMAX) 200 MG tablet Take 100 mg by mouth at bedtime. Tapering off currently on 100 mg then will reduce to 50 mg then reduce to 25 mg    . albuterol (PROVENTIL HFA;VENTOLIN HFA) 108 (90 BASE) MCG/ACT inhaler Inhale 2 puffs into the lungs every 4 (four) hours as needed for wheezing. 1 Inhaler 1   No current facility-administered medications for this visit.    Allergies  Allergen Reactions  . Dairy Aid [Lactase] Diarrhea and Other (See Comments)    Bloating and gastric distress  . Ace Inhibitors Other (See Comments)    REACTION: angioedema right eye  . Aspirin Other (See Comments)    Bleeding   . Codeine Other (See Comments)    Hallucinations, can take if with someone.  . Soy Allergy Other (See Comments)    Bleeding & cramps    Review of Systems negative except from HPI and PMH  Physical Exam BP 152/88 mmHg  Pulse 60 Well developed and nourished in no acute distress HENT normal Neck supple with JVP-flat Clear sternotomnoy Regular rate and rhythm,2/6 murmur Abd-soft with active BS No Clubbing cyanosis edema Skin-warm and dry A & Oriented  Grossly normal sensory and motor function  Loop recorder interrogation demonstrates no intercurrent recorded events  ECG demonstrates sinus rhythm at 60 there is left bundle branch block    intervals 18/16/44 Assessment and  Plan  HCM  Left bundle branch block  Intermittent complete heart block  S/p myectomy  LINQ recorder  Obstructive sleep apnea   Device interrogation has demonstrated intermittent complete heart block associated with her syncope in the context of left bundle  branch block.  This is an irreversible problem in the context of her prior myectomy. She will need to undergo pacing.  The benefits and risks were reviewed including but not limited to death,  perforation, infection, lead dislodgement and device malfunction.  The patient understands agrees and is willing to proceed.  She is advised not to drive.  She carries a diagnosis of obstructive sleep apnea. She is being treated with nocturnal oxygen. This was apparently prescribed after she has nocturnal bradycardia. It is my impression that for bradycardia is a consequence of sleep apnea and the role of oxygen therapy as opposed to CPAP is not at all clear to me. We will look into this.

## 2014-02-21 NOTE — Telephone Encounter (Signed)
New Msg        Pt calling back states she was speaking with device clinic earlier today and still had some concerns.   Please return call.

## 2014-02-21 NOTE — Telephone Encounter (Signed)
Pt seen in clinic with SK.

## 2014-02-21 NOTE — Patient Instructions (Signed)
Your physician recommends that you continue on your current medications as directed. Please refer to the Current Medication list given to you today.  Pre procedure labs today: BMET, CBCD  Your physician has recommended that you have a pacemaker inserted. A pacemaker is a small device that is placed under the skin of your chest or abdomen to help control abnormal heart rhythms. This device uses electrical pulses to prompt the heart to beat at a normal rate. Pacemakers are used to treat heart rhythms that are too slow. Wire (leads) are attached to the pacemaker that goes into the chambers of you heart. This is done in the hospital and usually requires and overnight stay. Please see the instruction sheet given to you today for more information.  Your wound check is scheduled for 03/10/14 at 12:00 pm at [address]

## 2014-02-25 ENCOUNTER — Encounter (HOSPITAL_COMMUNITY): Payer: Self-pay | Admitting: Pharmacy Technician

## 2014-02-25 DIAGNOSIS — I447 Left bundle-branch block, unspecified: Secondary | ICD-10-CM | POA: Diagnosis not present

## 2014-02-25 DIAGNOSIS — Z79899 Other long term (current) drug therapy: Secondary | ICD-10-CM | POA: Diagnosis not present

## 2014-02-25 DIAGNOSIS — I421 Obstructive hypertrophic cardiomyopathy: Secondary | ICD-10-CM | POA: Diagnosis not present

## 2014-02-25 DIAGNOSIS — E785 Hyperlipidemia, unspecified: Secondary | ICD-10-CM | POA: Diagnosis not present

## 2014-02-25 DIAGNOSIS — I5032 Chronic diastolic (congestive) heart failure: Secondary | ICD-10-CM | POA: Diagnosis not present

## 2014-02-25 DIAGNOSIS — I2510 Atherosclerotic heart disease of native coronary artery without angina pectoris: Secondary | ICD-10-CM | POA: Diagnosis not present

## 2014-02-25 DIAGNOSIS — J449 Chronic obstructive pulmonary disease, unspecified: Secondary | ICD-10-CM | POA: Diagnosis not present

## 2014-02-25 DIAGNOSIS — I10 Essential (primary) hypertension: Secondary | ICD-10-CM | POA: Diagnosis not present

## 2014-02-25 DIAGNOSIS — R55 Syncope and collapse: Secondary | ICD-10-CM | POA: Diagnosis not present

## 2014-02-25 DIAGNOSIS — G4733 Obstructive sleep apnea (adult) (pediatric): Secondary | ICD-10-CM | POA: Diagnosis not present

## 2014-02-25 DIAGNOSIS — D571 Sickle-cell disease without crisis: Secondary | ICD-10-CM | POA: Diagnosis not present

## 2014-02-25 DIAGNOSIS — I442 Atrioventricular block, complete: Secondary | ICD-10-CM | POA: Diagnosis present

## 2014-02-25 MED ORDER — MUPIROCIN 2 % EX OINT
1.0000 "application " | TOPICAL_OINTMENT | Freq: Once | CUTANEOUS | Status: AC
  Administered 2014-02-26: 1 via TOPICAL
  Filled 2014-02-25: qty 22

## 2014-02-25 MED ORDER — SODIUM CHLORIDE 0.9 % IV SOLN: 250.0000 mL | INTRAVENOUS | Status: DC

## 2014-02-25 MED ORDER — SODIUM CHLORIDE 0.9 % IV SOLN
INTRAVENOUS | Status: DC
  Administered 2014-02-26: 1000 mL via INTRAVENOUS

## 2014-02-25 MED ORDER — SODIUM CHLORIDE 0.9 % IR SOLN
80.0000 mg | Status: DC
  Filled 2014-02-25: qty 2

## 2014-02-25 MED ORDER — SODIUM CHLORIDE 0.9 % IJ SOLN: 3.0000 mL | Freq: Two times a day (BID) | INTRAMUSCULAR | Status: DC

## 2014-02-25 MED ORDER — CEFAZOLIN SODIUM-DEXTROSE 2-3 GM-% IV SOLR: 2.0000 g | INTRAVENOUS | Status: DC

## 2014-02-25 MED ORDER — SODIUM CHLORIDE 0.9 % IJ SOLN: 3.0000 mL | INTRAMUSCULAR | Status: DC | PRN

## 2014-02-26 ENCOUNTER — Encounter (HOSPITAL_COMMUNITY)
Admission: RE | Disposition: A | Discharge status: Home / Self Care | Payer: Self-pay | Source: Ambulatory Visit | Attending: Internal Medicine

## 2014-02-26 ENCOUNTER — Encounter (HOSPITAL_COMMUNITY): Payer: Self-pay | Admitting: Internal Medicine

## 2014-02-26 ENCOUNTER — Ambulatory Visit (HOSPITAL_COMMUNITY)
Admission: RE | Admit: 2014-02-26 | Discharge: 2014-02-27 | Disposition: A | Discharge status: Home / Self Care | Payer: BC Managed Care – PPO | Source: Ambulatory Visit | Attending: Internal Medicine | Admitting: Internal Medicine

## 2014-02-26 DIAGNOSIS — I2510 Atherosclerotic heart disease of native coronary artery without angina pectoris: Secondary | ICD-10-CM | POA: Insufficient documentation

## 2014-02-26 DIAGNOSIS — I442 Atrioventricular block, complete: Secondary | ICD-10-CM

## 2014-02-26 DIAGNOSIS — I10 Essential (primary) hypertension: Secondary | ICD-10-CM | POA: Insufficient documentation

## 2014-02-26 DIAGNOSIS — I421 Obstructive hypertrophic cardiomyopathy: Secondary | ICD-10-CM | POA: Insufficient documentation

## 2014-02-26 DIAGNOSIS — Z01812 Encounter for preprocedural laboratory examination: Secondary | ICD-10-CM

## 2014-02-26 DIAGNOSIS — I447 Left bundle-branch block, unspecified: Secondary | ICD-10-CM | POA: Insufficient documentation

## 2014-02-26 DIAGNOSIS — Z950 Presence of cardiac pacemaker: Secondary | ICD-10-CM

## 2014-02-26 DIAGNOSIS — Z959 Presence of cardiac and vascular implant and graft, unspecified: Secondary | ICD-10-CM

## 2014-02-26 DIAGNOSIS — I5032 Chronic diastolic (congestive) heart failure: Secondary | ICD-10-CM | POA: Insufficient documentation

## 2014-02-26 DIAGNOSIS — J449 Chronic obstructive pulmonary disease, unspecified: Secondary | ICD-10-CM | POA: Insufficient documentation

## 2014-02-26 DIAGNOSIS — Z79899 Other long term (current) drug therapy: Secondary | ICD-10-CM | POA: Insufficient documentation

## 2014-02-26 DIAGNOSIS — G4733 Obstructive sleep apnea (adult) (pediatric): Secondary | ICD-10-CM | POA: Insufficient documentation

## 2014-02-26 DIAGNOSIS — E785 Hyperlipidemia, unspecified: Secondary | ICD-10-CM | POA: Insufficient documentation

## 2014-02-26 DIAGNOSIS — R55 Syncope and collapse: Secondary | ICD-10-CM

## 2014-02-26 DIAGNOSIS — D571 Sickle-cell disease without crisis: Secondary | ICD-10-CM | POA: Insufficient documentation

## 2014-02-26 HISTORY — DX: Atrioventricular block, complete: I44.2

## 2014-02-26 HISTORY — PX: PERMANENT PACEMAKER INSERTION: SHX5480

## 2014-02-26 LAB — GLUCOSE, CAPILLARY
Glucose-Capillary: 161 mg/dL — ABNORMAL HIGH (ref 70–99)
Glucose-Capillary: 134 mg/dL — ABNORMAL HIGH (ref 70–99)

## 2014-02-26 LAB — SURGICAL PCR SCREEN
MRSA, PCR: NEGATIVE
Staphylococcus aureus: NEGATIVE

## 2014-02-26 SURGERY — PERMANENT PACEMAKER INSERTION
Anesthesia: LOCAL

## 2014-02-26 MED ORDER — SODIUM CHLORIDE 0.9 % IV SOLN
INTRAVENOUS | Status: AC
  Administered 2014-02-26: 12:00:00 via INTRAVENOUS

## 2014-02-26 MED ORDER — MUPIROCIN 2 % EX OINT
TOPICAL_OINTMENT | CUTANEOUS | Status: AC
  Administered 2014-02-26: 1 via TOPICAL
  Filled 2014-02-26: qty 22

## 2014-02-26 MED ORDER — ATORVASTATIN CALCIUM 10 MG PO TABS
10.0000 mg | ORAL_TABLET | Freq: Every day | ORAL | Status: DC
  Filled 2014-02-26: qty 1

## 2014-02-26 MED ORDER — ALBUTEROL SULFATE (2.5 MG/3ML) 0.083% IN NEBU: 2.0000 mL | INHALATION_SOLUTION | RESPIRATORY_TRACT | Status: DC | PRN

## 2014-02-26 MED ORDER — METOPROLOL SUCCINATE ER 100 MG PO TB24
100.0000 mg | ORAL_TABLET | Freq: Every day | ORAL | Status: DC
  Filled 2014-02-26: qty 1

## 2014-02-26 MED ORDER — OXCARBAZEPINE 300 MG PO TABS
600.0000 mg | ORAL_TABLET | Freq: Two times a day (BID) | ORAL | Status: DC
  Administered 2014-02-26: 600 mg via ORAL
  Filled 2014-02-26 (×3): qty 2

## 2014-02-26 MED ORDER — MIDAZOLAM HCL 5 MG/5ML IJ SOLN
INTRAMUSCULAR | Status: AC
Start: 2014-02-26 — End: 2014-02-26
  Filled 2014-02-26: qty 5

## 2014-02-26 MED ORDER — FENTANYL CITRATE 0.05 MG/ML IJ SOLN
INTRAMUSCULAR | Status: AC
  Filled 2014-02-26: qty 2

## 2014-02-26 MED ORDER — PANTOPRAZOLE SODIUM 40 MG PO TBEC: 40.0000 mg | DELAYED_RELEASE_TABLET | Freq: Every day | ORAL | Status: DC

## 2014-02-26 MED ORDER — SUCRALFATE 1 GM/10ML PO SUSP: 1.0000 g | Freq: Three times a day (TID) | ORAL | Status: DC

## 2014-02-26 MED ORDER — IRBESARTAN 300 MG PO TABS
300.0000 mg | ORAL_TABLET | Freq: Every day | ORAL | Status: DC
  Administered 2014-02-26: 300 mg via ORAL
  Filled 2014-02-26 (×2): qty 1

## 2014-02-26 MED ORDER — AMLODIPINE BESYLATE 10 MG PO TABS
10.0000 mg | ORAL_TABLET | Freq: Every day | ORAL | Status: DC
  Filled 2014-02-26: qty 1

## 2014-02-26 MED ORDER — CHLORHEXIDINE GLUCONATE 4 % EX LIQD
60.0000 mL | Freq: Once | CUTANEOUS | Status: DC
  Filled 2014-02-26: qty 60

## 2014-02-26 MED ORDER — POTASSIUM CHLORIDE CRYS ER 10 MEQ PO TBCR
10.0000 meq | EXTENDED_RELEASE_TABLET | Freq: Once | ORAL | Status: AC
  Administered 2014-02-26: 10 meq via ORAL
  Filled 2014-02-26: qty 1

## 2014-02-26 MED ORDER — HEPARIN (PORCINE) IN NACL 2-0.9 UNIT/ML-% IJ SOLN
INTRAMUSCULAR | Status: AC
  Filled 2014-02-26: qty 500

## 2014-02-26 MED ORDER — OXCARBAZEPINE 300 MG PO TABS: 600.0000 mg | ORAL_TABLET | Freq: Two times a day (BID) | ORAL | Status: DC

## 2014-02-26 MED ORDER — ONDANSETRON HCL 4 MG/2ML IJ SOLN: 4.0000 mg | Freq: Four times a day (QID) | INTRAMUSCULAR | Status: DC | PRN

## 2014-02-26 MED ORDER — LIDOCAINE HCL (PF) 1 % IJ SOLN
INTRAMUSCULAR | Status: AC
  Filled 2014-02-26: qty 30

## 2014-02-26 MED ORDER — CEFAZOLIN SODIUM 1-5 GM-% IV SOLN
1.0000 g | Freq: Four times a day (QID) | INTRAVENOUS | Status: AC
  Administered 2014-02-26 – 2014-02-27 (×3): 1 g via INTRAVENOUS
  Filled 2014-02-26 (×4): qty 50

## 2014-02-26 MED ORDER — ACETAMINOPHEN 325 MG PO TABS
325.0000 mg | ORAL_TABLET | ORAL | Status: DC | PRN
  Administered 2014-02-26: 650 mg via ORAL
  Filled 2014-02-26: qty 2

## 2014-02-26 MED ORDER — LORATADINE 10 MG PO TABS
10.0000 mg | ORAL_TABLET | Freq: Every day | ORAL | Status: DC
  Administered 2014-02-26: 10 mg via ORAL
  Filled 2014-02-26 (×2): qty 1

## 2014-02-26 NOTE — CV Procedure (Signed)
Preop DX:: LOOP recorder with LBBB and syncope and documented complete heart block-irreversible  Post op DX:: same  Procedure  dual pacemaker implantation  After routine prep and drape, lidocaine was infiltrated in the prepectoral subclavicular region on the left side an incision was made and carried down to later the prepectoral fascia using electrocautery and sharp dissection a pocket was formed similarly. Hemostasis was obtained.  After this, we turned our attention to gaining accessm to the extrathoracic,left subclavian vein. This was accomplished without difficulty and without the aspiration of air or puncture of the artery. 2 separate venipunctures were accomplished; guidewires were placed and retained and sequentially 7 French sheath through which were  passed an Medtronic MRI compatible 5076 ventricular lead serial number [id number] and an Medtronic MRI compatible 5076 atrial lead serial number [id number] .  The ventricular lead was manipulated to the right ventricular apex with a bipolar R wave was 13.4, the pacing impedance was 1022 the threshold was 1.3 @ 0.5 msec  Current at threshold was   1.2  Ma and the current of injury was  BRISK.  The right atrial lead was manipulated to the right atrial POSTERIOR WALL  with a bipolar P-wave  1.3, the pacing impedance was 699, the threshold 1.7@ 0.5 msec   Current at threshold was 2.5  Ma and the current of injury was BRISK.  The leads were affixed to the prepectoral fascia and attached to a  Medtronic MRI compatible pulse generator serial number [id number].  Hemostasis was obtained. The pocket was copiously irrigated with antibiotic containing saline solution. The leads and the pulse generator were placed in the pocket and affixed to the prepectoral fascia. The wound was then closed in 2 layers in the normal fashion.  The wound was washed dried . And a dermabond adhesive was applied   Needle  Count, sponge counts and instrument counts were correct  at the end of the procedure .   The patient tolerated the procedure without apparent complication.  Maryagnes Amos.D.

## 2014-02-26 NOTE — H&P (View-Only) (Signed)
    Patient Care Team: James W John, MD as PCP - General (Internal Medicine)   HPI  Stacey [name] is a 63 y.o. female Seen in followup for her in the recorder implanted for palpitations and near syncope in the setting of hypertrophic heart myopathy with prior myectomy  She has had recurrent palpitations and presyncope underwent implantable loop recorder insertion.   Yesterday while at home she bent over. Upon standing up she saw a bright light and ended up on the ground. Event recorder interrogation demonstrated intermittent complete heart block Palpitations>>NSR Past Medical History  Diagnosis Date  . HYPERLIPIDEMIA 08/01/2006  . OBESITY 09/16/2006  . SICKLE CELL ANEMIA 08/01/2006  . ANXIETY 09/16/2006  . DEPRESSION 09/16/2006  . HYPERTENSION 08/01/2006  . 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 s/p septal myomectomy 4/12 at [facility] with Dr. Glower;  b. echo 5/12: EF 60-65%, LVOT peak 18 mmHg; grade 1 diast dysfxn, mild SAM (improved since myomectomy;  c. 03/2012 Echo: EF 60-65%, mod-sev basal septal asymm hypertrophy, basal septal HK, LVOT grad 68mmHg, Gr 1 DD, , SAM, Mild MR, nl RV, PASP 35mmHg; 08/2012 Echo: technically difficult, doubt LVOT obstruction, EF 60%, Gr 1 DD, mod-sev dil LA.  . BRADYCARDIA 10/13/2009  . ALLERGIC RHINITIS 12/07/2006  . ASTHMA 08/01/2006  . VENTRAL HERNIA 12/07/2006  . ANGIOEDEMA 03/07/2008  . ABDOMINAL WALL CONTUSION 02/18/2010  . Wheezing 04/01/2010  . DIVERTICULOSIS, COLON, WITH HEMORRHAGE 04/06/2010  . RECTAL BLEEDING 04/06/2010  . CAD (coronary artery disease)     a. 01/2010 : Minimal plaque at cardiac catheterization - CFX 20%, EF 70%;  b. 08/2012 Cath: Nl Cors, EF 65%.  . LBBB (left bundle branch block) 06/17/2010  . Chronic diastolic CHF (congestive heart failure) 04/06/2010    a. In setting of HOCM.  . Impaired glucose tolerance 06/25/2010  . DM (diabetes mellitus) in pregnancy, delivered w/postpartum condition 08/30/2010  . Type II  or unspecified type diabetes mellitus without mention of complication, uncontrolled 08/30/2010  . COPD (chronic obstructive pulmonary disease)   . Frequent PVCs     a. Medtronic LINQ Reveal Loop Recorder  Serial Number  [id number]   . Renal artery aneurysm     Past Surgical History  Procedure Laterality Date  . Abdominal hysterectomy  1987  . Ventral hernia repair  06/2006  . Myomectomy      Septal  . Left heart cath  Aug. 18, 2014    Medtronic heart device  . Cardiac surgery    . Left heart catheterization with coronary angiogram N/A 09/10/2012    Procedure: LEFT HEART CATHETERIZATION WITH CORONARY ANGIOGRAM;  Surgeon: Christopher D McAlhany, MD;  Location: [facility] LAB;  Service: Cardiovascular;  Laterality: N/A;  . Electrophysiology study N/A 09/12/2012    Procedure: ELECTROPHYSIOLOGY STUDY;  Surgeon: Juliann Olesky C Dajour Pierpoint, MD;  Location: [facility] LAB;  Service: Cardiovascular;  Laterality: N/A;    Current Outpatient Prescriptions  Medication Sig Dispense Refill  . albuterol (PROVENTIL HFA;VENTOLIN HFA) 108 (90 BASE) MCG/ACT inhaler Inhale 2 puffs into the lungs every 6 (six) hours as needed for wheezing or shortness of breath. 18 g 2  . amLODipine (NORVASC) 10 MG tablet Take 1 tablet (10 mg total) by mouth daily. 90 tablet 3  . atorvastatin (LIPITOR) 10 MG tablet Take 10 mg by mouth daily.    . cetirizine (ZYRTEC) 10 MG tablet Take 10 mg by mouth daily.      . fenofibrate (TRICOR) 145 MG tablet Take 1   tablet (145 mg total) by mouth daily. 90 tablet 3  . fluticasone (FLONASE) 50 MCG/ACT nasal spray Place 2 sprays into the nose daily. 16 g 6  . irbesartan (AVAPRO) 300 MG tablet Take 300 mg by mouth at bedtime.    . metoprolol succinate (TOPROL-XL) 100 MG 24 hr tablet Take 100 mg by mouth daily. Take with or immediately following a meal.    . oxcarbazepine (TRILEPTAL) 600 MG tablet Take 600 mg by mouth 2 (two) times daily.    . pantoprazole (PROTONIX) 40 MG tablet Take 1 tablet (40 mg total) by  mouth daily. 30 tablet 0  . potassium chloride (K-DUR) 10 MEQ tablet 3 tabs by mouth in the AM 90 tablet 11  . potassium chloride (K-DUR,KLOR-CON) 10 MEQ tablet 2 tabs by mouth daily 180 tablet 3  . sucralfate (CARAFATE) 1 GM/10ML suspension Take 10 mLs (1 g total) by mouth 4 (four) times daily -  with meals and at bedtime. 420 mL 0  . topiramate (TOPAMAX) 200 MG tablet Take 100 mg by mouth at bedtime. Tapering off currently on 100 mg then will reduce to 50 mg then reduce to 25 mg    . albuterol (PROVENTIL HFA;VENTOLIN HFA) 108 (90 BASE) MCG/ACT inhaler Inhale 2 puffs into the lungs every 4 (four) hours as needed for wheezing. 1 Inhaler 1   No current facility-administered medications for this visit.    Allergies  Allergen Reactions  . Dairy Aid [Lactase] Diarrhea and Other (See Comments)    Bloating and gastric distress  . Ace Inhibitors Other (See Comments)    REACTION: angioedema right eye  . Aspirin Other (See Comments)    Bleeding   . Codeine Other (See Comments)    Hallucinations, can take if with someone.  . Soy Allergy Other (See Comments)    Bleeding & cramps    Review of Systems negative except from HPI and PMH  Physical Exam BP 152/88 mmHg  Pulse 60 Well developed and nourished in no acute distress HENT normal Neck supple with JVP-flat Clear sternotomnoy Regular rate and rhythm,2/6 murmur Abd-soft with active BS No Clubbing cyanosis edema Skin-warm and dry A & Oriented  Grossly normal sensory and motor function  Loop recorder interrogation demonstrates no intercurrent recorded events  ECG demonstrates sinus rhythm at 60 there is left bundle branch block    intervals 18/16/44 Assessment and  Plan  HCM  Left bundle branch block  Intermittent complete heart block  S/p myectomy  LINQ recorder  Obstructive sleep apnea   Device interrogation has demonstrated intermittent complete heart block associated with her syncope in the context of left bundle  branch block.  This is an irreversible problem in the context of her prior myectomy. She will need to undergo pacing.  The benefits and risks were reviewed including but not limited to death,  perforation, infection, lead dislodgement and device malfunction.  The patient understands agrees and is willing to proceed.  She is advised not to drive.  She carries a diagnosis of obstructive sleep apnea. She is being treated with nocturnal oxygen. This was apparently prescribed after she has nocturnal bradycardia. It is my impression that for bradycardia is a consequence of sleep apnea and the role of oxygen therapy as opposed to CPAP is not at all clear to me. We will look into this. 

## 2014-02-26 NOTE — CV Procedure (Signed)
Orlene ErmChristine O Crawford-Fewell [id number]  [id number]  Preop Dx: LOOP RECORDER IN SITU DOCUMENTED AV BLOCK ASSOC WITH SYNCOPE Postop Dx same/   Procedure:LOOP RECORDER EXPLANT  After prep for pacemaker and implant of same, lido was infused over the loop recorder and an 0.5 sm incision made with explantation of the device  4.0 suture placed to secure the wound and demrabond dressing was applied  Cx: None  EBL: Minimal    Sherryl MangesSteven Klein, MD 02/26/2014 10:20 AM

## 2014-02-26 NOTE — Progress Notes (Signed)
Pt received from EP procedure alert and able to tolerated fluids.  Pt waiting for  Tele bed assignment.

## 2014-02-26 NOTE — Interval H&P Note (Signed)
History and Physical Interval Note:  02/26/2014 8:20 AM  Stacey [name]  has presented today for surgery, with the diagnosis of complete heart block  The various methods of treatment have been discussed with the patient and family. After consideration of risks, benefits and other options for treatment, the patient has consented to  Procedure(s): PERMANENT PACEMAKER INSERTION (N/A) as a surgical intervention .  The patient's history has been reviewed, patient examined, no change in status, stable for surgery.  I have reviewed the patient's chart and labs.  Questions were answered to the patient's satisfaction.     Sherryl MangesSteven Klein

## 2014-02-27 ENCOUNTER — Encounter (HOSPITAL_COMMUNITY): Payer: Self-pay | Admitting: *Deleted

## 2014-02-27 ENCOUNTER — Other Ambulatory Visit: Payer: Self-pay

## 2014-02-27 ENCOUNTER — Ambulatory Visit (HOSPITAL_COMMUNITY): Payer: BC Managed Care – PPO

## 2014-02-27 DIAGNOSIS — I442 Atrioventricular block, complete: Secondary | ICD-10-CM | POA: Diagnosis not present

## 2014-02-27 DIAGNOSIS — I447 Left bundle-branch block, unspecified: Secondary | ICD-10-CM | POA: Diagnosis not present

## 2014-02-27 DIAGNOSIS — E785 Hyperlipidemia, unspecified: Secondary | ICD-10-CM | POA: Diagnosis not present

## 2014-02-27 DIAGNOSIS — R55 Syncope and collapse: Secondary | ICD-10-CM | POA: Diagnosis not present

## 2014-02-27 MED ORDER — POTASSIUM CHLORIDE ER 10 MEQ PO TBCR: 10.0000 meq | EXTENDED_RELEASE_TABLET | Freq: Two times a day (BID) | ORAL | Status: DC

## 2014-02-27 NOTE — Discharge Instructions (Signed)
° °  Supplemental Discharge Instructions for  °Pacemaker/Defibrillator Patients ° °Activity °No heavy lifting or vigorous activity with your left/right arm for 6 to 8 weeks.  Do not raise your left/right arm above your head for one week.  Gradually raise your affected arm as drawn below. ° °        ° °            02/07                   02/08                    02/09                    02/10           ° °NO DRIVING for  1 week    ; you may begin driving on     03/06/2014     . °WOUND CARE °- Keep the wound area clean and dry.  Do not get this area wet for one week. No showers for one week; you may shower on      03/06/2014        . °- The tape/steri-strips on your wound will fall off; do not pull them off.  No bandage is needed on the site.  DO  NOT apply any creams, oils, or ointments to the wound area. °- If you notice any drainage or discharge from the wound, any swelling or bruising at the site, or you develop a fever > 101? F after you are discharged home, call the office at once. ° °Special Instructions °- You are still able to use cellular telephones; use the ear opposite the side where you have your pacemaker/defibrillator.  Avoid carrying your cellular phone near your device. °- When traveling through airports, show security personnel your identification card to avoid being screened in the metal detectors.  Ask the security personnel to use the hand wand. °- Avoid arc welding equipment, MRI testing (magnetic resonance imaging), TENS units (transcutaneous nerve stimulators).  Call the office for questions about other devices. °- Avoid electrical appliances that are in poor condition or are not properly grounded. °- Microwave ovens are safe to be near or to operate. ° °Additional information for defibrillator patients should your device go off: °- If your device goes off ONCE and you feel fine afterward, notify the device clinic nurses. °- If your device goes off ONCE and you do not feel well afterward, call  911. °- If your device goes off TWICE, call 911. °- If your device goes off THREE times in one day, call 911. ° °DO NOT DRIVE YOURSELF OR A FAMILY MEMBER °WITH A DEFIBRILLATOR TO THE HOSPITAL--CALL 911. ° °

## 2014-02-27 NOTE — Progress Notes (Signed)
Pt discharging via wheelchair, escorted by volunteer, accompanied by family. Discharge instructions provided to patient and family, verbalize understanding and able to provide appropriate teach back; Emphasis on surgical site infections, post pacemaker care and activity restrictions. PIV discontinued, site without s/s of complication. Tele box removed and returned to unit. Denies needs or questions at this time.

## 2014-02-27 NOTE — Discharge Summary (Signed)
ELECTROPHYSIOLOGY PROCEDURE DISCHARGE SUMMARY    Patient ID: Stacey [name],  MRN: [id number], DOB/AGE: 63/08/1950 63 y.o.  Admit date: 2/3/Stacey Morin2016 Discharge date: 02/27/2014  Primary Care Physician: Oliver BarreJames John, MD Electrophysiologist: Graciela HusbandsKlein  Primary Discharge Diagnosis:  Syncope with documented heart blockstatus post pacemaker implantation and loop recorder explantation this admission  Secondary Discharge Diagnosis:  1.  Hypertrophic cardiomyopathy status post myomectomy 2.  Hyperlipidemia 3.  Sickle cell anemia 4.  Hypertension 5.  LBBB 6.  Diabetes 7.  COPD  Allergies  Allergen Reactions  . Dairy Aid [Lactase] Diarrhea and Other (See Comments)    Bloating and gastric distress  . Ace Inhibitors Other (See Comments)    REACTION: angioedema right eye  . Aspirin Other (See Comments)    Bleeding   . Codeine Other (See Comments)    Hallucinations, can take if with someone.  . Soy Allergy Other (See Comments)    Bleeding & cramps    Procedures This Admission:  1.  Implantation of a MDT MRI compatible dual chamber PPM on 02-26-14 by Dr Graciela HusbandsKlein.  The patient received a MDT model number Advisa PPM with model number 5076 right atrial lead and 5076 right ventricular lead. There were no immediate post procedure complications.  Her previously implanted loop recorder was explanted.  2.  CXR on 02-27-14 demonstrated no pneumothorax status post device implantation.   Brief HPI: Stacey MorinChristine O [name] is a 64 y.o. female with a past medical history as outlined above.  She underwent implantable loop recorder implant for syncope which demonstrated symptomatic heart block. Risks, benefits, and alternatives to PPM implantation were reviewed with the patient who wished to proceed.   Hospital Course:  The patient was admitted and underwent implantation of a MDT dual chamber pacemaker with details as outlined above.  She  was monitored on telemetry overnight which demonstrated  sinus rhythm.  Left chest was without hematoma or ecchymosis.  The device was interrogated and found to be functioning normally.  CXR was obtained and demonstrated no pneumothorax status post device implantation.  Wound care, arm mobility, and restrictions were reviewed with the patient.  Dr Graciela HusbandsKlein examined the patient and considered them stable for discharge to home.   Discharge Vitals: Blood pressure 156/72, pulse 68, temperature 98.3 F (36.8 C), temperature source Oral, resp. rate 16, height 5\' 9"  (1.753 m), weight 171 lb 8.3 oz (77.8 kg), SpO2 98 %.   Well developed and nourished in no acute distress HENT normal Neck supple with JVP-flat Clear Incisions without hematoma Regular rate and rhythm, no murmurs or gallops Abd-soft with active BS No Clubbing cyanosis edema Skin-warm and dry A & Oriented  Grossly normal sensory and motor function   Labs:   Lab Results  Component Value Date   WBC 6.5 02/21/2014   HGB 13.3 02/21/2014   HCT 39.6 02/21/2014   MCV 76.8* 02/21/2014   PLT 227.0 02/21/2014     Recent Labs Lab 02/21/14 1404  NA 140  K 4.1  CL 108  CO2 25  BUN 18  CREATININE 0.82  CALCIUM 9.8  GLUCOSE 123*    Discharge Medications:    Medication List    ASK your doctor about these medications        albuterol 108 (90 BASE) MCG/ACT inhaler  Commonly known as:  PROVENTIL HFA;VENTOLIN HFA  Inhale 2 puffs into the lungs every 4 (four) hours as needed for wheezing.     albuterol 108 (90 BASE) MCG/ACT inhaler  Commonly  known as:  PROVENTIL HFA;VENTOLIN HFA  Inhale 2 puffs into the lungs every 6 (six) hours as needed for wheezing or shortness of breath.     amLODipine 10 MG tablet  Commonly known as:  NORVASC  Take 1 tablet (10 mg total) by mouth daily.     atorvastatin 10 MG tablet  Commonly known as:  LIPITOR  Take 10 mg by mouth daily.     cetirizine 10 MG tablet  Commonly known as:  ZYRTEC  Take 10 mg by mouth daily.     fenofibrate 145 MG tablet    Commonly known as:  TRICOR  Take 1 tablet (145 mg total) by mouth daily.     fluticasone 50 MCG/ACT nasal spray  Commonly known as:  FLONASE  Place 2 sprays into the nose daily.     irbesartan 300 MG tablet  Commonly known as:  AVAPRO  Take 300 mg by mouth at bedtime.     metoprolol succinate 100 MG 24 hr tablet  Commonly known as:  TOPROL-XL  Take 100 mg by mouth daily. Take with or immediately following a meal.     oxcarbazepine 600 MG tablet  Commonly known as:  TRILEPTAL  Take 600 mg by mouth 2 (two) times daily.     pantoprazole 40 MG tablet  Commonly known as:  PROTONIX  Take 1 tablet (40 mg total) by mouth daily.     potassium chloride 10 MEQ tablet  Commonly known as:  K-DUR  3 tabs by mouth in the AM     potassium chloride 10 MEQ tablet  Commonly known as:  K-DUR,KLOR-CON  2 tabs by mouth daily     sucralfate 1 GM/10ML suspension  Commonly known as:  CARAFATE  Take 10 mLs (1 g total) by mouth 4 (four) times daily -  with meals and at bedtime.     topiramate 200 MG tablet  Commonly known as:  TOPAMAX  Take 100 mg by mouth at bedtime. Tapering off currently on 100 mg then will reduce to 50 mg then reduce to 25 mg        Disposition:   followup with JF Needs discusseion re HCM in family  Duration of Discharge Encounter: Greater than 30 minutes including physician time.  Signed,

## 2014-03-04 ENCOUNTER — Encounter: Payer: Self-pay | Admitting: Internal Medicine

## 2014-03-10 ENCOUNTER — Ambulatory Visit (INDEPENDENT_AMBULATORY_CARE_PROVIDER_SITE_OTHER): Payer: BC Managed Care – PPO | Admitting: *Deleted

## 2014-03-10 ENCOUNTER — Encounter: Payer: Self-pay | Admitting: *Deleted

## 2014-03-10 DIAGNOSIS — I442 Atrioventricular block, complete: Secondary | ICD-10-CM | POA: Diagnosis not present

## 2014-03-10 LAB — MDC_IDC_ENUM_SESS_TYPE_INCLINIC
Battery Voltage: 3.1 V
Brady Statistic AP VP Percent: 0.02 %
Brady Statistic AP VS Percent: 11.16 %
Brady Statistic AS VP Percent: 0.03 %
Brady Statistic AS VS Percent: 88.8 %
Brady Statistic RA Percent Paced: 11.17 %
Brady Statistic RV Percent Paced: 0.05 %
Date Time Interrogation Session: 20160215165037
Lead Channel Impedance Value: 342 Ohm
Lead Channel Impedance Value: 437 Ohm
Lead Channel Impedance Value: 475 Ohm
Lead Channel Impedance Value: 513 Ohm
Lead Channel Pacing Threshold Amplitude: 0.75 V
Lead Channel Pacing Threshold Amplitude: 0.75 V
Lead Channel Pacing Threshold Pulse Width: 0.4 ms
Lead Channel Pacing Threshold Pulse Width: 0.4 ms
Lead Channel Sensing Intrinsic Amplitude: 19.5 mV
Lead Channel Sensing Intrinsic Amplitude: 2.75 mV
Lead Channel Sensing Intrinsic Amplitude: 21.625 mV
Lead Channel Sensing Intrinsic Amplitude: 3.75 mV
Lead Channel Setting Pacing Amplitude: 3.5 V
Lead Channel Setting Pacing Amplitude: 3.5 V
Lead Channel Setting Pacing Pulse Width: 0.4 ms
Lead Channel Setting Sensing Sensitivity: 2.8 mV
Zone Setting Detection Interval: 350 ms
Zone Setting Detection Interval: 400 ms

## 2014-03-10 NOTE — Progress Notes (Signed)
Wound check appointment. Steri-strips removed. Wound without redness or edema. Incision edges approximated, wound well healed. Normal device function. Thresholds, sensing, and impedances consistent with implant measurements. Device programmed at 3.5V for extra safety margin until 3 month visit. Histogram distribution appropriate for patient and level of activity. No mode switches or high ventricular rates noted. Patient educated about wound care, arm mobility, lifting restrictions. ROV in 3 months with SK.

## 2014-03-11 ENCOUNTER — Encounter: Payer: Self-pay | Admitting: Cardiology

## 2014-03-11 ENCOUNTER — Telehealth: Payer: Self-pay | Admitting: Cardiology

## 2014-03-11 NOTE — Telephone Encounter (Signed)
Attempted to call pt to request pt send manual transmission. No answer and unable to leave message.

## 2014-03-21 ENCOUNTER — Encounter: Payer: Self-pay | Admitting: Cardiology

## 2014-03-21 ENCOUNTER — Ambulatory Visit (INDEPENDENT_AMBULATORY_CARE_PROVIDER_SITE_OTHER): Payer: BC Managed Care – PPO | Admitting: *Deleted

## 2014-03-21 DIAGNOSIS — R002 Palpitations: Secondary | ICD-10-CM

## 2014-03-27 ENCOUNTER — Encounter: Payer: Self-pay | Admitting: Internal Medicine

## 2014-03-27 NOTE — Progress Notes (Signed)
Loop recorder 

## 2014-03-28 ENCOUNTER — Encounter: Payer: Self-pay | Admitting: Cardiology

## 2014-04-04 ENCOUNTER — Telehealth: Payer: Self-pay | Admitting: Internal Medicine

## 2014-04-04 ENCOUNTER — Encounter: Payer: Self-pay | Admitting: Cardiology

## 2014-04-04 NOTE — Telephone Encounter (Signed)
Informed patient that I am not sure who called her from this office.  I cannot find documentation of anyone calling her. She verbalized understanding.

## 2014-04-04 NOTE — Telephone Encounter (Signed)
New Msg       Pt returning call from earlier today.     Please call.

## 2014-04-08 ENCOUNTER — Telehealth: Payer: Self-pay | Admitting: Internal Medicine

## 2014-04-08 ENCOUNTER — Encounter: Payer: Self-pay | Admitting: Physician Assistant

## 2014-04-08 ENCOUNTER — Ambulatory Visit (INDEPENDENT_AMBULATORY_CARE_PROVIDER_SITE_OTHER): Payer: BC Managed Care – PPO | Admitting: *Deleted

## 2014-04-08 ENCOUNTER — Ambulatory Visit (INDEPENDENT_AMBULATORY_CARE_PROVIDER_SITE_OTHER): Payer: BC Managed Care – PPO | Admitting: Physician Assistant

## 2014-04-08 VITALS — BP 122/68 | HR 65 | Ht 69.0 in | Wt 175.0 lb

## 2014-04-08 DIAGNOSIS — I5032 Chronic diastolic (congestive) heart failure: Secondary | ICD-10-CM

## 2014-04-08 DIAGNOSIS — I421 Obstructive hypertrophic cardiomyopathy: Secondary | ICD-10-CM

## 2014-04-08 DIAGNOSIS — R42 Dizziness and giddiness: Secondary | ICD-10-CM

## 2014-04-08 DIAGNOSIS — I442 Atrioventricular block, complete: Secondary | ICD-10-CM

## 2014-04-08 DIAGNOSIS — I10 Essential (primary) hypertension: Secondary | ICD-10-CM

## 2014-04-08 DIAGNOSIS — Z950 Presence of cardiac pacemaker: Secondary | ICD-10-CM

## 2014-04-08 DIAGNOSIS — Z9889 Other specified postprocedural states: Secondary | ICD-10-CM

## 2014-04-08 LAB — HEPATIC FUNCTION PANEL
ALT: 21 U/L (ref 0–35)
AST: 18 U/L (ref 0–37)
Albumin: 4.7 g/dL (ref 3.5–5.2)
Alkaline Phosphatase: 112 U/L (ref 39–117)
Bilirubin, Direct: 0.1 mg/dL (ref 0.0–0.3)
Total Bilirubin: 0.3 mg/dL (ref 0.2–1.2)
Total Protein: 7.5 g/dL (ref 6.0–8.3)

## 2014-04-08 LAB — MDC_IDC_ENUM_SESS_TYPE_INCLINIC
Battery Remaining Longevity: 160 mo
Battery Voltage: 3.08 V
Brady Statistic AP VP Percent: 0.02 %
Brady Statistic AP VS Percent: 12.01 %
Brady Statistic AS VP Percent: 0.03 %
Brady Statistic AS VS Percent: 87.94 %
Brady Statistic RA Percent Paced: 12.03 %
Brady Statistic RV Percent Paced: 0.05 %
Date Time Interrogation Session: 20160315112616
Lead Channel Impedance Value: 361 Ohm
Lead Channel Impedance Value: 399 Ohm
Lead Channel Impedance Value: 475 Ohm
Lead Channel Impedance Value: 551 Ohm
Lead Channel Pacing Threshold Amplitude: 0.5 V
Lead Channel Pacing Threshold Amplitude: 0.75 V
Lead Channel Pacing Threshold Pulse Width: 0.4 ms
Lead Channel Pacing Threshold Pulse Width: 0.4 ms
Lead Channel Sensing Intrinsic Amplitude: 18.375 mV
Lead Channel Sensing Intrinsic Amplitude: 3.125 mV
Lead Channel Setting Pacing Amplitude: 3.5 V
Lead Channel Setting Pacing Amplitude: 3.5 V
Lead Channel Setting Pacing Pulse Width: 0.4 ms
Lead Channel Setting Sensing Sensitivity: 2.8 mV
Zone Setting Detection Interval: 350 ms
Zone Setting Detection Interval: 400 ms

## 2014-04-08 LAB — BASIC METABOLIC PANEL
BUN: 14 mg/dL (ref 6–23)
CO2: 28 mEq/L (ref 19–32)
Calcium: 9.7 mg/dL (ref 8.4–10.5)
Chloride: 106 mEq/L (ref 96–112)
Creatinine, Ser: 0.82 mg/dL (ref 0.40–1.20)
GFR: 90.64 mL/min (ref >60.00)
Glucose, Bld: 133 mg/dL — ABNORMAL HIGH (ref 70–99)
Potassium: 4.1 mEq/L (ref 3.5–5.1)
Sodium: 138 mEq/L (ref 135–145)

## 2014-04-08 LAB — CBC WITH DIFFERENTIAL/PLATELET
Basophils Absolute: 0 10*3/uL (ref 0.0–0.1)
Basophils Relative: 0.4 % (ref 0.0–3.0)
Eosinophils Absolute: 0.1 10*3/uL (ref 0.0–0.7)
Eosinophils Relative: 1.4 % (ref 0.0–5.0)
HCT: 38.5 % (ref 36.0–46.0)
Hemoglobin: 12.8 g/dL (ref 12.0–15.0)
Lymphocytes Relative: 26.1 % (ref 12.0–46.0)
Lymphs Abs: 1.8 10*3/uL (ref 0.7–4.0)
MCHC: 33.2 g/dL (ref 30.0–36.0)
MCV: 77.7 fl — ABNORMAL LOW (ref 78.0–100.0)
Monocytes Absolute: 0.4 10*3/uL (ref 0.1–1.0)
Monocytes Relative: 5.9 % (ref 3.0–12.0)
Neutro Abs: 4.5 10*3/uL (ref 1.4–7.7)
Neutrophils Relative %: 66.2 % (ref 43.0–77.0)
Platelets: 249 10*3/uL (ref 150.0–400.0)
RBC: 4.96 Mil/uL (ref 3.87–5.11)
RDW: 14.9 % (ref 11.5–15.5)
WBC: 6.8 10*3/uL (ref 4.0–10.5)

## 2014-04-08 LAB — URINALYSIS, ROUTINE W REFLEX MICROSCOPIC
Bilirubin Urine: NEGATIVE
Hgb urine dipstick: NEGATIVE
Ketones, ur: NEGATIVE
Leukocytes, UA: NEGATIVE
Nitrite: NEGATIVE
Specific Gravity, Urine: 1.02 (ref 1.000–1.030)
Total Protein, Urine: NEGATIVE
Urine Glucose: NEGATIVE
Urobilinogen, UA: 0.2 (ref 0.0–1.0)
pH: 5.5 (ref 5.0–8.0)

## 2014-04-08 LAB — MAGNESIUM: Magnesium: 2.2 mg/dL (ref 1.5–2.5)

## 2014-04-08 NOTE — Telephone Encounter (Addendum)
Patient tells me that she is having sensations of passing out and that she has almost blacked out twice this morning.  This has all happened in the last 30 minutes. She is extremely concerned about how she is feeling. 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 Recent PPM implant (February) secondary to intermittent complete heart block. Denies recent illness, dehydration or any other symptoms. Denies any new medications. Advised patient that I will check with device clinic and our PA to determine seeing patient this morning. She is agreeable and states that she has her cell phone for me to call her back on

## 2014-04-08 NOTE — Telephone Encounter (Signed)
Patient returned my call.  Scheduled patient to see Tereso NewcomerScott Weaver, PA this morning at 11 a.m. Patient advised not to drive or be by herself.  She states she has a friend picking her up now and calling her daughter to meet her here for appt. Device clinic will interrogate patient when she gets here and then see PA for assessment of current complaint. Patient verbalized understanding and agreeable to plan.  She is thankful for me helping so quickly with this.

## 2014-04-08 NOTE — Progress Notes (Signed)
Cardiology Office Note   Date:  04/08/2014   ID:  Stacey [name], DOB Dec 19, 1950, MRN [id number]  PCP:  Cathlean Cower, MD  Cardiologist:  Dr. Minus Breeding previously (only sees Dr. [name] now) Electrophysiologist:  Dr. [name]   Chief Complaint  Patient presents with  . 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    She has a hx of syncope.  ILR was implanted in 2014.  She had recurrent syncope and ILR demonstrated LBBB and CHB.  Therefore, she underwent dual chamber PPM implantation last month.    She had episodes of near syncope while teaching at school today and was added on for evaluation.   She was standing in class talking this morning when she suddenly felt like she was going to pass out.  She was on her feet no more than 4 minutes.  She describes tunnel vision.  This last several minutes.  She still feels lightheaded.  She says she just does not feel good.  Her school's AC was out last week and she was sweaty most days.  She notes a GI illness is going around her school.  She started feeling nauseated yesterday.  She denies vomiting or diarrhea.  She denies chest pain or palpitations. She has chronic DOE (NYHA 2-2b).  She denies any changes.  She denies orthopnea, PND, edema.     Studies/Reports Reviewed Today:  Echo 10/21/13 - Severe LVH. Moderate asymmetric hypertrophy. EF 55-60%. There was dynamic obstruction. Wall motion was normal. Grade 1 diastolic dysfunction  - Mitral valve: There was mild systolic anterior motion. There was moderate regurgitation. - Left atrium: The atrium was mildly dilated. - Right ventricle: The cavity size was mildly dilated. - Right atrium: The atrium was mildly dilated. Impressions: Their is HOCM with resting LVOT gradient of 43  which increases to 172 mmHg with Valsalva.  (Post op Echo at [facility] 4/12 with LVOT 42 at rest and 66 with Valsalva)  Cardiac cath 09/10/12 LM: No obstructive disease.  LAD:  No obstructive disease.  LCx:  No obstructive disease.  RCA:  No obstructive disease.  LVEF=65%  Renal Art [place] 9/13 Patent renal arteries; < 60% bilateral   Past Medical History  Diagnosis Date  . HYPERLIPIDEMIA 08/01/2006  . OBESITY 09/16/2006  . SICKLE CELL ANEMIA 08/01/2006  . ANXIETY 09/16/2006  . DEPRESSION 09/16/2006  . HYPERTENSION 08/01/2006  . 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 s/p septal myomectomy 4/12 at [place] with Dr. Evelina Dun;  b. echo 5/12: EF 60-65%, LVOT peak 18 mmHg; grade 1 diast dysfxn, mild SAM (improved since myomectomy;  c. 03/2012 Echo: EF 60-65%, mod-sev basal septal asymm hypertrophy, basal septal HK, LVOT grad 98mHg, Gr 1 DD, , SAM, Mild MR, nl RV, PASP 339mg; 08/2012 Echo: technically difficult, doubt LVOT obstruction, EF 60%, Gr 1 DD, mod-sev dil LA.  . Marland KitchenSTHMA 08/01/2006  . VENTRAL HERNIA 12/07/2006  . ANGIOEDEMA 03/07/2008    a. with ACE-I  . DIVERTICULOSIS, COLON, WITH HEMORRHAGE 04/06/2010  . CAD (coronary artery disease)     a. 01/2010 : Minimal plaque at cardiac catheterization - CFX 20%, EF 70%;  b. 08/2012 Cath: Nl Cors, EF 65%.  . Marland KitchenBBB (left bundle branch block) 06/17/2010  . Chronic diastolic CHF (congestive heart  failure) 04/06/2010    a. In setting of HOCM.  Marland Kitchen Impaired glucose tolerance 06/25/2010  . DM (diabetes mellitus) in pregnancy, delivered w/postpartum condition 08/30/2010  . Type II or unspecified type diabetes mellitus without mention of complication, uncontrolled 08/30/2010  . COPD (chronic obstructive pulmonary disease)   . Renal artery aneurysm   . Complete heart block 02/26/2014    a. s/p MDT dual chamber pacemaker 02/2014    Past Surgical History  Procedure Laterality Date  . Abdominal hysterectomy  1987  . Ventral hernia repair  06/2006  . Myomectomy       Septal  . Left heart cath  Aug. 18, 2014    Medtronic heart device  . Cardiac surgery    . Left heart catheterization with coronary angiogram N/A 09/10/2012    Procedure: LEFT HEART CATHETERIZATION WITH CORONARY ANGIOGRAM;  Surgeon: Burnell Blanks, MD;  Location: [facility] CATH LAB;  Service: Cardiovascular;  Laterality: N/A;  . Electrophysiology study N/A 09/12/2012    Procedure: ELECTROPHYSIOLOGY STUDY;  Surgeon: Deboraha Sprang, MD;  Location: [facility] CATH LAB;  Service: Cardiovascular;  Laterality: N/A;  . Permanent pacemaker insertion N/A 02/26/2014    MDT Advisa dual chamber pacemaker implanted by Dr Caryl Comes for heart block     Current Outpatient Prescriptions  Medication Sig Dispense Refill  . albuterol (PROVENTIL HFA;VENTOLIN HFA) 108 (90 BASE) MCG/ACT inhaler Inhale 2 puffs into the lungs every 6 (six) hours as needed for wheezing or shortness of breath. (Patient taking differently: Inhale 1-2 puffs into the lungs every 6 (six) hours as needed for wheezing or shortness of breath. ) 18 g 2  . amLODipine (NORVASC) 10 MG tablet Take 1 tablet (10 mg total) by mouth daily. 90 tablet 3  . atorvastatin (LIPITOR) 10 MG tablet Take 10 mg by mouth daily.    . cetirizine (ZYRTEC) 10 MG tablet Take 10 mg by mouth daily.      . fenofibrate (TRICOR) 145 MG tablet Take 1 tablet (145 mg total) by mouth daily. 90 tablet 3  . fluticasone (FLONASE) 50 MCG/ACT nasal spray Place 2 sprays into the nose daily. (Patient taking differently: Place 1 spray into the nose daily as needed for allergies. ) 16 g 6  . irbesartan (AVAPRO) 300 MG tablet Take 300 mg by mouth at bedtime.    . metoprolol succinate (TOPROL-XL) 100 MG 24 hr tablet Take 100 mg by mouth daily. Take with or immediately following a meal.    . oxcarbazepine (TRILEPTAL) 600 MG tablet Take 600 mg by mouth 2 (two) times daily.    . pantoprazole (PROTONIX) 40 MG tablet Take 1 tablet (40 mg total) by mouth daily. 30 tablet 0  . potassium chloride (K-DUR) 10  MEQ tablet Take 1 tablet (10 mEq total) by mouth 2 (two) times daily. 90 tablet 11  . sucralfate (CARAFATE) 1 GM/10ML suspension Take 10 mLs (1 g total) by mouth 4 (four) times daily -  with meals and at bedtime. 420 mL 0  . topiramate (TOPAMAX) 100 MG tablet Take 100 mg by mouth daily.   0   No current facility-administered medications for this visit.    Allergies:   Dairy aid; Ace inhibitors; Aspirin; Codeine; and Soy allergy    Social History:  The patient  reports that she quit smoking about 16 years ago. Her smoking use included Cigarettes. She has a 5 pack-year smoking history. She has never used smokeless tobacco. She reports that she does not drink alcohol or use  illicit drugs.   Family History:  The patient's family history includes Cancer in her daughter; Clotting disorder in her daughter and other; Colon cancer in her daughter; Diabetes in her paternal grandmother; Heart attack in her father and sister; Heart disease in her daughter and father; Hyperlipidemia in her brother, daughter, and sister; Hypertension in her brother, father, sister, and son; Stroke in her sister.    ROS:   Please see the history of present illness.   Review of Systems  Constitution: Negative for chills and fever.  HENT: Positive for headaches. Negative for congestion.        Dull bilateral temporal HA  Eyes: Negative for vision loss in left eye and vision loss in right eye.  Cardiovascular: Positive for near-syncope. Negative for syncope.  Respiratory: Negative for cough.   Hematologic/Lymphatic: Negative for bleeding problem.  Skin: Negative for rash.  Gastrointestinal: Negative for abdominal pain, diarrhea, hematochezia, melena and vomiting.  Genitourinary: Negative for dysuria and hematuria.  Neurological: Positive for dizziness.  All other systems reviewed and are negative.    PHYSICAL EXAM: VS:  BP 122/68 mmHg  Pulse 65  Ht '5\' 9"'$  (1.753 m)  Wt 175 lb (79.379 kg)  BMI 25.83 kg/m2     Orthostatic VS for the past 24 hrs:  BP- Lying Pulse- Lying BP- Sitting Pulse- Sitting BP- Standing at 0 minutes Pulse- Standing at 0 minutes  04/08/14 1114 122/68 mmHg 65 137/77 mmHg 64 131/78 mmHg 66    Wt Readings from Last 3 Encounters:  04/08/14 175 lb (79.379 kg)  02/26/14 171 lb 8.3 oz (77.8 kg)  01/28/14 174 lb 12 oz (79.266 kg)     GEN: Well nourished, well developed, in no acute distress HEENT: normal Neck: no JVD, no masses Cardiac:  Normal 99991111, RRR; 2/6 systolic murmur along LSB, no rubs or gallops, no edema  Respiratory:  clear to auscultation bilaterally, no wheezing, rhonchi or rales. GI: soft, nontender, nondistended, + BS MS: no deformity or atrophy Skin: warm and dry  Neuro:  CNs II-XII intact, Strength and sensation are intact Psych: Normal affect   EKG:  EKG is ordered today.  It demonstrates:   NSR, HR 66, LBBB, no change from prior tracing.   Pacer Interrogation: "Pacemaker check in clinic for dizziness. Pt seeing [employer] as addon. Normal device function. Thresholds, sensing, impedances consistent with previous measurements. Device programmed to maximize longevity. No mode switch or high ventricular rates noted. Device programmed at appropriate safety margins. Histogram distribution appropriate for patient activity level. Device programmed to optimize intrinsic conduction. Estimated longevity 13 years. Patient enrolled in remote follow-up. Follow up as planned."   Recent Labs: 10/21/2013: Pro B Natriuretic peptide (BNP) 289.7* 01/28/2014: ALT 18; TSH 0.51 02/21/2014: BUN 18; Creatinine 0.82; Hemoglobin 13.3; Platelets 227.0; Potassium 4.1; Sodium 140    Lipid Panel    Component Value Date/Time   CHOL 196 01/28/2014 1510   TRIG * 01/28/2014 1510    675.0 Triglyceride is over 400; calculations on Lipids are invalid.   HDL 37.50* 01/28/2014 1510   CHOLHDL 5 01/28/2014 1510   VLDL 135.0* 01/28/2014 1510   LDLCALC 79 09/11/2012 0615   LDLDIRECT  79.5 01/28/2014 1510      ASSESSMENT AND PLAN:  Dizziness I believe her dizziness is likely related to a viral GI illness that just started.  She also had increased diaphoresis last week when her school's AC was out.  Orthostatic VS are unremarkable today.  However, I suspect she is  somewhat dehydrated.  This is likely exacerbated by her LVOT gradient.  Her pacer interrogation today did not demonstrate any high VR episodes.  It is functioning appropriately.  Her last echo was in 09/2013 with fairly stable gradients.  I reviewed her case today with Dr. Candee Furbish (DOD).  We do not think she needs a repeat Echo.    -  Hydrate with fluids (H2O, gatorade, etc.)  Rest.  FU with PCP in next week.    -  Labs today:  BMET, Mg2+, CBC, LFTs, UA  Complete heart block s/p Pacemaker-Medtronic  MRI compatible Pacer interrogated today.  If is functioning appropriately.  Hypertrophic obstructive cardiomyopathy S/P myomectomy Recent echo with stable gradients. Stay hydrated with recent illness as noted.  Essential hypertension Controlled.   Chronic diastolic heart failure No evidence of volume excess.  She is not on diuretics.    Current medicines are reviewed at length with the patient today.  The patient does not have concerns regarding medicines.  The following changes have been made:  As above.    Labs/ tests ordered today include:  Orders Placed This Encounter  Procedures  . Basic metabolic panel  . Magnesium  . CBC w/Diff  . Hepatic function panel  . Urinalysis, Routine w reflex microscopic  . EKG 12-Lead    Disposition:   FU with Dr. [name] as planned.  Patient to schedule FU with PCP in next week.    Signed, Versie Starks, MHS 04/08/2014 11:40 AM    [facility] [place], [place],   [address] Phone: [phone]; Fax: [phone]

## 2014-04-08 NOTE — Patient Instructions (Signed)
Your physician recommends that you continue on your current medications as directed. Please refer to the Current Medication list given to you today.  Lab Today: Bmet, Mag, Cbc, Lft, UA  Your physician recommends that you schedule a follow-up appointment next week with your primary care physician  Follow up as planned with Dr.Klein in May 2016

## 2014-04-08 NOTE — Progress Notes (Signed)
Pacemaker check in clinic for dizziness. Pt seeing [facility] Weaver as addon. Normal device function. Thresholds, sensing, impedances consistent with previous measurements. Device programmed to maximize longevity. No mode switch or high ventricular rates noted. Device programmed at appropriate safety margins. Histogram distribution appropriate for patient activity level. Device programmed to optimize intrinsic conduction. Estimated longevity 13 years. Patient enrolled in remote follow-up. Follow up as planned.

## 2014-04-08 NOTE — Telephone Encounter (Signed)
New message       Pt c/o Syncope: STAT if syncope occurred within 30 minutes and pt complains of lightheadedness High Priority if episode of passing out, completely, today or in last 24 hours   Did you pass out today? No but felt like it When is the last time you passed out?  Have not passed out 1. Has this occurred multiple times? no  2. Did you have any symptoms prior to passing out? A little lightheaded.   3. Pt got a pacemaker in feb.  She is a little sob now but may be due to being nervous

## 2014-04-09 ENCOUNTER — Ambulatory Visit (INDEPENDENT_AMBULATORY_CARE_PROVIDER_SITE_OTHER): Payer: BC Managed Care – PPO | Admitting: Internal Medicine

## 2014-04-09 VITALS — BP 132/88 | HR 64 | Temp 98.9°F | Resp 18 | Ht 69.0 in | Wt 175.0 lb

## 2014-04-09 DIAGNOSIS — I10 Essential (primary) hypertension: Secondary | ICD-10-CM

## 2014-04-09 DIAGNOSIS — J069 Acute upper respiratory infection, unspecified: Secondary | ICD-10-CM | POA: Insufficient documentation

## 2014-04-09 DIAGNOSIS — R42 Dizziness and giddiness: Secondary | ICD-10-CM

## 2014-04-09 MED ORDER — MECLIZINE HCL 12.5 MG PO TABS
12.5000 mg | ORAL_TABLET | Freq: Three times a day (TID) | ORAL | Status: DC | PRN
Start: 2014-04-09 — End: 2015-06-08

## 2014-04-09 MED ORDER — AZITHROMYCIN 250 MG PO TABS: ORAL_TABLET | ORAL | Status: DC

## 2014-04-09 NOTE — Progress Notes (Signed)
Pre visit review using our clinic review tool, if applicable. No additional management support is needed unless otherwise documented below in the visit note. 

## 2014-04-09 NOTE — Patient Instructions (Signed)
Please take all new medication as prescribed - the antibiotic, meclizine for dizziness if needed, and mucinex OTC  Please continue all other medications as before, and refills have been done if requested.  Please have the pharmacy call with any other refills you may need.  Please keep your appointments with your specialists as you may have planned

## 2014-04-09 NOTE — Progress Notes (Signed)
Subjective:    Patient ID: Stacey [name], female    DOB: Nov 09, 1950, 64 y.o.   MRN: [id number]  HPI  Here with 2-3 days acute onset fever, left earache,facial pain, pressure, headache, general weakness and malaise, with mild ST and non prod cough, assoc with positional vertigo for several days,  but pt denies chest pain, wheezing, increased sob or doe, orthopnea, PND, increased LE swelling, palpitations, dizziness or syncope. .Pt denies new neurological symptoms such as new headache, or facial or extremity weakness or numbness Past Medical History  Diagnosis Date  . HYPERLIPIDEMIA 08/01/2006  . OBESITY 09/16/2006  . SICKLE CELL ANEMIA 08/01/2006  . ANXIETY 09/16/2006  . DEPRESSION 09/16/2006  . HYPERTENSION 08/01/2006  . 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 s/p septal myomectomy 4/12 at [place] with Dr. Evelina Dun;  b. echo 5/12: EF 60-65%, LVOT peak 18 mmHg; grade 1 diast dysfxn, mild SAM (improved since myomectomy;  c. 03/2012 Echo: EF 60-65%, mod-sev basal septal asymm hypertrophy, basal septal HK, LVOT grad 79mHg, Gr 1 DD, , SAM, Mild MR, nl RV, PASP 339mg; 08/2012 Echo: technically difficult, doubt LVOT obstruction, EF 60%, Gr 1 DD, mod-sev dil LA.  . Marland KitchenSTHMA 08/01/2006  . VENTRAL HERNIA 12/07/2006  . ANGIOEDEMA 03/07/2008    a. with ACE-I  . DIVERTICULOSIS, COLON, WITH HEMORRHAGE 04/06/2010  . CAD (coronary artery disease)     a. 01/2010 : Minimal plaque at cardiac catheterization - CFX 20%, EF 70%;  b. 08/2012 Cath: Nl Cors, EF 65%.  . Marland KitchenBBB (left bundle branch block) 06/17/2010  . Chronic diastolic CHF (congestive heart failure) 04/06/2010    a. In setting of HOCM.  . Marland Kitchenmpaired glucose tolerance 06/25/2010  . DM (diabetes mellitus) in pregnancy, delivered w/postpartum condition 08/30/2010  . Type II or unspecified type diabetes mellitus without mention of complication, uncontrolled 08/30/2010  . COPD (chronic obstructive pulmonary disease)   . Renal artery aneurysm   . Complete  heart block 02/26/2014    a. s/p MDT dual chamber pacemaker 02/2014   Past Surgical History  Procedure Laterality Date  . Abdominal hysterectomy  1987  . Ventral hernia repair  06/2006  . Myomectomy      Septal  . Left heart cath  Aug. 18, 2014    Medtronic heart device  . Cardiac surgery    . Left heart catheterization with coronary angiogram N/A 09/10/2012    Procedure: LEFT HEART CATHETERIZATION WITH CORONARY ANGIOGRAM;  Surgeon: ChBurnell BlanksMD;  [facility];  Service: Cardiovascular;  Laterality: N/A;  . Electrophysiology study N/A 09/12/2012    Procedure: ELECTROPHYSIOLOGY STUDY;  Surgeon: StDeboraha SprangMD;  [facility];  Service: Cardiovascular;  Laterality: N/A;  . Permanent pacemaker insertion N/A 02/26/2014    MDT Advisa dual chamber pacemaker implanted by Dr KlCaryl Comesor heart block    reports that she quit smoking about 16 years ago. Her smoking use included Cigarettes. She has a 5 pack-year smoking history. She has never used smokeless tobacco. She reports that she does not drink alcohol or use illicit drugs. family history includes Cancer in her daughter; Clotting disorder in her daughter and other; Colon cancer in her daughter; Diabetes in her paternal grandmother; Heart attack in her father and sister; Heart disease in her daughter and father; Hyperlipidemia in her brother, daughter, and sister; Hypertension in her brother, father, sister, and son; Stroke in her sister. Allergies  Allergen Reactions  . Dairy Aid [Lactase] Diarrhea and Other (  See Comments)    Bloating and gastric distress  . Ace Inhibitors Other (See Comments)    REACTION: angioedema right eye  . Aspirin Other (See Comments)    Bleeding   . Codeine Other (See Comments)    Hallucinations, can take if with someone.  . Soy Allergy Other (See Comments)    Bleeding & cramps   Current Outpatient Prescriptions on File Prior to Visit  Medication Sig Dispense Refill  . albuterol  (PROVENTIL HFA;VENTOLIN HFA) 108 (90 BASE) MCG/ACT inhaler Inhale 2 puffs into the lungs every 6 (six) hours as needed for wheezing or shortness of breath. (Patient taking differently: Inhale 1-2 puffs into the lungs every 6 (six) hours as needed for wheezing or shortness of breath. ) 18 g 2  . amLODipine (NORVASC) 10 MG tablet Take 1 tablet (10 mg total) by mouth daily. 90 tablet 3  . atorvastatin (LIPITOR) 10 MG tablet Take 10 mg by mouth daily.    . cetirizine (ZYRTEC) 10 MG tablet Take 10 mg by mouth daily.      . fenofibrate (TRICOR) 145 MG tablet Take 1 tablet (145 mg total) by mouth daily. 90 tablet 3  . fluticasone (FLONASE) 50 MCG/ACT nasal spray Place 2 sprays into the nose daily. (Patient taking differently: Place 1 spray into the nose daily as needed for allergies. ) 16 g 6  . irbesartan (AVAPRO) 300 MG tablet Take 300 mg by mouth at bedtime.    . metoprolol succinate (TOPROL-XL) 100 MG 24 hr tablet Take 100 mg by mouth daily. Take with or immediately following a meal.    . oxcarbazepine (TRILEPTAL) 600 MG tablet Take 600 mg by mouth 2 (two) times daily.    . pantoprazole (PROTONIX) 40 MG tablet Take 1 tablet (40 mg total) by mouth daily. 30 tablet 0  . potassium chloride (K-DUR) 10 MEQ tablet Take 1 tablet (10 mEq total) by mouth 2 (two) times daily. 90 tablet 11  . sucralfate (CARAFATE) 1 GM/10ML suspension Take 10 mLs (1 g total) by mouth 4 (four) times daily -  with meals and at bedtime. 420 mL 0  . topiramate (TOPAMAX) 100 MG tablet Take 100 mg by mouth daily.   0   No current facility-administered medications on file prior to visit.   Review of Systems  Constitutional: Negative for unusual diaphoresis or night sweats HENT: Negative for ringing in ear or discharge Eyes: Negative for double vision or worsening visual disturbance.  Respiratory: Negative for choking and stridor.   Gastrointestinal: Negative for vomiting or other signifcant bowel change Genitourinary: Negative for  hematuria or change in urine volume.  Musculoskeletal: Negative for other MSK pain or swelling Skin: Negative for color change and worsening wound.  Neurological: Negative for tremors and numbness other than noted  Psychiatric/Behavioral: Negative for decreased concentration or agitation other than above       Objective:   Physical Exam BP 132/88 mmHg  Pulse 64  Temp(Src) 98.9 F (37.2 C) (Oral)  Resp 18  Ht 5' 9"$  (1.753 m)  Wt 175 lb 0.6 oz (79.398 kg)  BMI 25.84 kg/m2  SpO2 93% VS noted, mild ill Constitutional: Pt appears in no significant distress HENT: Head: NCAT.  Right Ear: External ear normal.  Left Ear: External ear normal.  Eyes: . Pupils are equal, round, and reactive to light. Conjunctivae and EOM are normal Left tm's with severe erythema.  Max sinus areas mild tender.  Pharynx with mild erythema, no exudate Neck: Normal range  of motion. Neck supple.  Cardiovascular: Normal rate and regular rhythm.   Pulmonary/Chest: Effort normal and breath sounds without rales or wheezing.  Neurological: Pt is alert. Not confused , motor grossly intact Skin: Skin is warm. No rash, no LE edema Psychiatric: Pt behavior is normal. No agitation.     Assessment & Plan:

## 2014-04-10 NOTE — Assessment & Plan Note (Signed)
Mild to mod, for antibx course,  to f/u any worsening symptoms or concerns 

## 2014-04-10 NOTE — Assessment & Plan Note (Signed)
stable overall by history and exam, recent data reviewed with pt, and pt to continue medical treatment as before,  to f/u any worsening symptoms or concerns BP Readings from Last 3 Encounters:  04/09/14 132/88  04/08/14 122/68  02/27/14 156/72

## 2014-04-10 NOTE — Assessment & Plan Note (Signed)
Ok for meclizine prn,  to f/u any worsening symptoms or concerns 

## 2014-04-11 ENCOUNTER — Encounter: Payer: Self-pay | Admitting: Cardiology

## 2014-04-11 LAB — MDC_IDC_ENUM_SESS_TYPE_REMOTE
Battery Voltage: 3.1 V
Brady Statistic AP VP Percent: 0.01 %
Brady Statistic AP VS Percent: 19.04 %
Brady Statistic AS VP Percent: 0.03 %
Brady Statistic AS VS Percent: 80.92 %
Brady Statistic RA Percent Paced: 19.05 %
Brady Statistic RV Percent Paced: 0.04 %
Date Time Interrogation Session: 20160216223221
Lead Channel Impedance Value: 323 Ohm
Lead Channel Impedance Value: 456 Ohm
Lead Channel Impedance Value: 456 Ohm
Lead Channel Impedance Value: 532 Ohm
Lead Channel Pacing Threshold Amplitude: 0.625 V
Lead Channel Pacing Threshold Amplitude: 0.75 V
Lead Channel Pacing Threshold Pulse Width: 0.4 ms
Lead Channel Pacing Threshold Pulse Width: 0.4 ms
Lead Channel Sensing Intrinsic Amplitude: 18.625 mV
Lead Channel Sensing Intrinsic Amplitude: 18.625 mV
Lead Channel Sensing Intrinsic Amplitude: 2.25 mV
Lead Channel Sensing Intrinsic Amplitude: 2.25 mV
Lead Channel Setting Pacing Amplitude: 3.5 V
Lead Channel Setting Pacing Amplitude: 3.5 V
Lead Channel Setting Pacing Pulse Width: 0.4 ms
Lead Channel Setting Sensing Sensitivity: 2.8 mV
Zone Setting Detection Interval: 350 ms
Zone Setting Detection Interval: 400 ms

## 2014-04-15 ENCOUNTER — Encounter: Payer: Self-pay | Admitting: Internal Medicine

## 2014-04-17 ENCOUNTER — Other Ambulatory Visit: Payer: Self-pay | Admitting: Internal Medicine

## 2014-04-18 ENCOUNTER — Encounter: Payer: Self-pay | Admitting: Cardiology

## 2014-04-24 ENCOUNTER — Encounter: Payer: Self-pay | Admitting: Internal Medicine

## 2014-04-25 ENCOUNTER — Encounter: Payer: Self-pay | Admitting: Cardiology

## 2014-04-28 ENCOUNTER — Encounter: Payer: Self-pay | Admitting: Internal Medicine

## 2014-05-02 ENCOUNTER — Encounter: Payer: Self-pay | Admitting: Cardiology

## 2014-05-13 ENCOUNTER — Ambulatory Visit (INDEPENDENT_AMBULATORY_CARE_PROVIDER_SITE_OTHER): Payer: BC Managed Care – PPO | Admitting: Internal Medicine

## 2014-05-13 ENCOUNTER — Encounter: Payer: Self-pay | Admitting: Internal Medicine

## 2014-05-13 ENCOUNTER — Other Ambulatory Visit (INDEPENDENT_AMBULATORY_CARE_PROVIDER_SITE_OTHER): Payer: BC Managed Care – PPO

## 2014-05-13 VITALS — BP 142/86 | HR 74 | Temp 98.5°F | Resp 18 | Ht 69.0 in | Wt 175.0 lb

## 2014-05-13 DIAGNOSIS — R1084 Generalized abdominal pain: Secondary | ICD-10-CM

## 2014-05-13 LAB — BASIC METABOLIC PANEL
BUN: 14 mg/dL (ref 6–23)
CO2: 25 mEq/L (ref 19–32)
Calcium: 9.6 mg/dL (ref 8.4–10.5)
Chloride: 107 mEq/L (ref 96–112)
Creatinine, Ser: 0.82 mg/dL (ref 0.40–1.20)
GFR: 90.61 mL/min (ref >60.00)
Glucose, Bld: 176 mg/dL — ABNORMAL HIGH (ref 70–99)
Potassium: 3.8 mEq/L (ref 3.5–5.1)
Sodium: 140 mEq/L (ref 135–145)

## 2014-05-13 LAB — CBC WITH DIFFERENTIAL/PLATELET
Basophils Absolute: 0 10*3/uL (ref 0.0–0.1)
Basophils Relative: 0.5 % (ref 0.0–3.0)
Eosinophils Absolute: 0.2 10*3/uL (ref 0.0–0.7)
Eosinophils Relative: 2.4 % (ref 0.0–5.0)
HCT: 38 % (ref 36.0–46.0)
Hemoglobin: 12.7 g/dL (ref 12.0–15.0)
Lymphocytes Relative: 23 % (ref 12.0–46.0)
Lymphs Abs: 1.4 10*3/uL (ref 0.7–4.0)
MCHC: 33.4 g/dL (ref 30.0–36.0)
MCV: 76.6 fl — ABNORMAL LOW (ref 78.0–100.0)
Monocytes Absolute: 0.4 10*3/uL (ref 0.1–1.0)
Monocytes Relative: 5.7 % (ref 3.0–12.0)
Neutro Abs: 4.2 10*3/uL (ref 1.4–7.7)
Neutrophils Relative %: 68.4 % (ref 43.0–77.0)
Platelets: 223 10*3/uL (ref 150.0–400.0)
RBC: 4.97 Mil/uL (ref 3.87–5.11)
RDW: 15.1 % (ref 11.5–15.5)
WBC: 6.2 10*3/uL (ref 4.0–10.5)

## 2014-05-13 LAB — URINALYSIS, ROUTINE W REFLEX MICROSCOPIC
Bilirubin Urine: NEGATIVE
Hgb urine dipstick: NEGATIVE
Ketones, ur: NEGATIVE
Leukocytes, UA: NEGATIVE
Nitrite: NEGATIVE
Specific Gravity, Urine: 1.02 (ref 1.000–1.030)
Total Protein, Urine: NEGATIVE
Urine Glucose: NEGATIVE
Urobilinogen, UA: 0.2 (ref 0.0–1.0)
pH: 5.5 (ref 5.0–8.0)

## 2014-05-13 LAB — HEPATIC FUNCTION PANEL
ALT: 20 U/L (ref 0–35)
AST: 16 U/L (ref 0–37)
Albumin: 4.3 g/dL (ref 3.5–5.2)
Alkaline Phosphatase: 135 U/L — ABNORMAL HIGH (ref 39–117)
Bilirubin, Direct: 0.1 mg/dL (ref 0.0–0.3)
Total Bilirubin: 0.4 mg/dL (ref 0.2–1.2)
Total Protein: 7.2 g/dL (ref 6.0–8.3)

## 2014-05-13 MED ORDER — METRONIDAZOLE 250 MG PO TABS: 250.0000 mg | ORAL_TABLET | Freq: Three times a day (TID) | ORAL | Status: DC

## 2014-05-13 NOTE — Patient Instructions (Signed)
Please take all new medication as prescribed - the antibiotic  Please continue all other medications as before, and refills have been done if requested.  Please have the pharmacy call with any other refills you may need.  Please continue your efforts at being more active, low cholesterol diet, and weight control.  Please keep your appointments with your specialists as you may have planned  Please go to the LAB in the Basement (turn left off the elevator) for the tests to be done today  You will be contacted by phone if any changes need to be made immediately.  Otherwise, you will receive a letter about your results with an explanation, but please check with MyChart first.  Please remember to sign up for MyChart if you have not done so, as this will be important to you in the future with finding out test results, communicating by private email, and scheduling acute appointments online when needed.   

## 2014-05-13 NOTE — Progress Notes (Signed)
Subjective:    Patient ID: Stacey [name], female    DOB: [birth year], 64 y.o.   MRN: [id number]  HPI  Here to fu, s/p PPM feb 2016.  C/o just over 1 wk onset diffuse (mostly mid abd) abd pain mod to severe, crampy, gassy, with bloating, wax and wanes to mild, never seems to go away, assoc with nausea, few vomiting, and diarrhea/loose stools and "ribbons" without blood.  Can feel some discomfort towards the back as well.  No blood in vomiting. No fever she is aware of, and none today. No wt change Wt Readings from Last 3 Encounters:  05/13/14 175 lb (79.379 kg)  04/09/14 175 lb 0.6 oz (79.398 kg)  04/08/14 175 lb (79.379 kg)  Has lower appetite as well. No sick contacts.  Today seems not as severe, but still some nausea and feels she will have diarrhea later today after some left sided discomfort starting this AM.  No prior hx of same. No hx of c diff. S/p tx for URI last month.  No recent travel. Cbc, bmp, lfet's ok from Mar 15, with elev mild BS only.  Denies urinary symptoms such as dysuria, frequency, urgency, flank pain, hematuria or fever, chills. Past Medical History  Diagnosis Date  . HYPERLIPIDEMIA 08/01/2006  . OBESITY 09/16/2006  . SICKLE CELL ANEMIA 08/01/2006  . ANXIETY 09/16/2006  . DEPRESSION 09/16/2006  . HYPERTENSION 08/01/2006  . 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 s/p septal myomectomy 4/12 at [facility] with Dr. Silvestre Mesi;  b. echo 5/12: EF 60-65%, LVOT peak 18 mmHg; grade 1 diast dysfxn, mild SAM (improved since myomectomy;  c. 03/2012 Echo: EF 60-65%, mod-sev basal septal asymm hypertrophy, basal septal HK, LVOT grad , Gr 1 DD, , SAM, Mild MR, nl RV, PASP ; 08/2012 Echo: technically difficult, doubt LVOT obstruction, EF 60%, Gr 1 DD, mod-sev dil LA.  Marland Kitchen ASTHMA 08/01/2006  . VENTRAL HERNIA 12/07/2006  . ANGIOEDEMA 03/07/2008    a. with ACE-I  . DIVERTICULOSIS, COLON, WITH HEMORRHAGE 04/06/2010  . CAD (coronary artery disease)     a. 01/2010 : Minimal  plaque at cardiac catheterization - CFX 20%, EF 70%;  b. 08/2012 Cath: Nl Cors, EF 65%.  Marland Kitchen LBBB (left bundle branch block) 06/17/2010  . Chronic diastolic CHF (congestive heart failure) 04/06/2010    a. In setting of HOCM.  Marland Kitchen Impaired glucose tolerance 06/25/2010  . DM (diabetes mellitus) in pregnancy, delivered w/postpartum condition 08/30/2010  . Type II or unspecified type diabetes mellitus without mention of complication, uncontrolled 08/30/2010  . COPD (chronic obstructive pulmonary disease)   . Renal artery aneurysm   . Complete heart block 02/26/2014    a. s/p MDT dual chamber pacemaker 02/2014   Past Surgical History  Procedure Laterality Date  . Abdominal hysterectomy  1987  . Ventral hernia repair  06/2006  . Myomectomy      Septal  . Left heart cath  Aug. 18, 2014    Medtronic heart device  . Cardiac surgery    . Left heart catheterization with coronary angiogram N/A 09/10/2012    Procedure: LEFT HEART CATHETERIZATION WITH CORONARY ANGIOGRAM;  Surgeon: Kathleene Hazel, MD;  Location[facility];  Service: Cardiovascular;  Laterality: N/A;  . Electrophysiology study N/A 09/12/2012    Procedure: ELECTROPHYSIOLOGY STUDY;  Surgeon: [name], MD;  Location: [facility] LAB;  Service: Cardiovascular;  Laterality: N/A;  . Permanent pacemaker insertion N/A 02/26/2014    MDT Advisa dual chamber  pacemaker implanted by Dr Graciela Husbands for heart block    reports that she quit smoking about 16 years ago. Her smoking use included Cigarettes. She has a 5 pack-year smoking history. She has never used smokeless tobacco. She reports that she does not drink alcohol or use illicit drugs. family history includes Cancer in her daughter; Clotting disorder in her daughter and other; Colon cancer in her daughter; Diabetes in her paternal grandmother; Heart attack in her father and sister; Heart disease in her daughter and father; Hyperlipidemia in her brother, daughter, and sister; Hypertension in her brother,  father, sister, and son; Stroke in her sister. Allergies  Allergen Reactions  . Dairy Aid [Lactase] Diarrhea and Other (See Comments)    Bloating and gastric distress  . Ace Inhibitors Other (See Comments)    REACTION: angioedema right eye  . Aspirin Other (See Comments)    Bleeding   . Codeine Other (See Comments)    Hallucinations, can take if with someone.  . Soy Allergy Other (See Comments)    Bleeding & cramps   Current Outpatient Prescriptions on File Prior to Visit  Medication Sig Dispense Refill  . albuterol (PROVENTIL HFA;VENTOLIN HFA) 108 (90 BASE) MCG/ACT inhaler Inhale 2 puffs into the lungs every 6 (six) hours as needed for wheezing or shortness of breath. (Patient taking differently: Inhale 1-2 puffs into the lungs every 6 (six) hours as needed for wheezing or shortness of breath. ) 18 g 2  . amLODipine (NORVASC) 10 MG tablet Take 1 tablet (10 mg total) by mouth daily. 90 tablet 3  . atorvastatin (LIPITOR) 10 MG tablet Take 10 mg by mouth daily.    Marland Kitchen azithromycin (ZITHROMAX Z-PAK) 250 MG tablet Use as directed 6 tablet 1  . cetirizine (ZYRTEC) 10 MG tablet Take 10 mg by mouth daily.      . fenofibrate (TRICOR) 145 MG tablet Take 1 tablet (145 mg total) by mouth daily. 90 tablet 3  . fluticasone (FLONASE) 50 MCG/ACT nasal spray Place 2 sprays into the nose daily. (Patient taking differently: Place 1 spray into the nose daily as needed for allergies. ) 16 g 6  . irbesartan (AVAPRO) 300 MG tablet Take 300 mg by mouth at bedtime.    . meclizine (ANTIVERT) 12.5 MG tablet Take 1 tablet (12.5 mg total) by mouth 3 (three) times daily as needed for dizziness. 40 tablet 1  . metoprolol succinate (TOPROL-XL) 100 MG 24 hr tablet Take 100 mg by mouth daily. Take with or immediately following a meal.    . metoprolol succinate (TOPROL-XL) 100 MG 24 hr tablet TAKE 1 TABLET BY MOUTH EVERY DAY 90 tablet 3  . oxcarbazepine (TRILEPTAL) 600 MG tablet Take 600 mg by mouth 2 (two) times daily.      . pantoprazole (PROTONIX) 40 MG tablet Take 1 tablet (40 mg total) by mouth daily. 30 tablet 0  . potassium chloride (K-DUR) 10 MEQ tablet Take 1 tablet (10 mEq total) by mouth 2 (two) times daily. 90 tablet 11  . sucralfate (CARAFATE) 1 GM/10ML suspension Take 10 mLs (1 g total) by mouth 4 (four) times daily -  with meals and at bedtime. 420 mL 0  . topiramate (TOPAMAX) 100 MG tablet Take 100 mg by mouth daily.   0   No current facility-administered medications on file prior to visit.    Review of Systems  Constitutional: Negative for unusual diaphoresis or night sweats HENT: Negative for ringing in ear or discharge Eyes: Negative for double  vision or worsening visual disturbance.  Respiratory: Negative for choking and stridor.   Gastrointestinal: Negative for vomiting or other signifcant bowel change Genitourinary: Negative for hematuria or change in urine volume.  Musculoskeletal: Negative for other MSK pain or swelling Skin: Negative for color change and worsening wound.  Neurological: Negative for tremors and numbness other than noted  Psychiatric/Behavioral: Negative for decreased concentration or agitation other than above       Objective:   Physical Exam BP 142/86 mmHg  Pulse 74  Temp(Src) 98.5 F (36.9 C) (Oral)  Resp 18  Ht 5\' 9"  (1.753 m)  Wt 175 lb (79.379 kg)  BMI 25.83 kg/m2  SpO2 97% VS noted, mild ill appaering Constitutional: Pt appears in no significant distress HENT: Head: NCAT.  Right Ear: External ear normal.  Left Ear: External ear normal.  Eyes: . Pupils are equal, round, and reactive to light. Conjunctivae and EOM are normal Neck: Normal range of motion. Neck supple.  Cardiovascular: Normal rate and regular rhythm.   Pulmonary/Chest: Effort normal and breath sounds without rales or wheezing.  Abd:  Soft,  ND, + BS with tender epigastrium,mid abd, LUQ and LLQ without guarding or rebound Neurological: Pt is alert. Not confused , motor grossly  intact Skin: Skin is warm. No rash, no LE edema Psychiatric: Pt behavior is normal. No agitation.      Assessment & Plan:

## 2014-05-13 NOTE — Assessment & Plan Note (Signed)
Mild overall, occ mod to severe pain, assoc n/v/d without blood, currently afeb with some abd tender, and recent labs mar 2016 cbc/cmet ok, in the setting of recent antibx; most c/w acute gastroenteritis - diff includes viral, bact/c diff; for c diff toxin study/GI pathogen panel/stool cx/labs and empiric flagyl given recent antibx,  to f/u any worsening symptoms or concerns

## 2014-05-14 ENCOUNTER — Other Ambulatory Visit: Payer: Self-pay | Admitting: Internal Medicine

## 2014-05-14 ENCOUNTER — Other Ambulatory Visit: Payer: BC Managed Care – PPO

## 2014-05-14 ENCOUNTER — Other Ambulatory Visit (INDEPENDENT_AMBULATORY_CARE_PROVIDER_SITE_OTHER): Payer: BC Managed Care – PPO

## 2014-05-14 DIAGNOSIS — R739 Hyperglycemia, unspecified: Secondary | ICD-10-CM

## 2014-05-14 DIAGNOSIS — R1084 Generalized abdominal pain: Secondary | ICD-10-CM

## 2014-05-14 LAB — HEMOGLOBIN A1C: Hgb A1c MFr Bld: 8.1 % — ABNORMAL HIGH (ref 4.6–6.5)

## 2014-05-15 ENCOUNTER — Telehealth: Payer: Self-pay | Admitting: Internal Medicine

## 2014-05-15 LAB — C. DIFFICILE GDH AND TOXIN A/B
C. difficile GDH: NOT DETECTED
C. difficile Toxin A/B: NOT DETECTED

## 2014-05-15 LAB — GASTROINTESTINAL PATHOGEN PANEL PCR
C. difficile Tox A/B, PCR: NEGATIVE
Campylobacter, PCR: NEGATIVE
Cryptosporidium, PCR: NEGATIVE
E coli (ETEC) LT/ST PCR: NEGATIVE
E coli (STEC) stx1/stx2, PCR: NEGATIVE
E coli 0157, PCR: NEGATIVE
Giardia lamblia, PCR: NEGATIVE
Norovirus, PCR: NEGATIVE
Rotavirus A, PCR: NEGATIVE
Salmonella, PCR: NEGATIVE
Shigella, PCR: NEGATIVE

## 2014-05-15 NOTE — Telephone Encounter (Signed)
Spoke w/ pt and she informed me that she has a PPM now. Informed pt to disregard letter.

## 2014-05-15 NOTE — Telephone Encounter (Signed)
New message     Talk to someone in device clinic regarding a letter she received

## 2014-05-16 ENCOUNTER — Other Ambulatory Visit: Payer: Self-pay | Admitting: Internal Medicine

## 2014-05-16 ENCOUNTER — Telehealth: Payer: Self-pay | Admitting: *Deleted

## 2014-05-16 MED ORDER — ONETOUCH ULTRA 2 W/DEVICE KIT: PACK | Status: DC

## 2014-05-16 MED ORDER — METFORMIN HCL ER 500 MG PO TB24: 500.0000 mg | ORAL_TABLET | Freq: Every day | ORAL | Status: DC

## 2014-05-16 MED ORDER — ONETOUCH LANCETS MISC: Status: DC

## 2014-05-16 MED ORDER — GLUCOSE BLOOD VI STRP: 1.0000 | ORAL_STRIP | Freq: Every day | Status: DC

## 2014-05-16 NOTE — Telephone Encounter (Signed)
Called pt concerning labs md has started her on metformin. Pt would like BS monitor to check BS. Inform pt will send over to pharmacy...[name]/lmb

## 2014-05-18 LAB — STOOL CULTURE

## 2014-05-19 MED ORDER — ONETOUCH LANCETS MISC: Status: DC

## 2014-05-19 MED ORDER — GLUCOSE BLOOD VI STRP: 1.0000 | ORAL_STRIP | Freq: Every day | Status: DC

## 2014-05-19 NOTE — Telephone Encounter (Signed)
Patient states that pharmacy told her they did not get the script for test strips or lancets but they got the machine. Please resubmit lancets and strips for the patient. Thanks to much

## 2014-05-19 NOTE — Addendum Note (Signed)
Addended by: Deatra JamesBRAND, Linder Prajapati M on: 05/19/2014 01:56 PM   Modules accepted: Orders, Medications

## 2014-05-19 NOTE — Telephone Encounter (Signed)
Resent strips & lancet to [facility]...[name]/lmb

## 2014-05-29 ENCOUNTER — Encounter: Payer: Self-pay | Admitting: Internal Medicine

## 2014-06-05 ENCOUNTER — Encounter: Payer: BC Managed Care – PPO | Admitting: Internal Medicine

## 2014-06-16 ENCOUNTER — Encounter: Payer: BC Managed Care – PPO | Admitting: Internal Medicine

## 2014-06-16 NOTE — Progress Notes (Signed)
Electrophysiology Office Note   Date:  06/16/2014   ID:  Stacey [name], DOB 1950/03/16, MRN [id number]  PCP:  Oliver Barre, MD  Cardiologist:   Primary Electrophysiologist: *   Sherryl Manges, MD    No chief complaint on file.    History of Present Illness: Stacey [name] is a 64 y.o. female is      Seen in followup for  palpitations and near syncope in the setting of hypertrophic cardiomyopathy with prior myectomy  She  underwent implantable loop recorder insertion and with recurrent event>> interrogation demonstrated intermittent complete heart block Palpitations>>NSR  She underwent pacemaker implantation  1/16  Today, she denies symptoms of palpitations, chest pain, shortness of breath, orthopnea, PND, lower extremity edema, claudication, dizziness, presyncope, syncope, bleeding, or neurologic sequela. The patient is tolerating medications without difficulties and is otherwise without complaint today.    Past Medical History  Diagnosis Date  . HYPERLIPIDEMIA 08/01/2006  . OBESITY 09/16/2006  . SICKLE CELL ANEMIA 08/01/2006  . ANXIETY 09/16/2006  . DEPRESSION 09/16/2006  . HYPERTENSION 08/01/2006  . 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 s/p septal myomectomy 4/12 at [facility] with Dr. Silvestre Mesi;  b. echo 5/12: EF 60-65%, LVOT peak 18 mmHg; grade 1 diast dysfxn, mild SAM (improved since myomectomy;  c. 03/2012 Echo: EF 60-65%, mod-sev basal septal asymm hypertrophy, basal septal HK, LVOT grad , Gr 1 DD, , SAM, Mild MR, nl RV, PASP ; 08/2012 Echo: technically difficult, doubt LVOT obstruction, EF 60%, Gr 1 DD, mod-sev dil LA.  Marland Kitchen ASTHMA 08/01/2006  . VENTRAL HERNIA 12/07/2006  . ANGIOEDEMA 03/07/2008    a. with ACE-I  . DIVERTICULOSIS, COLON, WITH HEMORRHAGE 04/06/2010  . CAD (coronary artery disease)     a. 01/2010 : Minimal plaque at cardiac catheterization - CFX 20%, EF 70%;  b. 08/2012 Cath: Nl Cors, EF 65%.  Marland Kitchen LBBB (left bundle branch block)  06/17/2010  . Chronic diastolic CHF (congestive heart failure) 04/06/2010    a. In setting of HOCM.  Marland Kitchen Impaired glucose tolerance 06/25/2010  . DM (diabetes mellitus) in pregnancy, delivered w/postpartum condition 08/30/2010  . Type II or unspecified type diabetes mellitus without mention of complication, uncontrolled 08/30/2010  . COPD (chronic obstructive pulmonary disease)   . Renal artery aneurysm   . Complete heart block 02/26/2014    a. s/p MDT dual chamber pacemaker 02/2014   Past Surgical History  Procedure Laterality Date  . Abdominal hysterectomy  1987  . Ventral hernia repair  06/2006  . Myomectomy      Septal  . Left heart cath  Aug. 18, 2014    Medtronic heart device  . Cardiac surgery    . Left heart catheterization with coronary angiogram N/A 09/10/2012    Procedure: LEFT HEART CATHETERIZATION WITH CORONARY ANGIOGRAM;  Surgeon: Kathleene Hazel, MD;  Location: [facility] CATH LAB;  Service: Cardiovascular;  Laterality: N/A;  . Electrophysiology study N/A 09/12/2012    Procedure: ELECTROPHYSIOLOGY STUDY;  Surgeon: [name], MD;  Location: [facility] CATH LAB;  Service: Cardiovascular;  Laterality: N/A;  . Permanent pacemaker insertion N/A 02/26/2014    MDT Advisa dual chamber pacemaker implanted by Dr Graciela Husbands for heart block     Current Outpatient Prescriptions  Medication Sig Dispense Refill  . albuterol (PROVENTIL HFA;VENTOLIN HFA) 108 (90 BASE) MCG/ACT inhaler Inhale 2 puffs into the lungs every 6 (six) hours as needed for wheezing or shortness of breath. (Patient taking differently: Inhale 1-2 puffs into the lungs  every 6 (six) hours as needed for wheezing or shortness of breath. ) 18 g 2  . amLODipine (NORVASC) 10 MG tablet Take 1 tablet (10 mg total) by mouth daily. 90 tablet 3  . atorvastatin (LIPITOR) 10 MG tablet Take 10 mg by mouth daily.    Marland Kitchen azithromycin (ZITHROMAX Z-PAK) 250 MG tablet Use as directed 6 tablet 1  . Blood Glucose Monitoring Suppl (ONE TOUCH ULTRA 2) W/DEVICE KIT  Use to check blood sugars daily Dx E11.9 1 each 0  . cetirizine (ZYRTEC) 10 MG tablet Take 10 mg by mouth daily.      . fenofibrate (TRICOR) 145 MG tablet Take 1 tablet (145 mg total) by mouth daily. 90 tablet 3  . fluticasone (FLONASE) 50 MCG/ACT nasal spray Place 2 sprays into the nose daily. (Patient taking differently: Place 1 spray into the nose daily as needed for allergies. ) 16 g 6  . glucose blood test strip 1 each by Other route daily. Use to check blood sugar daily Dx e11.9 100 each 11  . irbesartan (AVAPRO) 300 MG tablet Take 300 mg by mouth at bedtime.    . meclizine (ANTIVERT) 12.5 MG tablet Take 1 tablet (12.5 mg total) by mouth 3 (three) times daily as needed for dizziness. 40 tablet 1  . metFORMIN (GLUCOPHAGE-XR) 500 MG 24 hr tablet Take 1 tablet (500 mg total) by mouth daily with breakfast. 90 tablet 3  . metoprolol succinate (TOPROL-XL) 100 MG 24 hr tablet Take 100 mg by mouth daily. Take with or immediately following a meal.    . metoprolol succinate (TOPROL-XL) 100 MG 24 hr tablet TAKE 1 TABLET BY MOUTH EVERY DAY 90 tablet 3  . metroNIDAZOLE (FLAGYL) 250 MG tablet Take 1 tablet (250 mg total) by mouth 3 (three) times daily. 30 tablet 0  . ONE TOUCH LANCETS MISC Use to help check blood sugar daily Dx E11.9 200 each 0  . oxcarbazepine (TRILEPTAL) 600 MG tablet Take 600 mg by mouth 2 (two) times daily.    . pantoprazole (PROTONIX) 40 MG tablet Take 1 tablet (40 mg total) by mouth daily. 30 tablet 0  . potassium chloride (K-DUR) 10 MEQ tablet Take 1 tablet (10 mEq total) by mouth 2 (two) times daily. 90 tablet 11  . sucralfate (CARAFATE) 1 GM/10ML suspension Take 10 mLs (1 g total) by mouth 4 (four) times daily -  with meals and at bedtime. 420 mL 0  . topiramate (TOPAMAX) 100 MG tablet Take 100 mg by mouth daily.   0   No current facility-administered medications for this visit.    Allergies:   Dairy aid; Ace inhibitors; Aspirin; Codeine; and Soy allergy   Social History:   The patient  reports that she quit smoking about 16 years ago. Her smoking use included Cigarettes. She has a 5 pack-year smoking history. She has never used smokeless tobacco. She reports that she does not drink alcohol or use illicit drugs.   Family History:  The patient's family history includes Cancer in her daughter; Clotting disorder in her daughter and other; Colon cancer in her daughter; Diabetes in her paternal grandmother; Heart attack in her father and sister; Heart disease in her daughter and father; Hyperlipidemia in her brother, daughter, and sister; Hypertension in her brother, father, sister, and son; Stroke in her sister.    ROS:  Please see the history of present illness and past medical history  Otherwise, all other systems were reviewed and were negative except .  PHYSICAL EXAM: VS:  There were no vitals taken for this visit. , BMI There is no weight on file to calculate BMI. GEN: Well nourished, well developed, in no acute distress  HEENT: normal  Neck:  JVD flat, carotid bruits, or masses Cardiac: IRREGULAR RATE and RHYTHM ; /6 No murmurs, rubs, +No S4  Back without kyphosis; No CVAT Respiratory:  clear to auscultation bilaterally, normal work of breathing GI: soft, nontender, nondistended, + BS MS: no deformity or atrophy  Extremities no clubbing cyanosis +  edema Skin: warm and dry,  device pocket is well healed without teathering Neuro:  Strength and sensation are intact Psych: euthymic mood, full affect  EKG:  EKG  ordered today. The ekg ordered today shows   Device interrogation is reviewed today in detail.  See PaceArt for details.  Recent Labs: 10/21/2013: Pro B Natriuretic peptide (BNP) 289.7* 01/28/2014: TSH 0.51 04/08/2014: Magnesium 2.2 05/13/2014: ALT 20; BUN 14; Creatinine 0.82; Hemoglobin 12.7; Platelets 223.0; Potassium 3.8; Sodium 140    Lipid Panel     Component Value Date/Time   CHOL 196 01/28/2014 1510   TRIG * 01/28/2014 1510    675.0  Triglyceride is over 400; calculations on Lipids are invalid.   HDL 37.50* 01/28/2014 1510   CHOLHDL 5 01/28/2014 1510   VLDL 135.0* 01/28/2014 1510   LDLCALC 79 09/11/2012 0615   LDLDIRECT 79.5 01/28/2014 1510     Wt Readings from Last 3 Encounters:  05/13/14 175 lb (79.379 kg)  04/09/14 175 lb 0.6 oz (79.398 kg)  04/08/14 175 lb (79.379 kg)      Other studies Reviewed: Additional studies/ records that were reviewed today include: none   Demonstrating :    ASSESSMENT AND PLAN:      Current medicines are reviewed at length with the patient today.   The patient  concerns regarding her medicines.  The following changes were made today:    Labs/ tests ordered today include:  No orders of the defined types were placed in this encounter.     Disposition:   FU with me  device clinic    Signed, [name], MD  06/16/2014 10:29 AM     [place] [address] [phone] (office) [phone] (fax)

## 2014-06-27 ENCOUNTER — Encounter: Payer: Self-pay | Admitting: *Deleted

## 2014-07-10 LAB — HM DIABETES EYE EXAM

## 2014-07-15 ENCOUNTER — Other Ambulatory Visit: Payer: Self-pay | Admitting: Internal Medicine

## 2014-07-24 ENCOUNTER — Encounter: Payer: Self-pay | Admitting: Internal Medicine

## 2014-07-25 ENCOUNTER — Encounter: Payer: Self-pay | Admitting: *Deleted

## 2014-07-30 ENCOUNTER — Encounter: Payer: Self-pay | Admitting: Internal Medicine

## 2014-07-30 ENCOUNTER — Other Ambulatory Visit (INDEPENDENT_AMBULATORY_CARE_PROVIDER_SITE_OTHER): Payer: BC Managed Care – PPO

## 2014-07-30 ENCOUNTER — Ambulatory Visit (INDEPENDENT_AMBULATORY_CARE_PROVIDER_SITE_OTHER): Payer: BC Managed Care – PPO | Admitting: Internal Medicine

## 2014-07-30 VITALS — BP 120/76 | HR 80 | Temp 98.1°F | Ht 69.0 in | Wt 171.0 lb

## 2014-07-30 DIAGNOSIS — E119 Type 2 diabetes mellitus without complications: Secondary | ICD-10-CM

## 2014-07-30 DIAGNOSIS — R7989 Other specified abnormal findings of blood chemistry: Secondary | ICD-10-CM | POA: Diagnosis not present

## 2014-07-30 DIAGNOSIS — Z0000 Encounter for general adult medical examination without abnormal findings: Secondary | ICD-10-CM

## 2014-07-30 DIAGNOSIS — I10 Essential (primary) hypertension: Secondary | ICD-10-CM | POA: Diagnosis not present

## 2014-07-30 DIAGNOSIS — E785 Hyperlipidemia, unspecified: Secondary | ICD-10-CM

## 2014-07-30 DIAGNOSIS — Z0189 Encounter for other specified special examinations: Secondary | ICD-10-CM | POA: Diagnosis not present

## 2014-07-30 LAB — CBC WITH DIFFERENTIAL/PLATELET
Basophils Absolute: 0 10*3/uL (ref 0.0–0.1)
Basophils Relative: 0.3 % (ref 0.0–3.0)
Eosinophils Absolute: 0.1 10*3/uL (ref 0.0–0.7)
Eosinophils Relative: 2.1 % (ref 0.0–5.0)
HCT: 38.5 % (ref 36.0–46.0)
Hemoglobin: 12.6 g/dL (ref 12.0–15.0)
Lymphocytes Relative: 27.2 % (ref 12.0–46.0)
Lymphs Abs: 1.4 10*3/uL (ref 0.7–4.0)
MCHC: 32.8 g/dL (ref 30.0–36.0)
MCV: 76.8 fl — ABNORMAL LOW (ref 78.0–100.0)
Monocytes Absolute: 0.2 10*3/uL (ref 0.1–1.0)
Monocytes Relative: 4.3 % (ref 3.0–12.0)
Neutro Abs: 3.3 10*3/uL (ref 1.4–7.7)
Neutrophils Relative %: 66.1 % (ref 43.0–77.0)
Platelets: 262 10*3/uL (ref 150.0–400.0)
RBC: 5.02 Mil/uL (ref 3.87–5.11)
RDW: 15.1 % (ref 11.5–15.5)
WBC: 5.1 10*3/uL (ref 4.0–10.5)

## 2014-07-30 LAB — BASIC METABOLIC PANEL
BUN: 18 mg/dL (ref 6–23)
CO2: 25 mEq/L (ref 19–32)
Calcium: 9.7 mg/dL (ref 8.4–10.5)
Chloride: 107 mEq/L (ref 96–112)
Creatinine, Ser: 0.92 mg/dL (ref 0.40–1.20)
GFR: 79.29 mL/min (ref >60.00)
Glucose, Bld: 171 mg/dL — ABNORMAL HIGH (ref 70–99)
Potassium: 3.8 mEq/L (ref 3.5–5.1)
Sodium: 141 mEq/L (ref 135–145)

## 2014-07-30 LAB — HEPATIC FUNCTION PANEL
ALT: 16 U/L (ref 0–35)
AST: 12 U/L (ref 0–37)
Albumin: 4.2 g/dL (ref 3.5–5.2)
Alkaline Phosphatase: 109 U/L (ref 39–117)
Bilirubin, Direct: 0 mg/dL (ref 0.0–0.3)
Total Bilirubin: 0.3 mg/dL (ref 0.2–1.2)
Total Protein: 7.4 g/dL (ref 6.0–8.3)

## 2014-07-30 LAB — TSH: TSH: 0.73 u[IU]/mL (ref 0.35–4.50)

## 2014-07-30 LAB — URINALYSIS, ROUTINE W REFLEX MICROSCOPIC
Bilirubin Urine: NEGATIVE
Hgb urine dipstick: NEGATIVE
Ketones, ur: NEGATIVE
Leukocytes, UA: NEGATIVE
Nitrite: NEGATIVE
RBC / HPF: NONE SEEN (ref 0–?)
Specific Gravity, Urine: 1.02 (ref 1.000–1.030)
Total Protein, Urine: NEGATIVE
Urine Glucose: NEGATIVE
Urobilinogen, UA: 0.2 (ref 0.0–1.0)
pH: 5.5 (ref 5.0–8.0)

## 2014-07-30 LAB — MICROALBUMIN / CREATININE URINE RATIO
Creatinine,U: 144.1 mg/dL
Microalb Creat Ratio: 5.1 mg/g (ref 0.0–30.0)
Microalb, Ur: 7.3 mg/dL — ABNORMAL HIGH (ref 0.0–1.9)

## 2014-07-30 LAB — HEMOGLOBIN A1C: Hgb A1c MFr Bld: 7.1 % — ABNORMAL HIGH (ref 4.6–6.5)

## 2014-07-30 LAB — LIPID PANEL
Cholesterol: 191 mg/dL (ref 0–200)
HDL: 41.5 mg/dL (ref >39.00)
NonHDL: 149.5
Total CHOL/HDL Ratio: 5
Triglycerides: 284 mg/dL — ABNORMAL HIGH (ref 0.0–149.0)
VLDL: 56.8 mg/dL — ABNORMAL HIGH (ref 0.0–40.0)

## 2014-07-30 LAB — LDL CHOLESTEROL, DIRECT: Direct LDL: 103 mg/dL

## 2014-07-30 NOTE — Progress Notes (Signed)
Subjective:    Patient ID: Stacey [name], female    DOB: 09/13/1950, 64 y.o.   MRN: [id number]  HPI  Here to f/u; overall doing ok,  Pt denies chest pain, increasing sob or doe, wheezing, orthopnea, PND, increased LE swelling, palpitations, dizziness or syncope.  Pt denies new neurological symptoms such as new headache, or facial or extremity weakness or numbness.  Pt denies polydipsia, polyuria, or low sugar episode.   Pt denies new neurological symptoms such as new headache, or facial or extremity weakness or numbness.   Pt states overall good compliance with meds, mostly trying to follow appropriate diet, with wt overall stable,  but little exercise however. Wt overall stable but lost 4 lbs with "bad hospital food" staying there for 3 wks with husband who had amputation.  Usually wears fitbit Wt Readings from Last 3 Encounters:  07/30/14 171 lb (77.565 kg)  05/13/14 175 lb (79.379 kg)  04/09/14 175 lb 0.6 oz (79.398 kg)   Past Medical History  Diagnosis Date  . HYPERLIPIDEMIA 08/01/2006  . OBESITY 09/16/2006  . SICKLE CELL ANEMIA 08/01/2006  . ANXIETY 09/16/2006  . DEPRESSION 09/16/2006  . HYPERTENSION 08/01/2006  . 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 s/p septal myomectomy 4/12 at [facility] with Dr. Silvestre Mesi;  b. echo 5/12: EF 60-65%, LVOT peak 18 mmHg; grade 1 diast dysfxn, mild SAM (improved since myomectomy;  c. 03/2012 Echo: EF 60-65%, mod-sev basal septal asymm hypertrophy, basal septal HK, LVOT grad , Gr 1 DD, , SAM, Mild MR, nl RV, PASP ; 08/2012 Echo: technically difficult, doubt LVOT obstruction, EF 60%, Gr 1 DD, mod-sev dil LA.  Marland Kitchen ASTHMA 08/01/2006  . VENTRAL HERNIA 12/07/2006  . ANGIOEDEMA 03/07/2008    a. with ACE-I  . DIVERTICULOSIS, COLON, WITH HEMORRHAGE 04/06/2010  . CAD (coronary artery disease)     a. 01/2010 : Minimal plaque at cardiac catheterization - CFX 20%, EF 70%;  b. 08/2012 Cath: Nl Cors, EF 65%.  Marland Kitchen LBBB (left bundle branch block)  06/17/2010  . Chronic diastolic CHF (congestive heart failure) 04/06/2010    a. In setting of HOCM.  Marland Kitchen Impaired glucose tolerance 06/25/2010  . DM (diabetes mellitus) in pregnancy, delivered w/postpartum condition 08/30/2010  . Type II or unspecified type diabetes mellitus without mention of complication, uncontrolled 08/30/2010  . COPD (chronic obstructive pulmonary disease)   . Renal artery aneurysm   . Complete heart block 02/26/2014    a. s/p MDT dual chamber pacemaker 02/2014   Past Surgical History  Procedure Laterality Date  . Abdominal hysterectomy  1987  . Ventral hernia repair  06/2006  . Myomectomy      Septal  . Left heart cath  Aug. 18, 2014    Medtronic heart device  . Cardiac surgery    . Left heart catheterization with coronary angiogram N/A 09/10/2012    Procedure: LEFT HEART CATHETERIZATION WITH CORONARY ANGIOGRAM;  Surgeon: Kathleene Hazel, MD;  Location: [facility] CATH LAB;  Service: Cardiovascular;  Laterality: N/A;  . Electrophysiology study N/A 09/12/2012    Procedure: ELECTROPHYSIOLOGY STUDY;  Surgeon: [name], MD;  Location: [facility] CATH LAB;  Service: Cardiovascular;  Laterality: N/A;  . Permanent pacemaker insertion N/A 02/26/2014    MDT Advisa dual chamber pacemaker implanted by Dr Graciela Husbands for heart block    reports that she quit smoking about 16 years ago. Her smoking use included Cigarettes. She has a 5 pack-year smoking history. She has never used smokeless tobacco. She  reports that she does not drink alcohol or use illicit drugs. family history includes Cancer in her daughter; Clotting disorder in her daughter and other; Colon cancer in her daughter; Diabetes in her paternal grandmother; Heart attack in her father and sister; Heart disease in her daughter and father; Hyperlipidemia in her brother, daughter, and sister; Hypertension in her brother, father, sister, and son; Stroke in her sister. Allergies  Allergen Reactions  . Dairy Aid [Lactase] Diarrhea and Other (See  Comments)    Bloating and gastric distress  . Ace Inhibitors Other (See Comments)    REACTION: angioedema right eye  . Aspirin Other (See Comments)    Bleeding   . Codeine Other (See Comments)    Hallucinations, can take if with someone.  . Soy Allergy Other (See Comments)    Bleeding & cramps   Current Outpatient Prescriptions on File Prior to Visit  Medication Sig Dispense Refill  . albuterol (PROVENTIL HFA;VENTOLIN HFA) 108 (90 BASE) MCG/ACT inhaler Inhale 2 puffs into the lungs every 6 (six) hours as needed for wheezing or shortness of breath. (Patient taking differently: Inhale 1-2 puffs into the lungs every 6 (six) hours as needed for wheezing or shortness of breath. ) 18 g 2  . amLODipine (NORVASC) 10 MG tablet Take 1 tablet (10 mg total) by mouth daily. 90 tablet 3  . azithromycin (ZITHROMAX Z-PAK) 250 MG tablet Use as directed 6 tablet 1  . Blood Glucose Monitoring Suppl (ONE TOUCH ULTRA 2) W/DEVICE KIT Use to check blood sugars daily Dx E11.9 1 each 0  . cetirizine (ZYRTEC) 10 MG tablet Take 10 mg by mouth daily.      . fenofibrate (TRICOR) 145 MG tablet Take 1 tablet (145 mg total) by mouth daily. 90 tablet 3  . fluticasone (FLONASE) 50 MCG/ACT nasal spray Place 2 sprays into the nose daily. (Patient taking differently: Place 1 spray into the nose daily as needed for allergies. ) 16 g 6  . glucose blood test strip 1 each by Other route daily. Use to check blood sugar daily Dx e11.9 100 each 11  . irbesartan (AVAPRO) 300 MG tablet Take 300 mg by mouth at bedtime.    . meclizine (ANTIVERT) 12.5 MG tablet Take 1 tablet (12.5 mg total) by mouth 3 (three) times daily as needed for dizziness. 40 tablet 1  . metFORMIN (GLUCOPHAGE-XR) 500 MG 24 hr tablet Take 1 tablet (500 mg total) by mouth daily with breakfast. 90 tablet 3  . metoprolol succinate (TOPROL-XL) 100 MG 24 hr tablet Take 100 mg by mouth daily. Take with or immediately following a meal.    . metroNIDAZOLE (FLAGYL) 250 MG  tablet Take 1 tablet (250 mg total) by mouth 3 (three) times daily. 30 tablet 0  . ONETOUCH DELICA LANCETS 37S MISC USE TO HELP CHECK BLOOD SUGAR DAILY 200 each 0  . oxcarbazepine (TRILEPTAL) 600 MG tablet Take 600 mg by mouth 2 (two) times daily.    . pantoprazole (PROTONIX) 40 MG tablet Take 1 tablet (40 mg total) by mouth daily. 30 tablet 0  . potassium chloride (K-DUR) 10 MEQ tablet Take 1 tablet (10 mEq total) by mouth 2 (two) times daily. 90 tablet 11  . sucralfate (CARAFATE) 1 GM/10ML suspension Take 10 mLs (1 g total) by mouth 4 (four) times daily -  with meals and at bedtime. 420 mL 0  . topiramate (TOPAMAX) 100 MG tablet Take 100 mg by mouth daily.   0  . atorvastatin (LIPITOR) 10  MG tablet Take 10 mg by mouth daily.    . metoprolol succinate (TOPROL-XL) 100 MG 24 hr tablet TAKE 1 TABLET BY MOUTH EVERY DAY (Patient not taking: Reported on 07/30/2014) 90 tablet 3   No current facility-administered medications on file prior to visit.    Review of Systems  Constitutional: Negative for unusual diaphoresis or night sweats HENT: Negative for ringing in ear or discharge Eyes: Negative for double vision or worsening visual disturbance.  Respiratory: Negative for choking and stridor.   Gastrointestinal: Negative for vomiting or other signifcant bowel change Genitourinary: Negative for hematuria or change in urine volume.  Musculoskeletal: Negative for other MSK pain or swelling Skin: Negative for color change and worsening wound.  Neurological: Negative for tremors and numbness other than noted  Psychiatric/Behavioral: Negative for decreased concentration or agitation other than above       Objective:   Physical Exam BP 120/76 mmHg  Pulse 80  Temp(Src) 98.1 F (36.7 C) (Oral)  Ht $R'5\' 9"'jA$  (1.753 m)  Wt 171 lb (77.565 kg)  BMI 25.24 kg/m2  SpO2 95% /VS noted,  Constitutional: Pt appears in no significant distress HENT: Head: NCAT.  Right Ear: External ear normal.  Left Ear:  External ear normal.  Eyes: . Pupils are equal, round, and reactive to light. Conjunctivae and EOM are normal Neck: Normal range of motion. Neck supple.  Cardiovascular: Normal rate and regular rhythm.   Pulmonary/Chest: Effort normal and breath sounds without rales or wheezing.  Abd:  Soft, NT, ND, + BS Neurological: Pt is alert. Not confused , motor grossly intact Skin: Skin is warm. No rash, no LE edema Psychiatric: Pt behavior is normal. No agitation.      Assessment & Plan:

## 2014-07-30 NOTE — Assessment & Plan Note (Signed)
,  stable overall by history and exam, recent data reviewed with pt, and pt to continue medical treatment as before,  to f/u any worsening symptoms or concerns BP Readings from Last 3 Encounters:  07/30/14 120/76  05/13/14 142/86  04/09/14 132/88

## 2014-07-30 NOTE — Progress Notes (Signed)
Pre visit review using our clinic review tool, if applicable. No additional management support is needed unless otherwise documented below in the visit note. 

## 2014-07-30 NOTE — Assessment & Plan Note (Signed)
stable overall by history and exam, recent data reviewed with pt, and pt to continue medical treatment as before,  to f/u any worsening symptoms or concerns Lab Results  Component Value Date   LDLCALC 79 09/11/2012

## 2014-07-30 NOTE — Patient Instructions (Signed)

## 2014-07-30 NOTE — Assessment & Plan Note (Signed)
stable overall by history and exam, recent data reviewed with pt, and pt to continue medical treatment as before,  to f/u any worsening symptoms or concerns   Lab Results  Component Value Date   HGBA1C 8.1* 05/14/2014

## 2014-08-04 ENCOUNTER — Telehealth: Payer: Self-pay | Admitting: Internal Medicine

## 2014-08-04 NOTE — Telephone Encounter (Signed)
Walk in pt form-[facility] off [facility]

## 2014-08-05 NOTE — Telephone Encounter (Signed)
[facility] by Dr. Graciela HusbandsKlein on 8/2 [facility]. Explained clearance cannot be considered/granted until after this august appt. Aimee, CMA will contact patient to reschedule procedure for after 8/2 office visit. Clearance form will be address at August appt.

## 2014-08-07 ENCOUNTER — Telehealth: Payer: Self-pay | Admitting: Internal Medicine

## 2014-08-07 NOTE — Telephone Encounter (Signed)
Informed patient she could fly with PPM.  Advised she could go through xray machine, but no wand. Also advised to carry her PPM card with her everywhere. Patient verbalized understanding and agreeable to plan.

## 2014-08-07 NOTE — Telephone Encounter (Signed)
New message     Pt has pacemaker.  Pt needs to know if it is okay to fly. Please call to discuss

## 2014-08-26 ENCOUNTER — Encounter: Payer: Self-pay | Admitting: Internal Medicine

## 2014-08-26 ENCOUNTER — Ambulatory Visit (INDEPENDENT_AMBULATORY_CARE_PROVIDER_SITE_OTHER): Payer: BC Managed Care – PPO | Admitting: Internal Medicine

## 2014-08-26 VITALS — BP 128/76 | HR 63 | Ht 69.0 in | Wt 172.0 lb

## 2014-08-26 DIAGNOSIS — I421 Obstructive hypertrophic cardiomyopathy: Secondary | ICD-10-CM

## 2014-08-26 DIAGNOSIS — Z45018 Encounter for adjustment and management of other part of cardiac pacemaker: Secondary | ICD-10-CM | POA: Diagnosis not present

## 2014-08-26 DIAGNOSIS — I442 Atrioventricular block, complete: Secondary | ICD-10-CM | POA: Diagnosis not present

## 2014-08-26 DIAGNOSIS — Z959 Presence of cardiac and vascular implant and graft, unspecified: Secondary | ICD-10-CM

## 2014-08-26 LAB — CUP PACEART INCLINIC DEVICE CHECK
Battery Remaining Longevity: 133 mo
Battery Voltage: 3.04 V
Brady Statistic AP VP Percent: 0.02 %
Brady Statistic AP VS Percent: 19.74 %
Brady Statistic AS VP Percent: 0.03 %
Brady Statistic AS VS Percent: 80.21 %
Brady Statistic RA Percent Paced: 19.76 %
Brady Statistic RV Percent Paced: 0.05 %
Date Time Interrogation Session: 20160802160728
Lead Channel Impedance Value: 323 Ohm
Lead Channel Impedance Value: 475 Ohm
Lead Channel Impedance Value: 513 Ohm
Lead Channel Impedance Value: 570 Ohm
Lead Channel Pacing Threshold Amplitude: 0.625 V
Lead Channel Pacing Threshold Amplitude: 0.875 V
Lead Channel Pacing Threshold Pulse Width: 0.4 ms
Lead Channel Pacing Threshold Pulse Width: 0.4 ms
Lead Channel Sensing Intrinsic Amplitude: 15.5 mV
Lead Channel Sensing Intrinsic Amplitude: 19.25 mV
Lead Channel Sensing Intrinsic Amplitude: 2.625 mV
Lead Channel Sensing Intrinsic Amplitude: 2.875 mV
Lead Channel Setting Pacing Amplitude: 2 V
Lead Channel Setting Pacing Amplitude: 2 V
Lead Channel Setting Pacing Pulse Width: 0.4 ms
Lead Channel Setting Sensing Sensitivity: 2.8 mV
Zone Setting Detection Interval: 350 ms
Zone Setting Detection Interval: 400 ms

## 2014-08-26 NOTE — Progress Notes (Signed)
Patient Care Team: Biagio Borg, MD as PCP - General (Internal Medicine)   HPI  Stacey [name] is a 64 y.o. female Seen in followup for palpitations and near syncope in the setting of hypertrophic cardiomyopathy with prior myectomy;  loop recorder demonstrated intermittent complete heart block and she underwent pacing 1/16.   She is much improved and no recurrent syncope and less DOE  Palpitations>>NSR Past Medical History  Diagnosis Date  . HYPERLIPIDEMIA 08/01/2006  . OBESITY 09/16/2006  . SICKLE CELL ANEMIA 08/01/2006  . ANXIETY 09/16/2006  . DEPRESSION 09/16/2006  . HYPERTENSION 08/01/2006  . 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 s/p septal myomectomy 4/12 at [place] with Dr. Evelina Dun;  b. echo 5/12: EF 60-65%, LVOT peak 18 mmHg; grade 1 diast dysfxn, mild SAM (improved since myomectomy;  c. 03/2012 Echo: EF 60-65%, mod-sev basal septal asymm hypertrophy, basal septal HK, LVOT grad 63mHg, Gr 1 DD, , SAM, Mild MR, nl RV, PASP 383mg; 08/2012 Echo: technically difficult, doubt LVOT obstruction, EF 60%, Gr 1 DD, mod-sev dil LA.  . Marland KitchenSTHMA 08/01/2006  . VENTRAL HERNIA 12/07/2006  . ANGIOEDEMA 03/07/2008    a. with ACE-I  . DIVERTICULOSIS, COLON, WITH HEMORRHAGE 04/06/2010  . CAD (coronary artery disease)     a. 01/2010 : Minimal plaque at cardiac catheterization - CFX 20%, EF 70%;  b. 08/2012 Cath: Nl Cors, EF 65%.  . Marland KitchenBBB (left bundle branch block) 06/17/2010  . Chronic diastolic CHF (congestive heart failure) 04/06/2010    a. In setting of HOCM.  . Marland Kitchenmpaired glucose tolerance 06/25/2010  . DM (diabetes mellitus) in pregnancy, delivered w/postpartum condition 08/30/2010  . Type II or unspecified type diabetes mellitus without mention of complication, uncontrolled 08/30/2010  . COPD (chronic obstructive pulmonary disease)   . Renal artery aneurysm   . Complete heart block 02/26/2014    a. s/p MDT dual chamber pacemaker 02/2014    Past Surgical History  Procedure  Laterality Date  . Abdominal hysterectomy  1987  . Ventral hernia repair  06/2006  . Myomectomy      Septal  . Left heart cath  Aug. 18, 2014    Medtronic heart device  . Cardiac surgery    . Left heart catheterization with coronary angiogram N/A 09/10/2012    Procedure: LEFT HEART CATHETERIZATION WITH CORONARY ANGIOGRAM;  Surgeon: ChBurnell BlanksMD;  [facility];  Service: Cardiovascular;  Laterality: N/A;  . Electrophysiology study N/A 09/12/2012    Procedure: ELECTROPHYSIOLOGY STUDY;  Surgeon: StDeboraha SprangMD;  [facility];  Service: Cardiovascular;  Laterality: N/A;  . Permanent pacemaker insertion N/A 02/26/2014    MDT Advisa dual chamber pacemaker implanted by Dr KlCaryl Comesor heart block    Current Outpatient Prescriptions  Medication Sig Dispense Refill  . albuterol (PROVENTIL HFA;VENTOLIN HFA) 108 (90 BASE) MCG/ACT inhaler Inhale 2 puffs into the lungs every 6 (six) hours as needed for wheezing or shortness of breath. (Patient taking differently: Inhale 1-2 puffs into the lungs every 6 (six) hours as needed for wheezing or shortness of breath. ) 18 g 2  . amLODipine (NORVASC) 10 MG tablet Take 1 tablet (10 mg total) by mouth daily. 90 tablet 3  . atorvastatin (LIPITOR) 10 MG tablet Take 10 mg by mouth daily.    . Marland Kitchenzithromycin (ZITHROMAX Z-PAK) 250 MG tablet Use as directed 6 tablet 1  . Blood Glucose Monitoring Suppl (ONE TOUCH ULTRA 2) W/DEVICE KIT Use to  check blood sugars daily Dx E11.9 1 each 0  . cetirizine (ZYRTEC) 10 MG tablet Take 10 mg by mouth daily.      . fenofibrate (TRICOR) 145 MG tablet Take 1 tablet (145 mg total) by mouth daily. 90 tablet 3  . fluticasone (FLONASE) 50 MCG/ACT nasal spray Place 2 sprays into the nose daily. (Patient taking differently: Place 1 spray into the nose daily as needed for allergies. ) 16 g 6  . glucose blood test strip 1 each by Other route daily. Use to check blood sugar daily Dx e11.9 100 each 11  . irbesartan  (AVAPRO) 300 MG tablet Take 300 mg by mouth at bedtime.    . meclizine (ANTIVERT) 12.5 MG tablet Take 1 tablet (12.5 mg total) by mouth 3 (three) times daily as needed for dizziness. 40 tablet 1  . metFORMIN (GLUCOPHAGE-XR) 500 MG 24 hr tablet Take 1 tablet (500 mg total) by mouth daily with breakfast. 90 tablet 3  . metoprolol succinate (TOPROL-XL) 100 MG 24 hr tablet Take 100 mg by mouth daily. Take with or immediately following a meal.    . metoprolol succinate (TOPROL-XL) 100 MG 24 hr tablet TAKE 1 TABLET BY MOUTH EVERY DAY 90 tablet 3  . metroNIDAZOLE (FLAGYL) 250 MG tablet Take 1 tablet (250 mg total) by mouth 3 (three) times daily. 30 tablet 0  . ONETOUCH DELICA LANCETS 86L MISC USE TO HELP CHECK BLOOD SUGAR DAILY 200 each 0  . oxcarbazepine (TRILEPTAL) 600 MG tablet Take 600 mg by mouth 2 (two) times daily.    . pantoprazole (PROTONIX) 40 MG tablet Take 1 tablet (40 mg total) by mouth daily. 30 tablet 0  . potassium chloride (K-DUR) 10 MEQ tablet Take 1 tablet (10 mEq total) by mouth 2 (two) times daily. 90 tablet 11  . sucralfate (CARAFATE) 1 GM/10ML suspension Take 10 mLs (1 g total) by mouth 4 (four) times daily -  with meals and at bedtime. 420 mL 0  . topiramate (TOPAMAX) 100 MG tablet Take 100 mg by mouth daily.   0   No current facility-administered medications for this visit.    Allergies  Allergen Reactions  . Dairy Aid [Lactase] Diarrhea and Other (See Comments)    Bloating and gastric distress  . Ace Inhibitors Other (See Comments)    REACTION: angioedema right eye  . Aspirin Other (See Comments)    Bleeding   . Codeine Other (See Comments)    Hallucinations, can take if with someone.  . Soy Allergy Other (See Comments)    Bleeding & cramps    Review of Systems negative except from HPI and PMH  Physical Exam BP 128/76 mmHg  Pulse 63  Ht 5' 9"  (1.753 m)  Wt 172 lb (78.019 kg)  BMI 25.39 kg/m2 Well developed and nourished in no acute distress HENT normal Neck  supple with JVP-flat Clear sternotomnoy Regular rate and rhythm,2/6 murmur Abd-soft with active BS No Clubbing cyanosis edema Skin-warm and dry A & Oriented  Grossly normal sensory and motor function  Loop recorder interrogation demonstrates no intercurrent recorded events  ECG demonstrates sinus rhythm at 60  Left bundle branch block  intervals 18/16/44 Assessment and  Plan  HCM  S/p myectomy  Left bundle branch block  Intermittent complete heart block  Pacemaker-St. Jude    She is much improved following pacemaker implantation.  She is euvolemic continue current medications  The family is going through surveillance, 2 of the children have been evaluated at  the daughter has not yet been. We do not have any of these data.  We spent more than 50% of our >25 min visit in face to face counseling regarding the above

## 2014-08-26 NOTE — Patient Instructions (Signed)
Medication Instructions:  Your physician recommends that you continue on your current medications as directed. Please refer to the Current Medication list given to you today.  Labwork: None ordered  Testing/Procedures: None ordered  Follow-Up: Remote monitoring is used to monitor your Pacemaker of ICD from home. This monitoring reduces the number of office visits required to check your device to one time per year. It allows [place] to keep an eye on the functioning of your device to ensure it is working properly. You are scheduled for a device check from home on 11/25/2014. You may send your transmission at any time that day. If you have a wireless device, the transmission will be sent automatically. After your physician reviews your transmission, you will receive a postcard with your next transmission date.  Your physician wants you to follow-up in: February 2017 with Dr. Graciela Husbands.  You will receive a reminder letter in the mail two months in advance. If you don't receive a letter, please call our office to schedule the follow-up appointment.  Any Other Special Instructions Will Be Listed Below (If Applicable). Thank you for choosing [facility]!!

## 2014-08-27 NOTE — Telephone Encounter (Signed)
[facility] (faxed clearance, ekg, and PPM interrogation report) (Attempted several times yesterday and this morning to fax # [phone])  Called and received fax number of [phone] - this was successful transmission.

## 2014-09-16 ENCOUNTER — Other Ambulatory Visit: Payer: Self-pay

## 2014-09-16 MED ORDER — IRBESARTAN 300 MG PO TABS: 300.0000 mg | ORAL_TABLET | Freq: Every day | ORAL | Status: DC

## 2014-11-12 ENCOUNTER — Encounter: Payer: Self-pay | Admitting: Internal Medicine

## 2014-11-14 ENCOUNTER — Encounter: Payer: Self-pay | Admitting: Internal Medicine

## 2014-11-14 ENCOUNTER — Ambulatory Visit (INDEPENDENT_AMBULATORY_CARE_PROVIDER_SITE_OTHER): Payer: BC Managed Care – PPO | Admitting: Internal Medicine

## 2014-11-14 VITALS — BP 138/86 | HR 80 | Temp 98.7°F | Wt 172.0 lb

## 2014-11-14 DIAGNOSIS — R062 Wheezing: Secondary | ICD-10-CM | POA: Diagnosis not present

## 2014-11-14 DIAGNOSIS — J209 Acute bronchitis, unspecified: Secondary | ICD-10-CM | POA: Diagnosis not present

## 2014-11-14 DIAGNOSIS — E119 Type 2 diabetes mellitus without complications: Secondary | ICD-10-CM | POA: Diagnosis not present

## 2014-11-14 DIAGNOSIS — I10 Essential (primary) hypertension: Secondary | ICD-10-CM

## 2014-11-14 MED ORDER — METHYLPREDNISOLONE ACETATE 80 MG/ML IJ SUSP
80.0000 mg | Freq: Once | INTRAMUSCULAR | Status: AC
  Administered 2014-11-14: 80 mg via INTRAMUSCULAR

## 2014-11-14 MED ORDER — ALBUTEROL SULFATE HFA 108 (90 BASE) MCG/ACT IN AERS: 2.0000 | INHALATION_SPRAY | Freq: Four times a day (QID) | RESPIRATORY_TRACT | Status: DC | PRN

## 2014-11-14 MED ORDER — HYDROCODONE-HOMATROPINE 5-1.5 MG/5ML PO SYRP: 5.0000 mL | ORAL_SOLUTION | Freq: Four times a day (QID) | ORAL | Status: DC | PRN

## 2014-11-14 MED ORDER — LEVOFLOXACIN 250 MG PO TABS: 250.0000 mg | ORAL_TABLET | Freq: Every day | ORAL | Status: DC

## 2014-11-14 NOTE — Progress Notes (Signed)
Pre visit review using our clinic review tool, if applicable. No additional management support is needed unless otherwise documented below in the visit note. 

## 2014-11-14 NOTE — Addendum Note (Signed)
Addended by: Alonna MiniumWILLIAMS, Isaack Preble P on: 11/14/2014 03:37 PM   Modules accepted: Orders

## 2014-11-14 NOTE — Assessment & Plan Note (Signed)
stable overall by history and exam, recent data reviewed with pt, and pt to continue medical treatment as before,  to f/u any worsening symptoms or concerns Lab Results  Component Value Date   HGBA1C 7.1* 07/30/2014   Pt to call for worsening cbg on steroid tx

## 2014-11-14 NOTE — Progress Notes (Signed)
Subjective:    Patient ID: Stacey [name], female    DOB: [birth year], 64 y.o.   MRN: [id number]  HPI  Here with acute onset mild to mod 2-3 days ST, HA, general weakness and malaise, with prod cough greenish sputum, but Pt denies chest pain, increased sob or doe, wheezing, orthopnea, PND, increased LE swelling, palpitations, dizziness or syncope, except for onset wheezing with mild sob , out of inhaler. Pt denies new neurological symptoms such as new headache, or facial or extremity weakness or numbness   Pt denies polydipsia, polyuria. Past Medical History  Diagnosis Date  . HYPERLIPIDEMIA 08/01/2006  . OBESITY 09/16/2006  . SICKLE CELL ANEMIA 08/01/2006  . ANXIETY 09/16/2006  . DEPRESSION 09/16/2006  . HYPERTENSION 08/01/2006  . 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 s/p septal myomectomy 4/12 at [place] with Dr. Evelina Dun;  b. echo 5/12: EF 60-65%, LVOT peak 18 mmHg; grade 1 diast dysfxn, mild SAM (improved since myomectomy;  c. 03/2012 Echo: EF 60-65%, mod-sev basal septal asymm hypertrophy, basal septal HK, LVOT grad 90mHg, Gr 1 DD, , SAM, Mild MR, nl RV, PASP 361mg; 08/2012 Echo: technically difficult, doubt LVOT obstruction, EF 60%, Gr 1 DD, mod-sev dil LA.  . Marland KitchenSTHMA 08/01/2006  . VENTRAL HERNIA 12/07/2006  . ANGIOEDEMA 03/07/2008    a. with ACE-I  . DIVERTICULOSIS, COLON, WITH HEMORRHAGE 04/06/2010  . CAD (coronary artery disease)     a. 01/2010 : Minimal plaque at cardiac catheterization - CFX 20%, EF 70%;  b. 08/2012 Cath: Nl Cors, EF 65%.  . Marland KitchenBBB (left bundle branch block) 06/17/2010  . Chronic diastolic CHF (congestive heart failure) (HCPitts3/13/2012    a. In setting of HOCM.  . Marland Kitchenmpaired glucose tolerance 06/25/2010  . DM (diabetes mellitus) in pregnancy, delivered w/postpartum condition 08/30/2010  . Type II or unspecified type diabetes mellitus without mention of complication, uncontrolled 08/30/2010  . COPD (chronic obstructive pulmonary disease) ([place]  . Renal artery  aneurysm ([facility]  . Complete heart block ([place]CM[place]2/03/2014    a. s/p MDT dual chamber pacemaker 02/2014   Past Surgical History  Procedure Laterality Date  . Abdominal hysterectomy  1987  . Ventral hernia repair  06/2006  . Myomectomy      Septal  . Left heart cath  Aug. 18, 2014    Medtronic heart device  . Cardiac surgery    . Left heart catheterization with coronary angiogram N/A 09/10/2012    Procedure: LEFT HEART CATHETERIZATION WITH CORONARY ANGIOGRAM;  Surgeon: ChBurnell BlanksMD;  [facility];  Service: Cardiovascular;  Laterality: N/A;  . Electrophysiology study N/A 09/12/2012    Procedure: ELECTROPHYSIOLOGY STUDY;  Surgeon: StDeboraha SprangMD;  [facility];  Service: Cardiovascular;  Laterality: N/A;  . Permanent pacemaker insertion N/A 02/26/2014    MDT Advisa dual chamber pacemaker implanted by Dr KlCaryl Comesor heart block    reports that she quit smoking about 16 years ago. Her smoking use included Cigarettes. She has a 5 pack-year smoking history. She has never used smokeless tobacco. She reports that she does not drink alcohol or use illicit drugs. family history includes Cancer in her daughter; Clotting disorder in her daughter and other; Colon cancer in her daughter; Diabetes in her paternal grandmother; Heart attack in her father and sister; Heart disease in her daughter and father; Hyperlipidemia in her brother, daughter, and sister; Hypertension in her brother, father, sister, and son; Stroke in her sister. Allergies  Allergen Reactions  . Dairy Aid [Lactase] Diarrhea and Other (See Comments)    Bloating and gastric distress  . Ace Inhibitors Other (See Comments)    REACTION: angioedema right eye  . Aspirin Other (See Comments)    Bleeding   . Codeine Other (See Comments)    Hallucinations, can take if with someone.  . Soy Allergy Other (See Comments)    Bleeding & cramps   Current Outpatient Prescriptions on File Prior to Visit  Medication Sig  Dispense Refill  . albuterol (PROVENTIL HFA;VENTOLIN HFA) 108 (90 BASE) MCG/ACT inhaler Inhale 2 puffs into the lungs every 6 (six) hours as needed for wheezing or shortness of breath. (Patient taking differently: Inhale 1-2 puffs into the lungs every 6 (six) hours as needed for wheezing or shortness of breath. ) 18 g 2  . amLODipine (NORVASC) 10 MG tablet Take 1 tablet (10 mg total) by mouth daily. 90 tablet 3  . atorvastatin (LIPITOR) 10 MG tablet Take 10 mg by mouth daily.    Marland Kitchen azithromycin (ZITHROMAX Z-PAK) 250 MG tablet Use as directed 6 tablet 1  . Blood Glucose Monitoring Suppl (ONE TOUCH ULTRA 2) W/DEVICE KIT Use to check blood sugars daily Dx E11.9 1 each 0  . cetirizine (ZYRTEC) 10 MG tablet Take 10 mg by mouth daily.      . fenofibrate (TRICOR) 145 MG tablet Take 1 tablet (145 mg total) by mouth daily. 90 tablet 3  . fluticasone (FLONASE) 50 MCG/ACT nasal spray Place 2 sprays into the nose daily. (Patient taking differently: Place 1 spray into the nose daily as needed for allergies. ) 16 g 6  . glucose blood test strip 1 each by Other route daily. Use to check blood sugar daily Dx e11.9 100 each 11  . irbesartan (AVAPRO) 300 MG tablet Take 1 tablet (300 mg total) by mouth at bedtime. 90 tablet 1  . meclizine (ANTIVERT) 12.5 MG tablet Take 1 tablet (12.5 mg total) by mouth 3 (three) times daily as needed for dizziness. 40 tablet 1  . metFORMIN (GLUCOPHAGE-XR) 500 MG 24 hr tablet Take 1 tablet (500 mg total) by mouth daily with breakfast. 90 tablet 3  . metoprolol succinate (TOPROL-XL) 100 MG 24 hr tablet Take 100 mg by mouth daily. Take with or immediately following a meal.    . metoprolol succinate (TOPROL-XL) 100 MG 24 hr tablet TAKE 1 TABLET BY MOUTH EVERY DAY 90 tablet 3  . metroNIDAZOLE (FLAGYL) 250 MG tablet Take 1 tablet (250 mg total) by mouth 3 (three) times daily. 30 tablet 0  . ONETOUCH DELICA LANCETS 28B MISC USE TO HELP CHECK BLOOD SUGAR DAILY 200 each 0  . oxcarbazepine  (TRILEPTAL) 600 MG tablet Take 600 mg by mouth 2 (two) times daily.    . pantoprazole (PROTONIX) 40 MG tablet Take 1 tablet (40 mg total) by mouth daily. 30 tablet 0  . potassium chloride (K-DUR) 10 MEQ tablet Take 1 tablet (10 mEq total) by mouth 2 (two) times daily. 90 tablet 11  . sucralfate (CARAFATE) 1 GM/10ML suspension Take 10 mLs (1 g total) by mouth 4 (four) times daily -  with meals and at bedtime. 420 mL 0  . topiramate (TOPAMAX) 100 MG tablet Take 100 mg by mouth daily.   0   No current facility-administered medications on file prior to visit.   Review of Systems  Constitutional: Negative for unusual diaphoresis or night sweats HENT: Negative for ringing in ear or discharge Eyes: Negative for  double vision or worsening visual disturbance.  Respiratory: Negative for choking and stridor.   Gastrointestinal: Negative for vomiting or other signifcant bowel change Genitourinary: Negative for hematuria or change in urine volume.  Musculoskeletal: Negative for other MSK pain or swelling Skin: Negative for color change and worsening wound.  Neurological: Negative for tremors and numbness other than noted  Psychiatric/Behavioral: Negative for decreased concentration or agitation other than above       Objective:   Physical Exam BP 138/86 mmHg  Pulse 80  Temp(Src) 98.7 F (37.1 C)  Wt 172 lb (78.019 kg)  SpO2 95% VS noted, mild ill Constitutional: Pt appears in no significant distress HENT: Head: NCAT.  Right Ear: External ear normal.  Left Ear: External ear normal.  Eyes: . Pupils are equal, round, and reactive to light. Conjunctivae and EOM are normal Bilat tm's with mild erythema.  Max sinus areas non tender.  Pharynx with mild erythema, no exudate Neck: Normal range of motion. Neck supple.  Cardiovascular: Normal rate and regular rhythm.   Pulmonary/Chest: Effort normal and breath sounds without rales but few wheezing. bilat  Neurological: Pt is alert. Not confused ,  motor grossly intact Skin: Skin is warm. No rash, no LE edema Psychiatric: Pt behavior is normal. No agitation.      Assessment & Plan:

## 2014-11-14 NOTE — Assessment & Plan Note (Signed)
stable overall by history and exam, recent data reviewed with pt, and pt to continue medical treatment as before,  to f/u any worsening symptoms or concerns BP Readings from Last 3 Encounters:  11/14/14 138/86  08/26/14 128/76  07/30/14 120/76

## 2014-11-14 NOTE — Assessment & Plan Note (Signed)
Mild to mod, for antibx course,  to f/u any worsening symptoms or concerns 

## 2014-11-14 NOTE — Patient Instructions (Signed)
You had the steroid shot today  Please take all new medication as prescribed - the antibiotic, and cough medicine  Please continue all other medications as before, and refills have been done if requested - the inhaler  Please have the pharmacy call with any other refills you may need.  Please continue your efforts at being more active, low cholesterol diet, and weight control.  Please keep your appointments with your specialists as you may have planned

## 2014-11-14 NOTE — Assessment & Plan Note (Addendum)
Mild, for depomedrol IM,  Inhaler refill, to f/u any worsening symptoms or concerns

## 2014-11-21 ENCOUNTER — Telehealth: Payer: Self-pay

## 2014-11-21 NOTE — Telephone Encounter (Signed)
PA started via cover my meds  KEY: [id number]

## 2014-11-25 ENCOUNTER — Telehealth: Payer: Self-pay | Admitting: Cardiology

## 2014-11-25 ENCOUNTER — Encounter: Payer: BC Managed Care – PPO | Admitting: *Deleted

## 2014-11-25 NOTE — Telephone Encounter (Signed)
LMOVM reminding pt to send remote transmission.   

## 2014-11-26 ENCOUNTER — Encounter: Payer: Self-pay | Admitting: Cardiology

## 2014-11-26 ENCOUNTER — Telehealth: Payer: Self-pay | Admitting: Internal Medicine

## 2014-11-26 NOTE — Telephone Encounter (Signed)
New Message  Pt calling to see if remote signal was sent successfully 11/1-  Please call back and discuss.

## 2014-11-27 NOTE — Telephone Encounter (Signed)
LMOVM informing pt that we have not received her transmission.

## 2014-11-29 NOTE — Telephone Encounter (Signed)
Received fax that PA was not required.  Sent faxed message to pharmacy.

## 2014-12-05 ENCOUNTER — Telehealth: Payer: Self-pay | Admitting: Internal Medicine

## 2014-12-05 NOTE — Telephone Encounter (Signed)
New problem   Pt need to speak to someone concerning scheduling a remote check. Please advise

## 2014-12-05 NOTE — Telephone Encounter (Signed)
Spoke with pt and instructed her how to send manual transmission using her home monitor. Pt verbalized understanding.

## 2014-12-05 NOTE — Telephone Encounter (Signed)
Attempted to return pt call. LMOVM for pt to return call.

## 2014-12-15 ENCOUNTER — Ambulatory Visit: Payer: BC Managed Care – PPO | Admitting: Family

## 2014-12-17 ENCOUNTER — Ambulatory Visit (INDEPENDENT_AMBULATORY_CARE_PROVIDER_SITE_OTHER): Payer: BC Managed Care – PPO | Admitting: *Deleted

## 2014-12-17 DIAGNOSIS — I442 Atrioventricular block, complete: Secondary | ICD-10-CM | POA: Diagnosis not present

## 2014-12-17 NOTE — Progress Notes (Signed)
Remote pacemaker transmission.   

## 2014-12-23 ENCOUNTER — Ambulatory Visit (INDEPENDENT_AMBULATORY_CARE_PROVIDER_SITE_OTHER): Payer: BC Managed Care – PPO | Admitting: Family

## 2014-12-23 ENCOUNTER — Encounter: Payer: Self-pay | Admitting: Family

## 2014-12-23 VITALS — BP 137/75 | HR 70 | Temp 98.8°F | Resp 16 | Ht 69.0 in | Wt 174.0 lb

## 2014-12-23 DIAGNOSIS — I722 Aneurysm of renal artery: Secondary | ICD-10-CM
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
## 2014-12-23 NOTE — Telephone Encounter (Signed)
Received a fax that the proventil was approved.

## 2014-12-29 LAB — CUP PACEART REMOTE DEVICE CHECK
Battery Remaining Longevity: 123 mo
Battery Voltage: 3.03 V
Brady Statistic AP VP Percent: 0.02 %
Brady Statistic AP VS Percent: 26.48 %
Brady Statistic AS VP Percent: 0.02 %
Brady Statistic AS VS Percent: 73.48 %
Brady Statistic RA Percent Paced: 26.5 %
Brady Statistic RV Percent Paced: 0.04 %
Date Time Interrogation Session: 20161123182013
Implantable Lead Implant Date: 20160203
Implantable Lead Implant Date: 20160203
Implantable Lead Location: 753859
Implantable Lead Location: 753860
Implantable Lead Model: 5076
Implantable Lead Model: 5076
Lead Channel Impedance Value: 342 Ohm
Lead Channel Impedance Value: 456 Ohm
Lead Channel Impedance Value: 532 Ohm
Lead Channel Impedance Value: 589 Ohm
Lead Channel Pacing Threshold Amplitude: 0.625 V
Lead Channel Pacing Threshold Amplitude: 0.75 V
Lead Channel Pacing Threshold Pulse Width: 0.4 ms
Lead Channel Pacing Threshold Pulse Width: 0.4 ms
Lead Channel Sensing Intrinsic Amplitude: 18.75 mV
Lead Channel Sensing Intrinsic Amplitude: 18.75 mV
Lead Channel Sensing Intrinsic Amplitude: 2.75 mV
Lead Channel Sensing Intrinsic Amplitude: 2.75 mV
Lead Channel Setting Pacing Amplitude: 1.5 V
Lead Channel Setting Pacing Amplitude: 2 V
Lead Channel Setting Pacing Pulse Width: 0.4 ms
Lead Channel Setting Sensing Sensitivity: 2.8 mV

## 2014-12-30 ENCOUNTER — Encounter: Payer: Self-pay | Admitting: Cardiology

## 2015-01-13 ENCOUNTER — Encounter: Payer: Self-pay | Admitting: Cardiology

## 2015-01-19 ENCOUNTER — Other Ambulatory Visit: Payer: Self-pay | Admitting: Internal Medicine

## 2015-02-24 ENCOUNTER — Other Ambulatory Visit: Payer: Self-pay | Admitting: Internal Medicine

## 2015-03-11 ENCOUNTER — Other Ambulatory Visit: Payer: Self-pay | Admitting: Internal Medicine

## 2015-03-16 ENCOUNTER — Encounter: Payer: Self-pay | Admitting: Internal Medicine

## 2015-03-16 ENCOUNTER — Ambulatory Visit (INDEPENDENT_AMBULATORY_CARE_PROVIDER_SITE_OTHER): Payer: BC Managed Care – PPO | Admitting: Internal Medicine

## 2015-03-16 VITALS — BP 140/80 | HR 79 | Temp 98.5°F | Wt 174.0 lb

## 2015-03-16 DIAGNOSIS — I5032 Chronic diastolic (congestive) heart failure: Secondary | ICD-10-CM | POA: Diagnosis not present

## 2015-03-16 DIAGNOSIS — J069 Acute upper respiratory infection, unspecified: Secondary | ICD-10-CM

## 2015-03-16 DIAGNOSIS — J4520 Mild intermittent asthma, uncomplicated: Secondary | ICD-10-CM

## 2015-03-16 MED ORDER — CEFDINIR 300 MG PO CAPS: 300.0000 mg | ORAL_CAPSULE | Freq: Two times a day (BID) | ORAL | Status: DC

## 2015-03-16 NOTE — Assessment & Plan Note (Signed)
No bronchospasms at present

## 2015-03-16 NOTE — Assessment & Plan Note (Signed)
Compensated sx's

## 2015-03-16 NOTE — Assessment & Plan Note (Addendum)
Viral vs other Strep test, flu test Cefdinir if worse

## 2015-03-16 NOTE — Progress Notes (Signed)
Subjective:  Patient ID: Stacey [name], female    DOB: 1950/06/18  Age: 65 y.o. MRN: [id number]  CC: No chief complaint on file.   HPI Stacey [name] presents for chills, ST, diarrhea  Outpatient Prescriptions Prior to Visit  Medication Sig Dispense Refill  . albuterol (PROVENTIL HFA;VENTOLIN HFA) 108 (90 BASE) MCG/ACT inhaler Inhale 2 puffs into the lungs every 6 (six) hours as needed for wheezing or shortness of breath. 18 g 5  . amLODipine (NORVASC) 10 MG tablet TAKE 1 TABLET BY MOUTH EVERY DAY 90 tablet 1  . atorvastatin (LIPITOR) 10 MG tablet Take 10 mg by mouth daily.    . Blood Glucose Monitoring Suppl (ONE TOUCH ULTRA 2) W/DEVICE KIT Use to check blood sugars daily Dx E11.9 1 each 0  . cetirizine (ZYRTEC) 10 MG tablet Take 10 mg by mouth daily.      . fenofibrate (TRICOR) 145 MG tablet TAKE 1 TABLET BY MOUTH DAILY 90 tablet 0  . fluticasone (FLONASE) 50 MCG/ACT nasal spray Place 2 sprays into the nose daily. (Patient taking differently: Place 1 spray into the nose daily as needed for allergies. ) 16 g 6  . glucose blood test strip 1 each by Other route daily. Use to check blood sugar daily Dx e11.9 100 each 11  . HYDROcodone-homatropine (HYCODAN) 5-1.5 MG/5ML syrup Take 5 mLs by mouth every 6 (six) hours as needed for cough. 180 mL 0  . irbesartan (AVAPRO) 300 MG tablet TAKE 1 TABLET(300 MG) BY MOUTH AT BEDTIME 90 tablet 0  . meclizine (ANTIVERT) 12.5 MG tablet Take 1 tablet (12.5 mg total) by mouth 3 (three) times daily as needed for dizziness. 40 tablet 1  . metFORMIN (GLUCOPHAGE-XR) 500 MG 24 hr tablet Take 1 tablet (500 mg total) by mouth daily with breakfast. 90 tablet 3  . metoprolol succinate (TOPROL-XL) 100 MG 24 hr tablet TAKE 1 TABLET BY MOUTH EVERY DAY 90 tablet 3  . ONETOUCH DELICA LANCETS 32I MISC USE TO HELP CHECK BLOOD SUGAR DAILY 200 each 0  . oxcarbazepine (TRILEPTAL) 600 MG tablet Take 600 mg by mouth 2 (two) times daily.    .  pantoprazole (PROTONIX) 40 MG tablet Take 1 tablet (40 mg total) by mouth daily. 30 tablet 0  . potassium chloride (K-DUR) 10 MEQ tablet Take 1 tablet (10 mEq total) by mouth 2 (two) times daily. 90 tablet 11  . potassium chloride (K-DUR,KLOR-CON) 10 MEQ tablet TAKE 2 TABLETS BY MOUTH DAILY 180 tablet 1  . sucralfate (CARAFATE) 1 GM/10ML suspension Take 10 mLs (1 g total) by mouth 4 (four) times daily -  with meals and at bedtime. 420 mL 0  . topiramate (TOPAMAX) 100 MG tablet Take 100 mg by mouth daily.   0  . azithromycin (ZITHROMAX Z-PAK) 250 MG tablet Use as directed 6 tablet 1  . levofloxacin (LEVAQUIN) 250 MG tablet Take 1 tablet (250 mg total) by mouth daily. 10 tablet 0  . metroNIDAZOLE (FLAGYL) 250 MG tablet Take 1 tablet (250 mg total) by mouth 3 (three) times daily. 30 tablet 0   No facility-administered medications prior to visit.    ROS Review of Systems  Constitutional: Positive for fever, chills and fatigue. Negative for activity change, appetite change and unexpected weight change.  HENT: Positive for congestion, sinus pressure, sore throat and trouble swallowing. Negative for mouth sores, postnasal drip and voice change.   Eyes: Negative for visual disturbance.  Respiratory: Negative for cough, chest tightness, shortness of  breath and wheezing.   Gastrointestinal: Negative for nausea and abdominal pain.  Genitourinary: Negative for frequency, difficulty urinating and vaginal pain.  Musculoskeletal: Negative for back pain and gait problem.  Skin: Negative for pallor and rash.  Neurological: Negative for dizziness, tremors, weakness, numbness and headaches.  Psychiatric/Behavioral: Negative for confusion and sleep disturbance.    Objective:  BP 140/80 mmHg  Pulse 79  Temp(Src) 98.5 F (36.9 C) (Oral)  Wt 174 lb (78.926 kg)  SpO2 94%  BP Readings from Last 3 Encounters:  03/16/15 140/80  12/23/14 137/75  11/14/14 138/86    Wt Readings from Last 3 Encounters:    03/16/15 174 lb (78.926 kg)  12/23/14 174 lb (78.926 kg)  11/14/14 172 lb (78.019 kg)    Physical Exam  Constitutional: She appears well-developed. No distress.  HENT:  Head: Normocephalic.  Right Ear: External ear normal.  Left Ear: External ear normal.  Nose: Nose normal.  Mouth/Throat: Oropharynx is clear and moist. No oropharyngeal exudate.  Eyes: Conjunctivae are normal. Pupils are equal, round, and reactive to light. Right eye exhibits no discharge. Left eye exhibits no discharge.  Neck: Normal range of motion. Neck supple. No JVD present. No tracheal deviation present. No thyromegaly present.  Cardiovascular: Normal rate and regular rhythm.   Murmur heard. Pulmonary/Chest: No stridor. No respiratory distress. She has no wheezes.  Abdominal: Soft. Bowel sounds are normal. She exhibits no distension and no mass. There is no tenderness. There is no rebound and no guarding.  Musculoskeletal: She exhibits no edema or tenderness.  Lymphadenopathy:    She has no cervical adenopathy.  Neurological: She displays normal reflexes. No cranial nerve deficit. She exhibits normal muscle tone. Coordination normal.  Skin: No rash noted. No erythema.  Psychiatric: She has a normal mood and affect. Her behavior is normal. Judgment and thought content normal.  Looks tired Eryth throat Pacemaker on L  Strep (-) Flu (-)  Lab Results  Component Value Date   WBC 5.1 07/30/2014   HGB 12.6 07/30/2014   HCT 38.5 07/30/2014   PLT 262.0 07/30/2014   GLUCOSE 171* 07/30/2014   CHOL 191 07/30/2014   TRIG 284.0* 07/30/2014   HDL 41.50 07/30/2014   LDLDIRECT 103.0 07/30/2014   LDLCALC 79 09/11/2012   ALT 16 07/30/2014   AST 12 07/30/2014   NA 141 07/30/2014   K 3.8 07/30/2014   CL 107 07/30/2014   CREATININE 0.92 07/30/2014   BUN 18 07/30/2014   CO2 25 07/30/2014   TSH 0.73 07/30/2014   INR 1.23 10/21/2013   HGBA1C 7.1* 07/30/2014   MICROALBUR 7.3* 07/30/2014    Dg Chest 2  View  02/27/2014  CLINICAL DATA:  Status post permanent pacemaker insertion EXAM: CHEST  2 VIEW COMPARISON:  PA and lateral chest x-ray of October 21, 2013 FINDINGS: The lungs are adequately inflated. There is no pneumothorax or pleural effusion. The cardiac silhouette is mildly enlarged but stable. The pulmonary vascularity is not engorged. The permanent pacemaker is in appropriate position radiographically. The generator projects over the left pectoral region. There are 7 intact sternal wires. IMPRESSION: There is no postprocedure complication following permanent pacemaker placement. Electronically Signed   By: David  Martinique   On: 02/27/2014 07:55    Assessment & Plan:   Diagnoses and all orders for this visit:  Acute URI -     POCT rapid strep A -     POCT Influenza A/B  Other orders -     cefdinir (OMNICEF)  300 MG capsule; Take 1 capsule (300 mg total) by mouth 2 (two) times daily.  I have discontinued [name]'s azithromycin, metroNIDAZOLE, and levofloxacin. I am also having her start on cefdinir. Additionally, I am having her maintain her cetirizine, fluticasone, oxcarbazepine, atorvastatin, pantoprazole, sucralfate, potassium chloride, topiramate, meclizine, metoprolol succinate, metFORMIN, glucose blood, ONE TOUCH ULTRA 2, ONETOUCH DELICA LANCETS 06E, HYDROcodone-homatropine, albuterol, amLODipine, potassium chloride, fenofibrate, and irbesartan.  Meds ordered this encounter  Medications  . cefdinir (OMNICEF) 300 MG capsule    Sig: Take 1 capsule (300 mg total) by mouth 2 (two) times daily.    Dispense:  20 capsule    Refill:  0     Follow-up: No Follow-up on file.  Walker Kehr, MD

## 2015-03-16 NOTE — Patient Instructions (Signed)
Use over-the-counter  "cold" medicines  such as "Afrin" nasal spray for nasal congestion as directed instead. Use" Delsym" or" Robitussin" cough syrup varietis for cough.  You can use plain "Tylenol" or "Advil" for fever, chills and achyness. Use Halls or Ricola cough drops.   "Common cold" symptoms are usually triggered by a virus.  The antibiotics are usually not necessary. On average, a" viral cold" illness would take 4-7 days to resolve. Please, make an appointment if you are not better or if you're worse.  

## 2015-03-25 ENCOUNTER — Encounter: Payer: Self-pay | Admitting: *Deleted

## 2015-04-12 ENCOUNTER — Other Ambulatory Visit: Payer: Self-pay | Admitting: Internal Medicine

## 2015-05-23 ENCOUNTER — Other Ambulatory Visit: Payer: Self-pay | Admitting: Internal Medicine

## 2015-05-24 ENCOUNTER — Other Ambulatory Visit: Payer: Self-pay | Admitting: Internal Medicine

## 2015-06-07 ENCOUNTER — Other Ambulatory Visit: Payer: Self-pay | Admitting: Internal Medicine

## 2015-06-08 ENCOUNTER — Encounter: Payer: Self-pay | Admitting: Family Medicine

## 2015-06-08 ENCOUNTER — Ambulatory Visit (INDEPENDENT_AMBULATORY_CARE_PROVIDER_SITE_OTHER): Payer: BC Managed Care – PPO | Admitting: Family Medicine

## 2015-06-08 VITALS — BP 126/61 | HR 67 | Temp 99.8°F | Ht 69.0 in | Wt 174.2 lb

## 2015-06-08 DIAGNOSIS — J209 Acute bronchitis, unspecified: Secondary | ICD-10-CM | POA: Diagnosis not present

## 2015-06-08 DIAGNOSIS — J029 Acute pharyngitis, unspecified: Secondary | ICD-10-CM

## 2015-06-08 DIAGNOSIS — H8110 Benign paroxysmal vertigo, unspecified ear: Secondary | ICD-10-CM | POA: Diagnosis not present

## 2015-06-08 LAB — POCT RAPID STREP A (OFFICE): Rapid Strep A Screen: NEGATIVE

## 2015-06-08 MED ORDER — ONDANSETRON HCL 4 MG PO TABS: 4.0000 mg | ORAL_TABLET | Freq: Three times a day (TID) | ORAL | Status: DC | PRN

## 2015-06-08 MED ORDER — DOXYCYCLINE HYCLATE 100 MG PO CAPS: 100.0000 mg | ORAL_CAPSULE | Freq: Two times a day (BID) | ORAL | Status: DC

## 2015-06-08 MED ORDER — MECLIZINE HCL 12.5 MG PO TABS: 12.5000 mg | ORAL_TABLET | Freq: Three times a day (TID) | ORAL | Status: DC | PRN

## 2015-06-08 NOTE — Patient Instructions (Signed)
We will treat you with doxycycline for bronchitis, and I refilled your medications for vertigo. Try to take it easy, let me know if you do not feel better in the next couple of days- Sooner if worse.

## 2015-06-08 NOTE — Progress Notes (Signed)
[name] [facility] [address], [place], [place] [address] [phone]  Date:  06/08/2015   Name:  Stacey [name]   DOB:  1950/03/28   MRN:  [id number]  PCP:  Cathlean Cower, MD    Chief Complaint: Sore Throat   History of Present Illness:  Stacey [name] is a 65 y.o. very pleasant female patient who presents with the following:  Patient who is new to me here today with illness. She has noted sx of earache, ST, swollen cervical glands, sinus drainage into her upper chest, cough, nausea, body aches, headache.    She does have a pacemaker due to complete heart block Her sx seem to have triggered her vertigo She has not measured a temperature but has felt hot and cold.  She no longer gets hormonal hot flashes.  This morning she planned to go to work "and I had to literally throw myself into bed cause the room started spinning."    She does get vertigo at times. Being sick like this tends to exacerbate her vertigo. She would like a refill of the antivert and zofran that she uses for this  Asked more about diarrhea; if she has coughing or sneezing she will feel sick to her stomach and will need to go to the bathroom- she is NOT having true diarrhea  She notes that coughing makes her chest hurt, her throat hurt and will make her gag.  Otherwise no CP or SOB  Patient Active Problem List   Diagnosis Date Noted  . Acute URI 03/16/2015  . Acute bronchitis 11/14/2014  . Wheezing 11/14/2014  . S/P myomectomy 04/08/2014  . Complete heart block ([place]) 02/26/2014  . LINQ recorder  02/26/2014  . Pacemaker-Medtronic  MRI compatible 02/26/2014  . Nocturnal hypoxemia 01/30/2014  . Pancreatitis 10/22/2013  . Odynophagia 10/22/2013  . Chest pain syndrome 10/21/2013  . Syncope 12/18/2012  . Loop recorder-LINQ 12/18/2012  . Palpitations 09/12/2012  . Midsternal chest pain 09/12/2012  . 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  Abdominal pain, generalized 07/24/2012  . Polyarthralgia 07/24/2012  . Angioedema 03/04/2012  . Varicose veins of lower extremities with other complications 32/95/1884  . Abnormal liver function test 09/07/2011  . 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 Trigeminal neuralgia 08/15/2011  . Vertigo 07/27/2011  . Diabetes ([place]) 08/30/2010  . Encounter for long-term (current) use of high-risk medication 08/30/2010  . Status post myomectomy 06/17/2010  . LBBB (left bundle branch block) 06/17/2010  . Preventative health care 04/16/2010  . Chronic diastolic heart failure ([place]) 04/06/2010  . Diverticulosis of large intestine 04/06/2010  . DIVERTICULOSIS, COLON, WITH HEMORRHAGE 04/06/2010  . RECTAL BLEEDING 04/06/2010  . BRADYCARDIA 10/13/2009  . ALLERGIC RHINITIS 12/07/2006  . VENTRAL HERNIA 12/07/2006  . COLONIC POLYPS, HX OF 12/07/2006  . OBESITY 09/16/2006  . ANXIETY 09/16/2006  . DEPRESSION 09/16/2006  . Hyperlipidemia 08/01/2006  . Sickle cell disease ([place]) 08/01/2006  . Essential hypertension 08/01/2006  . Hypertrophic obstructive cardiomyopathy ([place]) 08/01/2006  . Asthma 08/01/2006    Past Medical History  Diagnosis Date  . HYPERLIPIDEMIA 08/01/2006  . OBESITY 09/16/2006  . SICKLE CELL ANEMIA 08/01/2006  . ANXIETY 09/16/2006  . DEPRESSION 09/16/2006  . HYPERTENSION 08/01/2006  . 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 s/p septal myomectomy 4/12 at [facility] with Dr. Evelina Dun;  b. echo 5/12: EF 60-65%, LVOT peak 18 mmHg; grade 1 diast dysfxn, mild SAM (improved since myomectomy;  c. 03/2012 Echo: EF 60-65%, mod-sev basal septal asymm hypertrophy, basal septal HK, LVOT grad 38mHg, Gr 1 DD, , SAM, Mild MR, nl RV, PASP 373mg; 08/2012 Echo: technically difficult, doubt LVOT obstruction, EF 60%, Gr 1 DD, mod-sev dil LA.  . Marland KitchenSTHMA 08/01/2006  . VENTRAL HERNIA 12/07/2006  . ANGIOEDEMA 03/07/2008    a. with ACE-I  . DIVERTICULOSIS, COLON, WITH HEMORRHAGE 04/06/2010  . CAD (coronary  artery disease)     a. 01/2010 : Minimal plaque at cardiac catheterization - CFX 20%, EF 70%;  b. 08/2012 Cath: Nl Cors, EF 65%.  . Marland KitchenBBB (left bundle branch block) 06/17/2010  . Chronic diastolic CHF (congestive heart failure) (HCAvenel3/13/2012    a. In setting of HOCM.  . Marland Kitchenmpaired glucose tolerance 06/25/2010  . DM (diabetes mellitus) in pregnancy, delivered w/postpartum condition 08/30/2010  . Type II or unspecified type diabetes mellitus without mention of complication, uncontrolled 08/30/2010  . COPD (chronic obstructive pulmonary disease) (HCSouth [place]  . Renal artery aneurysm (HCEdwards[facility]  . Complete heart block (HCBrackettville2/03/2014    a. s/p MDT dual chamber pacemaker 02/2014    Past Surgical History  Procedure Laterality Date  . Abdominal hysterectomy  1987  . Ventral hernia repair  06/2006  . Myomectomy      Septal  . Left heart cath  Aug. 18, 2014    Medtronic heart device  . Cardiac surgery    . Left heart catheterization with coronary angiogram N/A 09/10/2012    Procedure: LEFT HEART CATHETERIZATION WITH CORONARY ANGIOGRAM;  Surgeon: ChBurnell BlanksMD;  [facility];  Service: Cardiovascular;  Laterality: N/A;  . Electrophysiology study N/A 09/12/2012    Procedure: ELECTROPHYSIOLOGY STUDY;  Surgeon: StDeboraha SprangMD;  [facility];  Service: Cardiovascular;  Laterality: N/A;  . Permanent pacemaker insertion N/A 02/26/2014    MDT Advisa dual chamber pacemaker implanted by Dr KlCaryl Comesor heart block    Social History  Substance Use Topics  . Smoking status: Former Smoker -- 0.25 packs/day for 20 years    Types: Cigarettes    Quit date: 01/24/1998  . Smokeless tobacco: Never Used     Comment: Quit smoking 2001. Smoked on and off for 20 years. Smoked 2-3 cigars daily  . Alcohol Use: No    Family History  Problem Relation Age of Onset  . Colon cancer Daughter     and bleeding disorder  . Hyperlipidemia Daughter   . Heart disease Daughter   . Cancer Daughter   . Heart  disease Father   . Heart attack Father   . Hypertension Father     before age 64. Heart attack Sister   . Hyperlipidemia Sister   . Stroke Sister   . Hypertension Sister   . Hyperlipidemia Brother   . Hypertension Brother   . Diabetes Paternal Grandmother   . Clotting disorder Daughter   . Clotting disorder Other     granddaughter  . Hypertension Son     Allergies  Allergen Reactions  . Dairy Aid [Lactase] Diarrhea and Other (See Comments)    Bloating and gastric distress  . Ace Inhibitors Other (See Comments)    REACTION: angioedema right eye  . Aspirin Other (See Comments)    Bleeding   . Codeine Other (See Comments)    Hallucinations, can take if with someone.  . Soy Allergy Other (See Comments)    Bleeding & cramps    Medication list has been reviewed  and updated.  Current Outpatient Prescriptions on File Prior to Visit  Medication Sig Dispense Refill  . albuterol (PROVENTIL HFA;VENTOLIN HFA) 108 (90 BASE) MCG/ACT inhaler Inhale 2 puffs into the lungs every 6 (six) hours as needed for wheezing or shortness of breath. 18 g 5  . amLODipine (NORVASC) 10 MG tablet TAKE 1 TABLET BY MOUTH EVERY DAY 90 tablet 1  . atorvastatin (LIPITOR) 10 MG tablet Take 10 mg by mouth daily.    . Blood Glucose Monitoring Suppl (ONE TOUCH ULTRA 2) W/DEVICE KIT Use to check blood sugars daily Dx E11.9 1 each 0  . cetirizine (ZYRTEC) 10 MG tablet Take 10 mg by mouth daily.      . fenofibrate (TRICOR) 145 MG tablet TAKE 1 TABLET BY MOUTH DAILY 90 tablet 0  . fluticasone (FLONASE) 50 MCG/ACT nasal spray Place 2 sprays into the nose daily. (Patient taking differently: Place 1 spray into the nose daily as needed for allergies. ) 16 g 6  . glucose blood test strip 1 each by Other route daily. Use to check blood sugar daily Dx e11.9 100 each 11  . irbesartan (AVAPRO) 300 MG tablet TAKE 1 TABLET(300 MG) BY MOUTH AT BEDTIME 90 tablet 1  . meclizine (ANTIVERT) 12.5 MG tablet Take 1 tablet (12.5 mg  total) by mouth 3 (three) times daily as needed for dizziness. 40 tablet 1  . metoprolol succinate (TOPROL-XL) 100 MG 24 hr tablet TAKE 1 TABLET BY MOUTH EVERY DAY 90 tablet 1  . ONETOUCH DELICA LANCETS 06C MISC USE TO HELP CHECK BLOOD SUGAR DAILY 200 each 0  . oxcarbazepine (TRILEPTAL) 600 MG tablet Take 600 mg by mouth 2 (two) times daily.    . potassium chloride (K-DUR) 10 MEQ tablet Take 1 tablet (10 mEq total) by mouth 2 (two) times daily. 90 tablet 11  . topiramate (TOPAMAX) 100 MG tablet Take 100 mg by mouth daily.   0   No current facility-administered medications on file prior to visit.    Review of Systems:  As per HPI- otherwise negative.   Physical Examination: Filed Vitals:   06/08/15 1657  BP: 160/86  Pulse: 67  Temp: 99.8 F (37.7 C)   Filed Vitals:   06/08/15 1657  Height: 5' 9" (1.753 m)  Weight: 174 lb 3.2 oz (79.017 kg)   Body mass index is 25.71 kg/(m^2). Ideal Body Weight: Weight in (lb) to have BMI = 25: 168.9  GEN: WDWN, NAD, Non-toxic, A & O x 3, looks well   HEENT: Atraumatic, Normocephalic. Neck supple. No masses, No LAD.  Bilateral TM wnl, oropharynx normal.  PEERL,EOMI.   Ears and Nose: No external deformity. CV: RRR, 2/6 systolic murmur.  Pacemaker in place.  No JVD. No thrill. No extra heart sounds. PULM: CTA B, no wheezes, crackles, rhonchi. No retractions. No resp. distress. No accessory muscle use. ABD: S, NT, ND. No rebound. No HSM. EXTR: No c/c/e NEURO Normal gait. No evidence of imbalance.  Using all limbs normally PSYCH: Normally interactive. Conversant. Not depressed or anxious appearing.  Calm demeanor.    Assessment and Plan: Acute bronchitis, unspecified organism - Plan: doxycycline (VIBRAMYCIN) 100 MG capsule  Sore throat - Plan: POCT rapid strep A  Benign paroxysmal positional vertigo, unspecified laterality - Plan: ondansetron (ZOFRAN) 4 MG tablet, meclizine (ANTIVERT) 12.5 MG tablet  Treat for bronchitis with  doxycycline Refilled her zofran and antivert today for recurrent BPPV vertigo We will treat you with doxycycline for bronchitis, and I refilled your  medications for vertigo. Try to take it easy, let me know if you do not feel better in the next couple of days- Sooner if worse.   Signed Lamar Blinks, MD

## 2015-06-08 NOTE — Progress Notes (Signed)
Pre visit review using our clinic review tool, if applicable. No additional management support is needed unless otherwise documented below in the visit note. 

## 2015-06-26 ENCOUNTER — Ambulatory Visit (INDEPENDENT_AMBULATORY_CARE_PROVIDER_SITE_OTHER): Payer: BC Managed Care – PPO | Admitting: Internal Medicine

## 2015-06-26 ENCOUNTER — Encounter: Payer: Self-pay | Admitting: Internal Medicine

## 2015-06-26 VITALS — BP 164/86 | HR 72 | Ht 69.0 in | Wt 179.6 lb

## 2015-06-26 DIAGNOSIS — I442 Atrioventricular block, complete: Secondary | ICD-10-CM

## 2015-06-26 DIAGNOSIS — I421 Obstructive hypertrophic cardiomyopathy: Secondary | ICD-10-CM

## 2015-06-26 DIAGNOSIS — I447 Left bundle-branch block, unspecified: Secondary | ICD-10-CM | POA: Diagnosis not present

## 2015-06-26 DIAGNOSIS — Z950 Presence of cardiac pacemaker: Secondary | ICD-10-CM

## 2015-06-26 NOTE — Patient Instructions (Signed)
Medication Instructions: - Your physician recommends that you continue on your current medications as directed. Please refer to the Current Medication list given to you today.  Labwork: - none  Procedures/Testing: - none  Follow-Up: - Remote monitoring is used to monitor your Pacemaker of ICD from home. This monitoring reduces the number of office visits required to check your device to one time per year. It allows us to keep an eye on the functioning of your device to ensure it is working properly. You are scheduled for a device check from home on 09/29/15. You may send your transmission at any time that day. If you have a wireless device, the transmission will be sent automatically. After your physician reviews your transmission, you will receive a postcard with your next transmission date.  - Your physician wants you to follow-up in: 1 year with Dr. Klein. You will receive a reminder letter in the mail two months in advance. If you don't receive a letter, please call our office to schedule the follow-up appointment.  Any Additional Special Instructions Will Be Listed Below (If Applicable).     If you need a refill on your cardiac medications before your next appointment, please call your pharmacy.   

## 2015-06-26 NOTE — Progress Notes (Signed)
Patient Care Team: Biagio Borg, MD as PCP - General (Internal Medicine) Deboraha Sprang, MD as Consulting Physician (Cardiology) Gatha Mayer, MD as Consulting Physician (Gastroenterology)   HPI  Stacey [name] is a 65 y.o. female Seen in followup for palpitations and near syncope in the setting of hypertrophic cardiomyopathy with prior myectomy;  loop recorder demonstrated intermittent complete heart block and she underwent pacing 1/16.   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 She did get invited to a barbecue.     Past Medical History  Diagnosis Date  . HYPERLIPIDEMIA 08/01/2006  . OBESITY 09/16/2006  . SICKLE CELL ANEMIA 08/01/2006  . ANXIETY 09/16/2006  . DEPRESSION 09/16/2006  . HYPERTENSION 08/01/2006  . 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 s/p septal myomectomy 4/12 at [facility] with Dr. Evelina Dun;  b. echo 5/12: EF 60-65%, LVOT peak 18 mmHg; grade 1 diast dysfxn, mild SAM (improved since myomectomy;  c. 03/2012 Echo: EF 60-65%, mod-sev basal septal asymm hypertrophy, basal septal HK, LVOT grad 108mHg, Gr 1 DD, , SAM, Mild MR, nl RV, PASP 326mg; 08/2012 Echo: technically difficult, doubt LVOT obstruction, EF 60%, Gr 1 DD, mod-sev dil LA.  . Marland KitchenSTHMA 08/01/2006  . VENTRAL HERNIA 12/07/2006  . ANGIOEDEMA 03/07/2008    a. with ACE-I  . DIVERTICULOSIS, COLON, WITH HEMORRHAGE 04/06/2010  . CAD (coronary artery disease)     a. 01/2010 : Minimal plaque at cardiac catheterization - CFX 20%, EF 70%;  b. 08/2012 Cath: Nl Cors, EF 65%.  . Marland KitchenBBB (left bundle branch block) 06/17/2010  . Chronic diastolic CHF (congestive heart failure) (HCAshland3/13/2012    a. In setting of HOCM.  . Marland Kitchenmpaired glucose tolerance 06/25/2010  . DM (diabetes mellitus) in pregnancy, delivered w/postpartum condition 08/30/2010  . Type II or unspecified type diabetes mellitus without mention of complication, uncontrolled 08/30/2010  . COPD  (chronic obstructive pulmonary disease) (HCArnold  . Renal artery aneurysm (HCNew Braunf[place]  . Complete heart block (HCWortham2/03/2014    a. s/p MDT dual chamber pacemaker 02/2014    Past Surgical History  Procedure Laterality Date  . Abdominal hysterectomy  1987  . Ventral hernia repair  06/2006  . Myomectomy      Septal  . Left heart cath  Aug. 18, 2014    Medtronic heart device  . Cardiac surgery    . Left heart catheterization with coronary angiogram N/A 09/10/2012    Procedure: LEFT HEART CATHETERIZATION WITH CORONARY ANGIOGRAM;  Surgeon: ChBurnell BlanksMD;  [facility];  Service: Cardiovascular;  Laterality: N/A;  . Electrophysiology study N/A 09/12/2012    Procedure: ELECTROPHYSIOLOGY STUDY;  Surgeon: StDeboraha SprangMD;  [facility];  Service: Cardiovascular;  Laterality: N/A;  . Permanent pacemaker insertion N/A 02/26/2014    MDT Advisa dual chamber pacemaker implanted by Dr KlCaryl Comesor heart block    Current Outpatient Prescriptions  Medication Sig Dispense Refill  . albuterol (PROVENTIL HFA;VENTOLIN HFA) 108 (90 BASE) MCG/ACT inhaler Inhale 2 puffs into the lungs every 6 (six) hours as needed for wheezing or shortness of breath. 18 g 5  . amLODipine (NORVASC) 10 MG tablet TAKE 1 TABLET BY MOUTH EVERY DAY 90 tablet 1  . atorvastatin (LIPITOR) 10 MG tablet Take 10 mg by mouth daily.    . Blood Glucose Monitoring Suppl (ONE TOUCH ULTRA 2) W/DEVICE KIT Use to check  blood sugars daily Dx E11.9 1 each 0  . cetirizine (ZYRTEC) 10 MG tablet Take 10 mg by mouth daily.      . fenofibrate (TRICOR) 145 MG tablet TAKE 1 TABLET BY MOUTH DAILY 90 tablet 0  . fluticasone (FLONASE) 50 MCG/ACT nasal spray Place 1 spray into both nostrils daily.    . fluticasone (FLONASE) 50 MCG/ACT nasal spray Place 1 spray into both nostrils as directed.    Marland Kitchen glucose blood test strip 1 each by Other route daily. Use to check blood sugar daily Dx e11.9 100 each 11  . irbesartan (AVAPRO) 300 MG tablet  TAKE 1 TABLET(300 MG) BY MOUTH AT BEDTIME 90 tablet 1  . meclizine (ANTIVERT) 12.5 MG tablet Take 1 tablet (12.5 mg total) by mouth 3 (three) times daily as needed for dizziness. 40 tablet 1  . metoprolol succinate (TOPROL-XL) 100 MG 24 hr tablet TAKE 1 TABLET BY MOUTH EVERY DAY 90 tablet 1  . ondansetron (ZOFRAN) 4 MG tablet Take 1 tablet (4 mg total) by mouth every 8 (eight) hours as needed for nausea or vomiting. As needed for nausea or vomiting. 20 tablet 1  . ONETOUCH DELICA LANCETS 82N MISC USE TO HELP CHECK BLOOD SUGAR DAILY 200 each 0  . oxcarbazepine (TRILEPTAL) 600 MG tablet Take 600 mg by mouth 2 (two) times daily.    . potassium chloride (K-DUR) 10 MEQ tablet Take 1 tablet (10 mEq total) by mouth 2 (two) times daily. 90 tablet 11  . topiramate (TOPAMAX) 100 MG tablet Take 100 mg by mouth daily.   0   No current facility-administered medications for this visit.    Allergies  Allergen Reactions  . Dairy Aid [Lactase] Diarrhea and Other (See Comments)    Bloating and gastric distress  . Ace Inhibitors Other (See Comments)    REACTION: angioedema right eye  . Aspirin Other (See Comments)    Bleeding   . Codeine Other (See Comments)    Hallucinations, can take if with someone.  . Soy Allergy Other (See Comments)    Bleeding & cramps    Review of Systems negative except from HPI and PMH  Physical Exam BP 164/86 mmHg  Pulse 72  Ht '5\' 9"'$  (1.753 m)  Wt 179 lb 9.6 oz (81.466 kg)  BMI 26.51 kg/m2 Well developed and nourished in no acute distress HENT normal Neck supple with JVP-flat Clear sternotomnoy Regular rate and rhythm,2/6 murmur Abd-soft with active BS No Clubbing cyanosis edema Skin-warm and dry A & Oriented  Grossly normal sensory and motor function  Loop recorder interrogation demonstrates no intercurrent recorded events  ECG demonstrates sinus rhythm at72 Intervals 18/16/45   Assessment and  Plan  HCM  S/p myectomy  Left bundle branch  block  Intermittent complete heart block  Pacemaker-St. Jude  Clinically stable. We will plan to see her again in one year's time     lot of stress at school

## 2015-07-21 ENCOUNTER — Other Ambulatory Visit: Payer: Self-pay | Admitting: Internal Medicine

## 2015-07-23 ENCOUNTER — Other Ambulatory Visit: Payer: Self-pay | Admitting: Internal Medicine

## 2015-08-20 IMAGING — CT CT ABD-PELV W/ CM
2 of 5 series · 15 of 46 positions shown, 17 images · IV contrast (Omni 300)
Comparison: CT 11/21/2011.

CLINICAL DATA: Pain.

EXAM:
CT ABDOMEN AND PELVIS WITH CONTRAST
TECHNIQUE: Multidetector CT imaging of the abdomen and pelvis was performed
using the standard protocol following bolus administration of
intravenous contrast.
CONTRAST:  100mL OMNIPAQUE IOHEXOL 300 MG/ML  SOLN

[Series 2: abd/ pelvis 5.0 i30f 1 · axial · 0.70mm/px · z∈[+930,+1390]mm · 12 of 104 slices shown, 14 images]
[im 6/104  soft-tissue]
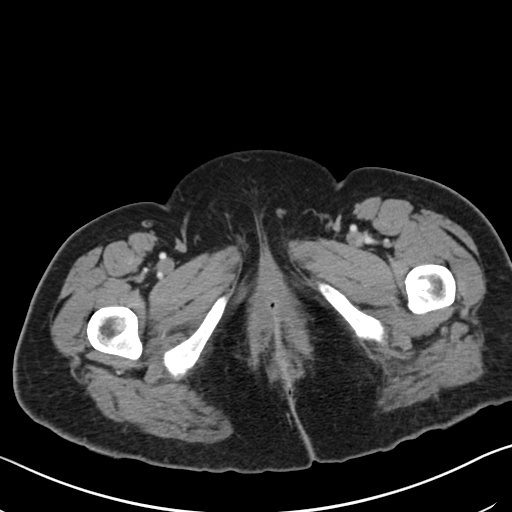
[im 6/104  bone]
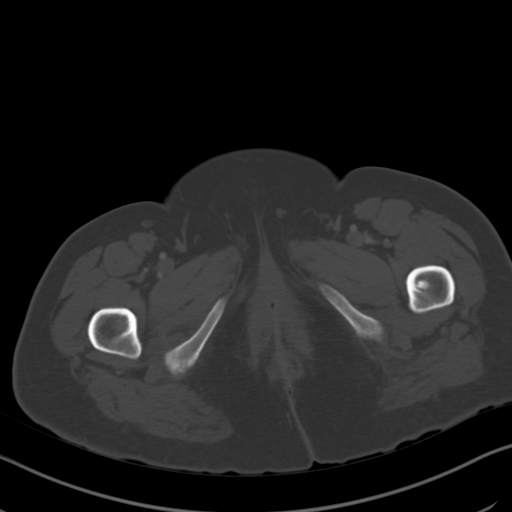
[im 18/104  soft-tissue]
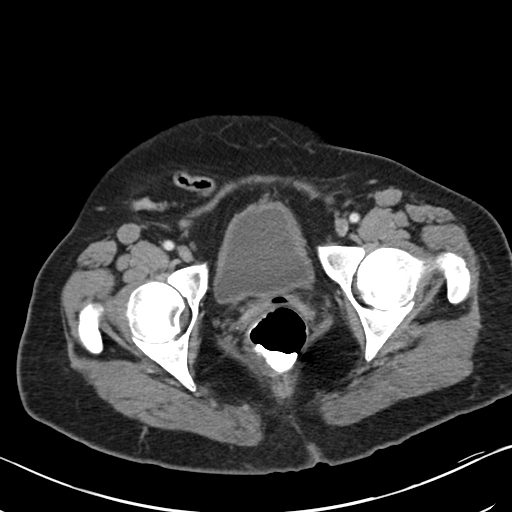
[im 23/104  soft-tissue]
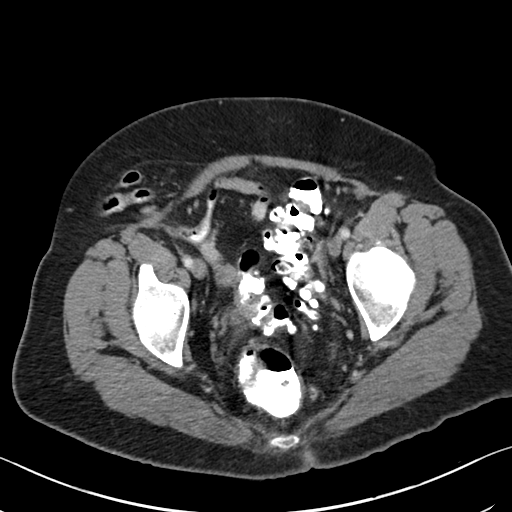
[im 29/104  soft-tissue]
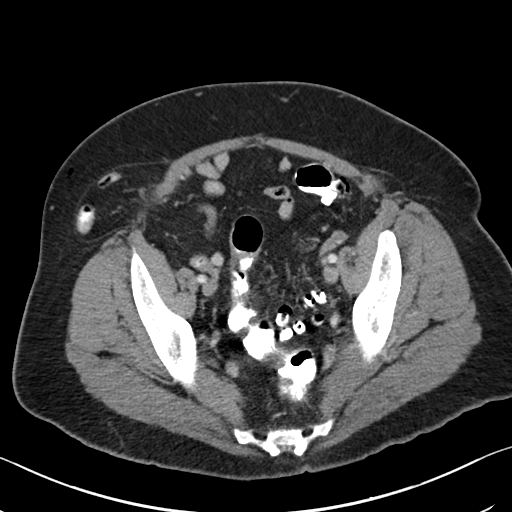
[im 41/104  soft-tissue]
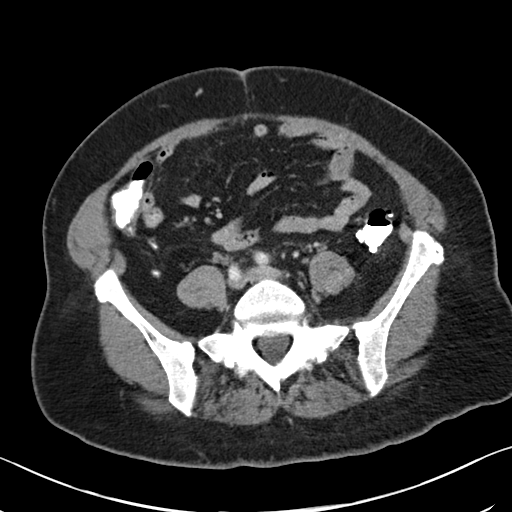
[im 46/104  soft-tissue]
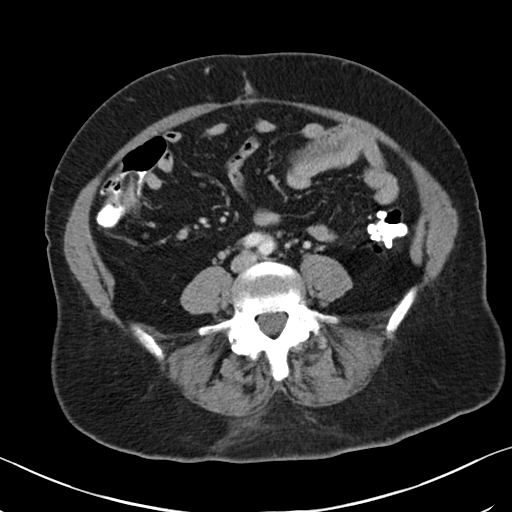
[im 58/104  soft-tissue]
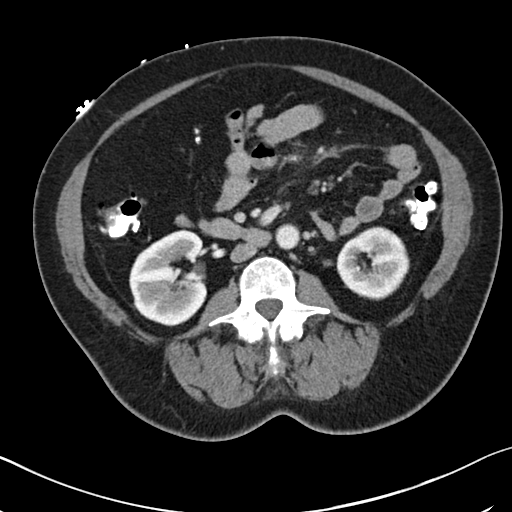
[im 63/104  soft-tissue]
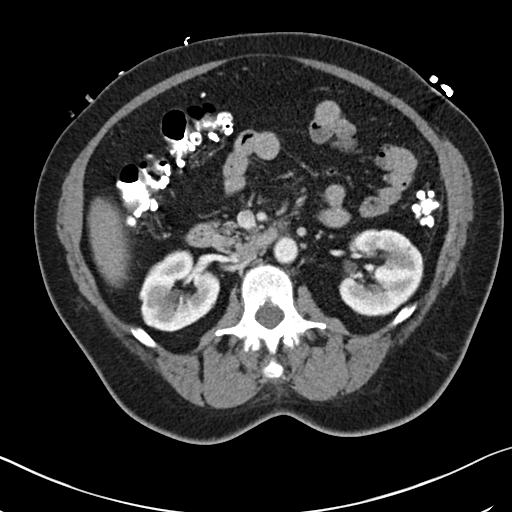
[im 75/104  soft-tissue]
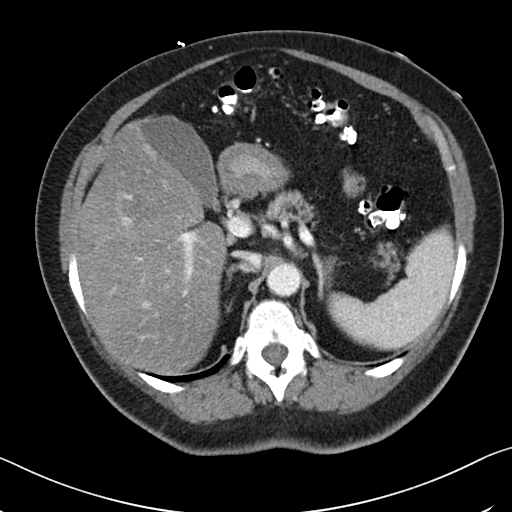
[im 75/104  bone]
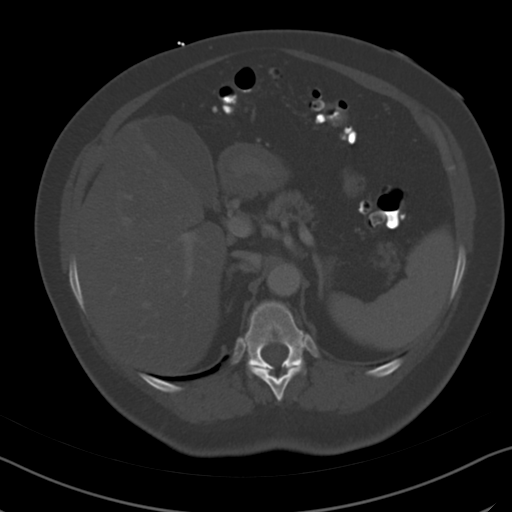
[im 81/104  soft-tissue]
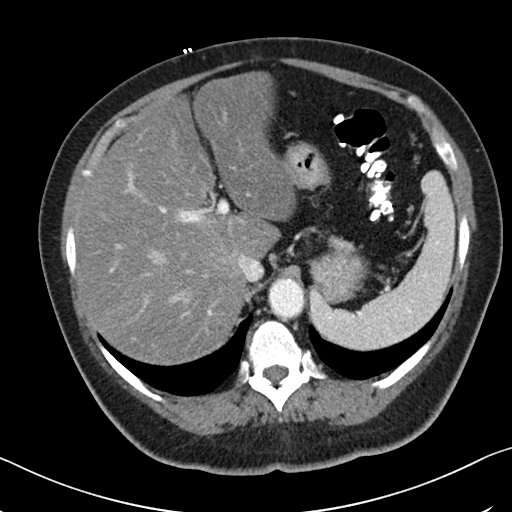
[im 86/104  soft-tissue]
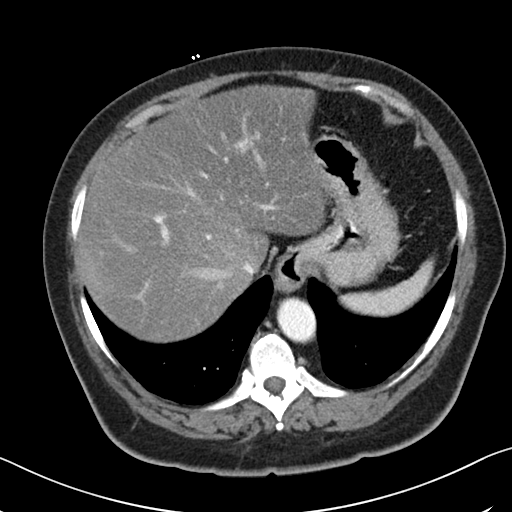
[im 98/104  soft-tissue]
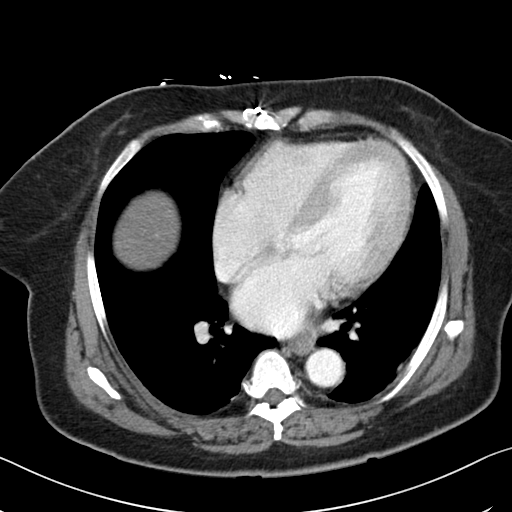

[Series 4: coronals · coronal · 0.74mm/px · 3 of 132 slices shown]
[im 44/132  soft-tissue]
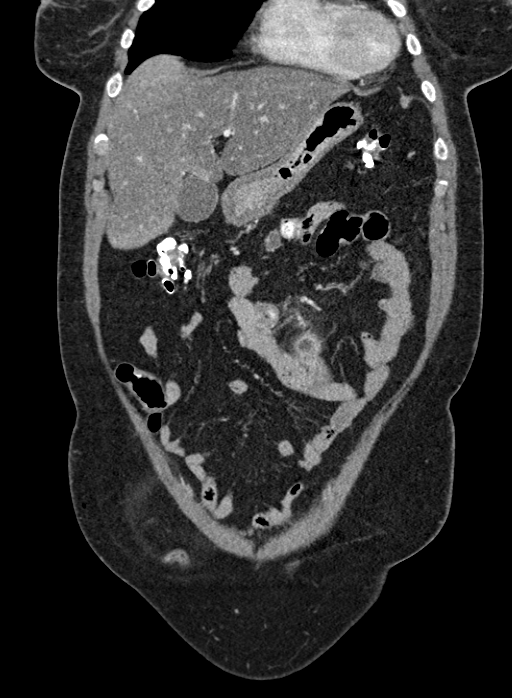
[im 59/132  soft-tissue]
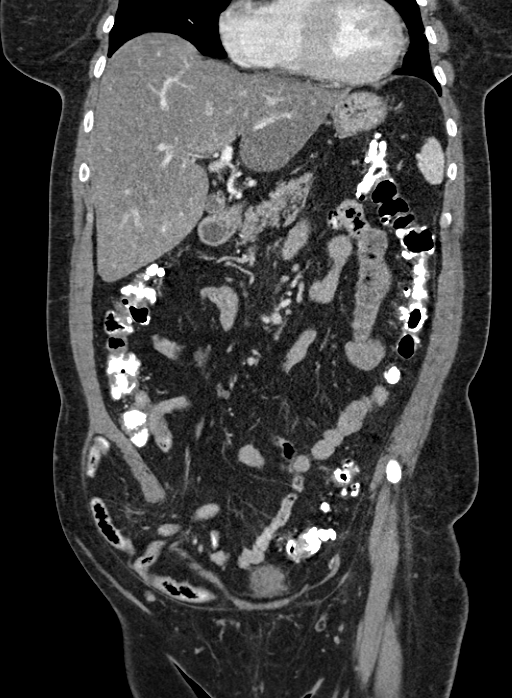
[im 73/132  soft-tissue]
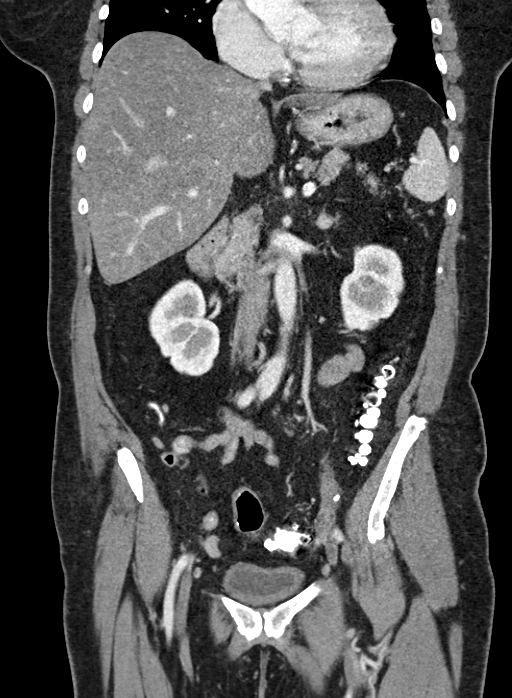

[15 of 46 positions shown; findings below may reference images not displayed]

FINDINGS: Diffuse fatty infiltration of the liver. Spleen normal. Splenosis.
Pancreas unremarkable. No biliary distention. Rounded density over
the dome of the gallbladder is most likely volume averaging with
adjacent liver.

Adrenals normal. Kidneys normal. There is a 12 mm right renal artery
calcified aneurysm which appears less prominent than prior exam. No
hydronephrosis or obstructing ureteral stone. The bladder is
nondistended. Mild bladder wall thickening is present, this may be
from nondistended state. Bladder wall pathology cannot be excluded.
Hysterectomy. No free pelvic fluid.

No adenopathy. Aortoiliac atherosclerotic vascular disease. Visceral
vessels are patent. Portal vein is patent.

Appendix normal. Sigmoid colonic diverticulosis noted. Very mild
adjacent mesenteric fat streaking is present. Mild diverticulitis
cannot be excluded. No evidence of obstruction. No evidence of
abscess. Scattered diverticula are noted throughout the remaining of
the colon. No small bowel distention. The stomach is nondistended.
No free air. Right lateral lower abdominal wall hernia adjacent to
the rectus muscle noted. Herniation of small bowel loops. No
evidence of strangulation, bowel wall edema, pneumatosis, or bowel
obstruction. Small inguinal hernias with herniation of fat.

Basilar atelectasis and/or scarring. Cardiomegaly. No acute bony
abnormality. Degenerative changes lumbar spine and both hips. Stable
calcified density left proximal femur consistent with bone island.
IMPRESSION: 1. Diffuse fatty infiltration of liver.
2. 12 mm right renal artery calcified aneurysm. The aneurysm appears
slightly smaller on today's exam compared to prior CT of 11/21/2011
.
[DATE]. Mild bladder wall thickening. Bladder wall pathology including
cystitis cannot be excluded.
4. Severe sigmoid colonic diverticulosis. Very mild sigmoid colonic
diverticulitis cannot be excluded.
5. Right lateral lower abdominal wall hernia adjacent to the rectus
muscle. This herniation small bowel. No evidence of obstruction or
strangulation.

## 2015-09-04 ENCOUNTER — Ambulatory Visit (INDEPENDENT_AMBULATORY_CARE_PROVIDER_SITE_OTHER): Payer: BC Managed Care – PPO | Admitting: Internal Medicine

## 2015-09-04 ENCOUNTER — Other Ambulatory Visit (INDEPENDENT_AMBULATORY_CARE_PROVIDER_SITE_OTHER): Payer: BC Managed Care – PPO

## 2015-09-04 ENCOUNTER — Encounter: Payer: Self-pay | Admitting: Internal Medicine

## 2015-09-04 VITALS — BP 140/82 | HR 90 | Temp 98.4°F | Resp 20 | Wt 175.0 lb

## 2015-09-04 DIAGNOSIS — E119 Type 2 diabetes mellitus without complications: Secondary | ICD-10-CM | POA: Diagnosis not present

## 2015-09-04 DIAGNOSIS — Q998 Other specified chromosome abnormalities: Secondary | ICD-10-CM | POA: Diagnosis not present

## 2015-09-04 DIAGNOSIS — Z0000 Encounter for general adult medical examination without abnormal findings: Secondary | ICD-10-CM | POA: Insufficient documentation

## 2015-09-04 DIAGNOSIS — Z0001 Encounter for general adult medical examination with abnormal findings: Secondary | ICD-10-CM

## 2015-09-04 DIAGNOSIS — R1903 Right lower quadrant abdominal swelling, mass and lump: Secondary | ICD-10-CM

## 2015-09-04 DIAGNOSIS — R7989 Other specified abnormal findings of blood chemistry: Secondary | ICD-10-CM | POA: Diagnosis not present

## 2015-09-04 DIAGNOSIS — R6889 Other general symptoms and signs: Secondary | ICD-10-CM | POA: Diagnosis not present

## 2015-09-04 DIAGNOSIS — E785 Hyperlipidemia, unspecified: Secondary | ICD-10-CM

## 2015-09-04 DIAGNOSIS — I10 Essential (primary) hypertension: Secondary | ICD-10-CM | POA: Diagnosis not present

## 2015-09-04 DIAGNOSIS — Q738 Other reduction defects of unspecified limb(s): Secondary | ICD-10-CM

## 2015-09-04 LAB — URINALYSIS, ROUTINE W REFLEX MICROSCOPIC
Bilirubin Urine: NEGATIVE
Hgb urine dipstick: NEGATIVE
Leukocytes, UA: NEGATIVE
Nitrite: NEGATIVE
Specific Gravity, Urine: 1.025 (ref 1.000–1.030)
Total Protein, Urine: 100 — AB
Urine Glucose: 100 — AB
Urobilinogen, UA: 0.2 (ref 0.0–1.0)
pH: 5.5 (ref 5.0–8.0)

## 2015-09-04 LAB — BASIC METABOLIC PANEL
BUN: 11 mg/dL (ref 6–23)
CO2: 27 mEq/L (ref 19–32)
Calcium: 9.9 mg/dL (ref 8.4–10.5)
Chloride: 104 mEq/L (ref 96–112)
Creatinine, Ser: 0.81 mg/dL (ref 0.40–1.20)
GFR: 91.52 mL/min (ref >60.00)
Glucose, Bld: 220 mg/dL — ABNORMAL HIGH (ref 70–99)
Potassium: 3.8 mEq/L (ref 3.5–5.1)
Sodium: 138 mEq/L (ref 135–145)

## 2015-09-04 LAB — CBC WITH DIFFERENTIAL/PLATELET
Basophils Absolute: 0 10*3/uL (ref 0.0–0.1)
Basophils Relative: 0.5 % (ref 0.0–3.0)
Eosinophils Absolute: 0.1 10*3/uL (ref 0.0–0.7)
Eosinophils Relative: 1.2 % (ref 0.0–5.0)
HCT: 39.6 % (ref 36.0–46.0)
Hemoglobin: 13.1 g/dL (ref 12.0–15.0)
Lymphocytes Relative: 28.2 % (ref 12.0–46.0)
Lymphs Abs: 1.6 10*3/uL (ref 0.7–4.0)
MCHC: 33.2 g/dL (ref 30.0–36.0)
MCV: 75.8 fl — ABNORMAL LOW (ref 78.0–100.0)
Monocytes Absolute: 0.3 10*3/uL (ref 0.1–1.0)
Monocytes Relative: 6 % (ref 3.0–12.0)
Neutro Abs: 3.6 10*3/uL (ref 1.4–7.7)
Neutrophils Relative %: 64.1 % (ref 43.0–77.0)
Platelets: 227 10*3/uL (ref 150.0–400.0)
RBC: 5.23 Mil/uL — ABNORMAL HIGH (ref 3.87–5.11)
RDW: 15.9 % — ABNORMAL HIGH (ref 11.5–15.5)
WBC: 5.7 10*3/uL (ref 4.0–10.5)

## 2015-09-04 LAB — MICROALBUMIN / CREATININE URINE RATIO
Creatinine,U: 182 mg/dL
Microalb Creat Ratio: 48.1 mg/g — ABNORMAL HIGH (ref 0.0–30.0)
Microalb, Ur: 87.5 mg/dL — ABNORMAL HIGH (ref 0.0–1.9)

## 2015-09-04 LAB — LDL CHOLESTEROL, DIRECT: Direct LDL: 131 mg/dL

## 2015-09-04 LAB — LIPID PANEL
Cholesterol: 289 mg/dL — ABNORMAL HIGH (ref 0–200)
HDL: 42.6 mg/dL (ref >39.00)
Total CHOL/HDL Ratio: 7
Triglycerides: 654 mg/dL — ABNORMAL HIGH (ref 0.0–149.0)

## 2015-09-04 LAB — HEMOGLOBIN A1C
Hgb A1c MFr Bld: 9.9 % — ABNORMAL HIGH (ref <5.7)
Mean Plasma Glucose: 237 mg/dL

## 2015-09-04 LAB — TSH: TSH: 0.69 u[IU]/mL (ref 0.35–4.50)

## 2015-09-04 LAB — HEPATIC FUNCTION PANEL
ALT: 22 U/L (ref 0–35)
AST: 18 U/L (ref 0–37)
Albumin: 4.3 g/dL (ref 3.5–5.2)
Alkaline Phosphatase: 119 U/L — ABNORMAL HIGH (ref 39–117)
Bilirubin, Direct: 0.1 mg/dL (ref 0.0–0.3)
Total Bilirubin: 0.4 mg/dL (ref 0.2–1.2)
Total Protein: 7.4 g/dL (ref 6.0–8.3)

## 2015-09-04 MED ORDER — ATORVASTATIN CALCIUM 20 MG PO TABS: 20.0000 mg | ORAL_TABLET | Freq: Every day | ORAL | 3 refills | Status: DC

## 2015-09-04 NOTE — Progress Notes (Signed)
Subjective:    Patient ID: Stacey [name], female    DOB: 03-27-1950, 65 y.o.   MRN: [id number]  HPI  Here for wellness and f/u;  Overall doing ok;  Pt denies Chest pain, worsening SOB, DOE, wheezing, orthopnea, PND, worsening LE edema, palpitations, dizziness or syncope.  Pt denies neurological change such as new headache, facial or extremity weakness.   Pt states overall good compliance with treatment and medications, good tolerability, and has been trying to follow appropriate diet.  Pt denies worsening depressive symptoms, suicidal ideation or panic. No fever, night sweats, wt loss, loss of appetite, or other constitutional symptoms.  Pt states good ability with ADL's, has low fall risk, home safety reviewed and adequate, no other significant changes in hearing or vision, and only occasionally active with exercise.     Pt denies polydipsia, polyuria, or low sugar symptoms such as weakness or confusion improved with po intake.  Pt states overall good compliance with meds, trying to follow lower cholesterol, diabetic diet, wt overall stable but little exercise however.   Also with 1 mo worsening swelling without pain to the rlq - ? Hernia.  Denies worsening reflux, abd pain, dysphagia, n/v, bowel change or blood.  Denies urinary symptoms such as dysuria, frequency, urgency, flank pain, hematuria or n/v, fever, chills. Past Medical History:  Diagnosis Date  . ANGIOEDEMA 03/07/2008   a. with ACE-I  . ANXIETY 09/16/2006  . ASTHMA 08/01/2006  . CAD (coronary artery disease)    a. 01/2010 : Minimal plaque at cardiac catheterization - CFX 20%, EF 70%;  b. 08/2012 Cath: Nl Cors, EF 65%.  . Chronic diastolic CHF (congestive heart failure) ([place]) 04/06/2010   a. In setting of HOCM.  Marland Kitchen Complete heart block ([place]) 02/26/2014   a. s/p MDT dual chamber pacemaker 02/2014  . COPD (chronic obstructive pulmonary disease) ([place])   . DEPRESSION 09/16/2006  . DIVERTICULOSIS, COLON, WITH HEMORRHAGE 04/06/2010  .  DM (diabetes mellitus) in pregnancy, delivered w/postpartum condition 08/30/2010  . HYPERLIPIDEMIA 08/01/2006  . HYPERTENSION 08/01/2006  . Hypertrophic obstructive cardiomyopathy 08/01/2006   a. s/p septal myomectomy 4/12 at [place] with Dr. Evelina Dun;  b. echo 5/12: EF 60-65%, LVOT peak 18 mmHg; grade 1 diast dysfxn, mild SAM (improved since myomectomy;  c. 03/2012 Echo: EF 60-65%, mod-sev basal septal asymm hypertrophy, basal septal HK, LVOT grad 33mHg, Gr 1 DD, , SAM, Mild MR, nl RV, PASP 372mg; 08/2012 Echo: technically difficult, doubt LVOT obstruction, EF 60%, Gr 1 DD, mod-sev dil LA.  . Impaired glucose tolerance 06/25/2010  . LBBB (left bundle branch block) 06/17/2010  . OBESITY 09/16/2006  . [place]  . SICKLE CELL ANEMIA 08/01/2006  . Type II or unspecified type diabetes mellitus without mention of complication, uncontrolled 08/30/2010  . VENTRAL HERNIA 12/07/2006   Past Surgical History:  Procedure Laterality Date  . ABDOMINAL HYSTERECTOMY  1987  . CARDIAC SURGERY    . ELECTROPHYSIOLOGY STUDY N/A 09/12/2012   Procedure: ELECTROPHYSIOLOGY STUDY;  Surgeon: StDeboraha SprangMD;  [facility];  Service: Cardiovascular;  Laterality: N/A;  . LEFT HEART CATH  Aug. 18, 2014   Medtronic heart device  . LEFT HEART CATHETERIZATION WITH CORONARY ANGIOGRAM N/A 09/10/2012   Procedure: LEFT HEART CATHETERIZATION WITH CORONARY ANGIOGRAM;  Surgeon: ChBurnell BlanksMD;  [facility];  Service: Cardiovascular;  Laterality: N/A;  . MYOMECTOMY     Septal  . PERMANENT PACEMAKER INSERTION N/A 02/26/2014   MDT  Advisa dual chamber pacemaker implanted by Dr Caryl Comes for heart block  . VENTRAL HERNIA REPAIR  06/2006    reports that she quit smoking about 17 years ago. Her smoking use included Cigarettes. She has a 5.00 pack-year smoking history. She has never used smokeless tobacco. She reports that she does not drink alcohol or use drugs. family history includes Cancer in her daughter;  Clotting disorder in her daughter and other; Colon cancer in her daughter; Diabetes in her paternal grandmother; Heart attack in her father and sister; Heart disease in her daughter and father; Hyperlipidemia in her brother, daughter, and sister; Hypertension in her brother, father, sister, and son; Stroke in her sister. Allergies  Allergen Reactions  . Dairy Aid [Lactase] Diarrhea and Other (See Comments)    Bloating and gastric distress  . Ace Inhibitors Other (See Comments)    REACTION: angioedema right eye  . Aspirin Other (See Comments)    Bleeding   . Codeine Other (See Comments)    Hallucinations, can take if with someone.  . Soy Allergy Other (See Comments)    Bleeding & cramps   Current Outpatient Prescriptions on File Prior to Visit  Medication Sig Dispense Refill  . albuterol (PROVENTIL HFA;VENTOLIN HFA) 108 (90 BASE) MCG/ACT inhaler Inhale 2 puffs into the lungs every 6 (six) hours as needed for wheezing or shortness of breath. 18 g 5  . amLODipine (NORVASC) 10 MG tablet TAKE 1 TABLET BY MOUTH EVERY DAY 90 tablet 1  . amLODipine (NORVASC) 10 MG tablet TAKE 1 TABLET BY MOUTH EVERY DAY 90 tablet 0  . Blood Glucose Monitoring Suppl (ONE TOUCH ULTRA 2) W/DEVICE KIT Use to check blood sugars daily Dx E11.9 1 each 0  . cetirizine (ZYRTEC) 10 MG tablet Take 10 mg by mouth daily.      . fenofibrate (TRICOR) 145 MG tablet TAKE 1 TABLET BY MOUTH DAILY 90 tablet 0  . fluticasone (FLONASE) 50 MCG/ACT nasal spray Place 1 spray into both nostrils daily.    . fluticasone (FLONASE) 50 MCG/ACT nasal spray Place 1 spray into both nostrils as directed.    Marland Kitchen glucose blood test strip 1 each by Other route daily. Use to check blood sugar daily Dx e11.9 100 each 11  . irbesartan (AVAPRO) 300 MG tablet TAKE 1 TABLET(300 MG) BY MOUTH AT BEDTIME 90 tablet 1  . meclizine (ANTIVERT) 12.5 MG tablet Take 1 tablet (12.5 mg total) by mouth 3 (three) times daily as needed for dizziness. 40 tablet 1  .  metoprolol succinate (TOPROL-XL) 100 MG 24 hr tablet TAKE 1 TABLET BY MOUTH EVERY DAY 90 tablet 1  . ondansetron (ZOFRAN) 4 MG tablet Take 1 tablet (4 mg total) by mouth every 8 (eight) hours as needed for nausea or vomiting. As needed for nausea or vomiting. 20 tablet 1  . ONETOUCH DELICA LANCETS 26Z MISC USE TO HELP CHECK BLOOD SUGAR DAILY 200 each 0  . oxcarbazepine (TRILEPTAL) 600 MG tablet Take 600 mg by mouth 2 (two) times daily.    . potassium chloride (K-DUR) 10 MEQ tablet Take 1 tablet (10 mEq total) by mouth 2 (two) times daily. 90 tablet 11  . potassium chloride (K-DUR,KLOR-CON) 10 MEQ tablet TAKE 2 TABLETS BY MOUTH DAILY 180 tablet 0  . topiramate (TOPAMAX) 100 MG tablet Take 100 mg by mouth daily.   0   No current facility-administered medications on file prior to visit.    Review of Systems Constitutional: Negative for increased diaphoresis, or other  activity, appetite or siginficant weight change other than noted HENT: Negative for worsening hearing loss, ear pain, facial swelling, mouth sores and neck stiffness.   Eyes: Negative for other worsening pain, redness or visual disturbance.  Respiratory: Negative for choking or stridor Cardiovascular: Negative for other chest pain and palpitations.  Gastrointestinal: Negative for worsening diarrhea, blood in stool, or abdominal distention Genitourinary: Negative for hematuria, flank pain or change in urine volume.  Musculoskeletal: Negative for myalgias or other joint complaints.  Skin: Negative for other color change and wound or drainage.  Neurological: Negative for syncope and numbness. other than noted Hematological: Negative for adenopathy. or other swelling Psychiatric/Behavioral: Negative for hallucinations, SI, self-injury, decreased concentration or other worsening agitation.      Objective:   Physical Exam BP 140/82   Pulse 90   Temp 98.4 F (36.9 C) (Oral)   Resp 20   Wt 175 lb (79.4 kg)   SpO2 92%   BMI 25.84  kg/m  VS noted,  Constitutional: Pt is oriented to person, place, and time. Appears well-developed and well-nourished, in no significant distress Head: Normocephalic and atraumatic  Eyes: Conjunctivae and EOM are normal. Pupils are equal, round, and reactive to light Right Ear: External ear normal.  Left Ear: External ear normal Nose: Nose normal.  Mouth/Throat: Oropharynx is clear and moist  Neck: Normal range of motion. Neck supple. No JVD present. No tracheal deviation present or significant neck LA or mass Cardiovascular: Normal rate, regular rhythm, normal heart sounds and intact distal pulses.   Pulmonary/Chest: Effort normal and breath sounds without rales or wheezing  Abdominal: Soft. Bowel sounds are normal. NT. No HSM , mod to large RLQ mass/swelling reducible, mild tender Musculoskeletal: Normal range of motion. Exhibits no edema Lymphadenopathy: Has no cervical adenopathy.  Neurological: Pt is alert and oriented to person, place, and time. Pt has normal reflexes. No cranial nerve deficit. Motor grossly intact Skin: Skin is warm and dry. No rash noted or new ulcers Psychiatric:  Has normal mood and affect. Behavior is normal.     Assessment & Plan:

## 2015-09-04 NOTE — Patient Instructions (Signed)
Ok to increase the lipitor to 20 mg per day  Please continue all other medications as before, and refills have been done if requested.  Please have the pharmacy call with any other refills you may need.  Please continue your efforts at being more active, low cholesterol diet, and weight control.  You are otherwise up to date with prevention measures today.  Please keep your appointments with your specialists as you may have planned  You will be contacted regarding the referral for: CT scan  Please go to the LAB in the Basement (turn left off the elevator) for the tests to be done today  You will be contacted by phone if any changes need to be made immediately.  Otherwise, you will receive a letter about your results with an explanation, but please check with MyChart first.  Please remember to sign up for MyChart if you have not done so, as this will be important to you in the future with finding out test results, communicating by private email, and scheduling acute appointments online when needed.  Please return in 6 months, or sooner if needed, with Lab testing done 3-5 days before

## 2015-09-04 NOTE — Progress Notes (Signed)
Pre visit review using our clinic review tool, if applicable. No additional management support is needed unless otherwise documented below in the visit note. 

## 2015-09-05 ENCOUNTER — Encounter: Payer: Self-pay | Admitting: Internal Medicine

## 2015-09-05 ENCOUNTER — Other Ambulatory Visit: Payer: Self-pay | Admitting: Internal Medicine

## 2015-09-05 MED ORDER — METFORMIN HCL ER 500 MG PO TB24: 500.0000 mg | ORAL_TABLET | Freq: Every day | ORAL | 3 refills | Status: DC

## 2015-09-05 MED ORDER — ATORVASTATIN CALCIUM 40 MG PO TABS: 40.0000 mg | ORAL_TABLET | Freq: Every day | ORAL | 3 refills | Status: DC

## 2015-09-05 NOTE — Assessment & Plan Note (Signed)
stable overall by history and exam, recent data reviewed with pt, and pt to continue medical treatment as before,  to f/u any worsening symptoms or concerns BP Readings from Last 3 Encounters:  09/04/15 140/82  06/26/15 (!) 164/86  06/08/15 126/61

## 2015-09-05 NOTE — Assessment & Plan Note (Addendum)
?   abd wall hernia vs other - for ct abd/pelvis,  to f/u any worsening symptoms or concerns  In addition to the time spent performing CPE, I spent an additional 25 minutes face to face,in which greater than 50% of this time was spent in counseling and coordination of care for patient's acute illness as documented.

## 2015-09-05 NOTE — Assessment & Plan Note (Signed)
stable overall by history and exam, recent data reviewed with pt, and pt to continue medical treatment as before,  to f/u any worsening symptoms or concerns Lab Results  Component Value Date   LDLCALC 79 09/11/2012    

## 2015-09-05 NOTE — Assessment & Plan Note (Signed)

## 2015-09-05 NOTE — Assessment & Plan Note (Signed)
stable overall by history and exam, recent data reviewed with pt, and pt to continue medical treatment as before,  to f/u any worsening symptoms or concerns Lab Results  Component Value Date   HGBA1C 9.9 (H) 09/04/2015

## 2015-09-16 ENCOUNTER — Inpatient Hospital Stay: Admission: RE | Admit: 2015-09-16 | Payer: BC Managed Care – PPO | Source: Ambulatory Visit

## 2015-09-18 ENCOUNTER — Ambulatory Visit (INDEPENDENT_AMBULATORY_CARE_PROVIDER_SITE_OTHER)
Admission: RE | Admit: 2015-09-18 | Discharge: 2015-09-18 | Disposition: A | Discharge status: Home / Self Care | Payer: BC Managed Care – PPO | Source: Ambulatory Visit | Attending: Internal Medicine | Admitting: Internal Medicine

## 2015-09-18 ENCOUNTER — Encounter: Payer: Self-pay | Admitting: Internal Medicine

## 2015-09-18 ENCOUNTER — Encounter (INDEPENDENT_AMBULATORY_CARE_PROVIDER_SITE_OTHER): Payer: Self-pay

## 2015-09-18 DIAGNOSIS — R1903 Right lower quadrant abdominal swelling, mass and lump: Secondary | ICD-10-CM | POA: Diagnosis not present

## 2015-09-18 MED ORDER — IOPAMIDOL (ISOVUE-300) INJECTION 61%
100.0000 mL | Freq: Once | INTRAVENOUS | Status: AC | PRN
  Administered 2015-09-18: 100 mL via INTRAVENOUS

## 2015-09-29 ENCOUNTER — Encounter: Payer: Self-pay | Admitting: Internal Medicine

## 2015-09-29 ENCOUNTER — Ambulatory Visit (INDEPENDENT_AMBULATORY_CARE_PROVIDER_SITE_OTHER): Payer: BC Managed Care – PPO | Admitting: Internal Medicine

## 2015-09-29 ENCOUNTER — Telehealth: Payer: Self-pay

## 2015-09-29 ENCOUNTER — Telehealth: Payer: Self-pay | Admitting: Cardiology

## 2015-09-29 ENCOUNTER — Encounter: Payer: BC Managed Care – PPO | Admitting: *Deleted

## 2015-09-29 VITALS — BP 130/72 | HR 86 | Temp 98.4°F | Resp 20 | Wt 175.0 lb

## 2015-09-29 DIAGNOSIS — R197 Diarrhea, unspecified: Secondary | ICD-10-CM

## 2015-09-29 DIAGNOSIS — H8110 Benign paroxysmal vertigo, unspecified ear: Secondary | ICD-10-CM | POA: Diagnosis not present

## 2015-09-29 DIAGNOSIS — R1084 Generalized abdominal pain: Secondary | ICD-10-CM

## 2015-09-29 DIAGNOSIS — R112 Nausea with vomiting, unspecified: Secondary | ICD-10-CM

## 2015-09-29 MED ORDER — ONDANSETRON HCL 4 MG PO TABS: 4.0000 mg | ORAL_TABLET | Freq: Three times a day (TID) | ORAL | 1 refills | Status: DC | PRN

## 2015-09-29 MED ORDER — METRONIDAZOLE 500 MG PO TABS: 500.0000 mg | ORAL_TABLET | Freq: Three times a day (TID) | ORAL | 0 refills | Status: DC

## 2015-09-29 NOTE — Telephone Encounter (Signed)
A user error has taken place.

## 2015-09-29 NOTE — Telephone Encounter (Signed)
LMOVM reminding pt to send remote transmission.   

## 2015-09-29 NOTE — Progress Notes (Signed)
Pre visit review using our clinic review tool, if applicable. No additional management support is needed unless otherwise documented below in the visit note. 

## 2015-09-29 NOTE — Progress Notes (Signed)
Subjective:    Patient ID: Stacey [name], female    DOB: 1950/06/10, 65 y.o.   MRN: [id number]  HPI  Here with 1 wk onset abd pain, n/v, diarrhea and HA, general weakness and malaise, mild dizyz that may be her vertigo,  without fever but may have felt warm sometimes. Overall seemed worse x last 3-4 days, just not getting better even today.  Abd pain is crampy, mod to severe, intermittent, without radiation, but assoc with constant nausea, with about 20 times vomiting last few days, ? Bile (yellow per pt) , no blood.  Diarrhea is watery mixed with stool, no BRBPR. May be foul smelling per pt.  Has numerous at least small to medium stools every day for past 4-5 days.  + chills occasionally as well.  No rash, no joint issues.  No CP, sob, cough, wheezing.  Denies urinary symptoms such as dysuria, frequency, urgency, flank pain, hematuria or n/v, fever, chills. In fact has been going less urine volume.   No h of c diff, no recent antibx.  Does not take daily diuretic.  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 ANGIOEDEMA 03/07/2008   a. with ACE-I  . ANXIETY 09/16/2006  . ASTHMA 08/01/2006  . CAD (coronary artery disease)    a. 01/2010 : Minimal plaque at cardiac catheterization - CFX 20%, EF 70%;  b. 08/2012 Cath: Nl Cors, EF 65%.  . Chronic diastolic CHF (congestive heart failure) ([facility]) 04/06/2010   a. In setting of HOCM.  Marland Kitchen Complete heart block ([facility]) 02/26/2014   a. s/p MDT dual chamber pacemaker 02/2014  . COPD (chronic obstructive pulmonary disease) ([facility])   . DEPRESSION 09/16/2006  . DIVERTICULOSIS, COLON, WITH HEMORRHAGE 04/06/2010  . DM (diabetes mellitus) in pregnancy, delivered w/postpartum condition 08/30/2010  . HYPERLIPIDEMIA 08/01/2006  . HYPERTENSION 08/01/2006  . Hypertrophic obstructive cardiomyopathy 08/01/2006   a. s/p septal myomectomy 4/12 at [facility] with Dr.  Silvestre Mesi;  b. echo 5/12: EF 60-65%, LVOT peak 18 mmHg; grade 1 diast dysfxn, mild SAM (improved since myomectomy;  c. 03/2012 Echo: EF 60-65%, mod-sev basal septal asymm hypertrophy, basal septal HK, LVOT grad , Gr 1 DD, , SAM, Mild MR, nl RV, PASP ; 08/2012 Echo: technically difficult, doubt LVOT obstruction, EF 60%, Gr 1 DD, mod-sev dil LA.  . Impaired glucose tolerance 06/25/2010  . LBBB (left bundle branch block) 06/17/2010  . OBESITY 09/16/2006  . 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 SICKLE CELL ANEMIA 08/01/2006  . Type II or unspecified type diabetes mellitus without mention of complication, uncontrolled 08/30/2010  . VENTRAL HERNIA 12/07/2006   Past Surgical History:  Procedure Laterality Date  . ABDOMINAL HYSTERECTOMY  1987  . CARDIAC SURGERY    . ELECTROPHYSIOLOGY STUDY N/A 09/12/2012   Procedure: ELECTROPHYSIOLOGY STUDY;  Surgeon: [name], MD;  [facility];  Service: Cardiovascular;  Laterality: N/A;  . LEFT HEART CATH  Aug. 18, 2014   Medtronic heart device  . LEFT HEART CATHETERIZATION WITH CORONARY ANGIOGRAM N/A 09/10/2012   Procedure: LEFT HEART CATHETERIZATION WITH CORONARY ANGIOGRAM;  Surgeon: Kathleene Hazel, MD;  Location[facility];  Service: Cardiovascular;  Laterality: N/A;  . MYOMECTOMY     Septal  . PERMANENT PACEMAKER INSERTION N/A 02/26/2014   MDT Advisa dual chamber pacemaker implanted by Dr Graciela Husbands for heart block  .  VENTRAL HERNIA REPAIR  06/2006    reports that she quit smoking about 17 years ago. Her smoking use included Cigarettes. She has a 5.00 pack-year smoking history. She has never used smokeless tobacco. She reports that she does not drink alcohol or use drugs. family history includes Cancer in her daughter; Clotting disorder in her daughter and other; Colon cancer in her daughter; Diabetes in her paternal grandmother; Heart attack in her father and sister; Heart disease in her daughter and father; Hyperlipidemia in her brother, daughter,  and sister; Hypertension in her brother, father, sister, and son; Stroke in her sister. Allergies  Allergen Reactions  . Dairy Aid [Lactase] Diarrhea and Other (See Comments)    Bloating and gastric distress  . Ace Inhibitors Other (See Comments)    REACTION: angioedema right eye  . Aspirin Other (See Comments)    Bleeding   . Codeine Other (See Comments)    Hallucinations, can take if with someone.  . Soy Allergy Other (See Comments)    Bleeding & cramps   Current Outpatient Prescriptions on File Prior to Visit  Medication Sig Dispense Refill  . albuterol (PROVENTIL HFA;VENTOLIN HFA) 108 (90 BASE) MCG/ACT inhaler Inhale 2 puffs into the lungs every 6 (six) hours as needed for wheezing or shortness of breath. 18 g 5  . amLODipine (NORVASC) 10 MG tablet TAKE 1 TABLET BY MOUTH EVERY DAY 90 tablet 1  . amLODipine (NORVASC) 10 MG tablet TAKE 1 TABLET BY MOUTH EVERY DAY 90 tablet 0  . atorvastatin (LIPITOR) 40 MG tablet Take 1 tablet (40 mg total) by mouth daily. 90 tablet 3  . Blood Glucose Monitoring Suppl (ONE TOUCH ULTRA 2) W/DEVICE KIT Use to check blood sugars daily Dx E11.9 1 each 0  . cetirizine (ZYRTEC) 10 MG tablet Take 10 mg by mouth daily.      . fenofibrate (TRICOR) 145 MG tablet TAKE 1 TABLET BY MOUTH DAILY 90 tablet 0  . fluticasone (FLONASE) 50 MCG/ACT nasal spray Place 1 spray into both nostrils daily.    . fluticasone (FLONASE) 50 MCG/ACT nasal spray Place 1 spray into both nostrils as directed.    Marland Kitchen glucose blood test strip 1 each by Other route daily. Use to check blood sugar daily Dx e11.9 100 each 11  . irbesartan (AVAPRO) 300 MG tablet TAKE 1 TABLET(300 MG) BY MOUTH AT BEDTIME 90 tablet 1  . meclizine (ANTIVERT) 12.5 MG tablet Take 1 tablet (12.5 mg total) by mouth 3 (three) times daily as needed for dizziness. 40 tablet 1  . metFORMIN (GLUCOPHAGE-XR) 500 MG 24 hr tablet Take 1 tablet (500 mg total) by mouth daily with breakfast. 90 tablet 3  . metoprolol succinate  (TOPROL-XL) 100 MG 24 hr tablet TAKE 1 TABLET BY MOUTH EVERY DAY 90 tablet 1  . ondansetron (ZOFRAN) 4 MG tablet Take 1 tablet (4 mg total) by mouth every 8 (eight) hours as needed for nausea or vomiting. As needed for nausea or vomiting. 20 tablet 1  . ONETOUCH DELICA LANCETS 75I MISC USE TO HELP CHECK BLOOD SUGAR DAILY 200 each 0  . oxcarbazepine (TRILEPTAL) 600 MG tablet Take 600 mg by mouth 2 (two) times daily.    . potassium chloride (K-DUR) 10 MEQ tablet Take 1 tablet (10 mEq total) by mouth 2 (two) times daily. 90 tablet 11  . potassium chloride (K-DUR,KLOR-CON) 10 MEQ tablet TAKE 2 TABLETS BY MOUTH DAILY 180 tablet 0  . topiramate (TOPAMAX) 100 MG tablet Take 100 mg by  mouth daily.   0   No current facility-administered medications on file prior to visit.    Review of Systems  Constitutional: Negative for unusual diaphoresis or night sweats HENT: Negative for ear swelling or discharge Eyes: Negative for worsening visual haziness  Respiratory: Negative for choking and stridor.   Genitourinary: Negative for retention or change in urine volume.  Musculoskeletal: Negative for other MSK pain or swelling Skin: Negative for color change and worsening wound Neurological: Negative for tremors and numbness other than noted  Psychiatric/Behavioral: Negative for decreased concentration or agitation other than above       Objective:   Physical Exam BP 130/72   Pulse 86   Temp 98.4 F (36.9 C) (Oral)   Resp 20   Wt 175 lb (79.4 kg)   SpO2 95%   BMI 25.84 kg/m  Orthostatics as documented reieiwed as well - not orthostatic VS noted, mild ill, fatigued appaering, nontoxic, but nauseas Constitutional: Pt appears in no apparent distress HENT: Head: NCAT.  Right Ear: External ear normal.  Left Ear: External ear normal.  Eyes: . Pupils are equal, round, and reactive to light. Conjunctivae and EOM are normal Neck: Normal range of motion. Neck supple.  Cardiovascular: Normal rate and  regular rhythm.   Pulmonary/Chest: Effort normal and breath sounds without rales or wheezing.  Abd:  Soft,, ND, + BS with mild diffuse tender, no guarding or rebound Neurological: Pt is alert. Not confused , motor grossly intact Skin: Skin is warm. No rash, no LE edema Psychiatric: Pt behavior is normal. No agitation.     Assessment & Plan:

## 2015-09-29 NOTE — Assessment & Plan Note (Signed)
For zofran IM now, then po for home,  to f/u any worsening symptoms or concerns

## 2015-09-29 NOTE — Patient Instructions (Addendum)
You had the nausea shot today  Please take all new medication as prescribed - the antibiotic, and nausea medication  Please continue all other medications as before, and refills have been done if requested.  Please have the pharmacy call with any other refills you may need.  Please keep your appointments with your specialists as you may have planned  Please go to the LAB in the Basement (turn left off the elevator) for the tests to be done today  You will be contacted by phone if any changes need to be made immediately.  Otherwise, you will receive a letter about your results with an explanation, but please check with MyChart first.  Please remember to sign up for MyChart if you have not done so, as this will be important to you in the future with finding out test results, communicating by private email, and scheduling acute appointments online when needed.

## 2015-09-29 NOTE — Assessment & Plan Note (Signed)
Generalized, for labs as ordered,  to f/u any worsening symptoms or concerns

## 2015-09-29 NOTE — Assessment & Plan Note (Signed)
Cant r/o c diff, for stool studies and empiric flagyl course, consider GI referral

## 2015-10-02 ENCOUNTER — Encounter: Payer: Self-pay | Admitting: Cardiology

## 2015-10-06 ENCOUNTER — Other Ambulatory Visit: Payer: Self-pay | Admitting: Internal Medicine

## 2015-10-22 ENCOUNTER — Other Ambulatory Visit: Payer: Self-pay | Admitting: Internal Medicine

## 2015-10-22 NOTE — Progress Notes (Signed)
This encounter was created in error - please disregard.

## 2015-12-04 ENCOUNTER — Other Ambulatory Visit: Payer: Self-pay | Admitting: Internal Medicine

## 2015-12-14 ENCOUNTER — Ambulatory Visit: Payer: BC Managed Care – PPO | Admitting: Surgery

## 2015-12-30 ENCOUNTER — Telehealth: Payer: Self-pay | Admitting: Surgery

## 2015-12-30 NOTE — Telephone Encounter (Signed)
-----   Message from Massie KluverKimberly A McKenzie sent at 12/30/2015 10:42 AM EST ----- Regarding: RE: CTA Scheduling From the looks of it. I rescheduled the follow up since the doctor was going to out and didn't look for a CTA. [facility] and it looks like she didn't schedule the CTA either. Thanks for catching it. Would you be able to schedule this for me?   ----- Message ----- From: Shari ProwsAnnette W Tobin Sent: 12/28/2015   3:22 PM To: Ranae PlumberKimberly A McKenzie Subject: CTA Scheduling                                 Hi Kim! I am checking the DAR for any CT's that need to be scheduled. This patient is scheduled to see [facility] on 02/01/16 but I don't see a CTA prior. She had a CTA Ab/Pelvis on 09/16/15. I am not sure if this is current enough for this appointment in January. And you may be already working on this and if so, please ignore my staff message. Thanks, Drinda ButtsAnnette

## 2015-12-30 NOTE — Telephone Encounter (Signed)
Scheduled CTA Abd/Pelvis as ordered by [facility] for 01/21/16 at 10:30am. Mailed letter to the patient regarding this as well as called her to verbally give the information to her.

## 2015-12-31 ENCOUNTER — Encounter: Payer: Self-pay | Admitting: Internal Medicine

## 2015-12-31 ENCOUNTER — Ambulatory Visit (INDEPENDENT_AMBULATORY_CARE_PROVIDER_SITE_OTHER): Payer: BC Managed Care – PPO | Admitting: Internal Medicine

## 2015-12-31 VITALS — BP 134/70 | HR 90 | Temp 98.9°F | Resp 20

## 2015-12-31 DIAGNOSIS — E119 Type 2 diabetes mellitus without complications: Secondary | ICD-10-CM | POA: Diagnosis not present

## 2015-12-31 DIAGNOSIS — K529 Noninfective gastroenteritis and colitis, unspecified: Secondary | ICD-10-CM | POA: Insufficient documentation

## 2015-12-31 DIAGNOSIS — H8110 Benign paroxysmal vertigo, unspecified ear: Secondary | ICD-10-CM

## 2015-12-31 DIAGNOSIS — I10 Essential (primary) hypertension: Secondary | ICD-10-CM

## 2015-12-31 MED ORDER — PROMETHAZINE HCL 12.5 MG RE SUPP: 12.5000 mg | Freq: Four times a day (QID) | RECTAL | 1 refills | Status: DC | PRN

## 2015-12-31 MED ORDER — ONDANSETRON HCL 4 MG PO TABS: 4.0000 mg | ORAL_TABLET | Freq: Three times a day (TID) | ORAL | 1 refills | Status: DC | PRN

## 2015-12-31 NOTE — Assessment & Plan Note (Signed)
stable overall by history and exam, recent data reviewed with pt, and pt to continue medical treatment as before,  to f/u any worsening symptoms or concerns BP Readings from Last 3 Encounters:  12/31/15 134/70  09/29/15 130/72  09/04/15 140/82

## 2015-12-31 NOTE — Progress Notes (Signed)
Subjective:    Patient ID: Stacey [name], female    DOB: 08-30-1950, 65 y.o.   MRN: [id number]  HPI  Here with c/o 2 days onset feeling warm, nausea, general malaise and fatigue, occ crampy stomach pains and mult loose and watery stools, without blood, chills and Pt denies chest pain, increased sob or doe, wheezing, orthopnea, PND, increased LE swelling, palpitations, dizziness or syncope. No rash, joint pain,  Pt denies polydipsia, polyuria.  Not on diuretic Past Medical History:  Diagnosis Date  . ANGIOEDEMA 03/07/2008   a. with ACE-I  . ANXIETY 09/16/2006  . ASTHMA 08/01/2006  . CAD (coronary artery disease)    a. 01/2010 : Minimal plaque at cardiac catheterization - CFX 20%, EF 70%;  b. 08/2012 Cath: Nl Cors, EF 65%.  . Chronic diastolic CHF (congestive heart failure) ([place]) 04/06/2010   a. In setting of HOCM.  Marland Kitchen Complete heart block ([place]) 02/26/2014   a. s/p MDT dual chamber pacemaker 02/2014  . COPD (chronic obstructive pulmonary disease) ([place])   . DEPRESSION 09/16/2006  . DIVERTICULOSIS, COLON, WITH HEMORRHAGE 04/06/2010  . DM (diabetes mellitus) in pregnancy, delivered w/postpartum condition 08/30/2010  . HYPERLIPIDEMIA 08/01/2006  . HYPERTENSION 08/01/2006  . 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 s/p septal myomectomy 4/12 at [facility] with Dr. Evelina Dun;  b. echo 5/12: EF 60-65%, LVOT peak 18 mmHg; grade 1 diast dysfxn, mild SAM (improved since myomectomy;  c. 03/2012 Echo: EF 60-65%, mod-sev basal septal asymm hypertrophy, basal septal HK, LVOT grad 50mHg, Gr 1 DD, , SAM, Mild MR, nl RV, PASP 373mg; 08/2012 Echo: technically difficult, doubt LVOT obstruction, EF 60%, Gr 1 DD, mod-sev dil LA.  . Impaired glucose tolerance 06/25/2010  . LBBB (left bundle branch block) 06/17/2010  . OBESITY 09/16/2006  . [place] artery [place]  . SICKLE CELL ANEMIA 08/01/2006  . Type II or unspecified type diabetes mellitus without mention of complication, uncontrolled 08/30/2010  . VENTRAL HERNIA 12/07/2006    Past Surgical History:  Procedure Laterality Date  . ABDOMINAL HYSTERECTOMY  1987  . CARDIAC SURGERY    . ELECTROPHYSIOLOGY STUDY N/A 09/12/2012   Procedure: ELECTROPHYSIOLOGY STUDY;  Surgeon: StDeboraha SprangMD;  [facility];  Service: Cardiovascular;  Laterality: N/A;  . LEFT HEART CATH  Aug. 18, 2014   Medtronic heart device  . LEFT HEART CATHETERIZATION WITH CORONARY ANGIOGRAM N/A 09/10/2012   Procedure: LEFT HEART CATHETERIZATION WITH CORONARY ANGIOGRAM;  Surgeon: ChBurnell BlanksMD;  [facility];  Service: Cardiovascular;  Laterality: N/A;  . MYOMECTOMY     Septal  . PERMANENT PACEMAKER INSERTION N/A 02/26/2014   MDT Advisa dual chamber pacemaker implanted by Dr KlCaryl Comesor heart block  . VENTRAL HERNIA REPAIR  06/2006    reports that she quit smoking about 17 years ago. Her smoking use included Cigarettes. She has a 5.00 pack-year smoking history. She has never used smokeless tobacco. She reports that she does not drink alcohol or use drugs. family history includes Cancer in her daughter; Clotting disorder in her daughter and other; Colon cancer in her daughter; Diabetes in her paternal grandmother; Heart attack in her father and sister; Heart disease in her daughter and father; Hyperlipidemia in her brother, daughter, and sister; Hypertension in her brother, father, sister, and son; Stroke in her sister. Allergies  Allergen Reactions  . Dairy Aid [Lactase] Diarrhea and Other (See Comments)    Bloating and gastric distress  . Ace Inhibitors Other (See  Comments)    REACTION: angioedema right eye  . Aspirin Other (See Comments)    Bleeding   . Codeine Other (See Comments)    Hallucinations, can take if with someone.  . Soy Allergy Other (See Comments)    Bleeding & cramps   Current Outpatient Prescriptions on File Prior to Visit  Medication Sig Dispense Refill  . albuterol (PROVENTIL HFA;VENTOLIN HFA) 108 (90 BASE) MCG/ACT inhaler Inhale 2 puffs into the  lungs every 6 (six) hours as needed for wheezing or shortness of breath. 18 g 5  . amLODipine (NORVASC) 10 MG tablet TAKE 1 TABLET BY MOUTH EVERY DAY 90 tablet 1  . atorvastatin (LIPITOR) 40 MG tablet Take 1 tablet (40 mg total) by mouth daily. 90 tablet 3  . Blood Glucose Monitoring Suppl (ONE TOUCH ULTRA 2) W/DEVICE KIT Use to check blood sugars daily Dx E11.9 1 each 0  . cetirizine (ZYRTEC) 10 MG tablet Take 10 mg by mouth daily.      . fenofibrate (TRICOR) 145 MG tablet TAKE 1 TABLET BY MOUTH DAILY 90 tablet 0  . fluticasone (FLONASE) 50 MCG/ACT nasal spray Place 1 spray into both nostrils daily.    Marland Kitchen glucose blood test strip 1 each by Other route daily. Use to check blood sugar daily Dx e11.9 100 each 11  . irbesartan (AVAPRO) 300 MG tablet TAKE 1 TABLET(300 MG) BY MOUTH AT BEDTIME 90 tablet 0  . meclizine (ANTIVERT) 12.5 MG tablet Take 1 tablet (12.5 mg total) by mouth 3 (three) times daily as needed for dizziness. 40 tablet 1  . metFORMIN (GLUCOPHAGE-XR) 500 MG 24 hr tablet Take 1 tablet (500 mg total) by mouth daily with breakfast. 90 tablet 3  . metoprolol succinate (TOPROL-XL) 100 MG 24 hr tablet TAKE 1 TABLET BY MOUTH EVERY DAY 90 tablet 1  . metoprolol succinate (TOPROL-XL) 100 MG 24 hr tablet TAKE 1 TABLET BY MOUTH EVERY DAY 90 tablet 0  . metroNIDAZOLE (FLAGYL) 500 MG tablet Take 1 tablet (500 mg total) by mouth 3 (three) times daily. 2130 tablet 0  . ONETOUCH DELICA LANCETS 00B MISC USE TO HELP CHECK BLOOD SUGAR DAILY 200 each 0  . oxcarbazepine (TRILEPTAL) 600 MG tablet Take 600 mg by mouth 2 (two) times daily.    . potassium chloride (K-DUR) 10 MEQ tablet Take 1 tablet (10 mEq total) by mouth 2 (two) times daily. 90 tablet 11  . potassium chloride (K-DUR,KLOR-CON) 10 MEQ tablet TAKE 2 TABLETS BY MOUTH DAILY 180 tablet 0  . potassium chloride (K-DUR,KLOR-CON) 10 MEQ tablet TAKE 2 TABLETS BY MOUTH DAILY 180 tablet 0  . topiramate (TOPAMAX) 100 MG tablet Take 100 mg by mouth daily.    0   No current facility-administered medications on file prior to visit.    Review of Systems  Constitutional: Negative for unusual diaphoresis or night sweats HENT: Negative for ear swelling or discharge Eyes: Negative for worsening visual haziness  Respiratory: Negative for choking and stridor.   Gastrointestinal: Negative for distension or worsening eructation Genitourinary: Negative for retention or change in urine volume.  Musculoskeletal: Negative for other MSK pain or swelling Skin: Negative for color change and worsening wound Neurological: Negative for tremors and numbness other than noted  Psychiatric/Behavioral: Negative for decreased concentration or agitation other than above   All other system neg per pt     Objective:   Physical Exam BP 134/70   Pulse 90   Temp 98.9 F (37.2 C) (Oral)   Resp  20   SpO2 96%  VS noted,  Constitutional: Pt appears in no apparent distress HENT: Head: NCAT.  Right Ear: External ear normal.  Left Ear: External ear normal.  Eyes: . Pupils are equal, round, and reactive to light. Conjunctivae and EOM are normal Neck: Normal range of motion. Neck supple.  Cardiovascular: Normal rate and regular rhythm.   Pulmonary/Chest: Effort normal and breath sounds without rales or wheezing.  Abd:  Soft, NT, ND, + BS Neurological: Pt is alert. Not confused , motor grossly intact Skin: Skin is warm. No rash, no LE edema Psychiatric: Pt behavior is normal. No agitation.  No other new exam findings    Assessment & Plan:

## 2015-12-31 NOTE — Assessment & Plan Note (Signed)
Lab Results  Component Value Date   HGBA1C 9.9 (H) 09/04/2015   Has been suboptimal control, now asympt, to cont monitor cbg's closely, call for > 200

## 2015-12-31 NOTE — Patient Instructions (Addendum)
You had the nausea shot today (zofran)  Please take all new medication as prescribed - the suppository or pills for nausea (dont take at the same time - only take one or the other)  Please continue all other medications as before, and refills have been done if requested.  Please have the pharmacy call with any other refills you may need..  Please keep your appointments with your specialists as you may have planned  You are given the work note

## 2015-12-31 NOTE — Assessment & Plan Note (Signed)
Mild to mod, c/w probable viral illness, declines labs, for immodium prn, phenergan supp prn/zofran po prn, increased po fluids, to f/u any worsening symptoms or concerns

## 2015-12-31 NOTE — Progress Notes (Signed)
Pre visit review using our clinic review tool, if applicable. No additional management support is needed unless otherwise documented below in the visit note. 

## 2016-01-15 ENCOUNTER — Encounter: Payer: Self-pay | Admitting: Surgery

## 2016-01-20 ENCOUNTER — Other Ambulatory Visit: Payer: Self-pay | Admitting: Internal Medicine

## 2016-01-21 ENCOUNTER — Ambulatory Visit
Admission: RE | Admit: 2016-01-21 | Discharge: 2016-01-21 | Disposition: A | Discharge status: Home / Self Care | Payer: BC Managed Care – PPO | Source: Ambulatory Visit | Attending: Family | Admitting: Family

## 2016-01-21 DIAGNOSIS — I722 Aneurysm of renal artery: Secondary | ICD-10-CM

## 2016-01-21 MED ORDER — IOPAMIDOL (ISOVUE-370) INJECTION 76%
100.0000 mL | Freq: Once | INTRAVENOUS | Status: AC | PRN
  Administered 2016-01-21: 100 mL via INTRAVENOUS

## 2016-02-01 ENCOUNTER — Ambulatory Visit: Payer: BC Managed Care – PPO | Admitting: Surgery

## 2016-02-25 ENCOUNTER — Ambulatory Visit (INDEPENDENT_AMBULATORY_CARE_PROVIDER_SITE_OTHER): Payer: BC Managed Care – PPO | Admitting: Internal Medicine

## 2016-02-25 VITALS — BP 138/78 | HR 78 | Temp 98.7°F | Resp 20 | Wt 175.0 lb

## 2016-02-25 DIAGNOSIS — E119 Type 2 diabetes mellitus without complications: Secondary | ICD-10-CM | POA: Diagnosis not present

## 2016-02-25 DIAGNOSIS — I10 Essential (primary) hypertension: Secondary | ICD-10-CM | POA: Diagnosis not present

## 2016-02-25 DIAGNOSIS — Z0001 Encounter for general adult medical examination with abnormal findings: Secondary | ICD-10-CM

## 2016-02-25 DIAGNOSIS — E785 Hyperlipidemia, unspecified: Secondary | ICD-10-CM | POA: Diagnosis not present

## 2016-02-25 NOTE — Progress Notes (Signed)
Pre visit review using our clinic review tool, if applicable. No additional management support is needed unless otherwise documented below in the visit note. 

## 2016-02-25 NOTE — Progress Notes (Signed)
Subjective:    Patient ID: Stacey [name], female    DOB: April 09, 1950, 66 y.o.   MRN: [id number]  HPI Here to f/u; overall doing ok,  Pt denies chest pain, increasing sob or doe, wheezing, orthopnea, PND, increased LE swelling, palpitations, dizziness or syncope.  Pt denies new neurological symptoms such as new headache, or facial or extremity weakness or numbness.  Pt denies polydipsia, polyuria, or low sugar episode.   Pt denies new neurological symptoms such as new headache, or facial or extremity weakness or numbness.   Pt states overall good compliance with meds, mostly trying to follow appropriate diet, with wt overall stable,  but little exercise however. Only taking Half metformin pill due to GI upset No new hx Past Medical History:  Diagnosis Date  . ANGIOEDEMA 03/07/2008   a. with ACE-I  . ANXIETY 09/16/2006  . ASTHMA 08/01/2006  . CAD (coronary artery disease)    a. 01/2010 : Minimal plaque at cardiac catheterization - CFX 20%, EF 70%;  b. 08/2012 Cath: Nl Cors, EF 65%.  . Chronic diastolic CHF (congestive heart failure) ([place]) 04/06/2010   a. In setting of HOCM.  Marland Kitchen Complete heart block ([place]) 02/26/2014   a. s/p MDT dual chamber pacemaker 02/2014  . COPD (chronic obstructive pulmonary disease) ([place])   . DEPRESSION 09/16/2006  . DIVERTICULOSIS, COLON, WITH HEMORRHAGE 04/06/2010  . DM (diabetes mellitus) in pregnancy, delivered w/postpartum condition 08/30/2010  . HYPERLIPIDEMIA 08/01/2006  . HYPERTENSION 08/01/2006  . 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 s/p septal myomectomy 4/12 at [facility] with Dr. Evelina Dun;  b. echo 5/12: EF 60-65%, LVOT peak 18 mmHg; grade 1 diast dysfxn, mild SAM (improved since myomectomy;  c. 03/2012 Echo: EF 60-65%, mod-sev basal septal asymm hypertrophy, basal septal HK, LVOT grad 46mHg, Gr 1 DD, , SAM, Mild MR, nl RV, PASP 319mg; 08/2012 Echo: technically difficult, doubt LVOT obstruction, EF 60%, Gr 1 DD, mod-sev dil LA.  . Impaired glucose tolerance 06/25/2010  .  LBBB (left bundle branch block) 06/17/2010  . OBESITY 09/16/2006  . [place]  . SICKLE CELL ANEMIA 08/01/2006  . Type II or unspecified type diabetes mellitus without mention of complication, uncontrolled 08/30/2010  . VENTRAL HERNIA 12/07/2006   Past Surgical History:  Procedure Laterality Date  . ABDOMINAL HYSTERECTOMY  1987  . CARDIAC SURGERY    . ELECTROPHYSIOLOGY STUDY N/A 09/12/2012   Procedure: ELECTROPHYSIOLOGY STUDY;  Surgeon: StDeboraha SprangMD;  [facility];  Service: Cardiovascular;  Laterality: N/A;  . LEFT HEART CATH  Aug. 18, 2014   Medtronic heart device  . LEFT HEART CATHETERIZATION WITH CORONARY ANGIOGRAM N/A 09/10/2012   Procedure: LEFT HEART CATHETERIZATION WITH CORONARY ANGIOGRAM;  Surgeon: ChBurnell BlanksMD;  [facility];  Service: Cardiovascular;  Laterality: N/A;  . MYOMECTOMY     Septal  . PERMANENT PACEMAKER INSERTION N/A 02/26/2014   MDT Advisa dual chamber pacemaker implanted by Dr KlCaryl Comesor heart block  . VENTRAL HERNIA REPAIR  06/2006    reports that she quit smoking about 18 years ago. Her smoking use included Cigarettes. She has a 5.00 pack-year smoking history. She has never used smokeless tobacco. She reports that she does not drink alcohol or use drugs. family history includes Cancer in her daughter; Clotting disorder in her daughter and other; Colon cancer in her daughter; Diabetes in her paternal grandmother; Heart attack in her father and sister; Heart disease in her daughter and father;  Hyperlipidemia in her brother, daughter, and sister; Hypertension in her brother, father, sister, and son; Stroke in her sister. Allergies  Allergen Reactions  . Dairy Aid [Lactase] Diarrhea and Other (See Comments)    Bloating and gastric distress  . Metformin And Related Other (See Comments)    GI upset  . Ace Inhibitors Other (See Comments)    REACTION: angioedema right eye  . Aspirin Other (See Comments)    Bleeding   .  Codeine Other (See Comments)    Hallucinations, can take if with someone.  . Soy Allergy Other (See Comments)    Bleeding & cramps   Current Outpatient Prescriptions on File Prior to Visit  Medication Sig Dispense Refill  . albuterol (PROVENTIL HFA;VENTOLIN HFA) 108 (90 BASE) MCG/ACT inhaler Inhale 2 puffs into the lungs every 6 (six) hours as needed for wheezing or shortness of breath. 18 g 5  . amLODipine (NORVASC) 10 MG tablet TAKE 1 TABLET BY MOUTH EVERY DAY 90 tablet 1  . amLODipine (NORVASC) 10 MG tablet TAKE 1 TABLET BY MOUTH EVERY DAY 90 tablet 0  . atorvastatin (LIPITOR) 40 MG tablet Take 1 tablet (40 mg total) by mouth daily. 90 tablet 3  . Blood Glucose Monitoring Suppl (ONE TOUCH ULTRA 2) W/DEVICE KIT Use to check blood sugars daily Dx E11.9 1 each 0  . cetirizine (ZYRTEC) 10 MG tablet Take 10 mg by mouth daily.      . fenofibrate (TRICOR) 145 MG tablet TAKE 1 TABLET BY MOUTH DAILY 90 tablet 0  . fluticasone (FLONASE) 50 MCG/ACT nasal spray Place 1 spray into both nostrils daily.    Marland Kitchen glucose blood test strip 1 each by Other route daily. Use to check blood sugar daily Dx e11.9 100 each 11  . irbesartan (AVAPRO) 300 MG tablet TAKE 1 TABLET(300 MG) BY MOUTH AT BEDTIME 90 tablet 0  . meclizine (ANTIVERT) 12.5 MG tablet Take 1 tablet (12.5 mg total) by mouth 3 (three) times daily as needed for dizziness. 40 tablet 1  . metoprolol succinate (TOPROL-XL) 100 MG 24 hr tablet TAKE 1 TABLET BY MOUTH EVERY DAY 90 tablet 1  . metoprolol succinate (TOPROL-XL) 100 MG 24 hr tablet TAKE 1 TABLET BY MOUTH EVERY DAY 90 tablet 0  . metoprolol succinate (TOPROL-XL) 100 MG 24 hr tablet TAKE 1 TABLET BY MOUTH EVERY DAY 90 tablet 0  . metroNIDAZOLE (FLAGYL) 500 MG tablet Take 1 tablet (500 mg total) by mouth 3 (three) times daily. 2130 tablet 0  . ondansetron (ZOFRAN) 4 MG tablet Take 1 tablet (4 mg total) by mouth every 8 (eight) hours as needed for nausea or vomiting. As needed for nausea or vomiting.  30 tablet 1  . ONETOUCH DELICA LANCETS 42V MISC USE TO HELP CHECK BLOOD SUGAR DAILY 200 each 0  . oxcarbazepine (TRILEPTAL) 600 MG tablet Take 600 mg by mouth 2 (two) times daily.    . potassium chloride (K-DUR) 10 MEQ tablet Take 1 tablet (10 mEq total) by mouth 2 (two) times daily. 90 tablet 11  . potassium chloride (K-DUR,KLOR-CON) 10 MEQ tablet TAKE 2 TABLETS BY MOUTH DAILY 180 tablet 0  . potassium chloride (K-DUR,KLOR-CON) 10 MEQ tablet TAKE 2 TABLETS BY MOUTH DAILY 180 tablet 0  . potassium chloride (K-DUR,KLOR-CON) 10 MEQ tablet TAKE 2 TABLETS BY MOUTH DAILY 180 tablet 0  . promethazine (PHENERGAN) 12.5 MG suppository Place 1 suppository (12.5 mg total) rectally every 6 (six) hours as needed for nausea or vomiting. 12 each 1  .  topiramate (TOPAMAX) 100 MG tablet Take 100 mg by mouth daily.   0   No current facility-administered medications on file prior to visit.    Review of Systems  Constitutional: Negative for unusual diaphoresis or night sweats HENT: Negative for ear swelling or discharge Eyes: Negative for worsening visual haziness  Respiratory: Negative for choking and stridor.   Gastrointestinal: Negative for distension or worsening eructation Genitourinary: Negative for retention or change in urine volume.  Musculoskeletal: Negative for other MSK pain or swelling Skin: Negative for color change and worsening wound Neurological: Negative for tremors and numbness other than noted  Psychiatric/Behavioral: Negative for decreased concentration or agitation other than above   All other system neg per pt    Objective:   Physical Exam BP 138/78   Pulse 78   Temp 98.7 F (37.1 C) (Oral)   Resp 20   Wt 175 lb (79.4 kg)   SpO2 94%   BMI 25.84 kg/m  VS noted, obese, not ill appearing Constitutional: Pt appears in no apparent distress HENT: Head: NCAT.  Right Ear: External ear normal.  Left Ear: External ear normal.  Eyes: . Pupils are equal, round, and reactive to light.  Conjunctivae and EOM are normal Neck: Normal range of motion. Neck supple.  Cardiovascular: Normal rate and regular rhythm.   Pulmonary/Chest: Effort normal and breath sounds without rales or wheezing.  Abd:  Soft, NT, ND, + BS Neurological: Pt is alert. Not confused , motor grossly intact Skin: Skin is warm. No rash, no LE edema Psychiatric: Pt behavior is normal. No agitation.  No other new exam findings    Assessment & Plan:

## 2016-02-25 NOTE — Patient Instructions (Signed)
Please continue all other medications as before, and refills have been done if requested.  Please have the pharmacy call with any other refills you may need.  Please continue your efforts at being more active, low cholesterol diet, and weight control.  You are otherwise up to date with prevention measures today.  Please keep your appointments with your specialists as you may have planned  Please go to the LAB in the Basement (turn left off the elevator) for the tests to be done tomorrow  You will be contacted by phone if any changes need to be made immediately.  Otherwise, you will receive a letter about your results with an explanation, but please check with MyChart first.  Please remember to sign up for MyChart if you have not done so, as this will be important to you in the future with finding out test results, communicating by private email, and scheduling acute appointments online when needed.  Please return in 6 months, or sooner if needed, with Lab testing done 3-5 days before  

## 2016-02-26 ENCOUNTER — Encounter: Payer: Self-pay | Admitting: Surgery

## 2016-02-26 ENCOUNTER — Other Ambulatory Visit (INDEPENDENT_AMBULATORY_CARE_PROVIDER_SITE_OTHER): Payer: BC Managed Care – PPO

## 2016-02-26 ENCOUNTER — Other Ambulatory Visit: Payer: Self-pay | Admitting: Internal Medicine

## 2016-02-26 ENCOUNTER — Encounter: Payer: Self-pay | Admitting: Internal Medicine

## 2016-02-26 DIAGNOSIS — E119 Type 2 diabetes mellitus without complications: Secondary | ICD-10-CM

## 2016-02-26 LAB — HEPATIC FUNCTION PANEL
ALT: 35 U/L (ref 0–35)
AST: 23 U/L (ref 0–37)
Albumin: 4.5 g/dL (ref 3.5–5.2)
Alkaline Phosphatase: 166 U/L — ABNORMAL HIGH (ref 39–117)
Bilirubin, Direct: 0.1 mg/dL (ref 0.0–0.3)
Total Bilirubin: 0.5 mg/dL (ref 0.2–1.2)
Total Protein: 7.6 g/dL (ref 6.0–8.3)

## 2016-02-26 LAB — BASIC METABOLIC PANEL
BUN: 13 mg/dL (ref 6–23)
CO2: 28 mEq/L (ref 19–32)
Calcium: 9.7 mg/dL (ref 8.4–10.5)
Chloride: 104 mEq/L (ref 96–112)
Creatinine, Ser: 0.71 mg/dL (ref 0.40–1.20)
GFR: 106.39 mL/min (ref >60.00)
Glucose, Bld: 189 mg/dL — ABNORMAL HIGH (ref 70–99)
Potassium: 4.2 mEq/L (ref 3.5–5.1)
Sodium: 140 mEq/L (ref 135–145)

## 2016-02-26 LAB — LIPID PANEL
Cholesterol: 166 mg/dL (ref 0–200)
HDL: 45 mg/dL (ref >39.00)
NonHDL: 121.46
Total CHOL/HDL Ratio: 4
Triglycerides: 347 mg/dL — ABNORMAL HIGH (ref 0.0–149.0)
VLDL: 69.4 mg/dL — ABNORMAL HIGH (ref 0.0–40.0)

## 2016-02-26 LAB — HEMOGLOBIN A1C: Hgb A1c MFr Bld: 8.7 % — ABNORMAL HIGH (ref 4.6–6.5)

## 2016-02-26 LAB — LDL CHOLESTEROL, DIRECT: Direct LDL: 65 mg/dL

## 2016-02-26 MED ORDER — GLIPIZIDE ER 10 MG PO TB24: 10.0000 mg | ORAL_TABLET | Freq: Every day | ORAL | 3 refills | Status: DC

## 2016-02-26 NOTE — Assessment & Plan Note (Signed)
stable overall by history and exam, recent data reviewed with pt, and pt to continue medical treatment as before,  to f/u any worsening symptoms or concerns Lab Results  Component Value Date   HGBA1C 8.7 (H) 02/26/2016

## 2016-02-26 NOTE — Assessment & Plan Note (Signed)
stable overall by history and exam, recent data reviewed with pt, and pt to continue medical treatment as before,  to f/u any worsening symptoms or concerns Lab Results  Component Value Date   LDLCALC 79 09/11/2012    

## 2016-02-26 NOTE — Assessment & Plan Note (Signed)
stable overall by history and exam, recent data reviewed with pt, and pt to continue medical treatment as before,  to f/u any worsening symptoms or concerns BP Readings from Last 3 Encounters:  02/25/16 138/78  12/31/15 134/70  09/29/15 130/72

## 2016-03-01 ENCOUNTER — Other Ambulatory Visit: Payer: Self-pay | Admitting: Internal Medicine

## 2016-03-02 ENCOUNTER — Encounter: Payer: Self-pay | Admitting: Surgery

## 2016-03-02 ENCOUNTER — Ambulatory Visit (INDEPENDENT_AMBULATORY_CARE_PROVIDER_SITE_OTHER): Payer: BC Managed Care – PPO | Admitting: Surgery

## 2016-03-02 VITALS — BP 146/86 | HR 69 | Temp 99.0°F | Resp 18 | Ht 69.0 in | Wt 173.8 lb

## 2016-03-02 DIAGNOSIS — I722 Aneurysm of renal artery: Secondary | ICD-10-CM | POA: Diagnosis not present

## 2016-03-02 NOTE — Progress Notes (Signed)
Vascular and Vein Specialist of [facility]  Patient name: Stacey [name] MRN: [id number] DOB: 11/20/1950 Sex: female   REASON FOR VISIT:    Follow up renal aneurysm  HISOTRY OF PRESENT ILLNESS:    Stacey [name] is a 66 y.o. 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  At that time it measured 9 mm.  It was located near the renal hilum.  Most recent study several years ago revealed that had changed to 1.2 cm.  She has no complaints today.  She has recently retired and is considering part-time work now   PAST MEDICAL HISTORY:   Past Medical History:  Diagnosis Date  . ANGIOEDEMA 03/07/2008   a. with ACE-I  . ANXIETY 09/16/2006  . ASTHMA 08/01/2006  . CAD (coronary artery disease)    a. 01/2010 : Minimal plaque at cardiac catheterization - CFX 20%, EF 70%;  b. 08/2012 Cath: Nl Cors, EF 65%.  . Chronic diastolic CHF (congestive heart failure) ([place]) 04/06/2010   a. In setting of HOCM.  Marland Kitchen Complete heart block ([place]) 02/26/2014   a. s/p MDT dual chamber pacemaker 02/2014  . COPD (chronic obstructive pulmonary disease) ([place])   . DEPRESSION 09/16/2006  . DIVERTICULOSIS, COLON, WITH HEMORRHAGE 04/06/2010  . DM (diabetes mellitus) in pregnancy, delivered w/postpartum condition 08/30/2010  . HYPERLIPIDEMIA 08/01/2006  . HYPERTENSION 08/01/2006  . 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 s/p septal myomectomy 4/12 at [place] with Dr. Evelina Dun;  b. echo 5/12: EF 60-65%, LVOT peak 18 mmHg; grade 1 diast dysfxn, mild SAM (improved since myomectomy;  c. 03/2012 Echo: EF 60-65%, mod-sev basal septal asymm hypertrophy, basal septal HK, LVOT grad 84mHg, Gr 1 DD, , SAM, Mild MR, nl RV, PASP 315mg; 08/2012 Echo: technically difficult, doubt LVOT obstruction, EF 60%, Gr 1 DD, mod-sev dil LA.  . Impaired glucose tolerance 06/25/2010  . LBBB (left bundle branch block) 06/17/2010  . OBESITY  09/16/2006  . Renal artery aneurysm ([facility]  . SICKLE CELL ANEMIA 08/01/2006  . Type II or unspecified type diabetes mellitus without mention of complication, uncontrolled 08/30/2010  . VENTRAL HERNIA 12/07/2006     FAMILY HISTORY:   Family History  Problem Relation Age of Onset  . Heart disease Father   . Heart attack Father   . Hypertension Father     before age 65. Colon cancer Daughter     and bleeding disorder  . Hyperlipidemia Daughter   . Heart disease Daughter   . Cancer Daughter   . Heart attack Sister   . Hyperlipidemia Sister   . Stroke Sister   . Hypertension Sister   . Hyperlipidemia Brother   . Hypertension Brother   . Diabetes Paternal Grandmother   . Clotting disorder Daughter   . Clotting disorder Other     granddaughter  . Hypertension Son     SOCIAL HISTORY:   Social History  Substance Use Topics  . Smoking status: Former Smoker    Packs/day: 0.25    Years: 20.00    Types: Cigarettes    Quit date: 01/24/1998  . Smokeless tobacco: Never Used     Comment: Quit smoking 2001. Smoked on and off for 20 years. Smoked 2-3 cigars daily  . Alcohol use No     ALLERGIES:   Allergies  Allergen Reactions  . Dairy Aid [Lactase] Diarrhea and Other (See Comments)    Bloating and gastric distress  .  Metformin And Related Other (See Comments)    GI upset  . Ace Inhibitors Other (See Comments)    REACTION: angioedema right eye  . Aspirin Other (See Comments)    Bleeding   . Codeine Other (See Comments)    Hallucinations, can take if with someone.  . Soy Allergy Other (See Comments)    Bleeding & cramps     CURRENT MEDICATIONS:   Current Outpatient Prescriptions  Medication Sig Dispense Refill  . albuterol (PROVENTIL HFA;VENTOLIN HFA) 108 (90 BASE) MCG/ACT inhaler Inhale 2 puffs into the lungs every 6 (six) hours as needed for wheezing or shortness of breath. 18 g 5  . amLODipine (NORVASC) 10 MG tablet TAKE 1 TABLET BY MOUTH EVERY DAY 90 tablet 1  .  atorvastatin (LIPITOR) 40 MG tablet Take 1 tablet (40 mg total) by mouth daily. 90 tablet 3  . Blood Glucose Monitoring Suppl (ONE TOUCH ULTRA 2) W/DEVICE KIT Use to check blood sugars daily Dx E11.9 1 each 0  . cetirizine (ZYRTEC) 10 MG tablet Take 10 mg by mouth daily.      . fenofibrate (TRICOR) 145 MG tablet TAKE 1 TABLET BY MOUTH DAILY 90 tablet 0  . fluticasone (FLONASE) 50 MCG/ACT nasal spray Place 1 spray into both nostrils daily.    Marland Kitchen glipiZIDE (GLUCOTROL XL) 10 MG 24 hr tablet Take 1 tablet (10 mg total) by mouth daily with breakfast. 90 tablet 3  . glucose blood test strip 1 each by Other route daily. Use to check blood sugar daily Dx e11.9 100 each 11  . irbesartan (AVAPRO) 300 MG tablet TAKE 1 TABLET(300 MG) BY MOUTH AT BEDTIME 90 tablet 3  . meclizine (ANTIVERT) 12.5 MG tablet Take 1 tablet (12.5 mg total) by mouth 3 (three) times daily as needed for dizziness. 40 tablet 1  . metoprolol succinate (TOPROL-XL) 100 MG 24 hr tablet TAKE 1 TABLET BY MOUTH EVERY DAY 90 tablet 1  . metroNIDAZOLE (FLAGYL) 500 MG tablet Take 1 tablet (500 mg total) by mouth 3 (three) times daily. 2130 tablet 0  . ondansetron (ZOFRAN) 4 MG tablet Take 1 tablet (4 mg total) by mouth every 8 (eight) hours as needed for nausea or vomiting. As needed for nausea or vomiting. 30 tablet 1  . ONETOUCH DELICA LANCETS 16X MISC USE TO HELP CHECK BLOOD SUGAR DAILY 200 each 0  . oxcarbazepine (TRILEPTAL) 600 MG tablet Take 600 mg by mouth 2 (two) times daily.    . potassium chloride (K-DUR) 10 MEQ tablet Take 1 tablet (10 mEq total) by mouth 2 (two) times daily. 90 tablet 11  . promethazine (PHENERGAN) 12.5 MG suppository Place 1 suppository (12.5 mg total) rectally every 6 (six) hours as needed for nausea or vomiting. 12 each 1  . topiramate (TOPAMAX) 100 MG tablet Take 100 mg by mouth daily.   0  . amLODipine (NORVASC) 10 MG tablet TAKE 1 TABLET BY MOUTH EVERY DAY (Patient not taking: Reported on 03/02/2016) 90 tablet 0    . metoprolol succinate (TOPROL-XL) 100 MG 24 hr tablet TAKE 1 TABLET BY MOUTH EVERY DAY (Patient not taking: Reported on 03/02/2016) 90 tablet 0  . metoprolol succinate (TOPROL-XL) 100 MG 24 hr tablet TAKE 1 TABLET BY MOUTH EVERY DAY (Patient not taking: Reported on 03/02/2016) 90 tablet 0  . oxcarbazepine (TRILEPTAL) 600 MG tablet Take 600 mg by mouth 2 (two) times daily.    . potassium chloride (K-DUR,KLOR-CON) 10 MEQ tablet TAKE 2 TABLETS BY MOUTH DAILY (Patient  not taking: Reported on 03/02/2016) 180 tablet 0  . potassium chloride (K-DUR,KLOR-CON) 10 MEQ tablet TAKE 2 TABLETS BY MOUTH DAILY (Patient not taking: Reported on 03/02/2016) 180 tablet 0  . potassium chloride (K-DUR,KLOR-CON) 10 MEQ tablet TAKE 2 TABLETS BY MOUTH DAILY (Patient not taking: Reported on 03/02/2016) 180 tablet 0   No current facility-administered medications for this visit.     REVIEW OF SYSTEMS:   _0  denotes positive finding, _1  denotes negative finding Cardiac  Comments:  Chest pain or chest pressure:    Shortness of breath upon exertion:    Short of breath when lying flat:    Irregular heart rhythm:        Vascular    Pain in calf, thigh, or hip brought on by ambulation:    Pain in feet at night that wakes you up from your sleep:     Blood clot in your veins:    Leg swelling:         Pulmonary    Oxygen at home:    Productive cough:     Wheezing:         Neurologic    Sudden weakness in arms or legs:     Sudden numbness in arms or legs:     Sudden onset of difficulty speaking or slurred speech:    Temporary loss of vision in one eye:     Problems with dizziness:         Gastrointestinal    Blood in stool:     Vomited blood:         Genitourinary    Burning when urinating:     Blood in urine:        Psychiatric    Major depression:         Hematologic    Bleeding problems:    Problems with blood clotting too easily:        Skin    Rashes or ulcers:        Constitutional    Fever or  chills:      PHYSICAL EXAM:   There were no vitals filed for this visit.  GENERAL: The patient is a well-nourished female, in no acute distress. The vital signs are documented above. CARDIAC: There is a regular rate and rhythm.  PULMONARY: Non-labored respirations ABDOMEN: Soft and non-tender with normal pitched bowel sounds.  MUSCULOSKELETAL: There are no major deformities or cyanosis. NEUROLOGIC: No focal weakness or paresthesias are detected. SKIN: There are no ulcers or rashes noted. PSYCHIATRIC: The patient has a normal affect.  STUDIES:   I have reviewed her CT scan with the following findings: VASCULAR  1. Stable 12 mm aneurysm at the bifurcation of the distal right main renal artery. Continued surveillance imaging recommended.  NON-VASCULAR  1. Right lower quadrant abdominal wall hernia involving small bowel without obstruction or strangulation. 2. Fatty liver 3. Sigmoid and descending diverticulosis  MEDICAL ISSUES:   There has been no significant change in the size of her right renal artery aneurysm for several years.  She will continue with surveillance however I think this can be spaced out.  I will have her come back and 3 years with a repeat CT anterior to the abdomen and pelvis    Annamarie Major, MD [facility] of [facility] [phone] Pager [phone]

## 2016-04-16 ENCOUNTER — Other Ambulatory Visit: Payer: Self-pay | Admitting: Internal Medicine

## 2016-08-03 ENCOUNTER — Other Ambulatory Visit: Payer: Self-pay | Admitting: Family Medicine

## 2016-08-03 DIAGNOSIS — H8110 Benign paroxysmal vertigo, unspecified ear: Secondary | ICD-10-CM

## 2016-08-04 ENCOUNTER — Other Ambulatory Visit: Payer: Self-pay | Admitting: Emergency Medicine

## 2016-08-04 DIAGNOSIS — H8110 Benign paroxysmal vertigo, unspecified ear: Secondary | ICD-10-CM

## 2016-08-04 MED ORDER — MECLIZINE HCL 12.5 MG PO TABS
12.5000 mg | ORAL_TABLET | Freq: Three times a day (TID) | ORAL | 0 refills | Status: DC | PRN
Start: 2016-08-04 — End: 2017-08-08

## 2016-08-04 NOTE — Telephone Encounter (Signed)
Received refill request for meclizine (ANTIVERT) 12.5 MG tablet. Pt last office visit and refill 06/08/15. Is it ok to refill? No upcoming appt's scheduled at this time.

## 2016-10-15 ENCOUNTER — Other Ambulatory Visit: Payer: Self-pay | Admitting: Internal Medicine

## 2016-10-26 ENCOUNTER — Ambulatory Visit (INDEPENDENT_AMBULATORY_CARE_PROVIDER_SITE_OTHER): Payer: PPO

## 2016-10-26 DIAGNOSIS — Z23 Encounter for immunization: Secondary | ICD-10-CM | POA: Diagnosis not present

## 2016-11-01 ENCOUNTER — Encounter: Payer: PPO | Admitting: Internal Medicine

## 2016-11-07 NOTE — Progress Notes (Deleted)
Cardiology Office Note Date:  11/07/2016  Patient ID:  Stacey [name], DOB [birth year], MRN [id number] PCP:  Biagio Borg, MD  Cardiologist:  Dr. Caryl Comes  ***refresh   Chief Complaint: routine device visit  History of Present Illness: Stacey [name] is a 66 y.o. female with history of HOCM s/p myomectomy at [facility], 2012, chronic CHF (diastolic), CHB w/PPM, COPD/asthma, HTN, HLD, renal artery aneurysm (follows with Dr. Trula Slade), DM, LBBB,   The patient comes today to be seen for Dr. Caryl Comes, last seen by him June 2107, at that time was doing well without changes to therapy made.  *** symptoms *** fluid status *** HOCM, ?syncope *** labs  Device information: MDT dual chamber PPM, implanted 02/26/14, Dr. Caryl Comes   Past Medical History:  Diagnosis Date  . ANGIOEDEMA 03/07/2008   a. with ACE-I  . ANXIETY 09/16/2006  . ASTHMA 08/01/2006  . CAD (coronary artery disease)    a. 01/2010 : Minimal plaque at cardiac catheterization - CFX 20%, EF 70%;  b. 08/2012 Cath: Nl Cors, EF 65%.  . Chronic diastolic CHF (congestive heart failure) ([place]) 04/06/2010   a. In setting of HOCM.  Marland Kitchen Complete heart block ([place]) 02/26/2014   a. s/p MDT dual chamber pacemaker 02/2014  . COPD (chronic obstructive pulmonary disease) ([place])   . DEPRESSION 09/16/2006  . DIVERTICULOSIS, COLON, WITH HEMORRHAGE 04/06/2010  . DM (diabetes mellitus) in pregnancy, delivered w/postpartum condition 08/30/2010  . HYPERLIPIDEMIA 08/01/2006  . HYPERTENSION 08/01/2006  . 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 s/p septal myomectomy 4/12 at [place] with Dr. Evelina Dun;  b. echo 5/12: EF 60-65%, LVOT peak 18 mmHg; grade 1 diast dysfxn, mild SAM (improved since myomectomy;  c. 03/2012 Echo: EF 60-65%, mod-sev basal septal asymm hypertrophy, basal septal HK, LVOT grad 69mHg, Gr 1 DD, , SAM, Mild MR, nl RV, PASP 393mg; 08/2012 Echo: technically difficult, doubt LVOT obstruction, EF 60%, Gr 1 DD, mod-sev dil LA.  . Impaired glucose  tolerance 06/25/2010  . LBBB (left bundle branch block) 06/17/2010  . OBESITY 09/16/2006  . Renal artery aneurysm ([facility] [place]  . SICKLE CELL ANEMIA 08/01/2006  . Type II or unspecified type diabetes mellitus without mention of complication, uncontrolled 08/30/2010  . VENTRAL HERNIA 12/07/2006    Past Surgical History:  Procedure Laterality Date  . ABDOMINAL HYSTERECTOMY  1987  . CARDIAC SURGERY    . ELECTROPHYSIOLOGY STUDY N/A 09/12/2012   Procedure: ELECTROPHYSIOLOGY STUDY;  Surgeon: StDeboraha SprangMD;  [facility];  Service: Cardiovascular;  Laterality: N/A;  . LEFT HEART CATH  Aug. 18, 2014   Medtronic heart device  . LEFT HEART CATHETERIZATION WITH CORONARY ANGIOGRAM N/A 09/10/2012   Procedure: LEFT HEART CATHETERIZATION WITH CORONARY ANGIOGRAM;  Surgeon: ChBurnell BlanksMD;  [facility];  Service: Cardiovascular;  Laterality: N/A;  . MYOMECTOMY     Septal  . PERMANENT PACEMAKER INSERTION N/A 02/26/2014   MDT Advisa dual chamber pacemaker implanted by Dr KlCaryl Comesor heart block  . VENTRAL HERNIA REPAIR  06/2006    Current Outpatient Prescriptions  Medication Sig Dispense Refill  . albuterol (PROVENTIL HFA;VENTOLIN HFA) 108 (90 BASE) MCG/ACT inhaler Inhale 2 puffs into the lungs every 6 (six) hours as needed for wheezing or shortness of breath. 18 g 5  . amLODipine (NORVASC) 10 MG tablet TAKE 1 TABLET BY MOUTH EVERY DAY 90 tablet 1  . amLODipine (NORVASC) 10 MG tablet TAKE 1 TABLET BY MOUTH EVERY DAY (Patient not taking:  Reported on 03/02/2016) 90 tablet 0  . amLODipine (NORVASC) 10 MG tablet TAKE 1 TABLET BY MOUTH EVERY DAY 90 tablet 3  . atorvastatin (LIPITOR) 40 MG tablet TAKE 1 TABLET(40 MG) BY MOUTH DAILY 90 tablet 1  . Blood Glucose Monitoring Suppl (ONE TOUCH ULTRA 2) W/DEVICE KIT Use to check blood sugars daily Dx E11.9 1 each 0  . cetirizine (ZYRTEC) 10 MG tablet Take 10 mg by mouth daily.      . fenofibrate (TRICOR) 145 MG tablet TAKE 1 TABLET BY MOUTH DAILY 90  tablet 0  . fluticasone (FLONASE) 50 MCG/ACT nasal spray Place 1 spray into both nostrils daily.    Marland Kitchen glipiZIDE (GLUCOTROL XL) 10 MG 24 hr tablet Take 1 tablet (10 mg total) by mouth daily with breakfast. 90 tablet 3  . glucose blood test strip 1 each by Other route daily. Use to check blood sugar daily Dx e11.9 100 each 11  . irbesartan (AVAPRO) 300 MG tablet TAKE 1 TABLET(300 MG) BY MOUTH AT BEDTIME 90 tablet 3  . meclizine (ANTIVERT) 12.5 MG tablet Take 1 tablet (12.5 mg total) by mouth 3 (three) times daily as needed for dizziness. 40 tablet 0  . metoprolol succinate (TOPROL-XL) 100 MG 24 hr tablet TAKE 1 TABLET BY MOUTH EVERY DAY 90 tablet 1  . metoprolol succinate (TOPROL-XL) 100 MG 24 hr tablet TAKE 1 TABLET BY MOUTH EVERY DAY (Patient not taking: Reported on 03/02/2016) 90 tablet 0  . metoprolol succinate (TOPROL-XL) 100 MG 24 hr tablet TAKE 1 TABLET BY MOUTH EVERY DAY (Patient not taking: Reported on 03/02/2016) 90 tablet 0  . metoprolol succinate (TOPROL-XL) 100 MG 24 hr tablet TAKE 1 TABLET BY MOUTH EVERY DAY 90 tablet 3  . metroNIDAZOLE (FLAGYL) 500 MG tablet Take 1 tablet (500 mg total) by mouth 3 (three) times daily. 2130 tablet 0  . ondansetron (ZOFRAN) 4 MG tablet Take 1 tablet (4 mg total) by mouth every 8 (eight) hours as needed for nausea or vomiting. As needed for nausea or vomiting. 30 tablet 1  . ONETOUCH DELICA LANCETS 46O MISC USE TO HELP CHECK BLOOD SUGAR DAILY 200 each 0  . oxcarbazepine (TRILEPTAL) 600 MG tablet Take 600 mg by mouth 2 (two) times daily.    . potassium chloride (K-DUR) 10 MEQ tablet Take 1 tablet (10 mEq total) by mouth 2 (two) times daily. 90 tablet 11  . potassium chloride (K-DUR,KLOR-CON) 10 MEQ tablet TAKE 2 TABLETS BY MOUTH DAILY (Patient not taking: Reported on 03/02/2016) 180 tablet 0  . potassium chloride (K-DUR,KLOR-CON) 10 MEQ tablet TAKE 2 TABLETS BY MOUTH DAILY (Patient not taking: Reported on 03/02/2016) 180 tablet 0  . potassium chloride  (K-DUR,KLOR-CON) 10 MEQ tablet TAKE 2 TABLETS BY MOUTH DAILY (Patient not taking: Reported on 03/02/2016) 180 tablet 0  . potassium chloride (K-DUR,KLOR-CON) 10 MEQ tablet TAKE 2 TABLETS BY MOUTH DAILY 180 tablet 3  . promethazine (PHENERGAN) 12.5 MG suppository Place 1 suppository (12.5 mg total) rectally every 6 (six) hours as needed for nausea or vomiting. 12 each 1  . topiramate (TOPAMAX) 100 MG tablet Take 100 mg by mouth daily.   0   No current facility-administered medications for this visit.     Allergies:   Dairy aid [lactase]; Metformin and related; Ace inhibitors; Aspirin; Codeine; and Soy allergy   Social History:  The patient  reports that she quit smoking about 18 years ago. Her smoking use included Cigarettes. She has a 5.00 pack-year smoking history. She  has never used smokeless tobacco. She reports that she does not drink alcohol or use drugs.   Family History:  The patient's family history includes Cancer in her daughter; Clotting disorder in her daughter and other; Colon cancer in her daughter; Diabetes in her paternal grandmother; Heart attack in her father and sister; Heart disease in her daughter and father; Hyperlipidemia in her brother, daughter, and sister; Hypertension in her brother, father, sister, and son; Stroke in her sister.  ROS:  Please see the history of present illness.  All other systems are reviewed and otherwise negative.   PHYSICAL EXAM: *** VS:  There were no vitals taken for this visit. BMI: There is no height or weight on file to calculate BMI. Well nourished, well developed, in no acute distress  HEENT: normocephalic, atraumatic  Neck: no JVD, carotid bruits or masses Cardiac:  *** RRR; no significant murmurs, no rubs, or gallops Lungs:  *** CTA b/l, no wheezing, rhonchi or rales  Abd: soft, nontender MS: no deformity or *** atrophy Ext: *** no edema  Skin: warm and dry, no rash Neuro:  No gross deficits appreciated Psych: euthymic mood, full  affect  *** PPM site is stable, no tethering or discomfort   EKG:  Done today and reviewed by myself shows *** PPM interrogation done today and reviewed by myself: ***  10/21/13: TTE Study Conclusions - Left ventricle: Wall thickness was increased in a pattern of severe LVH. There was moderate asymmetric hypertrophy. Systolic function was normal. The estimated ejection fraction was in the range of 55% to 60%. There was dynamic obstruction. Wall motion was normal; there were no regional wall motion abnormalities. Doppler parameters are consistent with abnormal left ventricular relaxation (grade 1 diastolic dysfunction). - Mitral valve: There was mild systolic anterior motion. There was moderate regurgitation. - Left atrium: The atrium was mildly dilated. - Right ventricle: The cavity size was mildly dilated. - Right atrium: The atrium was mildly dilated. Impressions - Their is HOCM with resting LVOT gradient of 43 which increases to 172 mmHg with Valsalva.   Recent Labs: 02/26/2016: ALT 35; BUN 13; Creatinine, Ser 0.71; Potassium 4.2; Sodium 140  02/26/2016: Cholesterol 166; Direct LDL 65.0; HDL 45.00; Total CHOL/HDL Ratio 4; Triglycerides 347.0; VLDL 69.4   CrCl cannot be calculated (Patient's most recent lab result is older than the maximum 21 days allowed.).   Wt Readings from Last 3 Encounters:  03/02/16 173 lb 12.8 oz (78.8 kg)  02/25/16 175 lb (79.4 kg)  09/29/15 175 lb (79.4 kg)     Other studies reviewed: Additional studies/records reviewed today include: summarized above  ASSESSMENT AND PLAN:  1. PPM     ***  2. HTN     ***  3. HOCM w/hx of myomectomy     ***  4. Chronic CHF (diastolic)     ***     Disposition: F/u with ***  Current medicines are reviewed at length with the patient today.  The patient did not have any concerns regarding medicines.***  Signed, Tommye Standard, PA-C 11/07/2016 8:47 PM     [address]  [place] [address] [phone] (office)  [phone] (fax)

## 2016-11-09 ENCOUNTER — Encounter: Payer: PPO | Admitting: Physician Assistant

## 2016-11-16 NOTE — Progress Notes (Signed)
Electrophysiology Office Note Date: 11/18/2016  ID:  RONNE SAVOIA, DOB 11/07/1950, MRN [id number]  PCP: Biagio Borg, MD Electrophysiologist: Caryl Comes  CC: Pacemaker follow-up  Courtne Lighty Crawford-Fewell is a 66 y.o. female seen today for Dr Caryl Comes.  She presents today for routine electrophysiology followup.  Since last being seen in our clinic, the patient reports doing reasonably well.  She is under significant stress with her husband's skin cancer diagnosis. They are travelling back and forth to [facility] for treatment.   She denies chest pain, palpitations, dyspnea, PND, orthopnea, nausea, vomiting, dizziness, syncope, edema, weight gain, or early satiety.  Device History: MDT dual chamber PPM implanted 2016 for CHB, HCM   Past Medical History:  Diagnosis Date  . ANGIOEDEMA 03/07/2008   a. with ACE-I  . ANXIETY 09/16/2006  . ASTHMA 08/01/2006  . CAD (coronary artery disease)    a. 01/2010 : Minimal plaque at cardiac catheterization - CFX 20%, EF 70%;  b. 08/2012 Cath: Nl Cors, EF 65%.  . Chronic diastolic CHF (congestive heart failure) ([place]) 04/06/2010   a. In setting of HOCM.  Marland Kitchen Complete heart block ([place]) 02/26/2014   a. s/p MDT dual chamber pacemaker 02/2014  . COPD (chronic obstructive pulmonary disease) ([place])   . DEPRESSION 09/16/2006  . DIVERTICULOSIS, COLON, WITH HEMORRHAGE 04/06/2010  . DM (diabetes mellitus) in pregnancy, delivered w/postpartum condition 08/30/2010  . HYPERLIPIDEMIA 08/01/2006  . HYPERTENSION 08/01/2006  . 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 s/p septal myomectomy 4/12 at [place] with Dr. Evelina Dun;  b. echo 5/12: EF 60-65%, LVOT peak 18 mmHg; grade 1 diast dysfxn, mild SAM (improved since myomectomy;  c. 03/2012 Echo: EF 60-65%, mod-sev basal septal asymm hypertrophy, basal septal HK, LVOT grad 80mHg, Gr 1 DD, , SAM, Mild MR, nl RV, PASP 387mg; 08/2012 Echo: technically difficult, doubt LVOT obstruction, EF 60%, Gr 1 DD, mod-sev dil LA.  . Impaired glucose  tolerance 06/25/2010  . LBBB (left bundle branch block) 06/17/2010  . OBESITY 09/16/2006  . Renal artery aneurysm ([facility] [place]  . SICKLE CELL ANEMIA 08/01/2006  . Type II or unspecified type diabetes mellitus without mention of complication, uncontrolled 08/30/2010  . VENTRAL HERNIA 12/07/2006   Past Surgical History:  Procedure Laterality Date  . ABDOMINAL HYSTERECTOMY  1987  . CARDIAC SURGERY    . ELECTROPHYSIOLOGY STUDY N/A 09/12/2012   Procedure: ELECTROPHYSIOLOGY STUDY;  Surgeon: StDeboraha SprangMD;  [facility];  Service: Cardiovascular;  Laterality: N/A;  . LEFT HEART CATH  Aug. 18, 2014   Medtronic heart device  . LEFT HEART CATHETERIZATION WITH CORONARY ANGIOGRAM N/A 09/10/2012   Procedure: LEFT HEART CATHETERIZATION WITH CORONARY ANGIOGRAM;  Surgeon: ChBurnell BlanksMD;  [facility];  Service: Cardiovascular;  Laterality: N/A;  . MYOMECTOMY     Septal  . PERMANENT PACEMAKER INSERTION N/A 02/26/2014   MDT Advisa dual chamber pacemaker implanted by Dr KlCaryl Comesor heart block  . VENTRAL HERNIA REPAIR  06/2006    Current Outpatient Prescriptions  Medication Sig Dispense Refill  . albuterol (PROVENTIL HFA;VENTOLIN HFA) 108 (90 BASE) MCG/ACT inhaler Inhale 2 puffs into the lungs every 6 (six) hours as needed for wheezing or shortness of breath. 18 g 5  . amLODipine (NORVASC) 10 MG tablet TAKE 1 TABLET BY MOUTH EVERY DAY 90 tablet 3  . atorvastatin (LIPITOR) 40 MG tablet TAKE 1 TABLET(40 MG) BY MOUTH DAILY 90 tablet 1  . Blood Glucose Monitoring Suppl (ONE TOUCH ULTRA 2)  W/DEVICE KIT Use to check blood sugars daily Dx E11.9 1 each 0  . cetirizine (ZYRTEC) 10 MG tablet Take 10 mg by mouth daily.      . fenofibrate (TRICOR) 145 MG tablet TAKE 1 TABLET BY MOUTH DAILY 90 tablet 0  . fluticasone (FLONASE) 50 MCG/ACT nasal spray Place 1 spray into both nostrils daily.    Marland Kitchen glipiZIDE (GLUCOTROL XL) 10 MG 24 hr tablet Take 1 tablet (10 mg total) by mouth daily with breakfast. 90  tablet 3  . glucose blood test strip 1 each by Other route daily. Use to check blood sugar daily Dx e11.9 100 each 11  . irbesartan (AVAPRO) 300 MG tablet TAKE 1 TABLET(300 MG) BY MOUTH AT BEDTIME 90 tablet 3  . meclizine (ANTIVERT) 12.5 MG tablet Take 1 tablet (12.5 mg total) by mouth 3 (three) times daily as needed for dizziness. 40 tablet 0  . metoprolol succinate (TOPROL-XL) 100 MG 24 hr tablet TAKE 1 TABLET BY MOUTH EVERY DAY 90 tablet 1  . metroNIDAZOLE (FLAGYL) 500 MG tablet Take 1 tablet (500 mg total) by mouth 3 (three) times daily. 2130 tablet 0  . ondansetron (ZOFRAN) 4 MG tablet Take 1 tablet (4 mg total) by mouth every 8 (eight) hours as needed for nausea or vomiting. As needed for nausea or vomiting. 30 tablet 1  . ONETOUCH DELICA LANCETS 95A MISC USE TO HELP CHECK BLOOD SUGAR DAILY 200 each 0  . oxcarbazepine (TRILEPTAL) 600 MG tablet Take 600 mg by mouth 2 (two) times daily.    . potassium chloride (K-DUR) 10 MEQ tablet Take 1 tablet (10 mEq total) by mouth 2 (two) times daily. 90 tablet 11  . topiramate (TOPAMAX) 100 MG tablet Take 100 mg by mouth daily.   0   No current facility-administered medications for this visit.     Allergies:   Dairy aid [lactase]; Metformin and related; Ace inhibitors; Aspirin; Codeine; and Soy allergy   Social History: Social History   Social History  . Marital status: Married    Spouse name: N/A  . Number of children: 3  . Years of education: N/A   Occupational History  . [place] History Main Topics  . Smoking status: Former Smoker    Packs/day: 0.25    Years: 20.00    Types: Cigarettes    Quit date: 01/24/1998  . Smokeless tobacco: Never Used     Comment: Quit smoking 2001. Smoked on and off for 20 years. Smoked 2-3 cigars daily  . Alcohol use No  . Drug use: No  . Sexual activity: Not on file   Other Topics Concern  . Not on file   Social History Narrative   Lives in [place] with  husband.  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    Family History: Family History  Problem Relation Age of Onset  . Heart disease Father   . Heart attack Father   . Hypertension Father        before age 6  . Colon cancer Daughter        and bleeding disorder  . Hyperlipidemia Daughter   . Heart disease Daughter   . Cancer Daughter   . Heart attack Sister   . Hyperlipidemia Sister   . Stroke Sister   . Hypertension Sister   . Hyperlipidemia Brother   . Hypertension Brother   . Diabetes Paternal Grandmother   . Clotting  disorder Daughter   . Clotting disorder Other        granddaughter  . Hypertension Son      Review of Systems: All other systems reviewed and are otherwise negative except as noted above.   Physical Exam: VS:  BP (!) 140/92   Pulse 65   Ht _0  (1.753 m)   Wt 173 lb 6.4 oz (78.7 kg)   BMI 25.61 kg/m  , BMI Body mass index is 25.61 kg/m.  GEN- The patient is well appearing, alert and oriented x 3 today.   HEENT: normocephalic, atraumatic; sclera clear, conjunctiva pink; hearing intact; oropharynx clear; neck supple  Lungs- Clear to ausculation bilaterally, normal work of breathing.  No wheezes, rales, rhonchi Heart- Regular rate and rhythm, 3/6 SEM  GI- soft, non-tender, non-distended, bowel sounds present  Extremities- no clubbing, cyanosis, or edema  MS- no significant deformity or atrophy Skin- warm and dry, no rash or lesion; PPM pocket well healed Psych- euthymic mood, full affect Neuro- strength and sensation are intact  PPM Interrogation- reviewed in detail today,  See PACEART report  EKG:  EKG is ordered today. The ekg ordered today shows sinus rhythm, LBBB  Recent Labs: 02/26/2016: ALT 35; BUN 13; Creatinine, Ser 0.71; Potassium 4.2; Sodium 140   Wt Readings from Last 3 Encounters:  11/18/16 173 lb 6.4 oz (78.7 kg)  03/02/16 173 lb 12.8 oz (78.8 kg)  02/25/16 175 lb (79.4 kg)     Other studies  Reviewed: Additional studies/ records that were reviewed today include: Dr Olin Pia office notes  Assessment and Plan:  1.  Complete heart block  Normal PPM function See Pace Art report No changes today Given [facility] Smart monitor today   2.  HCM s/p myectomy Stable No change required today   Current medicines are reviewed at length with the patient today.   The patient does not have concerns regarding her medicines.  The following changes were made today:  none  Labs/ tests ordered today include: none Orders Placed This Encounter  Procedures  . EKG 12-Lead     Disposition:   Follow up with [facility], Dr Caryl Comes 1 year    Signed, Chanetta Marshall, NP 11/18/2016 9:16 AM  [place] [facility] [place] [address] [phone] (office) [phone] (fax)

## 2016-11-18 ENCOUNTER — Ambulatory Visit (INDEPENDENT_AMBULATORY_CARE_PROVIDER_SITE_OTHER): Payer: PPO | Admitting: Nurse Practitioner

## 2016-11-18 ENCOUNTER — Encounter: Payer: Self-pay | Admitting: Nurse Practitioner

## 2016-11-18 VITALS — BP 140/92 | HR 65 | Ht 69.0 in | Wt 173.4 lb

## 2016-11-18 DIAGNOSIS — I442 Atrioventricular block, complete: Secondary | ICD-10-CM | POA: Diagnosis not present

## 2016-11-18 DIAGNOSIS — I421 Obstructive hypertrophic cardiomyopathy: Secondary | ICD-10-CM | POA: Diagnosis not present

## 2016-11-18 LAB — CUP PACEART INCLINIC DEVICE CHECK
Date Time Interrogation Session: 20181026092152
Implantable Lead Implant Date: 20160203
Implantable Lead Implant Date: 20160203
Implantable Lead Location: 753859
Implantable Lead Location: 753860
Implantable Lead Model: 5076
Implantable Lead Model: 5076
Implantable Pulse Generator Implant Date: 20160203

## 2016-11-18 NOTE — Patient Instructions (Addendum)
Medication Instructions:   Your physician recommends that you continue on your current medications as directed. Please refer to the Current Medication list given to you today.   If you need a refill on your cardiac medications before your next appointment, please call your pharmacy.  Labwork: NONE ORDERED  TODAY    Testing/Procedures: NONE ORDERED  TODAY    Follow-Up:  Your physician wants you to follow-up in: ONE YEAR WITH  Graciela HusbandsKLEIN  You will receive a reminder letter in the mail two months in advance. If you don't receive a letter, please call our office to schedule the follow-up appointment.     Remote monitoring is used to monitor your Pacemaker of ICD from home. This monitoring reduces the number of office visits required to check your device to one time per year. It allows us to keep an eye on the functioning of your device to ensure it is working properly. You are scheduled for a device check from home on .Marland Kitchen.[email or web address] may send your transmission at any time that day. If you have a wireless device, the transmission will be sent automatically. After your physician reviews your transmission, you will receive a postcard with your next transmission date.     Any Other Special Instructions Will Be Listed Below (If Applicable).

## 2016-12-20 ENCOUNTER — Telehealth: Payer: Self-pay | Admitting: Internal Medicine

## 2016-12-20 NOTE — Telephone Encounter (Signed)
Patient Name: Stacey [name]  DOB: 12/07/1950    Initial Comment Caller states she has blood in stool, enough of an amount, no pain, and wondering if this is related to Diverticulitis. There is an odor, more than normal, and not filling up toilet like it had been before.    Nurse Assessment  Nurse: Stefano GaulStringer, RN, Dwana CurdVera Date/Time (Eastern Time): 12/20/2016 8:28:09 AM  Confirm and document reason for call. If symptomatic, describe symptoms. ---Caller states she has blood in her stool every time she has a BM. History of diverticulitis. No diarrhea. Has blood in stools 2-3 times daily. No pain. No clots  Does the patient have any new or worsening symptoms? ---Yes  Will a triage be completed? ---Yes  Related visit to physician within the last 2 weeks? ---No  Does the PT have any chronic conditions? (i.e. diabetes, asthma, etc.) ---Yes  List chronic conditions. ---diabetes; diverticulitis  Is this a behavioral health or substance abuse call? ---No     Guidelines    Guideline Title Affirmed Question Affirmed Notes  Rectal Bleeding MODERATE rectal bleeding (small blood clots, passing blood without stool, or toilet water turns red)    Final Disposition User   See Physician within 24 Hours Stringer, RN, Vera    Comments  No appts available within 24 hrs with Dr. Oliver BarreJames John. Pt does not want to see another provider. She will be available after 3 pm today or tomorrow am.   Referrals  REFERRED TO PCP OFFICE   Caller Disagree/Comply Disagree  Caller Understands Yes  PreDisposition Call Doctor

## 2016-12-21 ENCOUNTER — Ambulatory Visit: Payer: PPO | Admitting: Family

## 2016-12-21 ENCOUNTER — Encounter (HOSPITAL_COMMUNITY): Payer: Self-pay | Admitting: Family Medicine

## 2016-12-21 ENCOUNTER — Emergency Department (HOSPITAL_COMMUNITY): Payer: PPO

## 2016-12-21 ENCOUNTER — Ambulatory Visit: Payer: Self-pay | Admitting: *Deleted

## 2016-12-21 ENCOUNTER — Encounter: Payer: Self-pay | Admitting: Family

## 2016-12-21 ENCOUNTER — Inpatient Hospital Stay (HOSPITAL_COMMUNITY)
Admission: EM | Admit: 2016-12-21 | Discharge: 2016-12-27 | DRG: 378 | Disposition: A | Discharge status: Home / Self Care | Payer: PPO | Attending: Internal Medicine | Admitting: Internal Medicine

## 2016-12-21 VITALS — BP 122/68 | HR 74 | Temp 98.6°F | Ht 69.0 in | Wt 171.0 lb

## 2016-12-21 DIAGNOSIS — R1032 Left lower quadrant pain: Secondary | ICD-10-CM | POA: Diagnosis not present

## 2016-12-21 DIAGNOSIS — Z950 Presence of cardiac pacemaker: Secondary | ICD-10-CM

## 2016-12-21 DIAGNOSIS — Z91018 Allergy to other foods: Secondary | ICD-10-CM | POA: Diagnosis not present

## 2016-12-21 DIAGNOSIS — D571 Sickle-cell disease without crisis: Secondary | ICD-10-CM | POA: Diagnosis not present

## 2016-12-21 DIAGNOSIS — E785 Hyperlipidemia, unspecified: Secondary | ICD-10-CM | POA: Diagnosis not present

## 2016-12-21 DIAGNOSIS — K922 Gastrointestinal hemorrhage, unspecified: Secondary | ICD-10-CM

## 2016-12-21 DIAGNOSIS — K921 Melena: Secondary | ICD-10-CM

## 2016-12-21 DIAGNOSIS — Z87891 Personal history of nicotine dependence: Secondary | ICD-10-CM | POA: Diagnosis not present

## 2016-12-21 DIAGNOSIS — I5032 Chronic diastolic (congestive) heart failure: Secondary | ICD-10-CM | POA: Diagnosis not present

## 2016-12-21 DIAGNOSIS — I110 Hypertensive heart disease with heart failure: Secondary | ICD-10-CM | POA: Diagnosis present

## 2016-12-21 DIAGNOSIS — F329 Major depressive disorder, single episode, unspecified: Secondary | ICD-10-CM | POA: Diagnosis not present

## 2016-12-21 DIAGNOSIS — Z800 Family history of malignant neoplasm of digestive organs: Secondary | ICD-10-CM | POA: Diagnosis not present

## 2016-12-21 DIAGNOSIS — R1030 Lower abdominal pain, unspecified: Secondary | ICD-10-CM | POA: Diagnosis not present

## 2016-12-21 DIAGNOSIS — Z91011 Allergy to milk products: Secondary | ICD-10-CM

## 2016-12-21 DIAGNOSIS — R195 Other fecal abnormalities: Secondary | ICD-10-CM | POA: Diagnosis not present

## 2016-12-21 DIAGNOSIS — Z885 Allergy status to narcotic agent status: Secondary | ICD-10-CM | POA: Diagnosis not present

## 2016-12-21 DIAGNOSIS — D509 Iron deficiency anemia, unspecified: Secondary | ICD-10-CM | POA: Diagnosis not present

## 2016-12-21 DIAGNOSIS — D682 Hereditary deficiency of other clotting factors: Secondary | ICD-10-CM | POA: Diagnosis not present

## 2016-12-21 DIAGNOSIS — I10 Essential (primary) hypertension: Secondary | ICD-10-CM | POA: Diagnosis not present

## 2016-12-21 DIAGNOSIS — I447 Left bundle-branch block, unspecified: Secondary | ICD-10-CM | POA: Diagnosis present

## 2016-12-21 DIAGNOSIS — J449 Chronic obstructive pulmonary disease, unspecified: Secondary | ICD-10-CM | POA: Diagnosis present

## 2016-12-21 DIAGNOSIS — Z90710 Acquired absence of both cervix and uterus: Secondary | ICD-10-CM

## 2016-12-21 DIAGNOSIS — K8020 Calculus of gallbladder without cholecystitis without obstruction: Secondary | ICD-10-CM | POA: Diagnosis not present

## 2016-12-21 DIAGNOSIS — D62 Acute posthemorrhagic anemia: Secondary | ICD-10-CM | POA: Diagnosis present

## 2016-12-21 DIAGNOSIS — I2510 Atherosclerotic heart disease of native coronary artery without angina pectoris: Secondary | ICD-10-CM | POA: Diagnosis present

## 2016-12-21 DIAGNOSIS — E119 Type 2 diabetes mellitus without complications: Secondary | ICD-10-CM

## 2016-12-21 DIAGNOSIS — K648 Other hemorrhoids: Secondary | ICD-10-CM | POA: Diagnosis present

## 2016-12-21 DIAGNOSIS — I5030 Unspecified diastolic (congestive) heart failure: Secondary | ICD-10-CM | POA: Diagnosis present

## 2016-12-21 DIAGNOSIS — Z9102 Food additives allergy status: Secondary | ICD-10-CM

## 2016-12-21 DIAGNOSIS — K625 Hemorrhage of anus and rectum: Secondary | ICD-10-CM | POA: Diagnosis not present

## 2016-12-21 DIAGNOSIS — I722 Aneurysm of renal artery: Secondary | ICD-10-CM | POA: Diagnosis not present

## 2016-12-21 DIAGNOSIS — K5731 Diverticulosis of large intestine without perforation or abscess with bleeding: Principal | ICD-10-CM | POA: Diagnosis present

## 2016-12-21 DIAGNOSIS — Z886 Allergy status to analgesic agent status: Secondary | ICD-10-CM | POA: Diagnosis not present

## 2016-12-21 DIAGNOSIS — I509 Heart failure, unspecified: Secondary | ICD-10-CM | POA: Diagnosis not present

## 2016-12-21 DIAGNOSIS — D649 Anemia, unspecified: Secondary | ICD-10-CM | POA: Diagnosis not present

## 2016-12-21 LAB — CBC WITH DIFFERENTIAL/PLATELET
Basophils Absolute: 0 10*3/uL (ref 0.0–0.1)
Basophils Relative: 0 %
Eosinophils Absolute: 0.1 10*3/uL (ref 0.0–0.7)
Eosinophils Relative: 2 %
HCT: 36.7 % (ref 36.0–46.0)
Hemoglobin: 11.9 g/dL — ABNORMAL LOW (ref 12.0–15.0)
Lymphocytes Relative: 34 %
Lymphs Abs: 2.5 10*3/uL (ref 0.7–4.0)
MCH: 25.1 pg — ABNORMAL LOW (ref 26.0–34.0)
MCHC: 32.4 g/dL (ref 30.0–36.0)
MCV: 77.4 fL — ABNORMAL LOW (ref 78.0–100.0)
Monocytes Absolute: 0.4 10*3/uL (ref 0.1–1.0)
Monocytes Relative: 5 %
Neutro Abs: 4.3 10*3/uL (ref 1.7–7.7)
Neutrophils Relative %: 59 %
Platelets: 199 10*3/uL (ref 150–400)
RBC: 4.74 MIL/uL (ref 3.87–5.11)
RDW: 14.7 % (ref 11.5–15.5)
WBC: 7.3 10*3/uL (ref 4.0–10.5)

## 2016-12-21 LAB — CBC
HCT: 38.4 % (ref 36.0–46.0)
Hemoglobin: 12.5 g/dL (ref 12.0–15.0)
MCH: 25.3 pg — ABNORMAL LOW (ref 26.0–34.0)
MCHC: 32.6 g/dL (ref 30.0–36.0)
MCV: 77.7 fL — ABNORMAL LOW (ref 78.0–100.0)
Platelets: 205 10*3/uL (ref 150–400)
RBC: 4.94 MIL/uL (ref 3.87–5.11)
RDW: 14.7 % (ref 11.5–15.5)
WBC: 7.5 10*3/uL (ref 4.0–10.5)

## 2016-12-21 LAB — POC OCCULT BLOOD, ED: Fecal Occult Bld: POSITIVE — AB

## 2016-12-21 LAB — COMPREHENSIVE METABOLIC PANEL
ALT: 29 U/L (ref 14–54)
AST: 27 U/L (ref 15–41)
Albumin: 4 g/dL (ref 3.5–5.0)
Alkaline Phosphatase: 161 U/L — ABNORMAL HIGH (ref 38–126)
Anion gap: 7 (ref 5–15)
BUN: 14 mg/dL (ref 6–20)
CO2: 24 mmol/L (ref 22–32)
Calcium: 9 mg/dL (ref 8.9–10.3)
Chloride: 108 mmol/L (ref 101–111)
Creatinine, Ser: 0.78 mg/dL (ref 0.44–1.00)
GFR calc Af Amer: >60 mL/min (ref >60)
GFR calc non Af Amer: >60 mL/min (ref >60)
Glucose, Bld: 169 mg/dL — ABNORMAL HIGH (ref 65–99)
Potassium: 4.2 mmol/L (ref 3.5–5.1)
Sodium: 139 mmol/L (ref 135–145)
Total Bilirubin: 0.5 mg/dL (ref 0.3–1.2)
Total Protein: 7.2 g/dL (ref 6.5–8.1)

## 2016-12-21 LAB — TYPE AND SCREEN
ABO/RH(D): O POS
Antibody Screen: NEGATIVE

## 2016-12-21 MED ORDER — SODIUM CHLORIDE 0.9 % IV BOLUS (SEPSIS)
1000.0000 mL | Freq: Once | INTRAVENOUS | Status: AC
  Administered 2016-12-21: 1000 mL via INTRAVENOUS

## 2016-12-21 NOTE — Progress Notes (Signed)
Stacey [name] is a 66 y.o. female with the following history as recorded in EpicCare:  Patient Active Problem List   Diagnosis Date Noted  . Acute gastroenteritis 12/31/2015  . N&V (nausea and vomiting) 09/29/2015  . Diarrhea 09/29/2015  . Abdominal mass, RLQ (right lower quadrant) 09/04/2015  . Encounter for well adult exam with abnormal findings 09/04/2015  . Wheezing 11/14/2014  . S/P myomectomy 04/08/2014  . Complete heart block ([place]) 02/26/2014  . LINQ recorder  02/26/2014  . Pacemaker-Medtronic  MRI compatible 02/26/2014  . Nocturnal hypoxemia 01/30/2014  . Pancreatitis 10/22/2013  . Odynophagia 10/22/2013  . Chest pain syndrome 10/21/2013  . Syncope 12/18/2012  . Loop recorder-LINQ 12/18/2012  . Palpitations 09/12/2012  . Midsternal chest pain 09/12/2012  . 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 Abdominal pain 07/24/2012  . Polyarthralgia 07/24/2012  . Angioedema 03/04/2012  . Varicose veins of lower extremities with other complications 40/97/3532  . Abnormal liver function test 09/07/2011  . 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 Trigeminal neuralgia 08/15/2011  . Vertigo 07/27/2011  . Diabetes ([place]) 08/30/2010  . Encounter for long-term (current) use of high-risk medication 08/30/2010  . Status post myomectomy 06/17/2010  . LBBB (left bundle branch block) 06/17/2010  . Chronic diastolic heart failure ([place]) 04/06/2010  . Diverticulosis of large intestine 04/06/2010  . DIVERTICULOSIS, COLON, WITH HEMORRHAGE 04/06/2010  . RECTAL BLEEDING 04/06/2010  . BRADYCARDIA 10/13/2009  . ALLERGIC RHINITIS 12/07/2006  . VENTRAL HERNIA 12/07/2006  . COLONIC POLYPS, HX OF 12/07/2006  . OBESITY 09/16/2006  . ANXIETY 09/16/2006  . DEPRESSION 09/16/2006  . Hyperlipidemia 08/01/2006  . Sickle cell disease ([place]) 08/01/2006  . Essential hypertension 08/01/2006  . 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 Asthma 08/01/2006    Current Outpatient  Medications  Medication Sig Dispense Refill  . albuterol (PROVENTIL HFA;VENTOLIN HFA) 108 (90 BASE) MCG/ACT inhaler Inhale 2 puffs into the lungs every 6 (six) hours as needed for wheezing or shortness of breath. 18 g 5  . amLODipine (NORVASC) 10 MG tablet TAKE 1 TABLET BY MOUTH EVERY DAY 90 tablet 3  . atorvastatin (LIPITOR) 40 MG tablet TAKE 1 TABLET(40 MG) BY MOUTH DAILY 90 tablet 1  . Blood Glucose Monitoring Suppl (ONE TOUCH ULTRA 2) W/DEVICE KIT Use to check blood sugars daily Dx E11.9 1 each 0  . cetirizine (ZYRTEC) 10 MG tablet Take 10 mg by mouth daily.      . fenofibrate (TRICOR) 145 MG tablet TAKE 1 TABLET BY MOUTH DAILY 90 tablet 0  . fluticasone (FLONASE) 50 MCG/ACT nasal spray Place 1 spray into both nostrils daily.    Marland Kitchen glipiZIDE (GLUCOTROL XL) 10 MG 24 hr tablet Take 1 tablet (10 mg total) by mouth daily with breakfast. 90 tablet 3  . glucose blood test strip 1 each by Other route daily. Use to check blood sugar daily Dx e11.9 100 each 11  . irbesartan (AVAPRO) 300 MG tablet TAKE 1 TABLET(300 MG) BY MOUTH AT BEDTIME 90 tablet 3  . meclizine (ANTIVERT) 12.5 MG tablet Take 1 tablet (12.5 mg total) by mouth 3 (three) times daily as needed for dizziness. 40 tablet 0  . metoprolol succinate (TOPROL-XL) 100 MG 24 hr tablet TAKE 1 TABLET BY MOUTH EVERY DAY 90 tablet 1  . ondansetron (ZOFRAN) 4 MG tablet Take 1 tablet (4 mg total) by mouth every 8 (eight) hours as needed for nausea or vomiting. As needed for nausea or vomiting. 30 tablet 1  . ONETOUCH DELICA LANCETS 99M  MISC USE TO HELP CHECK BLOOD SUGAR DAILY 200 each 0  . oxcarbazepine (TRILEPTAL) 600 MG tablet Take 600 mg by mouth 2 (two) times daily.    . potassium chloride (K-DUR) 10 MEQ tablet Take 1 tablet (10 mEq total) by mouth 2 (two) times daily. 90 tablet 11  . topiramate (TOPAMAX) 100 MG tablet Take 100 mg by mouth daily.   0   No current facility-administered medications for this visit.     Allergies: Dairy aid  [lactase]; Metformin and related; Ace inhibitors; Aspirin; Codeine; and Soy allergy  Past Medical History:  Diagnosis Date  . ANGIOEDEMA 03/07/2008   a. with ACE-I  . ANXIETY 09/16/2006  . ASTHMA 08/01/2006  . CAD (coronary artery disease)    a. 01/2010 : Minimal plaque at cardiac catheterization - CFX 20%, EF 70%;  b. 08/2012 Cath: Nl Cors, EF 65%.  . Chronic diastolic CHF (congestive heart failure) ([place]) 04/06/2010   a. In setting of HOCM.  Marland Kitchen Complete heart block ([place]) 02/26/2014   a. s/p MDT dual chamber pacemaker 02/2014  . COPD (chronic obstructive pulmonary disease) ([place])   . DEPRESSION 09/16/2006  . DIVERTICULOSIS, COLON, WITH HEMORRHAGE 04/06/2010  . DM (diabetes mellitus) in pregnancy, delivered w/postpartum condition 08/30/2010  . HYPERLIPIDEMIA 08/01/2006  . HYPERTENSION 08/01/2006  . 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 s/p septal myomectomy 4/12 at [place] with Dr. Evelina Dun;  b. echo 5/12: EF 60-65%, LVOT peak 18 mmHg; grade 1 diast dysfxn, mild SAM (improved since myomectomy;  c. 03/2012 Echo: EF 60-65%, mod-sev basal septal asymm hypertrophy, basal septal HK, LVOT grad 27mHg, Gr 1 DD, , SAM, Mild MR, nl RV, PASP 359mg; 08/2012 Echo: technically difficult, doubt LVOT obstruction, EF 60%, Gr 1 DD, mod-sev dil LA.  . Impaired glucose tolerance 06/25/2010  . LBBB (left bundle branch block) 06/17/2010  . OBESITY 09/16/2006  . Renal artery aneurysm ([place]  . SICKLE CELL ANEMIA 08/01/2006  . Type II or unspecified type diabetes mellitus without mention of complication, uncontrolled 08/30/2010  . VENTRAL HERNIA 12/07/2006    Past Surgical History:  Procedure Laterality Date  . ABDOMINAL HYSTERECTOMY  1987  . CARDIAC SURGERY    . ELECTROPHYSIOLOGY STUDY N/A 09/12/2012   Procedure: ELECTROPHYSIOLOGY STUDY;  Surgeon: StDeboraha SprangMD;  [facility];  Service: Cardiovascular;  Laterality: N/A;  . LEFT HEART CATH  Aug. 18, 2014   Medtronic heart device  . LEFT HEART CATHETERIZATION WITH CORONARY  ANGIOGRAM N/A 09/10/2012   Procedure: LEFT HEART CATHETERIZATION WITH CORONARY ANGIOGRAM;  Surgeon: ChBurnell BlanksMD;  [facility];  Service: Cardiovascular;  Laterality: N/A;  . MYOMECTOMY     Septal  . PERMANENT PACEMAKER INSERTION N/A 02/26/2014   MDT Advisa dual chamber pacemaker implanted by Dr KlCaryl Comesor heart block  . VENTRAL HERNIA REPAIR  06/2006    Family History  Problem Relation Age of Onset  . Heart disease Father   . Heart attack Father   . Hypertension Father        before age 65. Colon cancer Daughter        and bleeding disorder  . Hyperlipidemia Daughter   . Heart disease Daughter   . Cancer Daughter   . Heart attack Sister   . Hyperlipidemia Sister   . Stroke Sister   . Hypertension Sister   . Hyperlipidemia Brother   . Hypertension Brother   . Diabetes Paternal Grandmother   . Clotting disorder Daughter   .  Clotting disorder Other        granddaughter  . Hypertension Son     Social History   Tobacco Use  . Smoking status: Former Smoker    Packs/day: 0.25    Years: 20.00    Pack years: 5.00    Types: Cigarettes    Last attempt to quit: 01/24/1998    Years since quitting: 18.9  . Smokeless tobacco: Never Used  . Tobacco comment: Quit smoking 2001. Smoked on and off for 20 years. Smoked 2-3 cigars daily  Substance Use Topics  . Alcohol use: No    Alcohol/week: 0.0 oz    Subjective:  Patient presents with concerns for rectal bleeding x 5-6 days; she notes the blood is "dark" and notes the amount of bleeding does seem to be becoming more frequent. She does not feel that the blood looks "sticky" or "tarry" however. She has a history of diverticulitis that has required hospitalization in the past due to complications from bleeding. She denies any fever but is having lower abdominal pain ( both LLQ and RLQ). She does feel the pain is worsening. She also notes that she has been feeling slightly dizzy/ light-headed when changing positions in  the past 2 days. Her last colonoscopy was done in 2015 and repeat is due in 2020.  Objective:  Vitals:   12/21/16 1433  BP: 122/68  Pulse: 74  Temp: 98.6 F (37 C)  TempSrc: Oral  SpO2: 97%  Weight: 171 lb (77.6 kg)  Height: 5' 9"  (1.753 m)    General: Well developed, well nourished, in no acute distress  Skin : Warm and dry.  Head: Normocephalic and atraumatic  Lungs: Respirations unlabored; clear to auscultation bilaterally without wheeze, rales, rhonchi  CVS exam: normal rate, regular rhythm, murmur noted Abdomen: Soft; tender to palpation at LLQ and RLQ;  nondistended; normoactive bowel sounds; no masses or hepatosplenomegaly  Musculoskeletal: No deformities; no active joint inflammation  Extremities: No edema, cyanosis, clubbing  Vessels: Symmetric bilaterally  Neurologic: Alert and oriented; speech intact; face symmetrical; moves all extremities well; CNII-XII intact without focal deficit   Assessment:  1. Rectal bleeding   2. Lower abdominal pain     Plan:  Patient is seen late in the afternoon and am unable to get labs or imaging updated in timely manner; due to history of hospitalization for GI bleed as well as description of "dark blood" worsening, am concerned for another GI bleed; patient is referred to ER for further evaluation; she is in agreement with this treatment plan.   No Follow-up on file.  No orders of the defined types were placed in this encounter.   Requested Prescriptions    No prescriptions requested or ordered in this encounter

## 2016-12-21 NOTE — ED Triage Notes (Signed)
Patient called Dr. Jeannie DoneJohn James's after hours nurse line yesterday reporting she had blood in her stool, relating it to diverticulitis,. Also, reports an odor, more than normal, and having a "gurgling" in her abd. Today she was seen by Dr. Oliver BarreJohn James and referred to the emergency department for further evaluation and possible admission.

## 2016-12-21 NOTE — H&P (Signed)
History and Physical    Stacey [name] UJW:[id number] DOB: 07/16/1950 DOA: 12/21/2016  PCP: Biagio Borg, MD Consultants:  Caryl Comes - cardiology Patient coming from: Home - lives with husband; NOK: daughter, [phone]  Chief Complaint: BRBPR  HPI: Stacey [name] is a 66 y.o. female with medical history significant of sickle cell; renal artery aneurysm; HTN; HLD; diverticulosis; depression; COPD; pacemaker; COPD; chronic diastolic CHF; and CAD presenting with BRBPR.  She reports that she has "diverticulitis".  She started feeling bad on Friday, with bloody stools.  She had a "slight tinge" of pain "in the colon", the LLQ.  She has had bloody stools with each BM.  Even with just urinating, she would pass gas and small amounts of blood.  Bright red blood.  Last BM was at 1:30 today.  No fever.  Slight nausea, no emesis.  She felt a little woozy at times.  No SOB compared to her normal.     ED Course: BRBPR, bloating.  Bright red blood on rectal with small hemorrhoid not thought to be the problem.  Hgb slowly trending down.  GI to see in AM.    Review of Systems: As per HPI; otherwise review of systems reviewed and negative.   Ambulatory Status:  Ambulates without assistance  Past Medical History:  Diagnosis Date  . ANGIOEDEMA 03/07/2008   a. with ACE-I  . ANXIETY 09/16/2006  . ASTHMA 08/01/2006  . CAD (coronary artery disease)    a. 01/2010 : Minimal plaque at cardiac catheterization - CFX 20%, EF 70%;  b. 08/2012 Cath: Nl Cors, EF 65%.  . Chronic diastolic CHF (congestive heart failure) ([place]) 04/06/2010   a. In setting of HOCM.  Marland Kitchen Complete heart block ([place]) 02/26/2014   a. s/p MDT dual chamber pacemaker 02/2014  . COPD (chronic obstructive pulmonary disease) ([place])   . DEPRESSION 09/16/2006  . DIVERTICULOSIS, COLON, WITH HEMORRHAGE 04/06/2010  . DM (diabetes mellitus) in pregnancy, delivered w/postpartum condition 08/30/2010  . HYPERLIPIDEMIA 08/01/2006  . HYPERTENSION  08/01/2006  . 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 s/p septal myomectomy 4/12 at [facility] with Dr. Evelina Dun;  b. echo 5/12: EF 60-65%, LVOT peak 18 mmHg; grade 1 diast dysfxn, mild SAM (improved since myomectomy;  c. 03/2012 Echo: EF 60-65%, mod-sev basal septal asymm hypertrophy, basal septal HK, LVOT grad 22mHg, Gr 1 DD, , SAM, Mild MR, nl RV, PASP 398mg; 08/2012 Echo: technically difficult, doubt LVOT obstruction, EF 60%, Gr 1 DD, mod-sev dil LA.  . Impaired glucose tolerance 06/25/2010  . LBBB (left bundle branch block) 06/17/2010  . OBESITY 09/16/2006  . Renal artery aneurysm ([facility]  . SICKLE CELL ANEMIA 08/01/2006  . Type II or unspecified type diabetes mellitus without mention of complication, uncontrolled 08/30/2010  . VENTRAL HERNIA 12/07/2006    Past Surgical History:  Procedure Laterality Date  . ABDOMINAL HYSTERECTOMY  1987  . CARDIAC SURGERY    . ELECTROPHYSIOLOGY STUDY N/A 09/12/2012   Procedure: ELECTROPHYSIOLOGY STUDY;  Surgeon: StDeboraha SprangMD;  [facility];  Service: Cardiovascular;  Laterality: N/A;  . LEFT HEART CATH  Aug. 18, 2014   Medtronic heart device  . LEFT HEART CATHETERIZATION WITH CORONARY ANGIOGRAM N/A 09/10/2012   Procedure: LEFT HEART CATHETERIZATION WITH CORONARY ANGIOGRAM;  Surgeon: ChBurnell BlanksMD;  [facility];  Service: Cardiovascular;  Laterality: N/A;  . MYOMECTOMY     Septal  . PERMANENT PACEMAKER INSERTION N/A 02/26/2014   MDT Advisa dual chamber pacemaker  implanted by Dr Caryl Comes for heart block  . VENTRAL HERNIA REPAIR  06/2006    Social History   Socioeconomic History  . Marital status: Married    Spouse name: Not on file  . Number of children: 3  . Years of education: Not on file  . Highest education level: Not on file  Social Needs  . Financial resource strain: Not on file  . Food insecurity - worry: Not on file  . Food insecurity - inability: Not on file  . Transportation needs - medical: Not on file  .  Transportation needs - non-medical: Not on file  Occupational History  . 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 Smoking status: Former Smoker    Packs/day: 0.25    Years: 20.00    Pack years: 5.00    Types: Cigarettes    Last attempt to quit: 01/24/1998    Years since quitting: 18.9  . Smokeless tobacco: Never Used  . Tobacco comment: Quit smoking 2001. Smoked on and off for 20 years. Smoked 2-3 cigars daily  Substance and Sexual Activity  . Alcohol use: No    Alcohol/week: 0.0 oz  . Drug use: No  . Sexual activity: Not on file  Other Topics Concern  . Not on file  Social History Narrative   Lives in [place] with husband.  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    Allergies  Allergen Reactions  . Dairy Aid [Lactase] Diarrhea and Other (See Comments)    Bloating and gastric distress  . Metformin And Related Other (See Comments)    GI upset  . Ace Inhibitors Other (See Comments)    REACTION: angioedema right eye  . Aspirin Other (See Comments)    Bleeding   . Codeine Other (See Comments)    Hallucinations, can take if with someone.  . Soy Allergy Other (See Comments)    Bleeding & cramps    Family History  Problem Relation Age of Onset  . Heart disease Father   . Heart attack Father   . Hypertension Father        before age 43  . Colon cancer Daughter        and bleeding disorder  . Hyperlipidemia Daughter   . Heart disease Daughter   . Cancer Daughter   . Heart attack Sister   . Hyperlipidemia Sister   . Stroke Sister   . Hypertension Sister   . Hyperlipidemia Brother   . Hypertension Brother   . Diabetes Paternal Grandmother   . Clotting disorder Daughter   . Clotting disorder Other        granddaughter  . Hypertension Son     Prior to Admission medications   Medication Sig Start Date End Date Taking? Authorizing Provider  acetaminophen (TYLENOL) 500 MG tablet Take 500 mg by mouth daily as  needed.   Yes [provider]  albuterol (PROVENTIL HFA;VENTOLIN HFA) 108 (90 BASE) MCG/ACT inhaler Inhale 2 puffs into the lungs every 6 (six) hours as needed for wheezing or shortness of breath. 11/14/14  Yes Biagio Borg, MD  amLODipine (NORVASC) 10 MG tablet TAKE 1 TABLET BY MOUTH EVERY DAY 04/18/16  Yes Biagio Borg, MD  atorvastatin (LIPITOR) 40 MG tablet TAKE 1 TABLET(40 MG) BY MOUTH DAILY 10/17/16  Yes Biagio Borg, MD  cetirizine (ZYRTEC) 10 MG tablet Take 10 mg by mouth daily.     Yes  [provider]  fenofibrate (TRICOR) 145 MG tablet TAKE 1 TABLET BY MOUTH DAILY 05/25/15  Yes Biagio Borg, MD  glipiZIDE (GLUCOTROL XL) 10 MG 24 hr tablet Take 1 tablet (10 mg total) by mouth daily with breakfast. 02/26/16  Yes Biagio Borg, MD  irbesartan (AVAPRO) 300 MG tablet TAKE 1 TABLET(300 MG) BY MOUTH AT BEDTIME 03/01/16  Yes Biagio Borg, MD  meclizine (ANTIVERT) 12.5 MG tablet Take 1 tablet (12.5 mg total) by mouth 3 (three) times daily as needed for dizziness. 08/04/16  Yes Biagio Borg, MD  metoprolol succinate (TOPROL-XL) 100 MG 24 hr tablet TAKE 1 TABLET BY MOUTH EVERY DAY 04/13/15  Yes Biagio Borg, MD  ondansetron (ZOFRAN) 4 MG tablet Take 1 tablet (4 mg total) by mouth every 8 (eight) hours as needed for nausea or vomiting. As needed for nausea or vomiting. 12/31/15  Yes Biagio Borg, MD  oxcarbazepine (TRILEPTAL) 600 MG tablet Take 600 mg by mouth 2 (two) times daily.   Yes [provider]  potassium chloride (K-DUR) 10 MEQ tablet Take 1 tablet (10 mEq total) by mouth 2 (two) times daily. 02/27/14  Yes Barrett, Evelene Croon, PA-C  topiramate (TOPAMAX) 100 MG tablet Take 100 mg by mouth daily.  03/17/14  Yes [provider]  Blood Glucose Monitoring Suppl (ONE TOUCH ULTRA 2) W/DEVICE KIT Use to check blood sugars daily Dx E11.9 05/16/14   Biagio Borg, MD  glucose blood test strip 1 each by Other route daily. Use to check blood sugar daily Dx e11.9 05/16/14   Biagio Borg, MD  [facility] DELICA LANCETS 67Y MISC USE TO HELP CHECK BLOOD SUGAR DAILY 07/15/14   Biagio Borg, MD    Physical Exam: Vitals:   12/21/16 1551 12/21/16 1908 12/21/16 2101 12/21/16 2329  BP: 140/77 126/65 (!) 147/82 140/66  Pulse: 66 60 61 61  Resp: _0 Temp: 98.6 F (37 C)  97.8 F (36.6 C) 98.5 F (36.9 C)  TempSrc: Oral  Oral Oral  SpO2: 95% 96% 99% 97%  Weight:      Height:         General:  Appears calm and comfortable and is NAD Eyes:  PERRL, EOMI, normal lids, iris ENT:  grossly normal hearing, lips & tongue, mmm Neck:  no LAD, masses or thyromegaly Cardiovascular:  RRR, no m/r/g. No LE edema.  Respiratory:   CTA bilaterally with no wheezes/rales/rhonchi.  Normal respiratory effort. Abdomen:  Soft, mildly diffusely TTP, ND, NABS Skin:  no rash or induration seen on limited exam Musculoskeletal:  grossly normal tone BUE/BLE, good ROM, no bony abnormality Psychiatric:  grossly normal mood and affect, speech fluent and appropriate, AOx3 Neurologic:  CN 2-12 grossly intact, moves all extremities in coordinated fashion, sensation intact    Radiological Exams on Admission: Dg Abd Acute W/chest  Result Date: 12/21/2016 CLINICAL DATA:  Pt states she is having a diverticulitis flare up. Pt c/o of nausea, abdominal distension, and abdominal pain. No hx of surgeries. Denies constipation and diarrhea. EXAM: DG ABDOMEN ACUTE W/ 1V CHEST COMPARISON:  02/27/2014 FINDINGS: Patient's left-sided transvenous pacer with leads to the right atrium and right ventricle. Status post median sternotomy. The heart is normal in size. There are no focal consolidations or pleural effusions. There is stable focal pleural thickening at the left lung apex since 2014, consistent with benign cause. Bowel gas pattern is nonobstructive. There is a large amount of stool throughout  the colon. No abnormal calcifications or evidence for organomegaly. IMPRESSION: 1.  No evidence for acute  cardiopulmonary abnormality. 2. Large stool burden. Electronically Signed   By: Nolon Nations M.D.   On: 12/21/2016 19:22    EKG:  Not done   Labs on Admission: I have personally reviewed the available labs and imaging studies at the time of the admission.  Pertinent labs:   Glucose 169; A1c 8.7 in 2/18 Hgb 12.5 -> 11.9 from 1551 to 1825 tonight; 13.1 in 8/17 Heme positive   Assessment/Plan Principal Problem:   GI bleeding Active Problems:   Essential hypertension   Chronic diastolic heart failure (HCC)   Diabetes (HCC)   Pacemaker-Medtronic  MRI compatible   GI bleeding -Differential to include diverticular bleed (h/o in the past, seems most likely etiology), bleeding hemorrhoids (given patient's history of hemorrhoids and positive finding of small hemorrhoid on physical exam - but this would be an excessive amount of bleeding for such a small hemorrhoid), bacterial infectious colitis (seems less likely),and AVM (albought the patient denies massive bleeding and her vitals are not consistent with massive acute blood loss).  -Last colonscopy was in 10/15 and showed severe diverticulosis in the right and left colon. -She is afebrile at this time with no tachycardia and no leukocytosis; will not give antibiotics at this time.  -Inpatient Observation  -CBC q6h; transfuse for Hgb <7 -Continue to monitory for recurrent bleeding  -Clear liquids -GI to see in AM  HTN -Continue Norvasc, Avapro, Toprol  DM -Will check A1c -hold Glucotrol -Cover with moderate-scale SSI  HFpEF -Appears compensated at this time -Will be judicious with IVF -Last echo was in 9/15 and showed preserved EF and grade 1 diastolic dysfunction  Pacemaker -Placed for complete heart block -Appears to be working appropriately -It is MRI compatible if MRI is thought to be needed  Note: she reports taking Topamax and Trileptal for a neurologic headache syndrome.  She denies h/o seizures.   DVT  prophylaxis:  SCDs Code Status:  Full - confirmed with patient Family Communication: None present  Disposition Plan:  Home once clinically improved Consults called: GI  Admission status: It is my clinical opinion that referral for OBSERVATION is reasonable and necessary in this patient based on the above information provided. The aforementioned taken together are felt to place the patient at high risk for further clinical deterioration. However it is anticipated that the patient may be medically stable for discharge from the hospital within 24 to 48 hours.     Karmen Bongo MD [facility]  If note is complete, please contact covering daytime or nighttime physician. [email or web address] Password [email or web address]  12/22/2016, 1:42 AM

## 2016-12-21 NOTE — Telephone Encounter (Signed)
Per after hours RN note from 12/20/16, "Caller states she has blood in stool, enough of an amount, no pain, and wondering if this is related to Diverticulitis. There is an odor, more than normal, and not filling up toilet like it had been before." Today pt states that last night she started having cramps; pt is still  Having "Gurgling, different odor of stool, and gas"  Reason for Disposition . MODERATE rectal bleeding (small blood clots, passing blood without stool, or toilet water turns red)  Answer Assessment - Initial Assessment Questions 1. APPEARANCE of BLOOD: "What color is it?" "Is it passed separately, on the surface of the stool, or mixed in with the stool?"      Medium red, mixed stool and separated  2. AMOUNT: "How much blood was passed?"      Not collecting in underwear yet; unable to quantify 3. FREQUENCY: "How many times has blood been passed with the stools?"      Every time 4. ONSET: "When was the blood first seen in the stools?" (Days or weeks)     Saturday 12/19/16 5. DIARRHEA: "Is there also some diarrhea?" If so, ask: "How many diarrhea stools were passed in past 24 hours?"      no 6. CONSTIPATION: "Do you have constipation?" If so, "How bad is it?"     no 7. RECURRENT SYMPTOMS: "Have you had blood in your stools before?" If so, ask: "When was the last time?" and "What happened that time?"      Yes when having diverticulitis 2-3 years 8. BLOOD THINNERS: "Do you take any blood thinners?" (e.g., Coumadin/warfarin, Pradaxa/dabigatran, aspirin)     no 9. OTHER SYMPTOMS: "Do you have any other symptoms?"  (e.g., abdominal pain, vomiting, dizziness, fever)     Cramping, nausea 10. PREGNANCY: "Is there any chance you are pregnant?" "When was your last menstrual period?"       no  Protocols used: RECTAL BLEEDING-A-AH

## 2016-12-21 NOTE — ED Notes (Signed)
Patient transported to X-ray 

## 2016-12-21 NOTE — ED Provider Notes (Signed)
[facility] DEPT Provider Note   CSN: [id number] Arrival date & time: 12/21/16  1533     History   Chief Complaint Chief Complaint  Patient presents with  . Rectal Bleeding    HPI Stacey [name] is a 66 y.o. female history of CAD, hypertension, hyperlipidemia, renal artery aneurysm, diverticulosis here presenting with rectal bleeding.  Patient states that for the last 5 days she had some blood in her stool when she wipes.  She states that every time she urinates she noticed blood when she wipes as well.  She states that her stool has been darker but denies any clots in her stool. She had 5 episodes of bright red blood today so she came to the ED. Denies any melena.  She states that she feels bloated but denies any abdominal pain or vomiting or fevers.  She had previous colonoscopy that shows diverticulosis and is not currently on blood thinners. Went to see Dr. [name], PCP today, and sent for evaluation.   The history is provided by the patient.    Past Medical History:  Diagnosis Date  . ANGIOEDEMA 03/07/2008   a. with ACE-I  . ANXIETY 09/16/2006  . ASTHMA 08/01/2006  . CAD (coronary artery disease)    a. 01/2010 : Minimal plaque at cardiac catheterization - CFX 20%, EF 70%;  b. 08/2012 Cath: Nl Cors, EF 65%.  . Chronic diastolic CHF (congestive heart failure) ([place]) 04/06/2010   a. In setting of HOCM.  Marland Kitchen Complete heart block ([place]) 02/26/2014   a. s/p MDT dual chamber pacemaker 02/2014  . COPD (chronic obstructive pulmonary disease) ([place])   . DEPRESSION 09/16/2006  . DIVERTICULOSIS, COLON, WITH HEMORRHAGE 04/06/2010  . DM (diabetes mellitus) in pregnancy, delivered w/postpartum condition 08/30/2010  . HYPERLIPIDEMIA 08/01/2006  . HYPERTENSION 08/01/2006  . 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 s/p septal myomectomy 4/12 at [facility] with Dr. Evelina Dun;  b. echo 5/12: EF 60-65%, LVOT peak 18 mmHg; grade 1 diast dysfxn, mild SAM (improved since myomectomy;  c.  03/2012 Echo: EF 60-65%, mod-sev basal septal asymm hypertrophy, basal septal HK, LVOT grad 97mHg, Gr 1 DD, , SAM, Mild MR, nl RV, PASP 353mg; 08/2012 Echo: technically difficult, doubt LVOT obstruction, EF 60%, Gr 1 DD, mod-sev dil LA.  . Impaired glucose tolerance 06/25/2010  . LBBB (left bundle branch block) 06/17/2010  . OBESITY 09/16/2006  . Renal artery aneurysm ([facility]  . SICKLE CELL ANEMIA 08/01/2006  . Type II or unspecified type diabetes mellitus without mention of complication, uncontrolled 08/30/2010  . VENTRAL HERNIA 12/07/2006    Patient Active Problem List   Diagnosis Date Noted  . Acute gastroenteritis 12/31/2015  . N&V (nausea and vomiting) 09/29/2015  . Diarrhea 09/29/2015  . Abdominal mass, RLQ (right lower quadrant) 09/04/2015  . Encounter for well adult exam with abnormal findings 09/04/2015  . Wheezing 11/14/2014  . S/P myomectomy 04/08/2014  . Complete heart block ([facility]02/03/2014  . LINQ recorder  02/26/2014  . Pacemaker-Medtronic  MRI compatible 02/26/2014  . Nocturnal hypoxemia 01/30/2014  . Pancreatitis 10/22/2013  . Odynophagia 10/22/2013  . Chest pain syndrome 10/21/2013  . Syncope 12/18/2012  . Loop recorder-LINQ 12/18/2012  . Palpitations 09/12/2012  . Midsternal chest pain 09/12/2012  . 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 Abdominal pain 07/24/2012  . Polyarthralgia 07/24/2012  . Angioedema 03/04/2012  . Varicose veins of lower extremities with other complications 1076/[id number]. Abnormal liver function test 09/07/2011  . Renal artery aneurysm ([facility]  09/07/2011  . Trigeminal neuralgia 08/15/2011  . Vertigo 07/27/2011  . Diabetes ([place]) 08/30/2010  . Encounter for long-term (current) use of high-risk medication 08/30/2010  . Status post myomectomy 06/17/2010  . LBBB (left bundle branch block) 06/17/2010  . Chronic diastolic heart failure ([place]) 04/06/2010  . Diverticulosis of large intestine 04/06/2010  . DIVERTICULOSIS, COLON, WITH HEMORRHAGE 04/06/2010  .  RECTAL BLEEDING 04/06/2010  . BRADYCARDIA 10/13/2009  . ALLERGIC RHINITIS 12/07/2006  . VENTRAL HERNIA 12/07/2006  . COLONIC POLYPS, HX OF 12/07/2006  . OBESITY 09/16/2006  . ANXIETY 09/16/2006  . DEPRESSION 09/16/2006  . Hyperlipidemia 08/01/2006  . Sickle cell disease ([place]) 08/01/2006  . Essential hypertension 08/01/2006  . Hypertrophic obstructive cardiomyopathy ([place]) 08/01/2006  . Asthma 08/01/2006    Past Surgical History:  Procedure Laterality Date  . ABDOMINAL HYSTERECTOMY  1987  . CARDIAC SURGERY    . ELECTROPHYSIOLOGY STUDY N/A 09/12/2012   Procedure: ELECTROPHYSIOLOGY STUDY;  Surgeon: Deboraha Sprang, MD;  Location: [facility] CATH LAB;  Service: Cardiovascular;  Laterality: N/A;  . LEFT HEART CATH  Aug. 18, 2014   Medtronic heart device  . LEFT HEART CATHETERIZATION WITH CORONARY ANGIOGRAM N/A 09/10/2012   Procedure: LEFT HEART CATHETERIZATION WITH CORONARY ANGIOGRAM;  Surgeon: Burnell Blanks, MD;  Location: [facility] CATH LAB;  Service: Cardiovascular;  Laterality: N/A;  . MYOMECTOMY     Septal  . PERMANENT PACEMAKER INSERTION N/A 02/26/2014   MDT Advisa dual chamber pacemaker implanted by Dr Caryl Comes for heart block  . VENTRAL HERNIA REPAIR  06/2006    OB History    No data available       Home Medications    Prior to Admission medications   Medication Sig Start Date End Date Taking? Authorizing Provider  acetaminophen (TYLENOL) 500 MG tablet Take 500 mg by mouth daily as needed.   Yes [provider]  albuterol (PROVENTIL HFA;VENTOLIN HFA) 108 (90 BASE) MCG/ACT inhaler Inhale 2 puffs into the lungs every 6 (six) hours as needed for wheezing or shortness of breath. 11/14/14  Yes Biagio Borg, MD  amLODipine (NORVASC) 10 MG tablet TAKE 1 TABLET BY MOUTH EVERY DAY 04/18/16  Yes Biagio Borg, MD  atorvastatin (LIPITOR) 40 MG tablet TAKE 1 TABLET(40 MG) BY MOUTH DAILY 10/17/16  Yes Biagio Borg, MD  cetirizine (ZYRTEC) 10 MG tablet Take 10 mg by mouth daily.     Yes  [provider]  fenofibrate (TRICOR) 145 MG tablet TAKE 1 TABLET BY MOUTH DAILY 05/25/15  Yes Biagio Borg, MD  glipiZIDE (GLUCOTROL XL) 10 MG 24 hr tablet Take 1 tablet (10 mg total) by mouth daily with breakfast. 02/26/16  Yes Biagio Borg, MD  irbesartan (AVAPRO) 300 MG tablet TAKE 1 TABLET(300 MG) BY MOUTH AT BEDTIME 03/01/16  Yes Biagio Borg, MD  meclizine (ANTIVERT) 12.5 MG tablet Take 1 tablet (12.5 mg total) by mouth 3 (three) times daily as needed for dizziness. 08/04/16  Yes Biagio Borg, MD  metoprolol succinate (TOPROL-XL) 100 MG 24 hr tablet TAKE 1 TABLET BY MOUTH EVERY DAY 04/13/15  Yes Biagio Borg, MD  ondansetron (ZOFRAN) 4 MG tablet Take 1 tablet (4 mg total) by mouth every 8 (eight) hours as needed for nausea or vomiting. As needed for nausea or vomiting. 12/31/15  Yes Biagio Borg, MD  oxcarbazepine (TRILEPTAL) 600 MG tablet Take 600 mg by mouth 2 (two) times daily.   Yes [provider]  potassium chloride (K-DUR) 10 MEQ  tablet Take 1 tablet (10 mEq total) by mouth 2 (two) times daily. 02/27/14  Yes Barrett, Evelene Croon, PA-C  topiramate (TOPAMAX) 100 MG tablet Take 100 mg by mouth daily.  03/17/14  Yes [provider]  Blood Glucose Monitoring Suppl (ONE TOUCH ULTRA 2) W/DEVICE KIT Use to check blood sugars daily Dx E11.9 05/16/14   Biagio Borg, MD  glucose blood test strip 1 each by Other route daily. Use to check blood sugar daily Dx e11.9 05/16/14   Biagio Borg, MD  [facility] DELICA LANCETS 51Z MISC USE TO HELP CHECK BLOOD SUGAR DAILY 07/15/14   Biagio Borg, MD    Family History Family History  Problem Relation Age of Onset  . Heart disease Father   . Heart attack Father   . Hypertension Father        before age 57  . Colon cancer Daughter        and bleeding disorder  . Hyperlipidemia Daughter   . Heart disease Daughter   . Cancer Daughter   . Heart attack Sister   . Hyperlipidemia Sister   . Stroke Sister   . Hypertension Sister   .  Hyperlipidemia Brother   . Hypertension Brother   . Diabetes Paternal Grandmother   . Clotting disorder Daughter   . Clotting disorder Other        granddaughter  . Hypertension Son     Social History Social History   Tobacco Use  . Smoking status: Former Smoker    Packs/day: 0.25    Years: 20.00    Pack years: 5.00    Types: Cigarettes    Last attempt to quit: 01/24/1998    Years since quitting: 18.9  . Smokeless tobacco: Never Used  . Tobacco comment: Quit smoking 2001. Smoked on and off for 20 years. Smoked 2-3 cigars daily  Substance Use Topics  . Alcohol use: No    Alcohol/week: 0.0 oz  . Drug use: No     Allergies   Dairy aid [lactase]; Metformin and related; Ace inhibitors; Aspirin; Codeine; and Soy allergy   Review of Systems Review of Systems  Gastrointestinal: Positive for blood in stool and hematochezia.  All other systems reviewed and are negative.    Physical Exam Updated Vital Signs BP (!) 147/82 (BP Location: Left Arm)   Pulse 61   Temp 97.8 F (36.6 C) (Oral)   Resp 16   Ht _0  (1.753 m)   Wt 77.6 kg (171 lb)   SpO2 99%   BMI 25.25 kg/m   Physical Exam  Constitutional: She is oriented to person, place, and time. She appears well-developed.  HENT:  Head: Normocephalic.  Mouth/Throat: Oropharynx is clear and moist.  Eyes: Conjunctivae and EOM are normal. Pupils are equal, round, and reactive to light.  Neck: Normal range of motion. Neck supple.  Cardiovascular: Normal rate, regular rhythm and normal heart sounds.  Pulmonary/Chest: Effort normal and breath sounds normal. No stridor. No respiratory distress.  Abdominal: Soft. Bowel sounds are normal.  Distended, nontender   Genitourinary:  Genitourinary Comments: Rectal- small external hemorrhoid, some blood on the glove   Musculoskeletal: Normal range of motion.  Neurological: She is alert and oriented to person, place, and time.  Skin: Skin is warm.  Psychiatric: She has a normal mood  and affect.  Nursing note and vitals reviewed.    ED Treatments / Results  Labs (all labs ordered are listed, but only abnormal results are displayed) Labs  Reviewed  COMPREHENSIVE METABOLIC PANEL - Abnormal; Notable for the following components:      Result Value   Glucose, Bld 169 (*)    Alkaline Phosphatase 161 (*)    All other components within normal limits  CBC - Abnormal; Notable for the following components:   MCV 77.7 (*)    MCH 25.3 (*)    All other components within normal limits  CBC WITH DIFFERENTIAL/PLATELET - Abnormal; Notable for the following components:   Hemoglobin 11.9 (*)    MCV 77.4 (*)    MCH 25.1 (*)    All other components within normal limits  POC OCCULT BLOOD, ED - Abnormal; Notable for the following components:   Fecal Occult Bld POSITIVE (*)    All other components within normal limits  TYPE AND SCREEN    EKG  EKG Interpretation None       Radiology Dg Abd Acute W/chest  Result Date: 12/21/2016 CLINICAL DATA:  Pt states she is having a diverticulitis flare up. Pt c/o of nausea, abdominal distension, and abdominal pain. No hx of surgeries. Denies constipation and diarrhea. EXAM: DG ABDOMEN ACUTE W/ 1V CHEST COMPARISON:  02/27/2014 FINDINGS: Patient's left-sided transvenous pacer with leads to the right atrium and right ventricle. Status post median sternotomy. The heart is normal in size. There are no focal consolidations or pleural effusions. There is stable focal pleural thickening at the left lung apex since 2014, consistent with benign cause. Bowel gas pattern is nonobstructive. There is a large amount of stool throughout the colon. No abnormal calcifications or evidence for organomegaly. IMPRESSION: 1.  No evidence for acute cardiopulmonary abnormality. 2. Large stool burden. Electronically Signed   By: Nolon Nations M.D.   On: 12/21/2016 19:22    Procedures Procedures (including critical care time)  Medications Ordered in  ED Medications  sodium chloride 0.9 % bolus 1,000 mL (1,000 mLs Intravenous New Bag/Given 12/21/16 2036)     Initial Impression / Assessment and Plan / ED Course  I have reviewed the triage vital signs and the nursing notes.  Pertinent labs & imaging results that were available during my care of the patient were reviewed by me and considered in my medical decision making (see chart for details).     Chauncey Bruno [name] is a 66 y.o. female here with rectal bleeding. Hx of diverticulosis on CT. Will get labs, acute abdominal series.   9:33 PM Initial CBC 12.5, repeat 11.9. Had another episode of bright red blood in the ED. I called Dr. Silverio Decamp from [facility], who will see patient in AM. States that clears are ok, repeat CBC in AM. Hospitalist to admit.      Final Clinical Impressions(s) / ED Diagnoses   Final diagnoses:  None    ED Discharge Orders    None       Drenda Freeze, MD 12/21/16 2137

## 2016-12-21 NOTE — ED Notes (Signed)
2x unsuccessful IV

## 2016-12-22 ENCOUNTER — Other Ambulatory Visit: Payer: Self-pay

## 2016-12-22 DIAGNOSIS — I10 Essential (primary) hypertension: Secondary | ICD-10-CM | POA: Diagnosis not present

## 2016-12-22 DIAGNOSIS — I5032 Chronic diastolic (congestive) heart failure: Secondary | ICD-10-CM | POA: Diagnosis not present

## 2016-12-22 DIAGNOSIS — D649 Anemia, unspecified: Secondary | ICD-10-CM | POA: Diagnosis not present

## 2016-12-22 DIAGNOSIS — R195 Other fecal abnormalities: Secondary | ICD-10-CM | POA: Diagnosis not present

## 2016-12-22 DIAGNOSIS — D509 Iron deficiency anemia, unspecified: Secondary | ICD-10-CM | POA: Diagnosis not present

## 2016-12-22 DIAGNOSIS — Z886 Allergy status to analgesic agent status: Secondary | ICD-10-CM | POA: Diagnosis not present

## 2016-12-22 DIAGNOSIS — J449 Chronic obstructive pulmonary disease, unspecified: Secondary | ICD-10-CM | POA: Diagnosis present

## 2016-12-22 DIAGNOSIS — Z90710 Acquired absence of both cervix and uterus: Secondary | ICD-10-CM | POA: Diagnosis not present

## 2016-12-22 DIAGNOSIS — I2510 Atherosclerotic heart disease of native coronary artery without angina pectoris: Secondary | ICD-10-CM | POA: Diagnosis not present

## 2016-12-22 DIAGNOSIS — E785 Hyperlipidemia, unspecified: Secondary | ICD-10-CM | POA: Diagnosis not present

## 2016-12-22 DIAGNOSIS — D682 Hereditary deficiency of other clotting factors: Secondary | ICD-10-CM | POA: Diagnosis not present

## 2016-12-22 DIAGNOSIS — Z950 Presence of cardiac pacemaker: Secondary | ICD-10-CM | POA: Diagnosis not present

## 2016-12-22 DIAGNOSIS — Z91011 Allergy to milk products: Secondary | ICD-10-CM | POA: Diagnosis not present

## 2016-12-22 DIAGNOSIS — Z87891 Personal history of nicotine dependence: Secondary | ICD-10-CM | POA: Diagnosis not present

## 2016-12-22 DIAGNOSIS — Z800 Family history of malignant neoplasm of digestive organs: Secondary | ICD-10-CM | POA: Diagnosis not present

## 2016-12-22 DIAGNOSIS — E119 Type 2 diabetes mellitus without complications: Secondary | ICD-10-CM | POA: Diagnosis not present

## 2016-12-22 DIAGNOSIS — K648 Other hemorrhoids: Secondary | ICD-10-CM | POA: Diagnosis present

## 2016-12-22 DIAGNOSIS — K922 Gastrointestinal hemorrhage, unspecified: Secondary | ICD-10-CM | POA: Diagnosis present

## 2016-12-22 DIAGNOSIS — K625 Hemorrhage of anus and rectum: Secondary | ICD-10-CM | POA: Diagnosis not present

## 2016-12-22 DIAGNOSIS — I447 Left bundle-branch block, unspecified: Secondary | ICD-10-CM | POA: Diagnosis not present

## 2016-12-22 DIAGNOSIS — F329 Major depressive disorder, single episode, unspecified: Secondary | ICD-10-CM | POA: Diagnosis present

## 2016-12-22 DIAGNOSIS — D62 Acute posthemorrhagic anemia: Secondary | ICD-10-CM | POA: Diagnosis not present

## 2016-12-22 DIAGNOSIS — Z9102 Food additives allergy status: Secondary | ICD-10-CM | POA: Diagnosis not present

## 2016-12-22 DIAGNOSIS — I722 Aneurysm of renal artery: Secondary | ICD-10-CM | POA: Diagnosis not present

## 2016-12-22 DIAGNOSIS — K5731 Diverticulosis of large intestine without perforation or abscess with bleeding: Secondary | ICD-10-CM | POA: Diagnosis not present

## 2016-12-22 DIAGNOSIS — D571 Sickle-cell disease without crisis: Secondary | ICD-10-CM | POA: Diagnosis not present

## 2016-12-22 DIAGNOSIS — I110 Hypertensive heart disease with heart failure: Secondary | ICD-10-CM | POA: Diagnosis not present

## 2016-12-22 DIAGNOSIS — R1032 Left lower quadrant pain: Secondary | ICD-10-CM | POA: Diagnosis not present

## 2016-12-22 DIAGNOSIS — Z885 Allergy status to narcotic agent status: Secondary | ICD-10-CM | POA: Diagnosis not present

## 2016-12-22 DIAGNOSIS — Z91018 Allergy to other foods: Secondary | ICD-10-CM | POA: Diagnosis not present

## 2016-12-22 LAB — CBC
HCT: 37.8 % (ref 36.0–46.0)
HCT: 36.5 % (ref 36.0–46.0)
HCT: 36.1 % (ref 36.0–46.0)
HCT: 36 % (ref 36.0–46.0)
Hemoglobin: 12.4 g/dL (ref 12.0–15.0)
Hemoglobin: 11.8 g/dL — ABNORMAL LOW (ref 12.0–15.0)
Hemoglobin: 11.5 g/dL — ABNORMAL LOW (ref 12.0–15.0)
Hemoglobin: 11.7 g/dL — ABNORMAL LOW (ref 12.0–15.0)
MCH: 25.4 pg — ABNORMAL LOW (ref 26.0–34.0)
MCH: 25.2 pg — ABNORMAL LOW (ref 26.0–34.0)
MCH: 24.8 pg — ABNORMAL LOW (ref 26.0–34.0)
MCH: 25.2 pg — ABNORMAL LOW (ref 26.0–34.0)
MCHC: 32.8 g/dL (ref 30.0–36.0)
MCHC: 32.3 g/dL (ref 30.0–36.0)
MCHC: 31.9 g/dL (ref 30.0–36.0)
MCHC: 32.5 g/dL (ref 30.0–36.0)
MCV: 77.5 fL — ABNORMAL LOW (ref 78.0–100.0)
MCV: 77.8 fL — ABNORMAL LOW (ref 78.0–100.0)
MCV: 77.8 fL — ABNORMAL LOW (ref 78.0–100.0)
MCV: 77.4 fL — ABNORMAL LOW (ref 78.0–100.0)
Platelets: 199 10*3/uL (ref 150–400)
Platelets: 199 10*3/uL (ref 150–400)
Platelets: 195 10*3/uL (ref 150–400)
Platelets: 214 10*3/uL (ref 150–400)
RBC: 4.88 MIL/uL (ref 3.87–5.11)
RBC: 4.69 MIL/uL (ref 3.87–5.11)
RBC: 4.64 MIL/uL (ref 3.87–5.11)
RBC: 4.65 MIL/uL (ref 3.87–5.11)
RDW: 14.9 % (ref 11.5–15.5)
RDW: 14.9 % (ref 11.5–15.5)
RDW: 14.7 % (ref 11.5–15.5)
RDW: 14.6 % (ref 11.5–15.5)
WBC: 7 10*3/uL (ref 4.0–10.5)
WBC: 6.5 10*3/uL (ref 4.0–10.5)
WBC: 6.3 10*3/uL (ref 4.0–10.5)
WBC: 6.8 10*3/uL (ref 4.0–10.5)

## 2016-12-22 LAB — GLUCOSE, CAPILLARY
Glucose-Capillary: 155 mg/dL — ABNORMAL HIGH (ref 65–99)
Glucose-Capillary: 108 mg/dL — ABNORMAL HIGH (ref 65–99)
Glucose-Capillary: 137 mg/dL — ABNORMAL HIGH (ref 65–99)

## 2016-12-22 LAB — HIV ANTIBODY (ROUTINE TESTING W REFLEX): HIV Screen 4th Generation wRfx: NONREACTIVE

## 2016-12-22 LAB — HEMOGLOBIN A1C
Hgb A1c MFr Bld: 8.9 % — ABNORMAL HIGH (ref 4.8–5.6)
Mean Plasma Glucose: 208.73 mg/dL

## 2016-12-22 LAB — BASIC METABOLIC PANEL
Anion gap: 7 (ref 5–15)
BUN: 13 mg/dL (ref 6–20)
CO2: 24 mmol/L (ref 22–32)
Calcium: 9 mg/dL (ref 8.9–10.3)
Chloride: 112 mmol/L — ABNORMAL HIGH (ref 101–111)
Creatinine, Ser: 0.63 mg/dL (ref 0.44–1.00)
GFR calc Af Amer: >60 mL/min (ref >60)
GFR calc non Af Amer: >60 mL/min (ref >60)
Glucose, Bld: 165 mg/dL — ABNORMAL HIGH (ref 65–99)
Potassium: 3.6 mmol/L (ref 3.5–5.1)
Sodium: 143 mmol/L (ref 135–145)

## 2016-12-22 MED ORDER — LORATADINE 10 MG PO TABS
10.0000 mg | ORAL_TABLET | Freq: Every day | ORAL | Status: DC
  Administered 2016-12-22 – 2016-12-27 (×6): 10 mg via ORAL
  Filled 2016-12-22 (×6): qty 1

## 2016-12-22 MED ORDER — METOPROLOL SUCCINATE ER 50 MG PO TB24
100.0000 mg | ORAL_TABLET | Freq: Every day | ORAL | Status: DC
  Administered 2016-12-22 – 2016-12-27 (×6): 100 mg via ORAL
  Filled 2016-12-22 (×6): qty 2

## 2016-12-22 MED ORDER — ONDANSETRON HCL 4 MG PO TABS: 4.0000 mg | ORAL_TABLET | Freq: Four times a day (QID) | ORAL | Status: DC | PRN

## 2016-12-22 MED ORDER — ACETAMINOPHEN 650 MG RE SUPP: 650.0000 mg | Freq: Four times a day (QID) | RECTAL | Status: DC | PRN

## 2016-12-22 MED ORDER — ATORVASTATIN CALCIUM 40 MG PO TABS
40.0000 mg | ORAL_TABLET | Freq: Every day | ORAL | Status: DC
  Administered 2016-12-23 – 2016-12-26 (×4): 40 mg via ORAL
  Filled 2016-12-22 (×5): qty 1

## 2016-12-22 MED ORDER — INSULIN ASPART 100 UNIT/ML ~~LOC~~ SOLN
0.0000 [IU] | Freq: Three times a day (TID) | SUBCUTANEOUS | Status: DC
  Administered 2016-12-22: 3 [IU] via SUBCUTANEOUS
  Administered 2016-12-23: 5 [IU] via SUBCUTANEOUS
  Administered 2016-12-23 – 2016-12-24 (×5): 3 [IU] via SUBCUTANEOUS
  Administered 2016-12-25: 5 [IU] via SUBCUTANEOUS
  Administered 2016-12-25 (×2): 3 [IU] via SUBCUTANEOUS
  Administered 2016-12-26: 5 [IU] via SUBCUTANEOUS
  Administered 2016-12-26 – 2016-12-27 (×2): 3 [IU] via SUBCUTANEOUS

## 2016-12-22 MED ORDER — ONDANSETRON HCL 4 MG/2ML IJ SOLN: 4.0000 mg | Freq: Four times a day (QID) | INTRAMUSCULAR | Status: DC | PRN

## 2016-12-22 MED ORDER — LACTATED RINGERS IV SOLN
INTRAVENOUS | Status: DC
  Administered 2016-12-22 – 2016-12-26 (×3): via INTRAVENOUS

## 2016-12-22 MED ORDER — IRBESARTAN 300 MG PO TABS
300.0000 mg | ORAL_TABLET | Freq: Every day | ORAL | Status: DC
  Administered 2016-12-22 – 2016-12-26 (×5): 300 mg via ORAL
  Filled 2016-12-22 (×5): qty 1

## 2016-12-22 MED ORDER — TOPIRAMATE 100 MG PO TABS
100.0000 mg | ORAL_TABLET | Freq: Every day | ORAL | Status: DC
  Administered 2016-12-22 – 2016-12-27 (×6): 100 mg via ORAL
  Filled 2016-12-22 (×6): qty 1

## 2016-12-22 MED ORDER — INSULIN ASPART 100 UNIT/ML ~~LOC~~ SOLN
0.0000 [IU] | Freq: Every day | SUBCUTANEOUS | Status: DC
  Administered 2016-12-24 – 2016-12-26 (×2): 2 [IU] via SUBCUTANEOUS

## 2016-12-22 MED ORDER — ACETAMINOPHEN 325 MG PO TABS: 650.0000 mg | ORAL_TABLET | Freq: Four times a day (QID) | ORAL | Status: DC | PRN

## 2016-12-22 MED ORDER — HYDROCORTISONE 2.5 % RE CREA
TOPICAL_CREAM | Freq: Two times a day (BID) | RECTAL | Status: DC
  Administered 2016-12-22: 22:00:00 via RECTAL
  Administered 2016-12-23: 1 via RECTAL
  Administered 2016-12-23 – 2016-12-25 (×5): via RECTAL
  Filled 2016-12-22 (×3): qty 28.35

## 2016-12-22 MED ORDER — FENOFIBRATE 160 MG PO TABS
160.0000 mg | ORAL_TABLET | Freq: Every day | ORAL | Status: DC
  Administered 2016-12-22 – 2016-12-27 (×6): 160 mg via ORAL
  Filled 2016-12-22 (×6): qty 1

## 2016-12-22 MED ORDER — OXCARBAZEPINE 300 MG PO TABS
600.0000 mg | ORAL_TABLET | Freq: Two times a day (BID) | ORAL | Status: DC
Start: 2016-12-22 — End: 2016-12-27
  Administered 2016-12-22 – 2016-12-27 (×11): 600 mg via ORAL
  Filled 2016-12-22 (×12): qty 2

## 2016-12-22 MED ORDER — ALBUTEROL SULFATE (2.5 MG/3ML) 0.083% IN NEBU: 3.0000 mL | INHALATION_SOLUTION | Freq: Four times a day (QID) | RESPIRATORY_TRACT | Status: DC | PRN

## 2016-12-22 MED ORDER — AMLODIPINE BESYLATE 10 MG PO TABS
10.0000 mg | ORAL_TABLET | Freq: Every day | ORAL | Status: DC
Start: 2016-12-22 — End: 2016-12-27
  Administered 2016-12-22 – 2016-12-27 (×6): 10 mg via ORAL
  Filled 2016-12-22 (×6): qty 1

## 2016-12-22 NOTE — Progress Notes (Signed)
PROGRESS NOTE  Stacey [name] [id number] DOB: [birth year] DOA: 12/21/2016 PCP: Biagio Borg, MD   LOS: 0 days   Brief Narrative / Interim history: Stacey [name] is a 66 y.o. female with medical history significant of sickle cell; renal artery aneurysm; HTN; HLD; diverticulosis; depression; COPD; pacemaker; COPD; chronic diastolic CHF; and CAD presenting with BRBPR.   This is been going on for about 4 days.  GI was consulted.  Assessment & Plan: Principal Problem:   GI bleeding Active Problems:   Essential hypertension   Chronic diastolic heart failure (HCC)   Diabetes (HCC)   Pacemaker-Medtronic  MRI compatible   Lower GI bleed -Diverticular versus hemorrhoidal bleed -GI consulted, appreciate input, will monitor in the hospital for another 24 hours.  Hemoglobin currently stable, she does not need transfusions. -Continue to monitor for recurrent bleeding -Started on steroid cream for presumed hemorrhoidal bleeding by GI  Hypertension -Continue home medications  Type 2 diabetes mellitus -Sliding scale  Chronic diastolic CHF -Appears compensated  Pacemaker -Placed for complete heart block, stable   DVT prophylaxis: SCDs Code Status: Full code Family Communication: No family at bedside Disposition Plan: Home when ready  Consultants:   Gastroenterology  Procedures:   None   Antimicrobials:  None    Subjective: - no chest pain, shortness of breath, no abdominal pain, nausea or vomiting.   Objective: Vitals:   12/21/16 2101 12/21/16 2329 12/22/16 0201 12/22/16 0540  BP: (!) 147/82 140/66 136/79 127/82  Pulse: 61 61 63 72  Resp: '16 16 17 16  '$ Temp: 97.8 F (36.6 C) 98.5 F (36.9 C) 98.2 F (36.8 C) 98.6 F (37 C)  TempSrc: Oral Oral Oral Oral  SpO2: 99% 97% 99% 99%  Weight:      Height:        Intake/Output Summary (Last 24 hours) at 12/22/2016 1315 Last data filed at 12/22/2016 0541 Gross per 24 hour  Intake  1000 ml  Output 2 ml  Net 998 ml   Filed Weights   12/21/16 1549  Weight: 77.6 kg (171 lb)    Examination:  Constitutional: NAD Eyes: lids and conjunctivae normal ENMT: Mucous membranes are moist. No oropharyngeal exudates Neck: normal, supple Respiratory: clear to auscultation bilaterally, no wheezing, no crackles. Cardiovascular: Regular rate and rhythm, no murmurs / rubs / gallops.  Abdomen: no tenderness. Bowel sounds positive.  Skin: no rashes Neurologic: non focal   Data Reviewed: I have independently reviewed following labs and imaging studies   CBC: Recent Labs  Lab 12/21/16 1551 12/21/16 1825 12/22/16 0137 12/22/16 0735  WBC 7.5 7.3 7.0 6.5  NEUTROABS  --  4.3  --   --   HGB 12.5 11.9* 12.4 11.8*  HCT 38.4 36.7 37.8 36.5  MCV 77.7* 77.4* 77.5* 77.8*  PLT 205 199 199 [id number]   Basic Metabolic Panel: Recent Labs  Lab 12/21/16 1551 12/22/16 0140  NA 139 143  K 4.2 3.6  CL 108 112*  CO2 24 24  GLUCOSE 169* 165*  BUN 14 13  CREATININE 0.78 0.63  CALCIUM 9.0 9.0   GFR: Estimated Creatinine Clearance: 73.3 mL/min (by C-G formula based on SCr of 0.63 mg/dL). Liver Function Tests: Recent Labs  Lab 12/21/16 1551  AST 27  ALT 29  ALKPHOS 161*  BILITOT 0.5  PROT 7.2  ALBUMIN 4.0   No results for input(s): LIPASE, AMYLASE in the last 168 hours. No results for input(s): AMMONIA in the last 168 hours. Coagulation Profile: No  results for input(s): INR, PROTIME in the last 168 hours. Cardiac Enzymes: No results for input(s): CKTOTAL, CKMB, CKMBINDEX, TROPONINI in the last 168 hours. BNP (last 3 results) No results for input(s): PROBNP in the last 8760 hours. HbA1C: Recent Labs    12/22/16 0137  HGBA1C 8.9*   CBG: Recent Labs  Lab 12/22/16 0752 12/22/16 1228  GLUCAP 155* 108*   Lipid Profile: No results for input(s): CHOL, HDL, LDLCALC, TRIG, CHOLHDL, LDLDIRECT in the last 72 hours. Thyroid Function Tests: No results for input(s): TSH,  T4TOTAL, FREET4, T3FREE, THYROIDAB in the last 72 hours. Anemia Panel: No results for input(s): VITAMINB12, FOLATE, FERRITIN, TIBC, IRON, RETICCTPCT in the last 72 hours. Urine analysis:    Component Value Date/Time   COLORURINE YELLOW 09/04/2015 1500   APPEARANCEUR CLEAR 09/04/2015 1500   LABSPEC 1.025 09/04/2015 1500   PHURINE 5.5 09/04/2015 1500   GLUCOSEU 100 (A) 09/04/2015 1500   HGBUR NEGATIVE 09/04/2015 1500   BILIRUBINUR NEGATIVE 09/04/2015 1500   KETONESUR TRACE (A) 09/04/2015 1500   PROTEINUR NEGATIVE 01/26/2010 0526   UROBILINOGEN 0.2 09/04/2015 1500   NITRITE NEGATIVE 09/04/2015 1500   LEUKOCYTESUR NEGATIVE 09/04/2015 1500   Sepsis Labs: Invalid input(s): PROCALCITONIN, LACTICIDVEN  No results found for this or any previous visit (from the past 240 hour(s)).   Radiology Studies: Dg Abd Acute W/chest  Result Date: 12/21/2016 CLINICAL DATA:  Pt states she is having a diverticulitis flare up. Pt c/o of nausea, abdominal distension, and abdominal pain. No hx of surgeries. Denies constipation and diarrhea. EXAM: DG ABDOMEN ACUTE W/ 1V CHEST COMPARISON:  02/27/2014 FINDINGS: Patient's left-sided transvenous pacer with leads to the right atrium and right ventricle. Status post median sternotomy. The heart is normal in size. There are no focal consolidations or pleural effusions. There is stable focal pleural thickening at the left lung apex since 2014, consistent with benign cause. Bowel gas pattern is nonobstructive. There is a large amount of stool throughout the colon. No abnormal calcifications or evidence for organomegaly. IMPRESSION: 1.  No evidence for acute cardiopulmonary abnormality. 2. Large stool burden. Electronically Signed   By: Nolon Nations M.D.   On: 12/21/2016 19:22   Scheduled Meds: . amLODipine  10 mg Oral Daily  . atorvastatin  40 mg Oral q1800  . fenofibrate  160 mg Oral Daily  . insulin aspart  0-15 Units Subcutaneous TID WC  . insulin aspart  0-5  Units Subcutaneous QHS  . irbesartan  300 mg Oral QHS  . loratadine  10 mg Oral Daily  . metoprolol succinate  100 mg Oral Daily  . oxcarbazepine  600 mg Oral BID  . topiramate  100 mg Oral Daily   Continuous Infusions: . lactated ringers 75 mL/hr at 12/22/16 Angelica, MD, PhD [facility] Pager [phone]  If 7PM-7AM, please contact night-coverage [email or web address] Password [email or web address] 12/22/2016, 1:15 PM

## 2016-12-22 NOTE — Care Management Obs Status (Signed)
MEDICARE OBSERVATION STATUS NOTIFICATION   Patient Details  Name: Stacey MorinChristine O Crawford-Fewell MRN: [id number] Date of Birth: 06/09/1950   Medicare Observation Status Notification Given:  Yes    Bartholome BillCLEMENTS, Chardonnay Holzmann H, RN 12/22/2016, 12:29 PM

## 2016-12-22 NOTE — Consult Note (Signed)
Referring Provider: [facility]   Primary Care Physician:  Biagio Borg, MD Primary Gastroenterologist:   Silvano Rusk, MD  Reason for Consultation:  Rectal bleeding    Attending physician's note   I have taken a history, examined the patient and reviewed the chart. I agree with the Advanced Practitioner's note, impression and recommendations. Low volume painless hematochezia. Could be a low volume diverticular bleed however more likely hemorrhoidal. Continue to observe for bleeding and trend CBC. Given colonoscopy performed 3 year ago showing diverticulosis and internal hemorrhoids we do not plan to repeat her colonoscopy at this time.   Lucio Edward, MD Marval Regal [phone] Mon-Fri 8a-5p [phone] after 5p, weekends, holidays   ASSESSMENT AND PLAN:    12. 66 year old female admitted with painless rectal bleeding.  She does have pandiverticulosis and this could be a low-volume diverticular hemorrhage but I suspect it is probably hemorrhoidal in nature.  I did not appreciate a fissure nor was she tender on DRE.  -Recommend patient be kept overnight for monitoring of blood counts and bleeding.   -We will start steroid cream for presumed hemorrhoidal bleeding. -Keep on clear liquids today.  Further recommendations depending on clinical course  2.  Family medical history colon cancer.  Patient had no cancers or polyps on colonoscopy October 2015.  Recall colonoscopy due October year 2020  3.  Mild microcytic anemia. Baseline hemoglobin 12-13. MCV in 70's. Presenting hemoglobin yesterday 12.5, down to 11.8 today (with IV fluids). Hx of sickle cell anemia.    HPI: Stacey [name] is a 66 y.o. female with history of PMH of DM 2 , COPD, chronic diastolic heart failure, pacemaker, CAD,  and sickle cell. Her GI hx is pertinent for internal hemorrhoids, pan-diverticulosis / diverticulitis, and [place] of colon cancer.  Patient was admitted through the emergency department yesterday for  evaluation of bloody stools.  Over the last 4 days patient has been passing a small amount of blood with each BM. She has no anorectal pain. She is having some intestinal gurgling.  She has intermittent left lower quadrant pain but upon further questioning clarifies that this pain is chronic and is very much diet dependent.  Her typical bowel pattern consist of 3-4 bowel movements a day.  Stool consistency generally ranges from formed to unformed but no diarrhea nor constipation on a regular basis. Baseline hemoglobin 12-13, yesterday in the ED it was 12.5 and currently 11.MCV is low at 77.  She does not take blood thinners nor NSAIDs.  Colonoscopy in 2015 remarkable for severe diverticulosis of the right and left colon. Past Medical History:  Diagnosis Date  . ANGIOEDEMA 03/07/2008   a. with ACE-I  . ANXIETY 09/16/2006  . ASTHMA 08/01/2006  . CAD (coronary artery disease)    a. 01/2010 : Minimal plaque at cardiac catheterization - CFX 20%, EF 70%;  b. 08/2012 Cath: Nl Cors, EF 65%.  . Chronic diastolic CHF (congestive heart failure) ([place]) 04/06/2010   a. In setting of HOCM.  Marland Kitchen Complete heart block ([place]) 02/26/2014   a. s/p MDT dual chamber pacemaker 02/2014  . COPD (chronic obstructive pulmonary disease) ([place])   . DEPRESSION 09/16/2006  . DIVERTICULOSIS, COLON, WITH HEMORRHAGE 04/06/2010  . DM (diabetes mellitus) in pregnancy, delivered w/postpartum condition 08/30/2010  . HYPERLIPIDEMIA 08/01/2006  . HYPERTENSION 08/01/2006  . 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 s/p septal myomectomy 4/12 at [facility] with Dr. Evelina Dun;  b. echo 5/12: EF 60-65%, LVOT peak 18 mmHg; grade 1 diast  dysfxn, mild SAM (improved since myomectomy;  c. 03/2012 Echo: EF 60-65%, mod-sev basal septal asymm hypertrophy, basal septal HK, LVOT grad 61mHg, Gr 1 DD, , SAM, Mild MR, nl RV, PASP 319mg; 08/2012 Echo: technically difficult, doubt LVOT obstruction, EF 60%, Gr 1 DD, mod-sev dil LA.  . Impaired glucose tolerance 06/25/2010  . LBBB (left  bundle branch block) 06/17/2010  . OBESITY 09/16/2006  . 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 SICKLE CELL ANEMIA 08/01/2006  . Type II or unspecified type diabetes mellitus without mention of complication, uncontrolled 08/30/2010  . VENTRAL HERNIA 12/07/2006    Past Surgical History:  Procedure Laterality Date  . ABDOMINAL HYSTERECTOMY  1987  . CARDIAC SURGERY    . ELECTROPHYSIOLOGY STUDY N/A 09/12/2012   Procedure: ELECTROPHYSIOLOGY STUDY;  Surgeon: StDeboraha SprangMD;  [facility];  Service: Cardiovascular;  Laterality: N/A;  . LEFT HEART CATH  Aug. 18, 2014   Medtronic heart device  . LEFT HEART CATHETERIZATION WITH CORONARY ANGIOGRAM N/A 09/10/2012   Procedure: LEFT HEART CATHETERIZATION WITH CORONARY ANGIOGRAM;  Surgeon: ChBurnell BlanksMD;  [facility];  Service: Cardiovascular;  Laterality: N/A;  . MYOMECTOMY     Septal  . PERMANENT PACEMAKER INSERTION N/A 02/26/2014   MDT Advisa dual chamber pacemaker implanted by Dr KlCaryl Comesor heart block  . VENTRAL HERNIA REPAIR  06/2006    Prior to Admission medications   Medication Sig Start Date End Date Taking? Authorizing Provider  acetaminophen (TYLENOL) 500 MG tablet Take 500 mg by mouth daily as needed.   Yes [provider]  albuterol (PROVENTIL HFA;VENTOLIN HFA) 108 (90 BASE) MCG/ACT inhaler Inhale 2 puffs into the lungs every 6 (six) hours as needed for wheezing or shortness of breath. 11/14/14  Yes JoBiagio BorgMD  amLODipine (NORVASC) 10 MG tablet TAKE 1 TABLET BY MOUTH EVERY DAY 04/18/16  Yes JoBiagio BorgMD  atorvastatin (LIPITOR) 40 MG tablet TAKE 1 TABLET(40 MG) BY MOUTH DAILY 10/17/16  Yes JoBiagio BorgMD  cetirizine (ZYRTEC) 10 MG tablet Take 10 mg by mouth daily.     Yes [provider]  fenofibrate (TRICOR) 145 MG tablet TAKE 1 TABLET BY MOUTH DAILY 05/25/15  Yes JoBiagio BorgMD  glipiZIDE (GLUCOTROL XL) 10 MG 24 hr tablet Take 1 tablet (10 mg total) by mouth daily with breakfast. 02/26/16   Yes JoBiagio BorgMD  irbesartan (AVAPRO) 300 MG tablet TAKE 1 TABLET(300 MG) BY MOUTH AT BEDTIME 03/01/16  Yes JoBiagio BorgMD  meclizine (ANTIVERT) 12.5 MG tablet Take 1 tablet (12.5 mg total) by mouth 3 (three) times daily as needed for dizziness. 08/04/16  Yes JoBiagio BorgMD  metoprolol succinate (TOPROL-XL) 100 MG 24 hr tablet TAKE 1 TABLET BY MOUTH EVERY DAY 04/13/15  Yes JoBiagio BorgMD  ondansetron (ZOFRAN) 4 MG tablet Take 1 tablet (4 mg total) by mouth every 8 (eight) hours as needed for nausea or vomiting. As needed for nausea or vomiting. 12/31/15  Yes JoBiagio BorgMD  oxcarbazepine (TRILEPTAL) 600 MG tablet Take 600 mg by mouth 2 (two) times daily.   Yes [provider]  potassium chloride (K-DUR) 10 MEQ tablet Take 1 tablet (10 mEq total) by mouth 2 (two) times daily. 02/27/14  Yes Barrett, RhEvelene CroonPA-C  topiramate (TOPAMAX) 100 MG tablet Take 100 mg by mouth daily.  03/17/14  Yes [provider]  Blood Glucose Monitoring Suppl (ONE TOUCH  ULTRA 2) W/DEVICE KIT Use to check blood sugars daily Dx E11.9 05/16/14   Biagio Borg, MD  glucose blood test strip 1 each by Other route daily. Use to check blood sugar daily Dx e11.9 05/16/14   Biagio Borg, MD  [facility] DELICA LANCETS 16S MISC USE TO HELP CHECK BLOOD SUGAR DAILY 07/15/14   Biagio Borg, MD    Current Facility-Administered Medications  Medication Dose Route Frequency Provider Last Rate Last Dose  . acetaminophen (TYLENOL) tablet 650 mg  650 mg Oral Q6H PRN Karmen Bongo, MD       Or  . acetaminophen (TYLENOL) suppository 650 mg  650 mg Rectal Q6H PRN Karmen Bongo, MD      . albuterol (PROVENTIL) (2.5 MG/3ML) 0.083% nebulizer solution 3 mL  3 mL Inhalation Q6H PRN Karmen Bongo, MD      . amLODipine (NORVASC) tablet 10 mg  10 mg Oral Daily Karmen Bongo, MD      . atorvastatin (LIPITOR) tablet 40 mg  40 mg Oral q1800 Karmen Bongo, MD      . fenofibrate tablet 160 mg  160 mg Oral Daily Karmen Bongo, MD      . insulin aspart (novoLOG) injection 0-15 Units  0-15 Units Subcutaneous TID WC Karmen Bongo, MD   3 Units at 12/22/16 [phone]  . insulin aspart (novoLOG) injection 0-5 Units  0-5 Units Subcutaneous QHS Karmen Bongo, MD      . irbesartan Levy Sjogren) tablet 300 mg  300 mg Oral QHS Karmen Bongo, MD      . lactated ringers infusion   Intravenous Continuous Karmen Bongo, MD      . loratadine (CLARITIN) tablet 10 mg  10 mg Oral Daily Karmen Bongo, MD      . metoprolol succinate (TOPROL-XL) 24 hr tablet 100 mg  100 mg Oral Daily Karmen Bongo, MD      . ondansetron [facility]) tablet 4 mg  4 mg Oral Q6H PRN Karmen Bongo, MD       Or  . ondansetron [facility]) injection 4 mg  4 mg Intravenous Q6H PRN Karmen Bongo, MD      . Oxcarbazepine (TRILEPTAL) tablet 600 mg  600 mg Oral BID Karmen Bongo, MD      . topiramate (TOPAMAX) tablet 100 mg  100 mg Oral Daily Karmen Bongo, MD        Allergies as of 12/21/2016 - Review Complete 12/21/2016  Allergen Reaction Noted  . Dairy aid [lactase] Diarrhea and Other (See Comments) 10/21/2013  . Metformin and related Other (See Comments) 02/26/2016  . Ace inhibitors Other (See Comments) 01/14/2010  . Aspirin Other (See Comments)   . Codeine Other (See Comments) 09/16/2006  . Soy allergy Other (See Comments) 06/17/2010    Family History  Problem Relation Age of Onset  . Heart disease Father   . Heart attack Father   . Hypertension Father        before age 37  . Colon cancer Daughter        and bleeding disorder  . Hyperlipidemia Daughter   . Heart disease Daughter   . Cancer Daughter   . Heart attack Sister   . Hyperlipidemia Sister   . Stroke Sister   . Hypertension Sister   . Hyperlipidemia Brother   . Hypertension Brother   . Diabetes Paternal Grandmother   . Clotting disorder Daughter   . Clotting disorder Other        granddaughter  . Hypertension Son  Social History   Socioeconomic History  .  Marital status: Married    Spouse name: Not on file  . Number of children: 3  . Years of education: Not on file  . Highest education level: Not on file  Social Needs  . Financial resource strain: Not on file  . Food insecurity - worry: Not on file  . Food insecurity - inability: Not on file  . Transportation needs - medical: Not on file  . Transportation needs - non-medical: Not on file  Occupational History  . Occupation: retired Product manager: Psychologist, sport and exercise [employer]  Tobacco Use  . Smoking status: Former Smoker    Packs/day: 0.25    Years: 20.00    Pack years: 5.00    Types: Cigarettes    Last attempt to quit: 01/24/1998    Years since quitting: 18.9  . Smokeless tobacco: Never Used  . Tobacco comment: Quit smoking 2001. Smoked on and off for 20 years. Smoked 2-3 cigars daily  Substance and Sexual Activity  . Alcohol use: No    Alcohol/week: 0.0 oz  . Drug use: No  . Sexual activity: Not on file  Other Topics Concern  . Not on file  Social History Narrative   Lives in [place] with husband.  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    Review of Systems: All systems reviewed and negative except where noted in HPI.  Physical Exam: Vital signs in last 24 hours: Temp:  [97.8 F (36.6 C)-98.6 F (37 C)] 98.6 F (37 C) (11/29 0540) Pulse Rate:  [60-74] 72 (11/29 0540) Resp:  [16-20] 16 (11/29 0540) BP: (122-147)/(65-82) 127/82 (11/29 0540) SpO2:  [95 %-99 %] 99 % (11/29 0540) Weight:  [171 lb (77.6 kg)] 171 lb (77.6 kg) (11/28 1549)   General:   Alert, well-developed, white female in NAD Psych:  Pleasant, cooperative. Normal mood and affect. Eyes:  Pupils equal, sclera clear, no icterus.   Conjunctiva pink. Ears:  Normal auditory acuity. Nose:  No deformity, discharge,  or lesions. Neck:  Supple; no masses Lungs:  Clear throughout to auscultation.   No wheezes, crackles, or rhonchi.  Heart:  Regular rate and rhythm; no murmurs, no  edema Abdomen:  Soft, non-distended, nontender, BS active, no palp mass    Rectal: Mildly swollen external hemorrhoids.  No fissures seen.  DRE was not painful for her.  Soft stool mixed with blood on gloved finger Msk:  Symmetrical without gross deformities. . Pulses:  Normal pulses noted. Neurologic:  Alert and  oriented x4;  grossly normal neurologically. Skin:  Intact without significant lesions or rashes..   Intake/Output from previous day: 11/28 0701 - 11/29 0700 In: 1000 [IV Piggyback:1000] Out: 2 [Urine:2] Intake/Output this shift: No intake/output data recorded.  Lab Results: Recent Labs    12/21/16 1825 12/22/16 0137 12/22/16 0735  WBC 7.3 7.0 6.5  HGB 11.9* 12.4 11.8*  HCT 36.7 37.8 36.5  PLT 199 199 199   BMET Recent Labs    12/21/16 1551 12/22/16 0140  NA 139 143  K 4.2 3.6  CL 108 112*  CO2 24 24  GLUCOSE 169* 165*  BUN 14 13  CREATININE 0.78 0.63  CALCIUM 9.0 9.0   LFT Recent Labs    12/21/16 1551  PROT 7.2  ALBUMIN 4.0  AST 27  ALT 29  ALKPHOS 161*  BILITOT 0.5   PT/INR No results for input(s): LABPROT, INR in the last 72 hours.  Hepatitis Panel No results for input(s): HEPBSAG, HCVAB, HEPAIGM, HEPBIGM in the last 72 hours.    Studies/Results: Dg Abd Acute W/chest  Result Date: 12/21/2016 CLINICAL DATA:  Pt states she is having a diverticulitis flare up. Pt c/o of nausea, abdominal distension, and abdominal pain. No hx of surgeries. Denies constipation and diarrhea. EXAM: DG ABDOMEN ACUTE W/ 1V CHEST COMPARISON:  02/27/2014 FINDINGS: Patient's left-sided transvenous pacer with leads to the right atrium and right ventricle. Status post median sternotomy. The heart is normal in size. There are no focal consolidations or pleural effusions. There is stable focal pleural thickening at the left lung apex since 2014, consistent with benign cause. Bowel gas pattern is nonobstructive. There is a large amount of stool throughout the colon. No  abnormal calcifications or evidence for organomegaly. IMPRESSION: 1.  No evidence for acute cardiopulmonary abnormality. 2. Large stool burden. Electronically Signed   By: Nolon Nations M.D.   On: 12/21/2016 19:22     Tye Savoy, NP-C @  12/22/2016, 9:39 AM  Pager number [phone]

## 2016-12-23 DIAGNOSIS — D682 Hereditary deficiency of other clotting factors: Secondary | ICD-10-CM

## 2016-12-23 DIAGNOSIS — K922 Gastrointestinal hemorrhage, unspecified: Secondary | ICD-10-CM

## 2016-12-23 DIAGNOSIS — R1032 Left lower quadrant pain: Secondary | ICD-10-CM

## 2016-12-23 DIAGNOSIS — I10 Essential (primary) hypertension: Secondary | ICD-10-CM

## 2016-12-23 DIAGNOSIS — D649 Anemia, unspecified: Secondary | ICD-10-CM

## 2016-12-23 DIAGNOSIS — R195 Other fecal abnormalities: Secondary | ICD-10-CM

## 2016-12-23 DIAGNOSIS — I5032 Chronic diastolic (congestive) heart failure: Secondary | ICD-10-CM

## 2016-12-23 LAB — CBC
HCT: 34.3 % — ABNORMAL LOW (ref 36.0–46.0)
Hemoglobin: 11.1 g/dL — ABNORMAL LOW (ref 12.0–15.0)
MCH: 25.1 pg — ABNORMAL LOW (ref 26.0–34.0)
MCHC: 32.4 g/dL (ref 30.0–36.0)
MCV: 77.4 fL — ABNORMAL LOW (ref 78.0–100.0)
Platelets: 195 10*3/uL (ref 150–400)
RBC: 4.43 MIL/uL (ref 3.87–5.11)
RDW: 14.7 % (ref 11.5–15.5)
WBC: 6.6 10*3/uL (ref 4.0–10.5)

## 2016-12-23 LAB — FIBRINOGEN: Fibrinogen: 90 mg/dL — CL (ref 210–475)

## 2016-12-23 LAB — RETICULOCYTES
RBC.: 4.59 MIL/uL (ref 3.87–5.11)
Retic Count, Absolute: 151.5 10*3/uL (ref 19.0–186.0)
Retic Ct Pct: 3.3 % — ABNORMAL HIGH (ref 0.4–3.1)

## 2016-12-23 LAB — GLUCOSE, CAPILLARY
Glucose-Capillary: 168 mg/dL — ABNORMAL HIGH (ref 65–99)
Glucose-Capillary: 225 mg/dL — ABNORMAL HIGH (ref 65–99)
Glucose-Capillary: 187 mg/dL — ABNORMAL HIGH (ref 65–99)
Glucose-Capillary: 192 mg/dL — ABNORMAL HIGH (ref 65–99)

## 2016-12-23 LAB — LACTATE DEHYDROGENASE: LDH: 162 U/L (ref 98–192)

## 2016-12-23 LAB — PROTIME-INR
INR: 1.35
Prothrombin Time: 16.6 seconds — ABNORMAL HIGH (ref 11.4–15.2)

## 2016-12-23 LAB — APTT: aPTT: 30 seconds (ref 24–36)

## 2016-12-23 MED ORDER — DIPHENHYDRAMINE HCL 25 MG PO CAPS
25.0000 mg | ORAL_CAPSULE | Freq: Once | ORAL | Status: AC
  Administered 2016-12-24: 25 mg via ORAL
  Filled 2016-12-23: qty 1

## 2016-12-23 MED ORDER — SODIUM CHLORIDE 0.9 % IV SOLN
Freq: Once | INTRAVENOUS | Status: AC
  Administered 2016-12-24: 01:00:00 via INTRAVENOUS

## 2016-12-23 NOTE — Progress Notes (Signed)
[facility] Progress Note  Chief Complaint:    rectal bleeding  Subjective: Feels okay. Still sees blood (now alternating between dark and brighter red) with Bms. Only had a couple of BMs yesterday because not eating.   Objective:  Vital signs in last 24 hours: Temp:  [98.4 F (36.9 C)-98.9 F (37.2 C)] 98.9 F (37.2 C) (11/30 0445) Pulse Rate:  [60-67] 67 (11/30 0445) Resp:  [16-18] 18 (11/30 0445) BP: (137-144)/(66-71) 137/71 (11/30 0445) SpO2:  [96 %-97 %] 97 % (11/30 0445) Last BM Date: 12/22/16 General:   Alert, well-developed, female in NAD EENT:  Normal hearing, non icteric sclera, conjunctive pink.  Heart:  Regular rate and rhythm;  no lower extremity edema Pulm: Normal respiratory effort Abdomen:  Soft, nondistended, nontender.  Normal bowel sounds, no masses felt. No hepatomegaly.    Neurologic:  Alert and  oriented x4;  grossly normal neurologically. Psych:  Pleasant, cooperative.  Normal mood and affect.   Intake/Output from previous day: 11/29 0701 - 11/30 0700 In: 327.5 [P.O.:120; I.V.:207.5] Out: 3 [Urine:2; Stool:1] Intake/Output this shift: Total I/O In: 360 [P.O.:360] Out: -   Lab Results: Recent Labs    12/22/16 1404 12/22/16 1934 12/23/16 0208  WBC 6.3 6.8 6.6  HGB 11.5* 11.7* 11.1*  HCT 36.1 36.0 34.3*  PLT 195 214 195   BMET Recent Labs    12/21/16 1551 12/22/16 0140  NA 139 143  K 4.2 3.6  CL 108 112*  CO2 24 24  GLUCOSE 169* 165*  BUN 14 13  CREATININE 0.78 0.63  CALCIUM 9.0 9.0   LFT Recent Labs    12/21/16 1551  PROT 7.2  ALBUMIN 4.0  AST 27  ALT 29  ALKPHOS 161*  BILITOT 0.5    Dg Abd Acute W/chest  Result Date: 12/21/2016 CLINICAL DATA:  Pt states she is having a diverticulitis flare up. Pt c/o of nausea, abdominal distension, and abdominal pain. No hx of surgeries. Denies constipation and diarrhea. EXAM: DG ABDOMEN ACUTE W/ 1V CHEST COMPARISON:  02/27/2014 FINDINGS: Patient's left-sided  transvenous pacer with leads to the right atrium and right ventricle. Status post median sternotomy. The heart is normal in size. There are no focal consolidations or pleural effusions. There is stable focal pleural thickening at the left lung apex since 2014, consistent with benign cause. Bowel gas pattern is nonobstructive. There is a large amount of stool throughout the colon. No abnormal calcifications or evidence for organomegaly. IMPRESSION: 1.  No evidence for acute cardiopulmonary abnormality. 2. Large stool burden. Electronically Signed   By: Norva PavlovElizabeth  Brown M.D.   On: 12/21/2016 19:22    ASSESSMENT / PLAN:   66 yo female with painless rectal bleeding. Mild drop in hgb overnight but still overall stable 11.8 >>>11.5 >>>11.7 >>>11.1. Using the Anusol. Difficult to know if slow, low volume diverticular hemorrhage but suspect it is. She has known diverticulosis, no polyps or cancer on colonoscopy 3 years ago. She is having less than her typical amount of daily BMs because not taking PO but  passing some blood with flatus.  -continue Anusol cream per rectum  -would continue to monitor her. Keep on clears    Discharge Planning Diet:regular Anticoagulation and antiplatelets:  NA Discharge Medications: Anusol HC 2.5% Apply inside rectum BID for another 5 days Follow up: with me 12/17 at 10am    Principal Problem:   GI bleeding Active Problems:   Essential hypertension   Chronic diastolic heart failure (HCC)  Diabetes (HCC)   Pacemaker-Medtronic  MRI compatible    LOS: 1 day   Willette ClusterPaula Guenther ,NP 12/23/2016, 10:33 AM  Pager number [phone]    Attending physician's note   I have taken an interval history, reviewed the chart and examined the patient. I agree with the Advanced Practitioner's note, impression and recommendations.  Persistent low volume LGI bleed, suspected diverticular bleed. [facility].  Her daughter  and granddaughter have a clotting disorder. Check PT/INR, PTT. Will discuss additional evaluation with hospitalist.   Claudette HeadMalcolm Stark, MD Clementeen GrahamFACG [phone] Mon-Fri 8a-5p [phone] after 5p, weekends, holidays

## 2016-12-23 NOTE — Progress Notes (Signed)
CRITICAL VALUE ALERT  Critical Value: Fibrinogen 90  Date & Time Notified: 12/23/2016 2033  Provider Notified: M. Lynch  Orders Received: Provider to assess and address

## 2016-12-23 NOTE — Progress Notes (Signed)
[facility]  [phone]) [phone]   HEMATOLOGY ONCOLOGY INPATIENT CONSULTATION   Stacey [name]  DOB: 1950/04/28  MR#: [id number]  CSN#: [id number]    Requesting Physician: [name]   Patient Care Team: [name], MD as PCP - General (Internal Medicine) [name], MD as Consulting Physician (Cardiology) [name], MD as Consulting Physician (Gastroenterology)  Reason for consult: rule out bleeding disorder   History of present illness:   66 y.o.female with significant medical history of sickle cell, renal artery aneurysm, HTN, HLD diverticulosis, depression, COPD, pacemaker, chronic diastolic CHF, and CAD. She presented to the ED on 12/21/17 for Lower GI bleed, LLQ abdominal pain.  She was evaluated by GI Dr. Russella Dar who feels her GI bleeding is likely related to her know diverticulosis.  She had a similar no GI bleeding several years ago.  She has a mild anemia, has not required any blood transfusion. I was called to see patient to rule out bleeding disorder.  Pt states she has had bleeding issues since her childhood, especially after surgeries or injuries.  She had endometrial ablation in her 30s, developed a severe bleeding, which required hysterectomy.  She also had prolonged bleeding after her cardiac surgery in 2012, which required blood transfusion.  She has had 3 vaginal delivery, bleeding was not excessive, did not require blood transfusion.  When she was in the hospital for cardiac surgery in 2020, she had a fibrinogen level checked several times, which or below 100.  She received cryoprecipitation after her heart surgery.  1 of her daughters and granddaughters also had a similar bleeding disorder, her daughter was tested and found to have a gene mutation which corresponding to her bleeding disorder.  Patient is not sure which gene was positive.  MEDICAL HISTORY:  Past Medical History:  Diagnosis Date  . ANGIOEDEMA  03/07/2008   a. with ACE-I  . ANXIETY 09/16/2006  . ASTHMA 08/01/2006  . CAD (coronary artery disease)    a. 01/2010 : Minimal plaque at cardiac catheterization - CFX 20%, EF 70%;  b. 08/2012 Cath: Nl Cors, EF 65%.  . Chronic diastolic CHF (congestive heart failure) (HCC) 04/06/2010   a. In setting of HOCM.  Marland Kitchen Complete heart block (HCC) 02/26/2014   a. s/p MDT dual chamber pacemaker 02/2014  . COPD (chronic obstructive pulmonary disease) (HCC)   . DEPRESSION 09/16/2006  . DIVERTICULOSIS, COLON, WITH HEMORRHAGE 04/06/2010  . DM (diabetes mellitus) in pregnancy, delivered w/postpartum condition 08/30/2010  . HYPERLIPIDEMIA 08/01/2006  . HYPERTENSION 08/01/2006  . 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 s/p septal myomectomy 4/12 at [facility] with Dr. Silvestre Mesi;  b. echo 5/12: EF 60-65%, LVOT peak 18 mmHg; grade 1 diast dysfxn, mild SAM (improved since myomectomy;  c. 03/2012 Echo: EF 60-65%, mod-sev basal septal asymm hypertrophy, basal septal HK, LVOT grad , Gr 1 DD, , SAM, Mild MR, nl RV, PASP ; 08/2012 Echo: technically difficult, doubt LVOT obstruction, EF 60%, Gr 1 DD, mod-sev dil LA.  . Impaired glucose tolerance 06/25/2010  . LBBB (left bundle branch block) 06/17/2010  . OBESITY 09/16/2006  . 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 SICKLE CELL ANEMIA 08/01/2006  . Type II or unspecified type diabetes mellitus without mention of complication, uncontrolled 08/30/2010  . VENTRAL HERNIA 12/07/2006    SURGICAL HISTORY: Past Surgical History:  Procedure Laterality Date  . ABDOMINAL HYSTERECTOMY  1987  . CARDIAC SURGERY    . ELECTROPHYSIOLOGY STUDY N/A 09/12/2012  Procedure: ELECTROPHYSIOLOGY STUDY;  Surgeon: [facility] SalviaSteven C Klein, MD;  [facility];  Service: Cardiovascular;  Laterality: N/A;  . LEFT HEART CATH  Aug. 18, 2014   Medtronic heart device  . LEFT HEART CATHETERIZATION WITH CORONARY ANGIOGRAM N/A 09/10/2012   Procedure: LEFT HEART CATHETERIZATION WITH CORONARY ANGIOGRAM;  Surgeon: Kathleene Hazelhristopher D  McAlhany, MD;  [facility];  Service: Cardiovascular;  Laterality: N/A;  . MYOMECTOMY     Septal  . PERMANENT PACEMAKER INSERTION N/A 02/26/2014   MDT Advisa dual chamber pacemaker implanted by Dr Graciela HusbandsKlein for heart block  . VENTRAL HERNIA REPAIR  06/2006    SOCIAL HISTORY: Social History   Socioeconomic History  . Marital status: Married    Spouse name: Not on file  . Number of children: 3  . Years of education: Not on file  . Highest education level: Not on file  Social Needs  . Financial resource strain: Not on file  . Food insecurity - worry: Not on file  . Food insecurity - inability: Not on file  . Transportation needs - medical: Not on file  . Transportation needs - non-medical: Not on file  Occupational History  . Occupation: retired Magazine features editorteacher    [employer] [employer]  Tobacco Use  . Smoking status: Former Smoker    Packs/day: 0.25    Years: 20.00    Pack years: 5.00    Types: Cigarettes    Last attempt to quit: 01/24/1998    Years since quitting: 18.9  . Smokeless tobacco: Never Used  . Tobacco comment: Quit smoking 2001. Smoked on and off for 20 years. Smoked 2-3 cigars daily  Substance and Sexual Activity  . Alcohol use: No    Alcohol/week: 0.0 oz  . Drug use: No  . Sexual activity: Not on file  Other Topics Concern  . Not on file  Social History Narrative   [place].  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    FAMILY HISTORY: Family History  Problem Relation Age of Onset  . Heart disease Father   . Heart attack Father   . Hypertension Father        before age 65  . Colon cancer Daughter        and bleeding disorder  . Hyperlipidemia Daughter   . Heart disease Daughter   . Cancer Daughter   . Heart attack Sister   . Hyperlipidemia Sister   . Stroke Sister   . Hypertension Sister   . Hyperlipidemia Brother   . Hypertension Brother   . Diabetes Paternal Grandmother   . Clotting disorder  Daughter   . Clotting disorder Other        granddaughter  . Hypertension Son     ALLERGIES:  is allergic to dairy aid [lactase]; metformin and related; ace inhibitors; aspirin; codeine; and soy allergy.  MEDICATIONS:  Current Facility-Administered Medications  Medication Dose Route Frequency Provider Last Rate Last Dose  . acetaminophen (TYLENOL) tablet 650 mg  650 mg Oral Q6H PRN Jonah BlueYates, Jennifer, MD       Or  . acetaminophen (TYLENOL) suppository 650 mg  650 mg Rectal Q6H PRN Jonah BlueYates, Jennifer, MD      . albuterol (PROVENTIL) (2.5 MG/3ML) 0.083% nebulizer solution 3 mL  3 mL Inhalation Q6H PRN Jonah BlueYates, Jennifer, MD      . amLODipine (NORVASC) tablet 10 mg  10 mg Oral Daily Jonah BlueYates, Jennifer, MD   10 mg at  12/23/16 1031  . atorvastatin (LIPITOR) tablet 40 mg  40 mg Oral q1800 Jonah BlueYates, Jennifer, MD   40 mg at 12/23/16 1732  . fenofibrate tablet 160 mg  160 mg Oral Daily Jonah BlueYates, Jennifer, MD   160 mg at 12/23/16 1031  . hydrocortisone (ANUSOL-HC) 2.5 % rectal cream   Rectal BID Meredith PelGuenther, Paula M, NP   1 application at 12/23/16 1031  . insulin aspart (novoLOG) injection 0-15 Units  0-15 Units Subcutaneous TID WC Jonah BlueYates, Jennifer, MD   3 Units at 12/23/16 1733  . insulin aspart (novoLOG) injection 0-5 Units  0-5 Units Subcutaneous QHS Jonah BlueYates, Jennifer, MD      . irbesartan Evlyn Kanner(AVAPRO) tablet 300 mg  300 mg Oral Noemi ChapelQHS Yates, Jennifer, MD   300 mg at 12/22/16 2154  . lactated ringers infusion   Intravenous Continuous Jonah BlueYates, Jennifer, MD 75 mL/hr at 12/22/16 1314    . loratadine (CLARITIN) tablet 10 mg  10 mg Oral Daily Jonah BlueYates, Jennifer, MD   10 mg at 12/23/16 1032  . metoprolol succinate (TOPROL-XL) 24 hr tablet 100 mg  100 mg Oral Daily Jonah BlueYates, Jennifer, MD   100 mg at 12/23/16 1032  . ondansetron (ZOFRAN) tablet 4 mg  4 mg Oral Q6H PRN Jonah BlueYates, Jennifer, MD       Or  . ondansetron [facility](ZOFRAN) injection 4 mg  4 mg Intravenous Q6H PRN Jonah BlueYates, Jennifer, MD      . Oxcarbazepine (TRILEPTAL) tablet 600 mg  600 mg Oral  BID Jonah BlueYates, Jennifer, MD   600 mg at 12/23/16 1032  . topiramate (TOPAMAX) tablet 100 mg  100 mg Oral Daily Jonah BlueYates, Jennifer, MD   100 mg at 12/23/16 1032    REVIEW OF SYSTEMS:   Constitutional: Denies fevers, chills or abnormal night sweats Eyes: Denies blurriness of vision, double vision or watery eyes Ears, nose, mouth, throat, and face: Denies mucositis or sore throat Respiratory: Denies cough, dyspnea or wheezes Cardiovascular: Denies palpitation, chest discomfort or lower extremity swelling Gastrointestinal:  Denies nausea, heartburn or change in bowel habits Skin: Denies abnormal skin rashes Lymphatics: Denies new lymphadenopathy or easy bruising Neurological:Denies numbness, tingling or new weaknesses Behavioral/Psych: Mood is stable, no new changes  All other systems were reviewed with the patient and are negative.  PHYSICAL EXAMINATION: ECOG PERFORMANCE STATUS: 1 - Symptomatic but completely ambulatory  Vitals:   12/23/16 0445 12/23/16 1338  BP: 137/71 (!) 146/60  Pulse: 67 68  Resp: 18 18  Temp: 98.9 F (37.2 C) 98.4 F (36.9 C)  SpO2: 97% 95%   Filed Weights   12/21/16 1549  Weight: 171 lb (77.6 kg)    GENERAL:alert, no distress and comfortable SKIN: skin color, texture, turgor are normal, no rashes or significant lesions EYES: normal, conjunctiva are pink and non-injected, sclera clear OROPHARYNX:no exudate, no erythema and lips, buccal mucosa, and tongue normal  NECK: supple, thyroid normal size, non-tender, without nodularity LYMPH:  no palpable lymphadenopathy in the cervical, axillary or inguinal LUNGS: clear to auscultation and percussion with normal breathing effort HEART: regular rate & rhythm and no murmurs and no lower extremity edema ABDOMEN:abdomen soft, non-tender and normal bowel sounds Musculoskeletal:no cyanosis of digits and no clubbing  PSYCH: alert & oriented x 3 with fluent speech NEURO: no focal motor/sensory deficits  LABORATORY DATA:   I have reviewed the data as listed Lab Results  Component Value Date   WBC 6.6 12/23/2016   HGB 11.1 (L) 12/23/2016   HCT 34.3 (L) 12/23/2016   MCV 77.4 (L)  12/23/2016   PLT 195 12/23/2016   Recent Labs    02/26/16 1055 12/21/16 1551 12/22/16 0140  NA 140 139 143  K 4.2 4.2 3.6  CL 104 108 112*  CO2 28 24 24   GLUCOSE 189* 169* 165*  BUN 13 14 13   CREATININE 0.71 0.78 0.63  CALCIUM 9.7 9.0 9.0  GFRNONAA  --  >60 >60  GFRAA  --  >60 >60  PROT 7.6 7.2  --   ALBUMIN 4.5 4.0  --   AST 23 27  --   ALT 35 29  --   ALKPHOS 166* 161*  --   BILITOT 0.5 0.5  --   BILIDIR 0.1  --   --     RADIOGRAPHIC STUDIES: I have personally reviewed the radiological images as listed and agreed with the findings in the report. Dg Abd Acute W/chest  Result Date: 12/21/2016 CLINICAL DATA:  Pt states she is having a diverticulitis flare up. Pt c/o of nausea, abdominal distension, and abdominal pain. No hx of surgeries. Denies constipation and diarrhea. EXAM: DG ABDOMEN ACUTE W/ 1V CHEST COMPARISON:  02/27/2014 FINDINGS: Patient's left-sided transvenous pacer with leads to the right atrium and right ventricle. Status post median sternotomy. The heart is normal in size. There are no focal consolidations or pleural effusions. There is stable focal pleural thickening at the left lung apex since 2014, consistent with benign cause. Bowel gas pattern is nonobstructive. There is a large amount of stool throughout the colon. No abnormal calcifications or evidence for organomegaly. IMPRESSION: 1.  No evidence for acute cardiopulmonary abnormality. 2. Large stool burden. Electronically Signed   By: Norva Pavlov M.D.   On: 12/21/2016 19:22    ASSESSMENT & PLAN: 66 y.o.female with significant medical history of sickle cell, renal artery aneurysm, HTN, HLD diverticulosis, depression, COPD, pacemaker, chronic diastolic CHF, and CAD. She presented to the ED on 12/21/17 for Lower GI bleed, LLQ abdominal pain.    1. Low GI bleeding, likely secondary to diverticulosis 2. Probable bleeding disorder, ? Hypofibrinogenemia  3. HTN, COPD, CHF, CAD   Recommendations: -I will order fibrinogen level tonight, and cryoprecipitate transfusion if fibrinogen <100, pt is agreeable with cryo  transfusion  -I ask pt to get her daughter's genetic trest report -I plan to do further genetic testing as outpt when her bleeding episodes resolve  -I will follow up    All questions were answered. The patient knows to call the clinic with any problems, questions or concerns.      Malachy Mood, MD 12/23/2016 6:46 PM

## 2016-12-23 NOTE — Progress Notes (Signed)
PROGRESS NOTE    Stacey [name]  [id number] DOB: 1950/11/04 DOA: 12/21/2016 PCP: Biagio Borg, MD   Brief Narrative: 66 y.o.femalewith medical history significant ofsickle cell; renal artery aneurysm; HTN; HLD; diverticulosis; depression; COPD; pacemaker; COPD; chronic diastolic CHF; and CAD presenting with BRBPR.  Assessment & Plan:  #Less GI bleed likely diverticular versus internal hemorrhoids.  Patient reported small amount of blood with a bowel movement.  She denied nausea vomiting or abdominal pain.  She reported hungry and wanted to advance diet.  I discussed with the GI who recommended to have a bleeding disorder evaluation.  Ordered a PT, INR, PTT, reticulocyte, LDH.  Hematology consult requested and discussed with Dr. Burr Medico. -Watch for bleeding and monitor labs. -Received steroid cream for presumed internal hemorrhoids.  Hypertension: Continue current medication.  Patient is on amlodipine, Avapro, metoprolol.  Monitor blood pressure.  Type 2 diabetes: Sliding scale.  Monitor blood sugar level.  Chronic diastolic congestive heart failure: Compensated.  Patient has a pacemaker for complete heart block.  DVT prophylaxis: SCD Code Status: Full code Family Communication: No family at bedside Disposition Plan: Currently admitted    Consultants:   GI  Hematology  Procedures: None Antimicrobials: None  Subjective: Seen and examined at bedside.  Denies headache, dizziness, nausea, vomiting, chest pain, shortness of breath.  Reported bowel movement with small amount of blood.  Painless.  Objective: Vitals:   12/22/16 0201 12/22/16 0540 12/22/16 1954 12/23/16 0445  BP: 136/79 127/82 (!) 144/66 137/71  Pulse: 63 72 60 67  Resp: '17 16 16 18  '$ Temp: 98.2 F (36.8 C) 98.6 F (37 C) 98.4 F (36.9 C) 98.9 F (37.2 C)  TempSrc: Oral Oral Oral Oral  SpO2: 99% 99% 96% 97%  Weight:      Height:        Intake/Output Summary (Last 24 hours) at  12/23/2016 1305 Last data filed at 12/23/2016 0802 Gross per 24 hour  Intake 687.5 ml  Output 3 ml  Net 684.5 ml   Filed Weights   12/21/16 1549  Weight: 77.6 kg (171 lb)    Examination:  General exam: Appears calm and comfortable  Respiratory system: Clear to auscultation. Respiratory effort normal. No wheezing or crackle Cardiovascular system: S1 & S2 heard, RRR.  No pedal edema. Gastrointestinal system: Abdomen is nondistended, soft and nontender. Normal bowel sounds heard. Central nervous system: Alert and oriented. No focal neurological deficits. Extremities: Symmetric 5 x 5 power. Skin: No rashes, lesions or ulcers Psychiatry: Judgement and insight appear normal. Mood & affect appropriate.     Data Reviewed: I have personally reviewed following labs and imaging studies  CBC: Recent Labs  Lab 12/21/16 1825 12/22/16 0137 12/22/16 0735 12/22/16 1404 12/22/16 1934 12/23/16 0208  WBC 7.3 7.0 6.5 6.3 6.8 6.6  NEUTROABS 4.3  --   --   --   --   --   HGB 11.9* 12.4 11.8* 11.5* 11.7* 11.1*  HCT 36.7 37.8 36.5 36.1 36.0 34.3*  MCV 77.4* 77.5* 77.8* 77.8* 77.4* 77.4*  PLT 199 199 199 195 214 0000000   Basic Metabolic Panel: Recent Labs  Lab 12/21/16 1551 12/22/16 0140  NA 139 143  K 4.2 3.6  CL 108 112*  CO2 24 24  GLUCOSE 169* 165*  BUN 14 13  CREATININE 0.78 0.63  CALCIUM 9.0 9.0   GFR: Estimated Creatinine Clearance: 73.3 mL/min (by C-G formula based on SCr of 0.63 mg/dL). Liver Function Tests: Recent Labs  Lab 12/21/16 1551  AST 27  ALT 29  ALKPHOS 161*  BILITOT 0.5  PROT 7.2  ALBUMIN 4.0   No results for input(s): LIPASE, AMYLASE in the last 168 hours. No results for input(s): AMMONIA in the last 168 hours. Coagulation Profile: No results for input(s): INR, PROTIME in the last 168 hours. Cardiac Enzymes: No results for input(s): CKTOTAL, CKMB, CKMBINDEX, TROPONINI in the last 168 hours. BNP (last 3 results) No results for input(s): PROBNP in  the last 8760 hours. HbA1C: Recent Labs    12/22/16 0137  HGBA1C 8.9*   CBG: Recent Labs  Lab 12/22/16 0752 12/22/16 1228 12/22/16 2141 12/23/16 0728 12/23/16 1132  GLUCAP 155* 108* 137* 168* 225*   Lipid Profile: No results for input(s): CHOL, HDL, LDLCALC, TRIG, CHOLHDL, LDLDIRECT in the last 72 hours. Thyroid Function Tests: No results for input(s): TSH, T4TOTAL, FREET4, T3FREE, THYROIDAB in the last 72 hours. Anemia Panel: No results for input(s): VITAMINB12, FOLATE, FERRITIN, TIBC, IRON, RETICCTPCT in the last 72 hours. Sepsis Labs: No results for input(s): PROCALCITON, LATICACIDVEN in the last 168 hours.  No results found for this or any previous visit (from the past 240 hour(s)).       Radiology Studies: Dg Abd Acute W/chest  Result Date: 12/21/2016 CLINICAL DATA:  Pt states she is having a diverticulitis flare up. Pt c/o of nausea, abdominal distension, and abdominal pain. No hx of surgeries. Denies constipation and diarrhea. EXAM: DG ABDOMEN ACUTE W/ 1V CHEST COMPARISON:  02/27/2014 FINDINGS: Patient's left-sided transvenous pacer with leads to the right atrium and right ventricle. Status post median sternotomy. The heart is normal in size. There are no focal consolidations or pleural effusions. There is stable focal pleural thickening at the left lung apex since 2014, consistent with benign cause. Bowel gas pattern is nonobstructive. There is a large amount of stool throughout the colon. No abnormal calcifications or evidence for organomegaly. IMPRESSION: 1.  No evidence for acute cardiopulmonary abnormality. 2. Large stool burden. Electronically Signed   By: Nolon Nations M.D.   On: 12/21/2016 19:22        Scheduled Meds: . amLODipine  10 mg Oral Daily  . atorvastatin  40 mg Oral q1800  . fenofibrate  160 mg Oral Daily  . hydrocortisone   Rectal BID  . insulin aspart  0-15 Units Subcutaneous TID WC  . insulin aspart  0-5 Units Subcutaneous QHS  .  irbesartan  300 mg Oral QHS  . loratadine  10 mg Oral Daily  . metoprolol succinate  100 mg Oral Daily  . oxcarbazepine  600 mg Oral BID  . topiramate  100 mg Oral Daily   Continuous Infusions: . lactated ringers 75 mL/hr at 12/22/16 1314     LOS: 1 day    Stacey Casella Tanna Furry, MD [facility] Pager [phone]  If 7PM-7AM, please contact night-coverage [email or web address] Password [email or web address] 12/23/2016, 1:05 PM

## 2016-12-24 LAB — CBC
HCT: 34.4 % — ABNORMAL LOW (ref 36.0–46.0)
Hemoglobin: 11.2 g/dL — ABNORMAL LOW (ref 12.0–15.0)
MCH: 25 pg — ABNORMAL LOW (ref 26.0–34.0)
MCHC: 32.6 g/dL (ref 30.0–36.0)
MCV: 76.8 fL — ABNORMAL LOW (ref 78.0–100.0)
Platelets: 185 10*3/uL (ref 150–400)
RBC: 4.48 MIL/uL (ref 3.87–5.11)
RDW: 14.7 % (ref 11.5–15.5)
WBC: 5.6 10*3/uL (ref 4.0–10.5)

## 2016-12-24 LAB — GLUCOSE, CAPILLARY
Glucose-Capillary: 183 mg/dL — ABNORMAL HIGH (ref 65–99)
Glucose-Capillary: 151 mg/dL — ABNORMAL HIGH (ref 65–99)
Glucose-Capillary: 156 mg/dL — ABNORMAL HIGH (ref 65–99)
Glucose-Capillary: 230 mg/dL — ABNORMAL HIGH (ref 65–99)

## 2016-12-24 NOTE — Progress Notes (Addendum)
     [facility] Progress Note  Chief Complaint:    Rectal bleeding  Subjective: Bleeding has decreased. She feels okay  Objective:  Vital signs in last 24 hours: Temp:  [97.6 F (36.4 C)-98.6 F (37 C)] 98.1 F (36.7 C) (12/01 0426) Pulse Rate:  [60-72] 72 (12/01 0426) Resp:  [18-20] 19 (12/01 0426) BP: (107-153)/(53-84) 107/84 (12/01 0426) SpO2:  [93 %-96 %] 96 % (12/01 0426) Last BM Date: 12/23/16 General:   Alert, well-developed, white in NAD EENT:  Normal hearing, non icteric sclera, conjunctive pink.  Heart:  Regular rate and rhythm; + murmur, no lower extremity edema Pulm: Normal respiratory effort, lungs CTA bilaterally without wheezes or crackles. Abdomen:  Soft, nondistended, nontender.  Normal bowel sounds, no masses felt. No hepatomegaly.    Neurologic:  Alert and  oriented x4;  grossly normal neurologically. Psych:  Pleasant, cooperative.  Normal mood and affect.   Intake/Output from previous day: 11/30 0701 - 12/01 0700 In: 4015.4 [P.O.:1080; I.V.:2686.4; Blood:249] Out: -  Intake/Output this shift: Total I/O In: 240 [P.O.:240] Out: -   Lab Results: Recent Labs    12/22/16 1934 12/23/16 0208 12/24/16 0709  WBC 6.8 6.6 5.6  HGB 11.7* 11.1* 11.2*  HCT 36.0 34.3* 34.4*  PLT 214 195 185   BMET Recent Labs    12/21/16 1551 12/22/16 0140  NA 139 143  K 4.2 3.6  CL 108 112*  CO2 24 24  GLUCOSE 169* 165*  BUN 14 13  CREATININE 0.78 0.63  CALCIUM 9.0 9.0   LFT Recent Labs    12/21/16 1551  PROT 7.2  ALBUMIN 4.0  AST 27  ALT 29  ALKPHOS 161*  BILITOT 0.5   PT/INR Recent Labs    12/23/16 1426  LABPROT 16.6*  INR 1.35    ASSESSMENT / PLAN:   1. 66 yo female with low volume rectal bleeding / hematochezia. Probably diverticular hemorrhage vrs hemorrhoidal. Bleeding has slowed since getting transfusion of cryoprecipitate. Hgb remains stable at 11.2 -Hopefully bleeding will subside soon and patient can be discharged  home.  -Complete Anusol Cream as directed  2. Bleeding disorder. Daughter reminded patient about [facility] of bleeding disorder and fact that patient had problems with bleeding after cardiac surgery. Hospital ordered Fibrinogen which returned low at 90, received cryoprecipitate. Recommended patient get Medic Alert bracelet at some point.    Principal Problem:   Lower GI bleed Active Problems:   Essential hypertension   Chronic diastolic heart failure ([place])   Diabetes ([place])   Pacemaker-Medtronic  MRI compatible    LOS: 2 days   Tye Savoy ,NP 12/24/2016, 9:06 AM  Pager number [phone]     Attending physician's note   I have taken an interval history, reviewed the chart and examined the patient. I agree with the Advanced Practitioner's note, impression and recommendations. Appreciate Hematology evaluation. Received cryoprecipitate. Hb=11.2. Bleeding has decreased and when bleeding resolves ok for discharge from GI standpoint.   Lucio Edward, MD [facility] [phone] Mon-Fri 8a-5p [phone] after 5p, weekends, holidays

## 2016-12-24 NOTE — Progress Notes (Signed)
PROGRESS NOTE    Stacey [name]  [id number] DOB: [birth year] DOA: 12/21/2016 PCP: Biagio Borg, MD   Brief Narrative: 66 y.o.femalewith medical history significant ofsickle cell; renal artery aneurysm; HTN; HLD; diverticulosis; depression; COPD; pacemaker; COPD; chronic diastolic CHF; and CAD presenting with BRBPR.  Assessment & Plan:  # painless GI bleed likely diverticular versus internal hemorrhoids.  Patient is still having a small amount of bleeding with a bowel movement.  Hemoglobin is stable.  Tolerating diet well.  Patient reported family history of bleeding disorder.  She was found to have a low fibrinogen therefore treated with cryo.  I discussed with oncologist today recommended to repeat fibrinogen in the morning.  If fibrinogen is less than 100 plan to transfuse another cryo.  Repeat CBC in the morning.  If stable and no sign of bleeding patient may go home with outpatient follow-up. -Received steroid cream for presumed internal hemorrhoids.  #Hypertension: Continue current medication.  Patient is on amlodipine, Avapro, metoprolol.  Monitor blood pressure.  #Type 2 diabetes: Sliding scale.  Monitor blood sugar level.  #Chronic diastolic congestive heart failure: Compensated.  Patient has a pacemaker for complete heart block.  DVT prophylaxis: SCD Code Status: Full code Family Communication: No family at bedside Disposition Plan: Currently admitted    Consultants:   GI  Hematology  Procedures: None Antimicrobials: None  Subjective: Seen and examined at bedside.  Patient reported a bowel movement today with some blood in it.  No nausea vomiting chest pain shortness of breath. Objective: Vitals:   12/24/16 0150 12/24/16 0208 12/24/16 0245 12/24/16 0426  BP: (!) 129/54 (!) 129/55 129/60 107/84  Pulse: 60 61 63 72  Resp: '18 18 20 19  '$ Temp: 98.1 F (36.7 C) 98.1 F (36.7 C) 97.6 F (36.4 C) 98.1 F (36.7 C)  TempSrc: Oral Oral Oral Oral    SpO2: 95% 95% 96% 96%  Weight:      Height:        Intake/Output Summary (Last 24 hours) at 12/24/2016 1304 Last data filed at 12/24/2016 [id number] Gross per 24 hour  Intake 3655.42 ml  Output --  Net 3655.42 ml   Filed Weights   12/21/16 1549  Weight: 77.6 kg (171 lb)    Examination:  General exam: Lying in bed comfortable, not in distress Respiratory system: Clear bilateral, no wheezing Cardiovascular system: Regular rate rhythm, S1-S2 normal.  No pedal edema Gastrointestinal system: Abdomen soft, nontender, nondistended.  Bowel sounds positive.   Central nervous system: Alert and oriented. No focal neurological deficits. Skin: No rashes, lesions or ulcers Psychiatry: Judgement and insight appear normal. Mood & affect appropriate.     Data Reviewed: I have personally reviewed following labs and imaging studies  CBC: Recent Labs  Lab 12/21/16 1825  12/22/16 0735 12/22/16 1404 12/22/16 1934 12/23/16 0208 12/24/16 0709  WBC 7.3   < > 6.5 6.3 6.8 6.6 5.6  NEUTROABS 4.3  --   --   --   --   --   --   HGB 11.9*   < > 11.8* 11.5* 11.7* 11.1* 11.2*  HCT 36.7   < > 36.5 36.1 36.0 34.3* 34.4*  MCV 77.4*   < > 77.8* 77.8* 77.4* 77.4* 76.8*  PLT 199   < > 199 195 214 195 185   < > = values in this interval not displayed.   Basic Metabolic Panel: Recent Labs  Lab 12/21/16 1551 12/22/16 0140  NA 139 143  K 4.2 3.6  CL 108 112*  CO2 24 24  GLUCOSE 169* 165*  BUN 14 13  CREATININE 0.78 0.63  CALCIUM 9.0 9.0   GFR: Estimated Creatinine Clearance: 73.3 mL/min (by C-G formula based on SCr of 0.63 mg/dL). Liver Function Tests: Recent Labs  Lab 12/21/16 1551  AST 27  ALT 29  ALKPHOS 161*  BILITOT 0.5  PROT 7.2  ALBUMIN 4.0   No results for input(s): LIPASE, AMYLASE in the last 168 hours. No results for input(s): AMMONIA in the last 168 hours. Coagulation Profile: Recent Labs  Lab 12/23/16 1426  INR 1.35   Cardiac Enzymes: No results for input(s): CKTOTAL,  CKMB, CKMBINDEX, TROPONINI in the last 168 hours. BNP (last 3 results) No results for input(s): PROBNP in the last 8760 hours. HbA1C: Recent Labs    12/22/16 0137  HGBA1C 8.9*   CBG: Recent Labs  Lab 12/23/16 1132 12/23/16 1731 12/23/16 2109 12/24/16 0755 12/24/16 1207  GLUCAP 225* 187* 192* 183* 151*   Lipid Profile: No results for input(s): CHOL, HDL, LDLCALC, TRIG, CHOLHDL, LDLDIRECT in the last 72 hours. Thyroid Function Tests: No results for input(s): TSH, T4TOTAL, FREET4, T3FREE, THYROIDAB in the last 72 hours. Anemia Panel: Recent Labs    12/23/16 1426  RETICCTPCT 3.3*   Sepsis Labs: No results for input(s): PROCALCITON, LATICACIDVEN in the last 168 hours.  No results found for this or any previous visit (from the past 240 hour(s)).       Radiology Studies: No results found.      Scheduled Meds: . amLODipine  10 mg Oral Daily  . atorvastatin  40 mg Oral q1800  . fenofibrate  160 mg Oral Daily  . hydrocortisone   Rectal BID  . insulin aspart  0-15 Units Subcutaneous TID WC  . insulin aspart  0-5 Units Subcutaneous QHS  . irbesartan  300 mg Oral QHS  . loratadine  10 mg Oral Daily  . metoprolol succinate  100 mg Oral Daily  . oxcarbazepine  600 mg Oral BID  . topiramate  100 mg Oral Daily   Continuous Infusions: . lactated ringers 75 mL/hr at 12/22/16 1314     LOS: 2 days    Santa Abdelrahman Tanna Furry, MD [facility] Pager [phone]  If 7PM-7AM, please contact night-coverage [email or web address] Password [email or web address] 12/24/2016, 1:04 PM

## 2016-12-24 NOTE — Progress Notes (Signed)
Orlene ErmChristine O Crawford-Fewell   DOB:05/02/1950   ZO#:[id number]#:[id number]   JWJ#:[id number]#:[id number]  Hematology follow-up  Subjective: Patient received 2 pooled units cryo last night, tolerated well.  She just had a normal bowel movement, formed, brown/yellow, without blood, before I saw her this morning. No other complains. H/H stable    Objective:  Vitals:   12/24/16 0426 12/24/16 1336  BP: 107/84 (!) 136/53  Pulse: 72 67  Resp: 19 20  Temp: 98.1 F (36.7 C) 98.4 F (36.9 C)  SpO2: 96% 95%    Body mass index is 25.25 kg/m.  Intake/Output Summary (Last 24 hours) at 12/24/2016 1623 Last data filed at 12/24/2016 1507 Gross per 24 hour  Intake 4770.42 ml  Output -  Net 4770.42 ml     Sclerae unicteric  Oropharynx clear  No peripheral adenopathy  Lungs clear -- no rales or rhonchi  Heart regular rate and rhythm  Abdomen benign  MSK no focal spinal tenderness, no peripheral edema  Neuro nonfocal    CBG (last 3)  Recent Labs    12/23/16 2109 12/24/16 0755 12/24/16 1207  GLUCAP 192* 183* 151*     Labs:  Lab Results  Component Value Date   WBC 5.6 12/24/2016   HGB 11.2 (L) 12/24/2016   HCT 34.4 (L) 12/24/2016   MCV 76.8 (L) 12/24/2016   PLT 185 12/24/2016   NEUTROABS 4.3 12/21/2016   CMP Latest Ref Rng & Units 12/22/2016 12/21/2016 02/26/2016  Glucose 65 - 99 mg/dL 621(H165(H) 086(V169(H) 784(O189(H)  BUN 6 - 20 mg/dL 13 14 13   Creatinine 0.44 - 1.00 mg/dL 9.620.63 9.520.78 8.410.71  Sodium 135 - 145 mmol/L 143 139 140  Potassium 3.5 - 5.1 mmol/L 3.6 4.2 4.2  Chloride 101 - 111 mmol/L 112(H) 108 104  CO2 22 - 32 mmol/L 24 24 28   Calcium 8.9 - 10.3 mg/dL 9.0 9.0 9.7  Total Protein 6.5 - 8.1 g/dL - 7.2 7.6  Total Bilirubin 0.3 - 1.2 mg/dL - 0.5 0.5  Alkaline Phos 38 - 126 U/L - 161(H) 166(H)  AST 15 - 41 U/L - 27 23  ALT 14 - 54 U/L - 29 35     Urine Studies No results for input(s): UHGB, CRYS in the last 72 hours.  Invalid input(s): UACOL, UAPR, USPG, UPH, UTP, UGL, UKET, UBIL, UNIT, UROB, ULEU, UEPI,  UWBC, URBC, UBAC, CAST, UCOM, BILUA  Basic Metabolic Panel: Recent Labs  Lab 12/21/16 1551 12/22/16 0140  NA 139 143  K 4.2 3.6  CL 108 112*  CO2 24 24  GLUCOSE 169* 165*  BUN 14 13  CREATININE 0.78 0.63  CALCIUM 9.0 9.0   GFR Estimated Creatinine Clearance: 73.3 mL/min (by C-G formula based on SCr of 0.63 mg/dL). Liver Function Tests: Recent Labs  Lab 12/21/16 1551  AST 27  ALT 29  ALKPHOS 161*  BILITOT 0.5  PROT 7.2  ALBUMIN 4.0   No results for input(s): LIPASE, AMYLASE in the last 168 hours. No results for input(s): AMMONIA in the last 168 hours. Coagulation profile Recent Labs  Lab 12/23/16 1426  INR 1.35    CBC: Recent Labs  Lab 12/21/16 1825  12/22/16 0735 12/22/16 1404 12/22/16 1934 12/23/16 0208 12/24/16 0709  WBC 7.3   < > 6.5 6.3 6.8 6.6 5.6  NEUTROABS 4.3  --   --   --   --   --   --   HGB 11.9*   < > 11.8* 11.5* 11.7* 11.1* 11.2*  HCT 36.7   < >  36.5 36.1 36.0 34.3* 34.4*  MCV 77.4*   < > 77.8* 77.8* 77.4* 77.4* 76.8*  PLT 199   < > 199 195 214 195 185   < > = values in this interval not displayed.   Cardiac Enzymes: No results for input(s): CKTOTAL, CKMB, CKMBINDEX, TROPONINI in the last 168 hours. BNP: Invalid input(s): POCBNP CBG: Recent Labs  Lab 12/23/16 1132 12/23/16 1731 12/23/16 2109 12/24/16 0755 12/24/16 1207  GLUCAP 225* 187* 192* 183* 151*   D-Dimer No results for input(s): DDIMER in the last 72 hours. Hgb A1c Recent Labs    12/22/16 0137  HGBA1C 8.9*   Lipid Profile No results for input(s): CHOL, HDL, LDLCALC, TRIG, CHOLHDL, LDLDIRECT in the last 72 hours. Thyroid function studies No results for input(s): TSH, T4TOTAL, T3FREE, THYROIDAB in the last 72 hours.  Invalid input(s): FREET3 Anemia work up Recent Labs    12/23/16 1426  RETICCTPCT 3.3*   Microbiology No results found for this or any previous visit (from the past 240 hour(s)).    Studies:  No results found.  Assessment: 66 y.o. female  with significant medical history of sickle cell trait, renal artery aneurysm, HTN, HLD diverticulosis, depression, COPD, pacemaker, chronic diastolic CHF, and CAD. She presented to the ED on 12/21/17 for Lower GI bleed, LLQ abdominal pain.   1. Low GI bleeding, likely secondary to diverticulosis 2. 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 HTN, COPD, CHF, CAD   Recommendations: -Her fibrinogen was 90 last night, she has received 2 pooled units cryo, rectal bleeding has slowed down.  Hemoglobin stable. -Repeat fibrinogen tomorrow morning. 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

## 2016-12-25 DIAGNOSIS — D62 Acute posthemorrhagic anemia: Secondary | ICD-10-CM

## 2016-12-25 DIAGNOSIS — K921 Melena: Secondary | ICD-10-CM

## 2016-12-25 DIAGNOSIS — D682 Hereditary deficiency of other clotting factors: Secondary | ICD-10-CM

## 2016-12-25 LAB — CBC
HCT: 32.6 % — ABNORMAL LOW (ref 36.0–46.0)
Hemoglobin: 10.5 g/dL — ABNORMAL LOW (ref 12.0–15.0)
MCH: 24.9 pg — ABNORMAL LOW (ref 26.0–34.0)
MCHC: 32.2 g/dL (ref 30.0–36.0)
MCV: 77.4 fL — ABNORMAL LOW (ref 78.0–100.0)
Platelets: 184 10*3/uL (ref 150–400)
RBC: 4.21 MIL/uL (ref 3.87–5.11)
RDW: 14.8 % (ref 11.5–15.5)
WBC: 5.2 10*3/uL (ref 4.0–10.5)

## 2016-12-25 LAB — GLUCOSE, CAPILLARY
Glucose-Capillary: 167 mg/dL — ABNORMAL HIGH (ref 65–99)
Glucose-Capillary: 178 mg/dL — ABNORMAL HIGH (ref 65–99)
Glucose-Capillary: 206 mg/dL — ABNORMAL HIGH (ref 65–99)
Glucose-Capillary: 144 mg/dL — ABNORMAL HIGH (ref 65–99)

## 2016-12-25 LAB — FIBRINOGEN: Fibrinogen: 160 mg/dL — ABNORMAL LOW (ref 210–475)

## 2016-12-25 MED ORDER — PEG-KCL-NACL-NASULF-NA ASC-C 100 G PO SOLR: 1.0000 | Freq: Once | ORAL | Status: DC

## 2016-12-25 MED ORDER — PEG-KCL-NACL-NASULF-NA ASC-C 100 G PO SOLR
0.5000 | Freq: Once | ORAL | Status: AC
  Administered 2016-12-25: 100 g via ORAL
  Filled 2016-12-25: qty 1

## 2016-12-25 MED ORDER — PEG-KCL-NACL-NASULF-NA ASC-C 100 G PO SOLR
0.5000 | Freq: Once | ORAL | Status: AC
Start: 2016-12-26 — End: 2016-12-26
  Administered 2016-12-26: 100 g via ORAL
  Filled 2016-12-25: qty 1

## 2016-12-25 NOTE — Progress Notes (Signed)
Stacey [name]   DOB:[birth year]   [id number]   [id number]  Hematology follow-up  Subjective: Patient had 3 BM yesterday and 1 this morning, still has small amount blood in stool.  She denies any other bleeding, hemoglobin slightly trending down.   Objective:  Vitals:   12/24/16 2008 12/25/16 0438  BP: (!) 132/55 (!) 137/52  Pulse: 63 61  Resp: 18 16  Temp: 98 F (36.7 C) 98 F (36.7 C)  SpO2: 95% 95%    Body mass index is 25.25 kg/m.  Intake/Output Summary (Last 24 hours) at 12/25/2016 1103 Last data filed at 12/25/2016 0600 Gross per 24 hour  Intake 2610 ml  Output --  Net 2610 ml     Sclerae unicteric  Oropharynx clear  No peripheral adenopathy  Lungs clear -- no rales or rhonchi  Heart regular rate and rhythm  Abdomen benign  MSK no focal spinal tenderness, no peripheral edema  Neuro nonfocal    CBG (last 3)  Recent Labs    12/24/16 1707 12/24/16 2007 12/25/16 0812  GLUCAP 156* 230* 167*     Labs:  CBC Latest Ref Rng & Units 12/25/2016 12/24/2016 12/23/2016  WBC 4.0 - 10.5 K/uL 5.2 5.6 6.6  Hemoglobin 12.0 - 15.0 g/dL 10.5(L) 11.2(L) 11.1(L)  Hematocrit 36.0 - 46.0 % 32.6(L) 34.4(L) 34.3(L)  Platelets 150 - 400 K/uL 184 185 195     CMP Latest Ref Rng & Units 12/22/2016 12/21/2016 02/26/2016  Glucose 65 - 99 mg/dL 165(H) 169(H) 189(H)  BUN 6 - 20 mg/dL '13 14 13  '$ Creatinine 0.44 - 1.00 mg/dL 0.63 0.78 0.71  Sodium 135 - 145 mmol/L 143 139 140  Potassium 3.5 - 5.1 mmol/L 3.6 4.2 4.2  Chloride 101 - 111 mmol/L 112(H) 108 104  CO2 22 - 32 mmol/L '24 24 28  '$ Calcium 8.9 - 10.3 mg/dL 9.0 9.0 9.7  Total Protein 6.5 - 8.1 g/dL - 7.2 7.6  Total Bilirubin 0.3 - 1.2 mg/dL - 0.5 0.5  Alkaline Phos 38 - 126 U/L - 161(H) 166(H)  AST 15 - 41 U/L - 27 23  ALT 14 - 54 U/L - 29 35   Fibrinogen  Order: [id number]  Status:  Final result Visible to patient:  No (Not Released) Next appt:  01/09/2017 at 10:00 AM in Gastroenterology Tye Savoy, NP)    Ref Range & Units 03:51 2d ago  Fibrinogen 210 - 475 mg/dL 160 Abnormally low   90 Critically low  CM         Urine Studies No results for input(s): UHGB, CRYS in the last 72 hours.  Invalid input(s): UACOL, UAPR, USPG, UPH, UTP, UGL, UKET, UBIL, UNIT, UROB, ULEU, UEPI, UWBC, URBC, UBAC, CAST, UCOM, BILUA  Basic Metabolic Panel: Recent Labs  Lab 12/21/16 1551 12/22/16 0140  NA 139 143  K 4.2 3.6  CL 108 112*  CO2 24 24  GLUCOSE 169* 165*  BUN 14 13  CREATININE 0.78 0.63  CALCIUM 9.0 9.0   GFR Estimated Creatinine Clearance: 73.3 mL/min (by C-G formula based on SCr of 0.63 mg/dL). Liver Function Tests: Recent Labs  Lab 12/21/16 1551  AST 27  ALT 29  ALKPHOS 161*  BILITOT 0.5  PROT 7.2  ALBUMIN 4.0   No results for input(s): LIPASE, AMYLASE in the last 168 hours. No results for input(s): AMMONIA in the last 168 hours. Coagulation profile Recent Labs  Lab 12/23/16 1426  INR 1.35    CBC: Recent Labs  Lab 12/21/16 1825  12/22/16 1404 12/22/16 1934 12/23/16 0208 12/24/16 0709 12/25/16 0351  WBC 7.3   < > 6.3 6.8 6.6 5.6 5.2  NEUTROABS 4.3  --   --   --   --   --   --   HGB 11.9*   < > 11.5* 11.7* 11.1* 11.2* 10.5*  HCT 36.7   < > 36.1 36.0 34.3* 34.4* 32.6*  MCV 77.4*   < > 77.8* 77.4* 77.4* 76.8* 77.4*  PLT 199   < > 195 214 195 185 184   < > = values in this interval not displayed.   Cardiac Enzymes: No results for input(s): CKTOTAL, CKMB, CKMBINDEX, TROPONINI in the last 168 hours. BNP: Invalid input(s): POCBNP CBG: Recent Labs  Lab 12/24/16 0755 12/24/16 1207 12/24/16 1707 12/24/16 2007 12/25/16 0812  GLUCAP 183* 151* 156* 230* 167*   D-Dimer No results for input(s): DDIMER in the last 72 hours. Hgb A1c No results for input(s): HGBA1C in the last 72 hours. Lipid Profile No results for input(s): CHOL, HDL, LDLCALC, TRIG, CHOLHDL, LDLDIRECT in the last 72 hours. Thyroid function studies No results for input(s): TSH, T4TOTAL, T3FREE,  THYROIDAB in the last 72 hours.  Invalid input(s): FREET3 Anemia work up Recent Labs    12/23/16 1426  RETICCTPCT 3.3*   Microbiology No results found for this or any previous visit (from the past 240 hour(s)).    Studies:  No results found.  Assessment: 66 y.o. female with significant medical history of sickle cell trait, renal artery aneurysm, HTN, HLD diverticulosis, depression, COPD, pacemaker, chronic diastolic CHF, and CAD. She presented to the ED on 12/21/17 for Lower GI bleed, LLQ abdominal pain.   1. Low GI bleeding, likely secondary to diverticulosis 2. 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 HTN, COPD, CHF, CAD   Recommendations: -Repeated fibrinogen level went up to 160 this morning, no need for additional cryo for now -She still has a small rectal bleeding, I think her bleeding mainly not related to hypofibrinogenemia.  Her rectal bleeding did not improve after cryo transfusion -I will defer colonoscopy to Dr. Fuller Plan, I personally feels she may need one given the ongoing bleeding  -I will check her iron study tomorrow, to see if she needs iv iron, I suggest her to start oral iron after blood draw tomorrow  -continue monitoring    Truitt Merle, MD 12/25/2016  11:03 AM

## 2016-12-25 NOTE — H&P (View-Only) (Signed)
     [facility] Progress Note  Chief Complaint:    Rectal bleeding  Subjective: Feels okay but still having blood in stool.   Objective:  Vital signs in last 24 hours: Temp:  [98 F (36.7 C)-98.3 F (36.8 C)] 98.3 F (36.8 C) (12/02 1402) Pulse Rate:  [61-63] 63 (12/02 1402) Resp:  [16-18] 16 (12/02 1402) BP: (132-155)/(52-75) 155/75 (12/02 1402) SpO2:  [95 %-97 %] 97 % (12/02 1402) Last BM Date: 12/24/16 General:   Alert, well-developed, black female in NAD EENT:  Normal hearing, non icteric sclera, conjunctive pink.  Heart:  Regular rate and rhythm; + murmur Pulm: Normal respiratory effort Abdomen:  Soft, nondistended, nontender.  Normal bowel sounds, no masses felt. No hepatomegaly.    Neurologic:  Alert and  oriented x4;  grossly normal neurologically. Psych:  Pleasant, cooperative.  Normal mood and affect.   Intake/Output from previous day: 12/01 0701 - 12/02 0700 In: 2850 [P.O.:1075; I.V.:1775] Out: -  Intake/Output this shift: Total I/O In: 240 [P.O.:240] Out: -   Lab Results: Recent Labs    12/23/16 0208 12/24/16 0709 12/25/16 0351  WBC 6.6 5.6 5.2  HGB 11.1* 11.2* 10.5*  HCT 34.3* 34.4* 32.6*  PLT 195 185 184    PT/INR Recent Labs    12/23/16 1426  LABPROT 16.6*  INR 1.35    ASSESSMENT / PLAN:   1. 66 yo female with ongoing, low volume painless rectal bleeding. Hgb drifted mildly overnight 11.2 >>>10.5. Has had a couple of BMs with blood today. Thought bleeding was subsiding after getting cryo but that didn't pan out. This is 4th day of her hospitalization and bleeding and she was bleeding for 4 days prior to admission.  Her fibrinogen level is normal after the cryo.  -will proceed with colonoscopy in am to further evaluate ongoing bleeding. The risks and benefits of the procedure were discussed and the patient agrees to proceed.   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   3. FMH of colon cancer, she is up to date on  surveillance exam. Last colonoscopy 3 years ago.   4. Pacemaker / chronic diastolic heart failure.    Principal Problem:   Lower GI bleed Active Problems:   Essential hypertension   Chronic diastolic heart failure (HCC)   Diabetes (HCC)   Pacemaker-[employer]  MRI compatible    LOS: 3 days   Paula Guenther, NP 12/25/2016, 2:32 PM    Attending physician's note   I have taken an interval history, reviewed the chart and examined the patient. I agree with the Advanced Practitioner's note, impression and recommendations. Persistent painless, low volume GI bleeding. Hb driftin lower. Fibrinogen is above 100 however bleeding persist. Colonoscopy tomorrow.   Nuala Chiles, MD FACG [phone] Mon-Fri 8a-5p [phone] after 5p, weekends, holidays Pager number [phone]     

## 2016-12-25 NOTE — Progress Notes (Signed)
PROGRESS NOTE    Stacey [name]  ZOX:[id number] DOB: 07/04/1950 DOA: 12/21/2016 PCP: Corwin LevinsJohn, James W, MD   Brief Narrative: 66 y.o.femalewith medical history significant ofsickle cell; renal artery aneurysm; HTN; HLD; diverticulosis; depression; COPD; pacemaker; COPD; chronic diastolic CHF; and CAD presenting with BRBPR.  Assessment & Plan:  # painless GI bleed likely diverticular versus internal hemorrhoids.  -Patient reported passing blood clots today with drop in hemoglobin to 10.5.  Patient received cryo for low fibrinogen level.  The repeat fibrinogen level today 160.  As per Dr. Mosetta PuttFeng, transfuse cryo if fibrinogen level is less than 100.  Since patient continued to have GI bleed with a drop in hemoglobin, I discussed with Dr. Russella DarStark from GI. -Patient has been following by GI and oncology. Check iron studies and repeat CBC in the morning.  Patient has no nausea vomiting or abdominal pain.  Tolerating diet well.  -Received steroid cream for presumed internal hemorrhoids.  #Hypertension: Continue current medication.  Patient is on amlodipine, Avapro, metoprolol.  Monitor blood pressure.  #Type 2 diabetes: Sliding scale.  Monitor blood sugar level.  #Chronic diastolic congestive heart failure: Compensated.  Patient has a pacemaker for complete heart block.  DVT prophylaxis: SCD Code Status: Full code Family Communication: No family at bedside Disposition Plan: Currently admitted    Consultants:   GI  Hematology  Procedures: None Antimicrobials: None  Subjective: Seen and examined at bedside.  One bowel movement today with blood clot.  Drop in hemoglobin.  Denies headache, dizziness, nausea, vomiting, chest pain, shortness of breath, abdominal pain.  Tolerating diet well. Objective: Vitals:   12/24/16 0426 12/24/16 1336 12/24/16 2008 12/25/16 0438  BP: 107/84 (!) 136/53 (!) 132/55 (!) 137/52  Pulse: 72 67 63 61  Resp: 19 20 18 16   Temp: 98.1 F (36.7 C)  98.4 F (36.9 C) 98 F (36.7 C) 98 F (36.7 C)  TempSrc: Oral Oral Oral Oral  SpO2: 96% 95% 95% 95%  Weight:      Height:        Intake/Output Summary (Last 24 hours) at 12/25/2016 1054 Last data filed at 12/25/2016 0600 Gross per 24 hour  Intake 2610 ml  Output -  Net 2610 ml   Filed Weights   12/21/16 1549  Weight: 77.6 kg (171 lb)    Examination:  General exam: Sitting in bed comfortable, not in distress Respiratory system: Clear bilateral, no wheezing Cardiovascular system: Regular rate rhythm, S1-S2 normal.  No pedal edema Gastrointestinal system: Abdomen soft, nontender, nondistended.  Bowel sounds positive. Central nervous system: Alert and oriented. No focal neurological deficits. Skin: No rashes, lesions or ulcers Psychiatry: Judgement and insight appear normal. Mood & affect appropriate.     Data Reviewed: I have personally reviewed following labs and imaging studies  CBC: Recent Labs  Lab 12/21/16 1825  12/22/16 1404 12/22/16 1934 12/23/16 0208 12/24/16 0709 12/25/16 0351  WBC 7.3   < > 6.3 6.8 6.6 5.6 5.2  NEUTROABS 4.3  --   --   --   --   --   --   HGB 11.9*   < > 11.5* 11.7* 11.1* 11.2* 10.5*  HCT 36.7   < > 36.1 36.0 34.3* 34.4* 32.6*  MCV 77.4*   < > 77.8* 77.4* 77.4* 76.8* 77.4*  PLT 199   < > 195 214 195 185 184   < > = values in this interval not displayed.   Basic Metabolic Panel: Recent Labs  Lab 12/21/16 1551 12/22/16  0140  NA 139 143  K 4.2 3.6  CL 108 112*  CO2 24 24  GLUCOSE 169* 165*  BUN 14 13  CREATININE 0.78 0.63  CALCIUM 9.0 9.0   GFR: Estimated Creatinine Clearance: 73.3 mL/min (by C-G formula based on SCr of 0.63 mg/dL). Liver Function Tests: Recent Labs  Lab 12/21/16 1551  AST 27  ALT 29  ALKPHOS 161*  BILITOT 0.5  PROT 7.2  ALBUMIN 4.0   No results for input(s): LIPASE, AMYLASE in the last 168 hours. No results for input(s): AMMONIA in the last 168 hours. Coagulation Profile: Recent Labs  Lab  12/23/16 1426  INR 1.35   Cardiac Enzymes: No results for input(s): CKTOTAL, CKMB, CKMBINDEX, TROPONINI in the last 168 hours. BNP (last 3 results) No results for input(s): PROBNP in the last 8760 hours. HbA1C: No results for input(s): HGBA1C in the last 72 hours. CBG: Recent Labs  Lab 12/24/16 0755 12/24/16 1207 12/24/16 1707 12/24/16 2007 12/25/16 0812  GLUCAP 183* 151* 156* 230* 167*   Lipid Profile: No results for input(s): CHOL, HDL, LDLCALC, TRIG, CHOLHDL, LDLDIRECT in the last 72 hours. Thyroid Function Tests: No results for input(s): TSH, T4TOTAL, FREET4, T3FREE, THYROIDAB in the last 72 hours. Anemia Panel: Recent Labs    12/23/16 1426  RETICCTPCT 3.3*   Sepsis Labs: No results for input(s): PROCALCITON, LATICACIDVEN in the last 168 hours.  No results found for this or any previous visit (from the past 240 hour(s)).       Radiology Studies: No results found.      Scheduled Meds: . amLODipine  10 mg Oral Daily  . atorvastatin  40 mg Oral q1800  . fenofibrate  160 mg Oral Daily  . hydrocortisone   Rectal BID  . insulin aspart  0-15 Units Subcutaneous TID WC  . insulin aspart  0-5 Units Subcutaneous QHS  . irbesartan  300 mg Oral QHS  . loratadine  10 mg Oral Daily  . metoprolol succinate  100 mg Oral Daily  . oxcarbazepine  600 mg Oral BID  . topiramate  100 mg Oral Daily   Continuous Infusions: . lactated ringers 75 mL/hr at 12/22/16 1314     LOS: 3 days    Hayze Gazda Jaynie CollinsPrasad Santo Zahradnik, MD [facility] Pager [phone]  If 7PM-7AM, please contact night-coverage [email or web address] Password [email or web address] 12/25/2016, 10:54 AM

## 2016-12-25 NOTE — Progress Notes (Signed)
     [facility] Progress Note  Chief Complaint:    Rectal bleeding  Subjective: Feels okay but still having blood in stool.   Objective:  Vital signs in last 24 hours: Temp:  [98 F (36.7 C)-98.3 F (36.8 C)] 98.3 F (36.8 C) (12/02 1402) Pulse Rate:  [61-63] 63 (12/02 1402) Resp:  [16-18] 16 (12/02 1402) BP: (132-155)/(52-75) 155/75 (12/02 1402) SpO2:  [95 %-97 %] 97 % (12/02 1402) Last BM Date: 12/24/16 General:   Alert, well-developed, black female in NAD EENT:  Normal hearing, non icteric sclera, conjunctive pink.  Heart:  Regular rate and rhythm; + murmur Pulm: Normal respiratory effort Abdomen:  Soft, nondistended, nontender.  Normal bowel sounds, no masses felt. No hepatomegaly.    Neurologic:  Alert and  oriented x4;  grossly normal neurologically. Psych:  Pleasant, cooperative.  Normal mood and affect.   Intake/Output from previous day: 12/01 0701 - 12/02 0700 In: 2850 [P.O.:1075; I.V.:1775] Out: -  Intake/Output this shift: Total I/O In: 240 [P.O.:240] Out: -   Lab Results: Recent Labs    12/23/16 0208 12/24/16 0709 12/25/16 0351  WBC 6.6 5.6 5.2  HGB 11.1* 11.2* 10.5*  HCT 34.3* 34.4* 32.6*  PLT 195 185 184    PT/INR Recent Labs    12/23/16 1426  LABPROT 16.6*  INR 1.35    ASSESSMENT / PLAN:   1. 66 yo female with ongoing, low volume painless rectal bleeding. Hgb drifted mildly overnight 11.2 >>>10.5. Has had a couple of BMs with blood today. Thought bleeding was subsiding after getting cryo but that didn't pan out. This is 4th day of her hospitalization and bleeding and she was bleeding for 4 days prior to admission.  Her fibrinogen level is normal after the cryo.  -will proceed with colonoscopy in am to further evaluate ongoing bleeding. The risks and benefits of the procedure were discussed and the patient agrees to proceed.   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   [facility] is up to date on  surveillance exam. Last colonoscopy 3 years ago.   4. Pacemaker / chronic diastolic heart failure.    Principal Problem:   Lower GI bleed Active Problems:   Essential hypertension   Chronic diastolic heart failure (HCC)   Diabetes (HCC)   Pacemaker-Medtronic  MRI compatible    LOS: 3 days   Willette ClusterPaula Guenther, NP 12/25/2016, 2:32 PM    Attending physician's note   I have taken an interval history, reviewed the chart and examined the patient. I agree with the Advanced Practitioner's note, impression and recommendations. Persistent painless, low volume GI bleeding. Hb driftin lower. Fibrinogen is above 100 however bleeding persist. Colonoscopy tomorrow.   Claudette HeadMalcolm Silas Sedam, MD Clementeen GrahamFACG [phone] Mon-Fri 8a-5p [phone] after 5p, weekends, holidays Pager number [phone]

## 2016-12-26 ENCOUNTER — Encounter (HOSPITAL_COMMUNITY)
Admission: EM | Disposition: A | Discharge status: Home / Self Care | Payer: Self-pay | Source: Home / Self Care | Attending: Nephrology

## 2016-12-26 ENCOUNTER — Encounter: Payer: Self-pay | Admitting: Hematology

## 2016-12-26 ENCOUNTER — Inpatient Hospital Stay (HOSPITAL_COMMUNITY): Payer: PPO | Admitting: Anesthesiology

## 2016-12-26 ENCOUNTER — Encounter (HOSPITAL_COMMUNITY): Payer: Self-pay | Admitting: *Deleted

## 2016-12-26 ENCOUNTER — Telehealth: Payer: Self-pay | Admitting: Hematology

## 2016-12-26 DIAGNOSIS — K5731 Diverticulosis of large intestine without perforation or abscess with bleeding: Principal | ICD-10-CM

## 2016-12-26 DIAGNOSIS — D62 Acute posthemorrhagic anemia: Secondary | ICD-10-CM

## 2016-12-26 HISTORY — PX: COLONOSCOPY: SHX5424

## 2016-12-26 LAB — COMPREHENSIVE METABOLIC PANEL
ALT: 37 U/L (ref 14–54)
AST: 41 U/L (ref 15–41)
Albumin: 4.1 g/dL (ref 3.5–5.0)
Alkaline Phosphatase: 145 U/L — ABNORMAL HIGH (ref 38–126)
Anion gap: 9 (ref 5–15)
BUN: 11 mg/dL (ref 6–20)
CO2: 21 mmol/L — ABNORMAL LOW (ref 22–32)
Calcium: 9.4 mg/dL (ref 8.9–10.3)
Chloride: 111 mmol/L (ref 101–111)
Creatinine, Ser: 0.62 mg/dL (ref 0.44–1.00)
GFR calc Af Amer: >60 mL/min (ref >60)
GFR calc non Af Amer: >60 mL/min (ref >60)
Glucose, Bld: 171 mg/dL — ABNORMAL HIGH (ref 65–99)
Potassium: 3.6 mmol/L (ref 3.5–5.1)
Sodium: 141 mmol/L (ref 135–145)
Total Bilirubin: 0.6 mg/dL (ref 0.3–1.2)
Total Protein: 7.2 g/dL (ref 6.5–8.1)

## 2016-12-26 LAB — BPAM CRYOPRECIPITATE
Blood Product Expiration Date: 201812010555
Blood Product Expiration Date: 201812010555
ISSUE DATE / TIME: 201812010025
ISSUE DATE / TIME: 201812010136
Unit Type and Rh: 5100
Unit Type and Rh: 5100

## 2016-12-26 LAB — PREPARE CRYOPRECIPITATE
Unit division: 0
Unit division: 0

## 2016-12-26 LAB — CBC
HCT: 34.9 % — ABNORMAL LOW (ref 36.0–46.0)
Hemoglobin: 11.5 g/dL — ABNORMAL LOW (ref 12.0–15.0)
MCH: 25.5 pg — ABNORMAL LOW (ref 26.0–34.0)
MCHC: 33 g/dL (ref 30.0–36.0)
MCV: 77.4 fL — ABNORMAL LOW (ref 78.0–100.0)
Platelets: 204 10*3/uL (ref 150–400)
RBC: 4.51 MIL/uL (ref 3.87–5.11)
RDW: 15 % (ref 11.5–15.5)
WBC: 6.7 10*3/uL (ref 4.0–10.5)

## 2016-12-26 LAB — FERRITIN: Ferritin: 137 ng/mL (ref 11–307)

## 2016-12-26 LAB — GLUCOSE, CAPILLARY
Glucose-Capillary: 113 mg/dL — ABNORMAL HIGH (ref 65–99)
Glucose-Capillary: 114 mg/dL — ABNORMAL HIGH (ref 65–99)
Glucose-Capillary: 161 mg/dL — ABNORMAL HIGH (ref 65–99)
Glucose-Capillary: 170 mg/dL — ABNORMAL HIGH (ref 65–99)
Glucose-Capillary: 177 mg/dL — ABNORMAL HIGH (ref 65–99)
Glucose-Capillary: 211 mg/dL — ABNORMAL HIGH (ref 65–99)
Glucose-Capillary: 241 mg/dL — ABNORMAL HIGH (ref 65–99)

## 2016-12-26 LAB — IRON AND TIBC
Iron: 70 ug/dL (ref 28–170)
Saturation Ratios: 24 % (ref 10.4–31.8)
TIBC: 293 ug/dL (ref 250–450)
UIBC: 223 ug/dL

## 2016-12-26 SURGERY — COLONOSCOPY
Anesthesia: Monitor Anesthesia Care

## 2016-12-26 MED ORDER — PROPOFOL 500 MG/50ML IV EMUL
INTRAVENOUS | Status: DC | PRN
  Administered 2016-12-26 (×15): 40 mg via INTRAVENOUS

## 2016-12-26 MED ORDER — LIDOCAINE 2% (20 MG/ML) 5 ML SYRINGE
INTRAMUSCULAR | Status: DC | PRN
  Administered 2016-12-26: 40 mg via INTRAVENOUS

## 2016-12-26 MED ORDER — PHENYLEPHRINE 40 MCG/ML (10ML) SYRINGE FOR IV PUSH (FOR BLOOD PRESSURE SUPPORT)
PREFILLED_SYRINGE | INTRAVENOUS | Status: DC | PRN
  Administered 2016-12-26: 80 ug via INTRAVENOUS

## 2016-12-26 MED ORDER — PROPOFOL 10 MG/ML IV BOLUS
INTRAVENOUS | Status: AC
  Filled 2016-12-26: qty 40

## 2016-12-26 NOTE — Transfer of Care (Signed)
Immediate Anesthesia Transfer of Care Note  Patient: Stacey [name]  Procedure(s) Performed: COLONOSCOPY (N/A )  Patient Location: PACU and Endoscopy Unit  Anesthesia Type:MAC  Level of Consciousness: awake and alert   Airway & Oxygen Therapy: Patient Spontanous Breathing and Patient connected to face mask oxygen  Post-op Assessment: Report given to RN and Post -op Vital signs reviewed and stable  Post vital signs: Reviewed and stable  Last Vitals:  Vitals:   12/26/16 1202 12/26/16 1339  BP: (!) 147/68 132/72  Pulse: 60 63  Resp: 12 19  Temp: 36.8 C   SpO2: 94% 97%    Last Pain:  Vitals:   12/26/16 1339  TempSrc: Oral  PainSc:          Complications: No apparent anesthesia complications

## 2016-12-26 NOTE — Anesthesia Postprocedure Evaluation (Signed)
Anesthesia Post Note  Patient: Stacey [name]  Procedure(s) Performed: COLONOSCOPY (N/A )     Patient location during evaluation: PACU Anesthesia Type: MAC Level of consciousness: awake and alert Pain management: pain level controlled Vital Signs Assessment: post-procedure vital signs reviewed and stable Respiratory status: spontaneous breathing, nonlabored ventilation, respiratory function stable and patient connected to nasal cannula oxygen Cardiovascular status: stable and blood pressure returned to baseline Postop Assessment: no apparent nausea or vomiting Anesthetic complications: no    Last Vitals:  Vitals:   12/26/16 1350 12/26/16 1352  BP: (!) 144/56 (!) 144/56  Pulse: 65 60  Resp: (!) 21 16  Temp:    SpO2: 94% 94%    Last Pain:  Vitals:   12/26/16 1339  TempSrc: Oral  PainSc:                  Effie Berkshire

## 2016-12-26 NOTE — Care Management Important Message (Signed)
Important Message  Patient Details  Name: Stacey MorinChristine O Crawford-Fewell MRN: [id number] Date of Birth: 01/13/1951   Medicare Important Message Given:  Yes    Caren MacadamFuller, Davi Kroon 12/26/2016, 12:15 PMImportant Message  Patient Details  Name: Stacey MorinChristine O Crawford-Fewell MRN: [id number] Date of Birth: 05/02/1950   Medicare Important Message Given:  Yes    Caren MacadamFuller, Wyatt Galvan 12/26/2016, 12:15 PM

## 2016-12-26 NOTE — Op Note (Signed)
[facility] Patient Name: Stacey [name] Procedure Date: 12/26/2016 MRN: [id number] Attending MD: Stacey Fee , MD Date of Birth: 02-22-1950 CSN: [id number] Age: 66 Admit Type: Inpatient Procedure:                Colonoscopy Indications:              Hematochezia; slow persistent red rectal bleeding                            for several days. Providers:                Stacey Fee, MD, Omelia Blackwater RN, RN,                            Mercy Riding, Technician, Heron Nay, CRNA Referring MD:              Medicines:                Monitored Anesthesia Care Complications:            No immediate complications. Estimated blood loss:                            None. Estimated Blood Loss:     Estimated blood loss: none. Procedure:                Pre-Anesthesia Assessment:                           - Prior to the procedure, a History and Physical                            was performed, and patient medications and                            allergies were reviewed. The patient's tolerance of                            previous anesthesia was also reviewed. The risks                            and benefits of the procedure and the sedation                            options and risks were discussed with the patient.                            All questions were answered, and informed consent                            was obtained. Prior Anticoagulants: The patient has                            taken no previous anticoagulant or antiplatelet                            agents. ASA  Grade Assessment: II - A patient with                            mild systemic disease. After reviewing the risks                            and benefits, the patient was deemed in                            satisfactory condition to undergo the procedure.                           After obtaining informed consent, the colonoscope                            was passed  under direct vision. Throughout the                            procedure, the patient's blood pressure, pulse, and                            oxygen saturations were monitored continuously. The                            EC-[id number] ([id number]) scope was introduced through                            the anus and advanced to the the cecum, identified                            by appendiceal orifice and ileocecal valve. The                            EG-[id number] ([id number]) scope was introduced through the                            and advanced to the. The colonoscopy was performed                            without difficulty. The patient tolerated the                            procedure well. The quality of the bowel                            preparation was excellent. The ileocecal valve,                            appendiceal orifice, and rectum were photographed. Scope In: 12:46:26 PM Scope Out: 1:30:43 PM Scope Withdrawal Time: 0 hours 40 minutes 52 seconds  Total Procedure Duration: 0 hours 44 minutes 17 seconds  Findings:      Her overt bleeding stopped while she was prepping last night.      There were diverticulum thorughout  the colon.      There was no blood in the colon during insertion however during       withdrawal there was obvious fresh red blood and some fresh red blood       clots in the sigmoid colon, in region of numerous diverticulum. I       flushed the region several times during which the bleeding clearly       stopped. One of the diverticulum seemed to begin slowly oozing again. I       could not approximate a clip in good position with the colonoscope and       so switched to an adult gastroscope. This allowed for better positioning       and I placed three endoclips at the site, closing the diverticular       opening around one of the clips. There was no further bleeding at the       end of the procedure.      There were small internal hemorrhoids only, these were  clearly NOT the       source of her bleeding.      The exam was otherwise without abnormality on direct and retroflexion       views. Impression:               - Pan colonic diveritculosis.                           - She rebled during this procedure and so I was                            able to clearly localize the bleeding to                            diverticulosis in her sigmoid colon. I believe I                            found the culprit diverticulum and (effectively?)                            closed it with placement of three endoclips.                           - Small internal hemorrhoids, these are not the                            source of her recent bleeding. Moderate Sedation:      N/A- Per Anesthesia Care Recommendation:           - Return patient to hospital ward for observation.                            If no rebleeding she can go home tomorrow.                           - Advance diet as tolerated.                           - Continue present medications.  Procedure Code(s):        --- Professional ---                           [id number], Colonoscopy, flexible; with control of                            bleeding, any method Diagnosis Code(s):        --- Professional ---                           K92.1, Melena (includes Hematochezia)                           K57.30, Diverticulosis of large intestine without                            perforation or abscess without bleeding CPT copyright 2016 American Medical Association. All rights reserved. The codes documented in this report are preliminary and upon coder review may  be revised to meet current compliance requirements. Stacey Feeaniel P Ellarie Picking, MD 12/26/2016 1:44:23 PM This report has been signed electronically. Number of Addenda: 0

## 2016-12-26 NOTE — Anesthesia Procedure Notes (Signed)
Procedure Name: MAC Date/Time: 12/26/2016 12:36 PM Performed by: Cynda Familia, CRNA Pre-anesthesia Checklist: Patient identified, Emergency Drugs available, Suction available, Patient being monitored and Timeout performed Patient Re-evaluated:Patient Re-evaluated prior to induction Oxygen Delivery Method: Simple face mask Placement Confirmation: positive ETCO2 and breath sounds checked- equal and bilateral Dental Injury: Teeth and Oropharynx as per pre-operative assessment  Comments: Face mask for sedation

## 2016-12-26 NOTE — Interval H&P Note (Signed)
History and Physical Interval Note:  12/26/2016 12:16 PM  Stacey [name]  has presented today for surgery, with the diagnosis of rectal bleeding  The various methods of treatment have been discussed with the patient and family. After consideration of risks, benefits and other options for treatment, the patient has consented to  Procedure(s): COLONOSCOPY (N/A) as a surgical intervention .  The patient's history has been reviewed, patient examined, no change in status, stable for surgery.  I have reviewed the patient's chart and labs.  Questions were answered to the patient's satisfaction.     Rachael Feeaniel P Jacobs

## 2016-12-26 NOTE — Telephone Encounter (Signed)
A hospital follow up appt has been scheduled for the pt to see Dr. Burr Medico on 01/26/17 at 10:15am. Pt is currently in the hospital. Will mail a letter with the appt date and time.

## 2016-12-26 NOTE — Anesthesia Preprocedure Evaluation (Addendum)
Anesthesia Evaluation  Patient identified by MRN, date of birth, ID band Patient awake    Reviewed: Allergy & Precautions, NPO status , Patient's Chart, lab work & pertinent test results, reviewed documented beta blocker date and time   Airway Mallampati: III  TM Distance: >3 FB Neck ROM: Full    Dental  (+) Teeth Intact, Dental Advisory Given, Chipped   Pulmonary asthma , COPD,  COPD inhaler, former smoker,    breath sounds clear to auscultation       Cardiovascular hypertension, Pt. on medications and Pt. on home beta blockers + CAD, + Peripheral Vascular Disease and +CHF  + dysrhythmias + pacemaker  Rhythm:Regular Rate:Normal + Systolic murmurs    Neuro/Psych PSYCHIATRIC DISORDERS Anxiety Depression  Neuromuscular disease    GI/Hepatic   Endo/Other  diabetes, Type 2, Oral Hypoglycemic Agents  Renal/GU Renal disease     Musculoskeletal   Abdominal   Peds  Hematology   Anesthesia Other Findings   Reproductive/Obstetrics                           Lab Results  Component Value Date   WBC 6.7 12/26/2016   HGB 11.5 (L) 12/26/2016   HCT 34.9 (L) 12/26/2016   MCV 77.4 (L) 12/26/2016   PLT 204 12/26/2016   EKG: normal sinus rhythm, LBBB (evaluated by Cardiology).  Echo: - Left ventricle: Wall thickness was increased in a pattern of severe LVH. There was moderate asymmetric hypertrophy. Systolic function was normal. The estimated ejection fraction was in the range of 55% to 60%. There was dynamic obstruction. Wall motion was normal; there were no regional wall motion abnormalities. Doppler parameters are consistent with abnormal left ventricular relaxation (grade 1 diastolic dysfunction). - Mitral valve: There was mild systolic anterior motion. There was moderate regurgitation. - Left atrium: The atrium was mildly dilated. - Right ventricle: The cavity size was mildly  dilated. - Right atrium: The atrium was mildly dilated.  Anesthesia Physical Anesthesia Plan  ASA: III  Anesthesia Plan: MAC   Post-op Pain Management:    Induction: Intravenous  PONV Risk Score and Plan: 3 and Propofol infusion  Airway Management Planned: Natural Airway and Nasal Cannula  Additional Equipment:   Intra-op Plan:   Post-operative Plan:   Informed Consent: I have reviewed the patients History and Physical, chart, labs and discussed the procedure including the risks, benefits and alternatives for the proposed anesthesia with the patient or authorized representative who has indicated his/her understanding and acceptance.     Plan Discussed with:   Anesthesia Plan Comments: (Maintain normal to slow HR.)       Anesthesia Quick Evaluation

## 2016-12-26 NOTE — Progress Notes (Signed)
Pt completed all of MoviPrep by 0330.  Pt completed 16oz of clear liquids following the MoviPrep by 0400.  Pt has had multiple bowel movements.

## 2016-12-26 NOTE — Progress Notes (Addendum)
PROGRESS NOTE    Stacey [name]  WUJ:[id number] DOB: 07/27/1950 DOA: 12/21/2016 PCP: Corwin LevinsJohn, James W, MD   Brief Narrative: 66 y.o.femalewith medical history significant ofsickle cell; renal artery aneurysm; HTN; HLD; diverticulosis; depression; COPD; pacemaker; COPD; chronic diastolic CHF; and CAD presenting with BRBPR. Symptoms have now improved  Assessment & Plan:  # painless GI bleed likely diverticular versus internal hemorrhoids.  -Patient reported passing blood clots on admission with drop in hemoglobin to 10.5.  Patient received cryo for low fibrinogen level.  The repeat fibrinogen level today 160.  As per Dr. Mosetta PuttFeng, transfuse cryo if fibrinogen level is less than 100.  Since patient continued to have GI bleed with a drop in hemoglobin, I discussed with Dr. Russella DarStark from GI. Rectal bleeding likely not related to  hypofibrinogenemia -Patient has been followed  by GI and oncology. Normal iron studies and repeat CBC hg  10.5>11.5.  Patient has no nausea vomiting or abdominal pain.  Tolerating diet well. Bloody diarrhea seems to have improved    #Hypertension: Continue current medication.  Patient is on amlodipine, Avapro, metoprolol.  Monitor blood pressure.  #Type 2 diabetes: Sliding scale.  Monitor blood sugar level.  #Chronic diastolic congestive heart failure: Compensated.  Patient has a pacemaker for complete heart block.  DVT prophylaxis: SCD Code Status: Full code Family Communication: No family at bedside Disposition Plan:  Anticipate discharge tomorrow    Consultants:   GI  Hematology  Procedures: None Antimicrobials: None  Subjective: Patient denies any further episodes of bloody diarrhea, tells me that her colonoscopies at 12:30 today. Denies any abdominal pain nausea vomiting. Denies any chest pain shortness of breath or dizziness.     Objective: Vitals:   12/25/16 0438 12/25/16 1402 12/25/16 2221 12/26/16 0408  BP: (!) 137/52 (!) 155/75 (!)  144/63 116/63  Pulse: 61 63 60 65  Resp: 16 16 16 14   Temp: 98 F (36.7 C) 98.3 F (36.8 C) 97.6 F (36.4 C) 97.8 F (36.6 C)  TempSrc: Oral Oral Oral Oral  SpO2: 95% 97% 96% 98%  Weight:      Height:        Intake/Output Summary (Last 24 hours) at 12/26/2016 1008 Last data filed at 12/26/2016 0600 Gross per 24 hour  Intake 920 ml  Output 5 ml  Net 915 ml   Filed Weights   12/21/16 1549  Weight: 77.6 kg (171 lb)    Examination:  General exam: Sitting in bed comfortable, not in distress Respiratory system: Clear bilateral, no wheezing Cardiovascular system: Regular rate rhythm, S1-S2 normal.  No pedal edema Gastrointestinal system: Abdomen soft, nontender, nondistended.  Bowel sounds positive. Central nervous system: Alert and oriented. No focal neurological deficits. Skin: No rashes, lesions or ulcers Psychiatry: Judgement and insight appear normal. Mood & affect appropriate.     Data Reviewed: I have personally reviewed following labs and imaging studies  CBC: Recent Labs  Lab 12/21/16 1825  12/22/16 1934 12/23/16 0208 12/24/16 0709 12/25/16 0351 12/26/16 0408  WBC 7.3   < > 6.8 6.6 5.6 5.2 6.7  NEUTROABS 4.3  --   --   --   --   --   --   HGB 11.9*   < > 11.7* 11.1* 11.2* 10.5* 11.5*  HCT 36.7   < > 36.0 34.3* 34.4* 32.6* 34.9*  MCV 77.4*   < > 77.4* 77.4* 76.8* 77.4* 77.4*  PLT 199   < > 214 195 185 184 204   < > =  values in this interval not displayed.   Basic Metabolic Panel: Recent Labs  Lab 12/21/16 1551 12/22/16 0140 12/26/16 0408  NA 139 143 141  K 4.2 3.6 3.6  CL 108 112* 111  CO2 24 24 21*  GLUCOSE 169* 165* 171*  BUN 14 13 11   CREATININE 0.78 0.63 0.62  CALCIUM 9.0 9.0 9.4   GFR: Estimated Creatinine Clearance: 73.3 mL/min (by C-G formula based on SCr of 0.62 mg/dL). Liver Function Tests: Recent Labs  Lab 12/21/16 1551 12/26/16 0408  AST 27 41  ALT 29 37  ALKPHOS 161* 145*  BILITOT 0.5 0.6  PROT 7.2 7.2  ALBUMIN 4.0 4.1    No results for input(s): LIPASE, AMYLASE in the last 168 hours. No results for input(s): AMMONIA in the last 168 hours. Coagulation Profile: Recent Labs  Lab 12/23/16 1426  INR 1.35   Cardiac Enzymes: No results for input(s): CKTOTAL, CKMB, CKMBINDEX, TROPONINI in the last 168 hours. BNP (last 3 results) No results for input(s): PROBNP in the last 8760 hours. HbA1C: No results for input(s): HGBA1C in the last 72 hours. CBG: Recent Labs  Lab 12/25/16 1650 12/25/16 2220 12/26/16 0008 12/26/16 0407 12/26/16 0807  GLUCAP 206* 144* 177* 170* 161*   Lipid Profile: No results for input(s): CHOL, HDL, LDLCALC, TRIG, CHOLHDL, LDLDIRECT in the last 72 hours. Thyroid Function Tests: No results for input(s): TSH, T4TOTAL, FREET4, T3FREE, THYROIDAB in the last 72 hours. Anemia Panel: Recent Labs    12/23/16 1426 12/26/16 0408  FERRITIN  --  137  TIBC  --  293  IRON  --  70  RETICCTPCT 3.3*  --    Sepsis Labs: No results for input(s): PROCALCITON, LATICACIDVEN in the last 168 hours.  No results found for this or any previous visit (from the past 240 hour(s)).       Radiology Studies: No results found.      Scheduled Meds: . amLODipine  10 mg Oral Daily  . atorvastatin  40 mg Oral q1800  . fenofibrate  160 mg Oral Daily  . hydrocortisone   Rectal BID  . insulin aspart  0-15 Units Subcutaneous TID WC  . insulin aspart  0-5 Units Subcutaneous QHS  . irbesartan  300 mg Oral QHS  . loratadine  10 mg Oral Daily  . metoprolol succinate  100 mg Oral Daily  . oxcarbazepine  600 mg Oral BID  . topiramate  100 mg Oral Daily   Continuous Infusions: . lactated ringers 75 mL/hr at 12/22/16 1314     LOS: 4 days    Richarda OverlieNayana Osinachi Navarrette, MD [facility] Pager [phone]  If 7PM-7AM, please contact night-coverage [email or web address] Password [email or web address] 12/26/2016, 10:08 AM

## 2016-12-27 ENCOUNTER — Telehealth: Payer: Self-pay | Admitting: *Deleted

## 2016-12-27 ENCOUNTER — Encounter (HOSPITAL_COMMUNITY): Payer: Self-pay | Admitting: Gastroenterology

## 2016-12-27 LAB — CBC
HCT: 34.1 % — ABNORMAL LOW (ref 36.0–46.0)
Hemoglobin: 10.9 g/dL — ABNORMAL LOW (ref 12.0–15.0)
MCH: 24.9 pg — ABNORMAL LOW (ref 26.0–34.0)
MCHC: 32 g/dL (ref 30.0–36.0)
MCV: 78 fL (ref 78.0–100.0)
Platelets: 222 10*3/uL (ref 150–400)
RBC: 4.37 MIL/uL (ref 3.87–5.11)
RDW: 15.1 % (ref 11.5–15.5)
WBC: 7.2 10*3/uL (ref 4.0–10.5)

## 2016-12-27 LAB — GLUCOSE, CAPILLARY: Glucose-Capillary: 162 mg/dL — ABNORMAL HIGH (ref 65–99)

## 2016-12-27 MED ORDER — HYDROCORTISONE 2.5 % RE CREA: TOPICAL_CREAM | Freq: Two times a day (BID) | RECTAL | 1 refills | Status: DC

## 2016-12-27 NOTE — Progress Notes (Signed)
D/d 'd home with her daughter. Both verbalized understating  of script to pick up and appts that  are scheduled

## 2016-12-27 NOTE — Telephone Encounter (Signed)
Pt was on TCM report tried calling pt top make hosp f/u appt no answer LMOM RTC...[name]/lmb

## 2016-12-27 NOTE — Discharge Summary (Addendum)
Physician Discharge Summary  Stacey [name] MRN: [id number] DOB/AGE: 01-Feb-1950 66 y.o.  PCP: Biagio Borg, MD   Admit date: 12/21/2016 Discharge date: 12/27/2016  Discharge Diagnoses:    Principal Problem:   Lower GI bleed Active Problems:   Essential hypertension   Chronic diastolic heart failure (HCC)   Diabetes ([place])   Pacemaker-Medtronic  MRI compatible   Hematochezia   Acute blood loss anemia   Fibrinogen decreased ([place])    Follow-up recommendations Follow-up with PCP in 3-5 days , including all  additional recommended appointments as below Follow-up CBC, CMP in 3-5 days       Allergies as of 12/27/2016      Reactions   Dairy Aid [lactase] Diarrhea, Other (See Comments)   Bloating and gastric distress   Metformin And Related Other (See Comments)   GI upset   Ace Inhibitors Other (See Comments)   REACTION: angioedema right eye   Aspirin Other (See Comments)   Bleeding    Codeine Other (See Comments)   Hallucinations, can take if with someone.   Soy Allergy Other (See Comments)   Bleeding & cramps      Medication List    TAKE these medications   acetaminophen 500 MG tablet Commonly known as:  TYLENOL Take 500 mg by mouth daily as needed.   albuterol 108 (90 Base) MCG/ACT inhaler Commonly known as:  PROVENTIL HFA;VENTOLIN HFA Inhale 2 puffs into the lungs every 6 (six) hours as needed for wheezing or shortness of breath.   amLODipine 10 MG tablet Commonly known as:  NORVASC TAKE 1 TABLET BY MOUTH EVERY DAY   atorvastatin 40 MG tablet Commonly known as:  LIPITOR TAKE 1 TABLET(40 MG) BY MOUTH DAILY   cetirizine 10 MG tablet Commonly known as:  ZYRTEC Take 10 mg by mouth daily.   fenofibrate 145 MG tablet Commonly known as:  TRICOR TAKE 1 TABLET BY MOUTH DAILY   glipiZIDE 10 MG 24 hr tablet Commonly known as:  GLUCOTROL XL Take 1 tablet (10 mg total) by mouth daily with breakfast.   glucose blood test strip 1 each by Other  route daily. Use to check blood sugar daily Dx e11.9   hydrocortisone 2.5 % rectal cream Commonly known as:  ANUSOL-HC Place rectally 2 (two) times daily.   irbesartan 300 MG tablet Commonly known as:  AVAPRO TAKE 1 TABLET(300 MG) BY MOUTH AT BEDTIME   meclizine 12.5 MG tablet Commonly known as:  ANTIVERT Take 1 tablet (12.5 mg total) by mouth 3 (three) times daily as needed for dizziness.   metoprolol succinate 100 MG 24 hr tablet Commonly known as:  TOPROL-XL TAKE 1 TABLET BY MOUTH EVERY DAY   ondansetron 4 MG tablet Commonly known as:  ZOFRAN Take 1 tablet (4 mg total) by mouth every 8 (eight) hours as needed for nausea or vomiting. As needed for nausea or vomiting.   ONE TOUCH ULTRA 2 w/Device Kit Use to check blood sugars daily Dx A25.0   ONETOUCH DELICA LANCETS 53Z Misc USE TO HELP CHECK BLOOD SUGAR DAILY   oxcarbazepine 600 MG tablet Commonly known as:  TRILEPTAL Take 600 mg by mouth 2 (two) times daily.   potassium chloride 10 MEQ tablet Commonly known as:  K-DUR Take 1 tablet (10 mEq total) by mouth 2 (two) times daily.   topiramate 100 MG tablet Commonly known as:  TOPAMAX Take 100 mg by mouth daily.        Discharge Condition:  Stable  Discharge Instructions Get Medicines reviewed and adjusted: Please take all your medications with you for your next visit with your Primary MD  Please request your Primary MD to go over all hospital tests and procedure/radiological results at the follow up, please ask your Primary MD to get all Hospital records sent to his/her office.  If you experience worsening of your admission symptoms, develop shortness of breath, life threatening emergency, suicidal or homicidal thoughts you must seek medical attention immediately by calling 911 or calling your MD immediately if symptoms less severe.  You must read complete instructions/literature along with all the possible adverse reactions/side effects for all the Medicines  you take and that have been prescribed to you. Take any new Medicines after you have completely understood and accpet all the possible adverse reactions/side effects.   Do not drive when taking Pain medications.   Do not take more than prescribed Pain, Sleep and Anxiety Medications  Special Instructions: If you have smoked or chewed Tobacco in the last 2 yrs please stop smoking, stop any regular Alcohol and or any Recreational drug use.  Wear Seat belts while driving.  Please note  You were cared for by a hospitalist during your hospital stay. Once you are discharged, your primary care physician will handle any further medical issues. Please note that NO REFILLS for any discharge medications will be authorized once you are discharged, as it is imperative that you return to your primary care physician (or establish a relationship with a primary care physician if you do not have one) for your aftercare needs so that they can reassess your need for medications and monitor your lab values.  Discharge Instructions    Diet - low sodium heart healthy   Complete by:  As directed    Increase activity slowly   Complete by:  As directed        Allergies  Allergen Reactions  . Dairy Aid [Lactase] Diarrhea and Other (See Comments)    Bloating and gastric distress  . Metformin And Related Other (See Comments)    GI upset  . Ace Inhibitors Other (See Comments)    REACTION: angioedema right eye  . Aspirin Other (See Comments)    Bleeding   . Codeine Other (See Comments)    Hallucinations, can take if with someone.  . Soy Allergy Other (See Comments)    Bleeding & cramps      Disposition: 01-Home or Self Care   Consults: Gastroenterology Colonoscopy 12/3 Pan colonic diveritculosis. - She rebled during this procedure and so I was able to clearly localize the bleeding to diverticulosis in her sigmoid colon. I believe I found the culprit diverticulum and (effectively?) closed it with  placement of three endoclips. - Small internal hemorrhoids, these are not the source of her recent bleeding.    Significant Diagnostic Studies:  Dg Abd Acute W/chest  Result Date: 12/21/2016 CLINICAL DATA:  Pt states she is having a diverticulitis flare up. Pt c/o of nausea, abdominal distension, and abdominal pain. No hx of surgeries. Denies constipation and diarrhea. EXAM: DG ABDOMEN ACUTE W/ 1V CHEST COMPARISON:  02/27/2014 FINDINGS: Patient's left-sided transvenous pacer with leads to the right atrium and right ventricle. Status post median sternotomy. The heart is normal in size. There are no focal consolidations or pleural effusions. There is stable focal pleural thickening at the left lung apex since 2014, consistent with benign cause. Bowel gas pattern is nonobstructive. There is a large amount of stool throughout the colon. No  abnormal calcifications or evidence for organomegaly. IMPRESSION: 1.  No evidence for acute cardiopulmonary abnormality. 2. Large stool burden. Electronically Signed   By: Nolon Nations M.D.   On: 12/21/2016 19:22        Filed Weights   12/21/16 1549 12/26/16 1202  Weight: 77.6 kg (171 lb) 77.6 kg (171 lb)     Microbiology: No results found for this or any previous visit (from the past 240 hour(s)).     Blood Culture No results found for: SDES, SPECREQUEST, CULT, REPTSTATUS    Labs: Results for orders placed or performed during the hospital encounter of 12/21/16 (from the past 48 hour(s))  Glucose, capillary     Status: Abnormal   Collection Time: 12/25/16 11:51 AM  Result Value Ref Range   Glucose-Capillary 178 (H) 65 - 99 mg/dL   Comment 1 Notify RN    Comment 2 Document in Chart   Glucose, capillary     Status: Abnormal   Collection Time: 12/25/16  4:50 PM  Result Value Ref Range   Glucose-Capillary 206 (H) 65 - 99 mg/dL  Glucose, capillary     Status: Abnormal   Collection Time: 12/25/16 10:20 PM  Result Value Ref Range    Glucose-Capillary 144 (H) 65 - 99 mg/dL  Glucose, capillary     Status: Abnormal   Collection Time: 12/26/16 12:08 AM  Result Value Ref Range   Glucose-Capillary 177 (H) 65 - 99 mg/dL  Glucose, capillary     Status: Abnormal   Collection Time: 12/26/16  4:07 AM  Result Value Ref Range   Glucose-Capillary 170 (H) 65 - 99 mg/dL  Ferritin     Status: None   Collection Time: 12/26/16  4:08 AM  Result Value Ref Range   Ferritin 137 11 - 307 ng/mL    Comment: Performed at [facility], [place] [address] [place], [place] [address]  Iron and TIBC     Status: None   Collection Time: 12/26/16  4:08 AM  Result Value Ref Range   Iron 70 28 - 170 ug/dL   TIBC 293 250 - 450 ug/dL   Saturation Ratios 24 10.4 - 31.8 %   UIBC 223 ug/dL    Comment: Performed at [facility], [place] [address], [place],  [address]  CBC     Status: Abnormal   Collection Time: 12/26/16  4:08 AM  Result Value Ref Range   WBC 6.7 4.0 - 10.5 K/uL   RBC 4.51 3.87 - 5.11 MIL/uL   Hemoglobin 11.5 (L) 12.0 - 15.0 g/dL   HCT 34.9 (L) 36.0 - 46.0 %   MCV 77.4 (L) 78.0 - 100.0 fL   MCH 25.5 (L) 26.0 - 34.0 pg   MCHC 33.0 30.0 - 36.0 g/dL   RDW 15.0 11.5 - 15.5 %   Platelets 204 150 - 400 K/uL  Comprehensive metabolic panel     Status: Abnormal   Collection Time: 12/26/16  4:08 AM  Result Value Ref Range   Sodium 141 135 - 145 mmol/L   Potassium 3.6 3.5 - 5.1 mmol/L   Chloride 111 101 - 111 mmol/L   CO2 21 (L) 22 - 32 mmol/L   Glucose, Bld 171 (H) 65 - 99 mg/dL   BUN 11 6 - 20 mg/dL   Creatinine, Ser 0.62 0.44 - 1.00 mg/dL   Calcium 9.4 8.9 - 10.3 mg/dL   Total Protein 7.2 6.5 - 8.1 g/dL   Albumin 4.1 3.5 - 5.0  g/dL   AST 41 15 - 41 U/L   ALT 37 14 - 54 U/L   Alkaline Phosphatase 145 (H) 38 - 126 U/L   Total Bilirubin 0.6 0.3 - 1.2 mg/dL   GFR calc non Af Amer >60 >60 mL/min   GFR calc Af Amer >60 >60 mL/min    Comment: (NOTE) The eGFR has been calculated using the CKD EPI equation. This  calculation has not been validated in all clinical situations. eGFR's persistently <60 mL/min signify possible Chronic Kidney Disease.    Anion gap 9 5 - 15  Glucose, capillary     Status: Abnormal   Collection Time: 12/26/16  8:07 AM  Result Value Ref Range   Glucose-Capillary 161 (H) 65 - 99 mg/dL   Comment 1 Notify RN    Comment 2 Document in Chart   Glucose, capillary     Status: Abnormal   Collection Time: 12/26/16 12:27 PM  Result Value Ref Range   Glucose-Capillary 114 (H) 65 - 99 mg/dL  Glucose, capillary     Status: Abnormal   Collection Time: 12/26/16  2:06 PM  Result Value Ref Range   Glucose-Capillary 113 (H) 65 - 99 mg/dL   Comment 1 Notify RN    Comment 2 Document in Chart   Glucose, capillary     Status: Abnormal   Collection Time: 12/26/16  5:11 PM  Result Value Ref Range   Glucose-Capillary 241 (H) 65 - 99 mg/dL   Comment 1 Notify RN    Comment 2 Document in Chart   Glucose, capillary     Status: Abnormal   Collection Time: 12/26/16  9:32 PM  Result Value Ref Range   Glucose-Capillary 211 (H) 65 - 99 mg/dL  CBC     Status: Abnormal   Collection Time: 12/27/16  4:01 AM  Result Value Ref Range   WBC 7.2 4.0 - 10.5 K/uL   RBC 4.37 3.87 - 5.11 MIL/uL   Hemoglobin 10.9 (L) 12.0 - 15.0 g/dL   HCT 34.1 (L) 36.0 - 46.0 %   MCV 78.0 78.0 - 100.0 fL   MCH 24.9 (L) 26.0 - 34.0 pg   MCHC 32.0 30.0 - 36.0 g/dL   RDW 15.1 11.5 - 15.5 %   Platelets 222 150 - 400 K/uL  Glucose, capillary     Status: Abnormal   Collection Time: 12/27/16  8:02 AM  Result Value Ref Range   Glucose-Capillary 162 (H) 65 - 99 mg/dL   Comment 1 Notify RN    Comment 2 Document in Chart      Lipid Panel     Component Value Date/Time   CHOL 166 02/26/2016 1055   TRIG 347.0 (H) 02/26/2016 1055   HDL 45.00 02/26/2016 1055   CHOLHDL 4 02/26/2016 1055   VLDL 69.4 (H) 02/26/2016 1055   LDLCALC 79 09/11/2012 0615   LDLDIRECT 65.0 02/26/2016 1055     Lab Results  Component Value Date    HGBA1C 8.9 (H) 12/22/2016   HGBA1C 8.7 (H) 02/26/2016   HGBA1C 9.9 (H) 09/04/2015     Lab Results  Component Value Date   MICROALBUR 87.5 (H) 09/04/2015   LDLCALC 79 09/11/2012   CREATININE 0.62 12/26/2016     HPI   66 y.o. female with medical history significant of sickle cell; renal artery aneurysm; HTN; HLD; diverticulosis; depression; COPD; pacemaker; COPD; chronic diastolic CHF; and CAD presenting with BRBPR.  She reports that she has "diverticulitis".  She started feeling bad on  Friday, with bloody stools.  She had a "slight tinge" of pain "in the colon", the LLQ.  She has had bloody stools with each BM.  Even with just urinating, she would pass gas and small amounts of blood.  Bright red blood.  Last BM was at 1:30 today.  No fever.  Slight nausea, no emesis.  She felt a little woozy at times.  No SOB compared to her normal.     ED Course: BRBPR, bloating.  Bright red blood on rectal with small hemorrhoid not thought to be the problem.  Hgb slowly trending down.  Gastroenterology was consulted    HOSPITAL COURSE:    # painless GI bleed likely diverticular versus internal hemorrhoids.  -Patient reported passing blood clots on admission with drop in hemoglobin to 10.5.  Patient received cryo for low fibrinogen level. GI and hematology/oncology were consulted  The repeat fibrinogen level was 160.  As per Dr. Burr Medico, transfuse cryo if fibrinogen level is less than 100.  Since patient continued to have GI bleed with a drop in hemoglobin despite transfusion with cryo, Dr. Fuller Plan from GI recommended colonoscopy . Rectal bleeding likely not related to  hypofibrinogenemia as per hematology  Normal iron studies and repeat CBC hg  10.5>11.5>10.9.  Patient has no nausea vomiting or abdominal pain.  Tolerating diet well. Bloody diarrhea seems to have improved. Results of colonoscopy should as above, bleeding likely secondary to pan diverticulitis treated with endoclips. Started on [facility] for  internal hemorrhoids    #Hypertension: Continue current medication.  Patient is on amlodipine, Avapro, metoprolol.  Monitor blood pressure.  #Type 2 diabetes:  Resume home regimen.  #Chronic diastolic congestive heart failure: Compensated.  Patient has a pacemaker for complete heart block.     Discharge Exam: *  Blood pressure 136/61, pulse 70, temperature 97.7 F (36.5 C), temperature source Oral, resp. rate 12, height '5\' 9"'$  (1.753 m), weight 77.6 kg (171 lb), SpO2 100 %. General exam: Sitting in bed comfortable, not in distress Respiratory system: Clear bilateral, no wheezing Cardiovascular system: Regular rate rhythm, S1-S2 normal.  No pedal edema Gastrointestinal system: Abdomen soft, nontender, nondistended.  Bowel sounds positive. Central nervous system: Alert and oriented. No focal neurological deficits. Skin: No rashes, lesions or ulcers Psychiatry: Judgement and insight appear normal. Mood & affect appropriate.        Follow-up Information    Biagio Borg, MD. Call.   Specialties:  Internal Medicine, Radiology Why:  Hospital follow-up in 3-5 days Contact information: [place] [address] [phone]           Signed: Reyne Dumas 12/27/2016, 8:40 AM        Time spent >1 hour

## 2016-12-27 NOTE — Progress Notes (Signed)
Progress Note   Subjective  Chief Complaint: Hematochezia  Pt doing well this morning, sitting up in bed having just finished her Heart healthy breakfast. She tells me she has not yet had a BM this morning, but "I feel one coming". Nurse is in the room and tells me discharge papers are ready and there were no acute events overnight.    Objective   Vital signs in last 24 hours: Temp:  [97.7 F (36.5 C)-98.3 F (36.8 C)] 97.7 F (36.5 C) (12/04 0522) Pulse Rate:  [60-76] 70 (12/04 0522) Resp:  [12-21] 12 (12/04 0522) BP: (124-147)/(55-72) 136/61 (12/04 0522) SpO2:  [94 %-100 %] 100 % (12/04 0522) Weight:  [171 lb (77.6 kg)] 171 lb (77.6 kg) (12/03 1202) Last BM Date: 12/26/16 General:    white female in NAD Heart:  Regular rate and rhythm; no murmurs Lungs: Respirations even and unlabored, lungs CTA bilaterally Abdomen:  Soft, nontender and nondistended. Normal bowel sounds. Extremities:  Without edema. Neurologic:  Alert and oriented,  grossly normal neurologically. Psych:  Cooperative. Normal mood and affect.  Intake/Output from previous day: 12/03 0701 - 12/04 0700 In: 1080 [P.O.:480; I.V.:600] Out: 2 [Urine:2]  Lab Results: Recent Labs    12/25/16 0351 12/26/16 0408 12/27/16 0401  WBC 5.2 6.7 7.2  HGB 10.5* 11.5* 10.9*  HCT 32.6* 34.9* 34.1*  PLT 184 204 222   BMET Recent Labs    12/26/16 0408  NA 141  K 3.6  CL 111  CO2 21*  GLUCOSE 171*  BUN 11  CREATININE 0.62  CALCIUM 9.4   LFT Recent Labs    12/26/16 0408  PROT 7.2  ALBUMIN 4.1  AST 41  ALT 37  ALKPHOS 145*  BILITOT 0.6   Procedures: Colonoscopy 12/26/16-Dr. Ardis Hughs: Findings:      Her overt bleeding stopped while she was prepping last night.      There were diverticulum thorughout the colon.      There was no blood in the colon during insertion however during       withdrawal there was obvious fresh red blood and some fresh red blood       clots in the sigmoid colon, in region of  numerous diverticulum. I       flushed the region several times during which the bleeding clearly       stopped. One of the diverticulum seemed to begin slowly oozing again. I       could not approximate a clip in good position with the colonoscope and       so switched to an adult gastroscope. This allowed for better positioning       and I placed three endoclips at the site, closing the diverticular       opening around one of the clips. There was no further bleeding at the       end of the procedure.      There were small internal hemorrhoids only, these were clearly NOT the       source of her bleeding.      The exam was otherwise without abnormality on direct and retroflexion       views. Impression:               - Pan colonic diveritculosis.                           - She rebled during this procedure and  so I was                            able to clearly localize the bleeding to                            diverticulosis in her sigmoid colon. I believe I                            found the culprit diverticulum and (effectively?)                            closed it with placement of three endoclips.                           - Small internal hemorrhoids, these are not the                            source of her recent bleeding. Moderate Sedation:      N/A- Per Anesthesia Care Recommendation:           - Return patient to hospital ward for observation.                            If no rebleeding she can go home tomorrow.                           - Advance diet as tolerated.                           - Continue present medications.     Assessment / Plan:   Assessment: 1. Diverticular bleeding: as seen at time of colonoscopy 12/26/16 in the sigmoid colon, clipped x3, hgb stable overnight  Plan: 1. Reviewed colonoscopy and findings with patient 2. Continue regular diet 3. Patient may be discharged today if no further signs of bleeding  Discharge Planning Diet:  Regular Discharge Medications: No new Follow up: With PCP as directed   Thank you for your kind consultation, we will sign off.   LOS: 5 days   Levin Erp  12/27/2016, 9:05 AM  Pager # [phone]  ________________________________________________________________________  Velora Heckler GI MD note:  I reviewed the data and agree with the assessment and plan described above. She was discharged from [facility] before I was able to round on her this morning.     Owens Loffler, MD [facility] Gastroenterology Pager [phone]

## 2016-12-27 NOTE — Progress Notes (Signed)
Nutrition Education Note  RD consulted for nutrition education regarding diverticulosis diet.  RD provided "High Fiber Nutrition Therapy" handout from [employer]. Reviewed patient's dietary recall. Provided examples of low and high fiber foods. Discouraged intake of high fiber foods, high fat foods, spicy foods, processed foods, caffeine and red meats when having a flare. Encouraged pt to cook foods until they are soft and chew foods well to help aid in digestion. Also recommend frequent small meals. Encouraged use of a multi-vitamin and protein supplements while having a flare.    RD encourage intake of high fiber foods when not having a flare. Recommended a gradual increase in fiber intake towards goal of 25-35g of fiber daily. Encouraged consistent daily fiber intake to help prevent flares.  Expect good compliance with support from family. Pt's daughter present during education.  Body mass index is 25.25 kg/m. Pt meets criteria for normal based on current BMI.  Current diet order is Heart Healthy, patient is consuming approximately 100% of meals at this time. Labs and medications reviewed. No further nutrition interventions warranted at this time. If additional nutrition issues arise, please re-consult RD.  Tilda FrancoLindsey Arkie Tagliaferro, MS, RD, LDN [facility] Clinical Dietitian Pager: [phone] After Hours Pager: [phone]

## 2016-12-28 NOTE — Telephone Encounter (Signed)
Called pt again still no answer LMOM RTC to make appt for hosp f/u///lmb

## 2016-12-29 NOTE — Telephone Encounter (Signed)
Called pt again still no answer. Per chart appt has been made w/her GI MD for 01/09/17. Closing encounter...Johny Chess

## 2017-01-09 ENCOUNTER — Encounter: Payer: Self-pay | Admitting: Nurse Practitioner

## 2017-01-09 ENCOUNTER — Ambulatory Visit: Payer: PPO | Admitting: Nurse Practitioner

## 2017-01-09 VITALS — BP 128/72 | HR 80 | Ht 69.0 in | Wt 171.0 lb

## 2017-01-09 DIAGNOSIS — Z8719 Personal history of other diseases of the digestive system: Secondary | ICD-10-CM

## 2017-01-09 DIAGNOSIS — Z800 Family history of malignant neoplasm of digestive organs: Secondary | ICD-10-CM

## 2017-01-09 NOTE — Progress Notes (Addendum)
Chief Complaint:  Hospital follow-up for GI bleed   HPI: Patient is a 66 year old female known to Dr. Leone PayorGessner recently hospitalized with lower GI bleed. Bleed was low-volume, initially felt to be hemorrhoidal related. Patient continued to have low-volume bleeding and underwent inpatient colonoscopy which showed pandiverticulosis. There was no obvious bleeding at the time of colonoscopy until scope was being withdrawn at which time fresh blood and clots were seen in the sigmoid colon in area of  numerous diverticulum. One of the diverticulum began oozing, 3 endoclips were placed at the site.    During recent admission patient's daughter reminded her about problems with prolonged bleeding at the time of cardiac surgery. She also offered there was a family history of some sort of clotting disorder. Hematology was consulted, fibrinogen level was low. 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 Follows up with Hematology in January.   Patient has not had any further rectal bleeding. She used the Anusol cream and her hemorrhoids have improved. She is going to get a medic alert bracelet for her bleeding disorder   Past Medical History:  Diagnosis Date  . ANGIOEDEMA 03/07/2008   a. with ACE-I  . ANXIETY 09/16/2006  . ASTHMA 08/01/2006  . CAD (coronary artery disease)    a. 01/2010 : Minimal plaque at cardiac catheterization - CFX 20%, EF 70%;  b. 08/2012 Cath: Nl Cors, EF 65%.  . Chronic diastolic CHF (congestive heart failure) (HCC) 04/06/2010   a. In setting of HOCM.  Marland Kitchen. Complete heart block (HCC) 02/26/2014   a. s/p MDT dual chamber pacemaker 02/2014  . COPD (chronic obstructive pulmonary disease) (HCC)   . DEPRESSION 09/16/2006  . DIVERTICULOSIS, COLON, WITH HEMORRHAGE 04/06/2010  . DM (diabetes mellitus) in pregnancy, delivered w/postpartum condition 08/30/2010  . HYPERLIPIDEMIA 08/01/2006  . HYPERTENSION 08/01/2006  . 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 s/p septal  myomectomy 4/12 at [facility] with Dr. Silvestre MesiGlower;  b. echo 5/12: EF 60-65%, LVOT peak 18 mmHg; grade 1 diast dysfxn, mild SAM (improved since myomectomy;  c. 03/2012 Echo: EF 60-65%, mod-sev basal septal asymm hypertrophy, basal septal HK, LVOT grad 68mmHg, Gr 1 DD, , SAM, Mild MR, nl RV, PASP 35mmHg; 08/2012 Echo: technically difficult, doubt LVOT obstruction, EF 60%, Gr 1 DD, mod-sev dil LA.  . Impaired glucose tolerance 06/25/2010  . LBBB (left bundle branch block) 06/17/2010  . OBESITY 09/16/2006  . 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 SICKLE CELL ANEMIA 08/01/2006  . Type II or unspecified type diabetes mellitus without mention of complication, uncontrolled 08/30/2010  . VENTRAL HERNIA 12/07/2006    Patient's surgical history, family medical history, social history, medications and allergies were all reviewed in Epic    Physical Exam: BP 128/72   Pulse 80   Ht 5\' 9"  (1.753 m)   Wt 171 lb (77.6 kg)   BMI 25.25 kg/m   GENERAL:  Well developed black female in NAD PSYCH: :Pleasant, cooperative, normal affect EENT:  conjunctiva pink, mucous membranes moist, neck supple without masses CARDIAC:  RRR, + murmur heard PULM: Normal respiratory effort, lungs CTA bilaterally, no wheezing ABDOMEN:  Nondistended, soft, nontender. No obvious masses, no hepatomegaly,  normal bowel sounds SKIN:  turgor, no lesions seen Musculoskeletal:  Normal muscle tone, normal strength NEURO: Alert and oriented x 3, no focal neurologic deficits    ASSESSMENT and PLAN:  1. Pleasant 66 year old -year-old female recently hospitalized with low volume but prolonged rectal bleeding in setting  of recently diagnosed congenital hypofibrinogenemia. She underwent inpatient colonoscopy with Endo Clip placement to oozing left-sided diverticulum. Pan diverticulosis was found . No further bleeding.  -Continue high-fiber diet -Again I encouraged patient to get a medic alert bracelet for her history of hypofibrinogenemia -Call ASAP or go to ED  if recurrent bleeding in the future  2. Family history of colon cancer. She just had a complete colonoscopy in the hospital earlier this month. The bowel prep was excellent, no polyps or cancers were seen  Willette ClusterPaula Guenther , NP 01/09/2017, 10:44 AM   Agree with Ms. Guenther's assessment and plan. Iva Booparl E. Gessner, MD, Clementeen GrahamFACG

## 2017-01-09 NOTE — Patient Instructions (Signed)
If you are age 65 or older, your body mass index should be between 23-30. Your Body mass index is 25.25 kg/m. If this is out of the aforementioned range listed, please consider follow up with your Primary Care Provider.  If you are age 65 or younger, your body mass index should be between 19-25. Your Body mass index is 25.25 kg/m. If this is out of the aformentioned range listed, please consider follow up with your Primary Care Provider.   Follow up as needed.  Thank you for choosing me and [facility].   Willette ClusterPaula Guenther, NP

## 2017-01-10 ENCOUNTER — Encounter: Payer: Self-pay | Admitting: Internal Medicine

## 2017-01-10 ENCOUNTER — Ambulatory Visit: Payer: PPO | Admitting: Internal Medicine

## 2017-01-10 ENCOUNTER — Other Ambulatory Visit: Payer: Self-pay | Admitting: Internal Medicine

## 2017-01-10 ENCOUNTER — Other Ambulatory Visit (INDEPENDENT_AMBULATORY_CARE_PROVIDER_SITE_OTHER): Payer: PPO

## 2017-01-10 VITALS — BP 124/86 | HR 81 | Temp 98.4°F | Ht 69.0 in | Wt 171.0 lb

## 2017-01-10 DIAGNOSIS — E119 Type 2 diabetes mellitus without complications: Secondary | ICD-10-CM

## 2017-01-10 DIAGNOSIS — Z23 Encounter for immunization: Secondary | ICD-10-CM

## 2017-01-10 LAB — HEPATIC FUNCTION PANEL
ALT: 34 U/L (ref 0–35)
AST: 39 U/L — ABNORMAL HIGH (ref 0–37)
Albumin: 4.2 g/dL (ref 3.5–5.2)
Alkaline Phosphatase: 150 U/L — ABNORMAL HIGH (ref 39–117)
Bilirubin, Direct: 0.1 mg/dL (ref 0.0–0.3)
Total Bilirubin: 0.3 mg/dL (ref 0.2–1.2)
Total Protein: 7.2 g/dL (ref 6.0–8.3)

## 2017-01-10 LAB — BASIC METABOLIC PANEL
BUN: 19 mg/dL (ref 6–23)
CO2: 28 mEq/L (ref 19–32)
Calcium: 9.2 mg/dL (ref 8.4–10.5)
Chloride: 105 mEq/L (ref 96–112)
Creatinine, Ser: 0.81 mg/dL (ref 0.40–1.20)
GFR: 91.13 mL/min (ref >60.00)
Glucose, Bld: 277 mg/dL — ABNORMAL HIGH (ref 70–99)
Potassium: 3.8 mEq/L (ref 3.5–5.1)
Sodium: 140 mEq/L (ref 135–145)

## 2017-01-10 LAB — CBC WITH DIFFERENTIAL/PLATELET
Basophils Absolute: 0 10*3/uL (ref 0.0–0.1)
Basophils Relative: 0.6 % (ref 0.0–3.0)
Eosinophils Absolute: 0.1 10*3/uL (ref 0.0–0.7)
Eosinophils Relative: 1.8 % (ref 0.0–5.0)
HCT: 35.6 % — ABNORMAL LOW (ref 36.0–46.0)
Hemoglobin: 11.4 g/dL — ABNORMAL LOW (ref 12.0–15.0)
Lymphocytes Relative: 27.7 % (ref 12.0–46.0)
Lymphs Abs: 1.7 10*3/uL (ref 0.7–4.0)
MCHC: 32.2 g/dL (ref 30.0–36.0)
MCV: 79.1 fl (ref 78.0–100.0)
Monocytes Absolute: 0.3 10*3/uL (ref 0.1–1.0)
Monocytes Relative: 4.8 % (ref 3.0–12.0)
Neutro Abs: 4.1 10*3/uL (ref 1.4–7.7)
Neutrophils Relative %: 65.1 % (ref 43.0–77.0)
Platelets: 256 10*3/uL (ref 150.0–400.0)
RBC: 4.5 Mil/uL (ref 3.87–5.11)
RDW: 17 % — ABNORMAL HIGH (ref 11.5–15.5)
WBC: 6.3 10*3/uL (ref 4.0–10.5)

## 2017-01-10 LAB — TSH: TSH: 0.97 u[IU]/mL (ref 0.35–4.50)

## 2017-01-10 LAB — HEMOGLOBIN A1C: Hgb A1c MFr Bld: 7.7 % — ABNORMAL HIGH (ref 4.6–6.5)

## 2017-01-10 LAB — URINALYSIS, ROUTINE W REFLEX MICROSCOPIC
Bilirubin Urine: NEGATIVE
Hgb urine dipstick: NEGATIVE
Leukocytes, UA: NEGATIVE
Nitrite: NEGATIVE
Specific Gravity, Urine: 1.025 (ref 1.000–1.030)
Total Protein, Urine: 100 — AB
Urine Glucose: 100 — AB
Urobilinogen, UA: 0.2 (ref 0.0–1.0)
WBC, UA: NONE SEEN (ref 0–?)
pH: 5 (ref 5.0–8.0)

## 2017-01-10 LAB — MICROALBUMIN / CREATININE URINE RATIO
Creatinine,U: 112.8 mg/dL
Microalb Creat Ratio: 33 mg/g — ABNORMAL HIGH (ref 0.0–30.0)
Microalb, Ur: 37.2 mg/dL — ABNORMAL HIGH (ref 0.0–1.9)

## 2017-01-10 LAB — LDL CHOLESTEROL, DIRECT: Direct LDL: 74 mg/dL

## 2017-01-10 LAB — LIPID PANEL
Cholesterol: 162 mg/dL (ref 0–200)
HDL: 42.9 mg/dL (ref >39.00)
Total CHOL/HDL Ratio: 4
Triglycerides: 538 mg/dL — ABNORMAL HIGH (ref 0.0–149.0)

## 2017-01-10 MED ORDER — FENOFIBRATE 145 MG PO TABS: 145.0000 mg | ORAL_TABLET | Freq: Every day | ORAL | 3 refills | Status: DC

## 2017-01-10 NOTE — Patient Instructions (Signed)
You had Prevnar pneumonia shot today  Please continue all other medications as before, and refills have been done if requested.  Please have the pharmacy call with any other refills you may need.  Please continue your efforts at being more active, low cholesterol diet, and weight control.  You are otherwise up to date with prevention measures today.  Please keep your appointments with your specialists as you may have planned  Please go to the LAB in the Basement (turn left off the elevator) for the tests to be done today  You will be contacted by phone if any changes need to be made immediately.  Otherwise, you will receive a letter about your results with an explanation, but please check with MyChart first.  Please remember to sign up for MyChart if you have not done so, as this will be important to you in the future with finding out test results, communicating by private email, and scheduling acute appointments online when needed.  Please return in 6 months, or sooner if needed, with Lab testing done 3-5 days before

## 2017-01-10 NOTE — Progress Notes (Signed)
Subjective:    Patient ID: Stacey [name], female    DOB: [birth year], 66 y.o.   MRN: [id number]  HPI  Here for wellness and f/u;  Overall doing ok;  Pt denies Chest pain, worsening SOB, DOE, wheezing, orthopnea, PND, worsening LE edema, palpitations, dizziness or syncope.  Pt denies neurological change such as new headache, facial or extremity weakness.  Pt denies polydipsia, polyuria, or low sugar symptoms. Pt states overall good compliance with treatment and medications, good tolerability, and has been trying to follow appropriate diet.  Pt denies worsening depressive symptoms, suicidal ideation or panic. No fever, night sweats, wt loss, loss of appetite, or other constitutional symptoms.  Pt states good ability with ADL's, has low fall risk, home safety reviewed and adequate, no other significant changes in hearing or vision, and only occasionally active with exercise.  Plans to see optho soon on her own.Takign care of husband with cancer, declines dxa for now.   Did have recent low fibrinogen with lower GI bleeding, to see heme jan 3.  No further BRBPR Denies worsening reflux, abd pain, dysphagia, n/v, bowel change or blood. Past Medical History:  Diagnosis Date  . ANGIOEDEMA 03/07/2008   a. with ACE-I  . ANXIETY 09/16/2006  . ASTHMA 08/01/2006  . CAD (coronary artery disease)    a. 01/2010 : Minimal plaque at cardiac catheterization - CFX 20%, EF 70%;  b. 08/2012 Cath: Nl Cors, EF 65%.  . Chronic diastolic CHF (congestive heart failure) ([place]) 04/06/2010   a. In setting of HOCM.  Marland Kitchen Complete heart block ([place]) 02/26/2014   a. s/p MDT dual chamber pacemaker 02/2014  . COPD (chronic obstructive pulmonary disease) ([place])   . DEPRESSION 09/16/2006  . DIVERTICULOSIS, COLON, WITH HEMORRHAGE 04/06/2010  . DM (diabetes mellitus) in pregnancy, delivered w/postpartum condition 08/30/2010  . HYPERLIPIDEMIA 08/01/2006  . HYPERTENSION 08/01/2006  . 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 s/p septal myomectomy  4/12 at [facility] with Dr. Evelina Dun;  b. echo 5/12: EF 60-65%, LVOT peak 18 mmHg; grade 1 diast dysfxn, mild SAM (improved since myomectomy;  c. 03/2012 Echo: EF 60-65%, mod-sev basal septal asymm hypertrophy, basal septal HK, LVOT grad 18mHg, Gr 1 DD, , SAM, Mild MR, nl RV, PASP 339mg; 08/2012 Echo: technically difficult, doubt LVOT obstruction, EF 60%, Gr 1 DD, mod-sev dil LA.  . Impaired glucose tolerance 06/25/2010  . LBBB (left bundle branch block) 06/17/2010  . OBESITY 09/16/2006  . [place]  . SICKLE CELL ANEMIA 08/01/2006  . Type II or unspecified type diabetes mellitus without mention of complication, uncontrolled 08/30/2010  . VENTRAL HERNIA 12/07/2006   Past Surgical History:  Procedure Laterality Date  . ABDOMINAL HYSTERECTOMY  1987  . CARDIAC SURGERY    . COLONOSCOPY N/A 12/26/2016   Procedure: COLONOSCOPY;  Surgeon: JaMilus BanisterMD;  Location: [facility];  Service: Endoscopy;  Laterality: N/A;  . ELECTROPHYSIOLOGY STUDY N/A 09/12/2012   Procedure: ELECTROPHYSIOLOGY STUDY;  Surgeon: StDeboraha SprangMD;  [facility];  Service: Cardiovascular;  Laterality: N/A;  . LEFT HEART CATH  Aug. 18, 2014   Medtronic heart device  . LEFT HEART CATHETERIZATION WITH CORONARY ANGIOGRAM N/A 09/10/2012   Procedure: LEFT HEART CATHETERIZATION WITH CORONARY ANGIOGRAM;  Surgeon: ChBurnell BlanksMD;  [facility];  Service: Cardiovascular;  Laterality: N/A;  . MYOMECTOMY     Septal  . PERMANENT PACEMAKER INSERTION N/A 02/26/2014   MDT Advisa dual chamber pacemaker implanted by  Dr Caryl Comes for heart block  . VENTRAL HERNIA REPAIR  06/2006    reports that she quit smoking about 18 years ago. Her smoking use included cigarettes. She has a 5.00 pack-year smoking history. she has never used smokeless tobacco. She reports that she does not drink alcohol or use drugs. family history includes Cancer in her daughter; Clotting disorder in her daughter and other; Colon cancer in  her daughter; Diabetes in her paternal grandmother; Heart attack in her father and sister; Heart disease in her daughter and father; Hyperlipidemia in her brother, daughter, and sister; Hypertension in her brother, father, sister, and son; Stroke in her sister. Allergies  Allergen Reactions  . Dairy Aid [Lactase] Diarrhea and Other (See Comments)    Bloating and gastric distress  . Metformin And Related Other (See Comments)    GI upset  . Ace Inhibitors Other (See Comments)    REACTION: angioedema right eye  . Aspirin Other (See Comments)    Bleeding   . Codeine Other (See Comments)    Hallucinations, can take if with someone.  . Soy Allergy Other (See Comments)    Bleeding & cramps   Current Outpatient Medications on File Prior to Visit  Medication Sig Dispense Refill  . acetaminophen (TYLENOL) 500 MG tablet Take 500 mg by mouth daily as needed.    Marland Kitchen albuterol (PROVENTIL HFA;VENTOLIN HFA) 108 (90 BASE) MCG/ACT inhaler Inhale 2 puffs into the lungs every 6 (six) hours as needed for wheezing or shortness of breath. 18 g 5  . amLODipine (NORVASC) 10 MG tablet TAKE 1 TABLET BY MOUTH EVERY DAY 90 tablet 3  . atorvastatin (LIPITOR) 40 MG tablet TAKE 1 TABLET(40 MG) BY MOUTH DAILY 90 tablet 1  . Blood Glucose Monitoring Suppl (ONE TOUCH ULTRA 2) W/DEVICE KIT Use to check blood sugars daily Dx E11.9 1 each 0  . cetirizine (ZYRTEC) 10 MG tablet Take 10 mg by mouth daily.      Marland Kitchen glipiZIDE (GLUCOTROL XL) 10 MG 24 hr tablet Take 1 tablet (10 mg total) by mouth daily with breakfast. 90 tablet 3  . glucose blood test strip 1 each by Other route daily. Use to check blood sugar daily Dx e11.9 100 each 11  . hydrocortisone (ANUSOL-HC) 2.5 % rectal cream Place rectally 2 (two) times daily. 30 g 1  . irbesartan (AVAPRO) 300 MG tablet TAKE 1 TABLET(300 MG) BY MOUTH AT BEDTIME 90 tablet 3  . meclizine (ANTIVERT) 12.5 MG tablet Take 1 tablet (12.5 mg total) by mouth 3 (three) times daily as needed for  dizziness. 40 tablet 0  . metoprolol succinate (TOPROL-XL) 100 MG 24 hr tablet TAKE 1 TABLET BY MOUTH EVERY DAY 90 tablet 1  . ondansetron (ZOFRAN) 4 MG tablet Take 1 tablet (4 mg total) by mouth every 8 (eight) hours as needed for nausea or vomiting. As needed for nausea or vomiting. 30 tablet 1  . ONETOUCH DELICA LANCETS 63K MISC USE TO HELP CHECK BLOOD SUGAR DAILY 200 each 0  . oxcarbazepine (TRILEPTAL) 600 MG tablet Take 600 mg by mouth 2 (two) times daily.    . potassium chloride (K-DUR) 10 MEQ tablet Take 1 tablet (10 mEq total) by mouth 2 (two) times daily. 90 tablet 11  . topiramate (TOPAMAX) 100 MG tablet Take 100 mg by mouth daily.   0   No current facility-administered medications on file prior to visit.    Review of Systems Constitutional: Negative for other unusual diaphoresis, sweats, appetite or weight  changes HENT: Negative for other worsening hearing loss, ear pain, facial swelling, mouth sores or neck stiffness.   Eyes: Negative for other worsening pain, redness or other visual disturbance.  Respiratory: Negative for other stridor or swelling Cardiovascular: Negative for other palpitations or other chest pain  Gastrointestinal: Negative for worsening diarrhea or loose stools, blood in stool, distention or other pain Genitourinary: Negative for hematuria, flank pain or other change in urine volume.  Musculoskeletal: Negative for myalgias or other joint swelling.  Skin: Negative for other color change, or other wound or worsening drainage.  Neurological: Negative for other syncope or numbness. Hematological: Negative for other adenopathy or swelling Psychiatric/Behavioral: Negative for hallucinations, other worsening agitation, SI, self-injury, or new decreased concentration All other system neg per pt     Objective:   Physical Exam  VS noted,  Constitutional: Pt is oriented to person, place, and time. Appears well-developed and well-nourished, in no significant distress  and comfortable Head: Normocephalic and atraumatic  Eyes: Conjunctivae and EOM are normal. Pupils are equal, round, and reactive to light Right Ear: External ear normal without discharge Left Ear: External ear normal without discharge Nose: Nose without discharge or deformity Mouth/Throat: Oropharynx is without other ulcerations and moist  Neck: Normal range of motion. Neck supple. No JVD present. No tracheal deviation present or significant neck LA or mass Cardiovascular: Normal rate, regular rhythm, normal heart sounds and intact distal pulses.   Pulmonary/Chest: WOB normal and breath sounds without rales or wheezing  Abdominal: Soft. Bowel sounds are normal. NT. No HSM  Musculoskeletal: Normal range of motion. Exhibits no edema Lymphadenopathy: Has no other cervical adenopathy.  Neurological: Pt is alert and oriented to person, place, and time. Pt has normal reflexes. No cranial nerve deficit. Motor grossly intact, Gait intact Skin: Skin is warm and dry. No rash noted or new ulcerations Psychiatric:  Has normal mood and affect. Behavior is normal without agitation No other exam findings    Assessment & Plan:

## 2017-01-11 ENCOUNTER — Telehealth: Payer: Self-pay

## 2017-01-11 NOTE — Telephone Encounter (Signed)
Pt has been informed and expressed understanding.  

## 2017-01-11 NOTE — Telephone Encounter (Signed)
-----   Message from Corwin LevinsJames W John, MD sent at 01/10/2017  8:25 PM EST ----- Letter sent, cont same tx except  The test results show that your current treatment is OK, except the Triglycerides are very high.  Please follow a lower fat diet, and also start fenofibrate 160 mg per day,  I will send a new prescription, and you will be called from the office as well.Stacey [name].    Shirron to please inform pt, I will do rx

## 2017-01-26 ENCOUNTER — Other Ambulatory Visit (HOSPITAL_BASED_OUTPATIENT_CLINIC_OR_DEPARTMENT_OTHER): Payer: PPO

## 2017-01-26 ENCOUNTER — Ambulatory Visit: Payer: PPO | Admitting: Hematology

## 2017-01-26 ENCOUNTER — Other Ambulatory Visit: Payer: Self-pay | Admitting: Hematology

## 2017-01-26 ENCOUNTER — Encounter: Payer: Self-pay | Admitting: Hematology

## 2017-01-26 DIAGNOSIS — I10 Essential (primary) hypertension: Secondary | ICD-10-CM

## 2017-01-26 DIAGNOSIS — I5032 Chronic diastolic (congestive) heart failure: Secondary | ICD-10-CM

## 2017-01-26 DIAGNOSIS — K5791 Diverticulosis of intestine, part unspecified, without perforation or abscess with bleeding: Secondary | ICD-10-CM | POA: Diagnosis not present

## 2017-01-26 DIAGNOSIS — J449 Chronic obstructive pulmonary disease, unspecified: Secondary | ICD-10-CM | POA: Diagnosis not present

## 2017-01-26 DIAGNOSIS — D571 Sickle-cell disease without crisis: Secondary | ICD-10-CM

## 2017-01-26 DIAGNOSIS — D682 Hereditary deficiency of other clotting factors: Secondary | ICD-10-CM

## 2017-01-26 DIAGNOSIS — D649 Anemia, unspecified: Secondary | ICD-10-CM

## 2017-01-26 DIAGNOSIS — D500 Iron deficiency anemia secondary to blood loss (chronic): Secondary | ICD-10-CM

## 2017-01-26 LAB — FERRITIN: Ferritin: 48 ng/ml (ref 9–269)

## 2017-01-26 LAB — CBC & DIFF AND RETIC
BASO%: 0.4 % (ref 0.0–2.0)
Basophils Absolute: 0 10*3/uL (ref 0.0–0.1)
EOS%: 1.2 % (ref 0.0–7.0)
Eosinophils Absolute: 0.1 10*3/uL (ref 0.0–0.5)
HCT: 39.3 % (ref 34.8–46.6)
HGB: 12.3 g/dL (ref 11.6–15.9)
Immature Retic Fract: 12.9 % — ABNORMAL HIGH (ref 1.60–10.00)
LYMPH%: 23.6 % (ref 14.0–49.7)
MCH: 24.8 pg — ABNORMAL LOW (ref 25.1–34.0)
MCHC: 31.3 g/dL — ABNORMAL LOW (ref 31.5–36.0)
MCV: 79.4 fL — ABNORMAL LOW (ref 79.5–101.0)
MONO#: 0.2 10*3/uL (ref 0.1–0.9)
MONO%: 4.2 % (ref 0.0–14.0)
NEUT#: 4 10*3/uL (ref 1.5–6.5)
NEUT%: 70.6 % (ref 38.4–76.8)
Platelets: 241 10*3/uL (ref 145–400)
RBC: 4.95 10*6/uL (ref 3.70–5.45)
RDW: 15.6 % — ABNORMAL HIGH (ref 11.2–14.5)
Retic %: 2.79 % — ABNORMAL HIGH (ref 0.70–2.10)
Retic Ct Abs: 138.11 10*3/uL — ABNORMAL HIGH (ref 33.70–90.70)
WBC: 5.7 10*3/uL (ref 3.9–10.3)
lymph#: 1.3 10*3/uL (ref 0.9–3.3)

## 2017-01-26 LAB — IRON AND TIBC
%SAT: 16 % — ABNORMAL LOW (ref 21–57)
Iron: 53 ug/dL (ref 41–142)
TIBC: 328 ug/dL (ref 236–444)
UIBC: 274 ug/dL (ref 120–384)

## 2017-01-26 NOTE — Progress Notes (Signed)
[place]  [phone]   HEMATOLOGY ONCOLOGY OUTPATIENT FOLLOW UP  Stacey [name]  DOB: 20-May-1950  MR#: [id number]  CSN#: [id number]    Patient Care Team: Biagio Borg, MD as PCP - General (Internal Medicine) Deboraha Sprang, MD as Consulting Physician (Cardiology) Gatha Mayer, MD as Consulting Physician (Gastroenterology)  Reason for initial consult: f/u hypofibrinogenemia   History of present illness (12/23/2016):   67 y.o.female with significant medical history of sickle cell, renal artery aneurysm, HTN, HLD diverticulosis, depression, COPD, pacemaker, chronic diastolic CHF, and CAD. She presented to the ED on 12/21/17 for Lower GI bleed, LLQ abdominal pain.  She was evaluated by GI Dr. Fuller Plan who feels her GI bleeding is likely related to her know diverticulosis.  She had a similar no GI bleeding several years ago.  She has a mild anemia, has not required any blood transfusion. I was called to see patient to rule out bleeding disorder.  Pt states she has had bleeding issues since her childhood, especially after surgeries or injuries.  She had endometrial ablation in her 67s, developed a severe bleeding, which required hysterectomy.  She also had prolonged bleeding after her cardiac surgery in 2012, which required blood transfusion.  She has had 3 vaginal delivery, bleeding was not excessive, did not require blood transfusion.  When she was in the hospital for cardiac surgery in 2020, she had a fibrinogen level checked several times, which or below 100.  She received cryoprecipitation after her heart surgery.  1 of her daughters and granddaughters also had a similar bleeding disorder, her daughter was tested and found to have a gene mutation which corresponding to her bleeding disorder.  Patient is not sure which gene was positive.  INTERIM HISTORY: Stacey [name] presents into the clinic today for hospital follow up. Since being  discharged at the end of November she reports that she has been doing very well overall. She has not had any recurrence in bleeding since her endoscopy with clip placements. She has had f/u with her Gastro and PCP, they are currently waiting on our recommendation. She does note some ongoing fatigue. She is not currently on iron supplements.   MEDICAL HISTORY:  Past Medical History:  Diagnosis Date  . ANGIOEDEMA 03/07/2008   a. with ACE-I  . ANXIETY 09/16/2006  . ASTHMA 08/01/2006  . CAD (coronary artery disease)    a. 01/2010 : Minimal plaque at cardiac catheterization - CFX 20%, EF 70%;  b. 08/2012 Cath: Nl Cors, EF 65%.  . Chronic diastolic CHF (congestive heart failure) ([place]) 04/06/2010   a. In setting of HOCM.  Marland Kitchen Complete heart block ([place]) 02/26/2014   a. s/p MDT dual chamber pacemaker 02/2014  . COPD (chronic obstructive pulmonary disease) ([place])   . DEPRESSION 09/16/2006  . DIVERTICULOSIS, COLON, WITH HEMORRHAGE 04/06/2010  . DM (diabetes mellitus) in pregnancy, delivered w/postpartum condition 08/30/2010  . HYPERLIPIDEMIA 08/01/2006  . HYPERTENSION 08/01/2006  . 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 s/p septal myomectomy 4/12 at [facility] with Dr. Evelina Dun;  b. echo 5/12: EF 60-65%, LVOT peak 18 mmHg; grade 1 diast dysfxn, mild SAM (improved since myomectomy;  c. 03/2012 Echo: EF 60-65%, mod-sev basal septal asymm hypertrophy, basal septal HK, LVOT grad 28mHg, Gr 1 DD, , SAM, Mild MR, nl RV, PASP 395mg; 08/2012 Echo: technically difficult, doubt LVOT obstruction, EF 60%, Gr 1 DD, mod-sev dil LA.  . Impaired glucose tolerance 06/25/2010  . LBBB (left bundle branch  block) 06/17/2010  . OBESITY 09/16/2006  . Renal artery aneurysm ([place])   . SICKLE CELL ANEMIA 08/01/2006  . Type II or unspecified type diabetes mellitus without mention of complication, uncontrolled 08/30/2010  . VENTRAL HERNIA 12/07/2006    SURGICAL HISTORY: Past Surgical History:  Procedure Laterality Date  . ABDOMINAL HYSTERECTOMY  1987  .  CARDIAC SURGERY    . COLONOSCOPY N/A 12/26/2016   Procedure: COLONOSCOPY;  Surgeon: Milus Banister, MD;  Location: [facility] ENDOSCOPY;  Service: Endoscopy;  Laterality: N/A;  . ELECTROPHYSIOLOGY STUDY N/A 09/12/2012   Procedure: ELECTROPHYSIOLOGY STUDY;  Surgeon: Deboraha Sprang, MD;  Location: [facility] CATH LAB;  Service: Cardiovascular;  Laterality: N/A;  . LEFT HEART CATH  Aug. 18, 2014   Medtronic heart device  . LEFT HEART CATHETERIZATION WITH CORONARY ANGIOGRAM N/A 09/10/2012   Procedure: LEFT HEART CATHETERIZATION WITH CORONARY ANGIOGRAM;  Surgeon: Burnell Blanks, MD;  Location: [facility] CATH LAB;  Service: Cardiovascular;  Laterality: N/A;  . MYOMECTOMY     Septal  . PERMANENT PACEMAKER INSERTION N/A 02/26/2014   MDT Advisa dual chamber pacemaker implanted by Dr Caryl Comes for heart block  . VENTRAL HERNIA REPAIR  06/2006    SOCIAL HISTORY: Social History   Socioeconomic History  . Marital status: Married    Spouse name: Not on file  . Number of children: 3  . Years of education: Not on file  . Highest education level: Not on file  Social Needs  . Financial resource strain: Not on file  . Food insecurity - worry: Not on file  . Food insecurity - inability: Not on file  . Transportation needs - medical: Not on file  . Transportation needs - non-medical: Not on file  Occupational History  . Occupation: retired Product manager: Psychologist, sport and exercise [employer]  Tobacco Use  . Smoking status: Former Smoker    Packs/day: 0.25    Years: 20.00    Pack years: 5.00    Types: Cigarettes    Last attempt to quit: 01/24/1998    Years since quitting: 19.0  . Smokeless tobacco: Never Used  . Tobacco comment: Quit smoking 2001. Smoked on and off for 20 years. Smoked 2-3 cigars daily  Substance and Sexual Activity  . Alcohol use: No    Alcohol/week: 0.0 oz  . Drug use: No  . Sexual activity: Not on file  Other Topics Concern  . Not on file  Social History Narrative   Lives in [place] with husband.  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    FAMILY HISTORY: Family History  Problem Relation Age of Onset  . Heart disease Father   . Heart attack Father   . Hypertension Father        before age 72  . Colon cancer Daughter        and bleeding disorder  . Hyperlipidemia Daughter   . Heart disease Daughter   . Cancer Daughter   . Heart attack Sister   . Hyperlipidemia Sister   . Stroke Sister   . Hypertension Sister   . Hyperlipidemia Brother   . Hypertension Brother   . Diabetes Paternal Grandmother   . Clotting disorder Daughter   . Clotting disorder Other        granddaughter  . Hypertension Son     ALLERGIES:  is allergic to dairy aid [lactase]; metformin and related; ace inhibitors; aspirin; codeine; and soy allergy.  MEDICATIONS:  Current Outpatient Medications  Medication Sig Dispense Refill  . acetaminophen (TYLENOL) 500 MG tablet Take 500 mg by mouth daily as needed.    Marland Kitchen albuterol (PROVENTIL HFA;VENTOLIN HFA) 108 (90 BASE) MCG/ACT inhaler Inhale 2 puffs into the lungs every 6 (six) hours as needed for wheezing or shortness of breath. 18 g 5  . amLODipine (NORVASC) 10 MG tablet TAKE 1 TABLET BY MOUTH EVERY DAY 90 tablet 3  . atorvastatin (LIPITOR) 40 MG tablet TAKE 1 TABLET(40 MG) BY MOUTH DAILY 90 tablet 1  . Blood Glucose Monitoring Suppl (ONE TOUCH ULTRA 2) W/DEVICE KIT Use to check blood sugars daily Dx E11.9 1 each 0  . cetirizine (ZYRTEC) 10 MG tablet Take 10 mg by mouth daily.      . fenofibrate (TRICOR) 145 MG tablet Take 1 tablet (145 mg total) by mouth daily. 90 tablet 3  . glipiZIDE (GLUCOTROL XL) 10 MG 24 hr tablet Take 1 tablet (10 mg total) by mouth daily with breakfast. 90 tablet 3  . glucose blood test strip 1 each by Other route daily. Use to check blood sugar daily Dx e11.9 100 each 11  . hydrocortisone (ANUSOL-HC) 2.5 % rectal cream Place rectally 2 (two) times daily. 30 g 1  . irbesartan (AVAPRO) 300 MG tablet TAKE 1 TABLET(300  MG) BY MOUTH AT BEDTIME 90 tablet 3  . meclizine (ANTIVERT) 12.5 MG tablet Take 1 tablet (12.5 mg total) by mouth 3 (three) times daily as needed for dizziness. 40 tablet 0  . metoprolol succinate (TOPROL-XL) 100 MG 24 hr tablet TAKE 1 TABLET BY MOUTH EVERY DAY 90 tablet 1  . ondansetron (ZOFRAN) 4 MG tablet Take 1 tablet (4 mg total) by mouth every 8 (eight) hours as needed for nausea or vomiting. As needed for nausea or vomiting. 30 tablet 1  . ONETOUCH DELICA LANCETS 01S MISC USE TO HELP CHECK BLOOD SUGAR DAILY 200 each 0  . oxcarbazepine (TRILEPTAL) 600 MG tablet Take 600 mg by mouth 2 (two) times daily.    . potassium chloride (K-DUR) 10 MEQ tablet Take 1 tablet (10 mEq total) by mouth 2 (two) times daily. 90 tablet 11  . topiramate (TOPAMAX) 100 MG tablet Take 100 mg by mouth daily.   0   No current facility-administered medications for this visit.     REVIEW OF SYSTEMS:   Constitutional: Denies fevers, chills or abnormal night sweats Eyes: Denies blurriness of vision, double vision or watery eyes Ears, nose, mouth, throat, and face: Denies mucositis or sore throat Respiratory: Denies cough, dyspnea or wheezes Cardiovascular: Denies palpitation, chest discomfort or lower extremity swelling Gastrointestinal:  Denies nausea, heartburn or change in bowel habits Skin: Denies abnormal skin rashes Lymphatics: Denies new lymphadenopathy or easy bruising Neurological:Denies numbness, tingling or new weaknesses Behavioral/Psych: Mood is stable, no new changes  All other systems were reviewed with the patient and are negative.  PHYSICAL EXAMINATION:  ECOG PERFORMANCE STATUS: 0  Vitals:   01/26/17 1024  BP: (!) 154/79  Pulse: 84  Resp: 16  Temp: 98.5 F (36.9 C)  SpO2: 98%   Filed Weights   01/26/17 1024  Weight: 171 lb 9.6 oz (77.8 kg)    GENERAL:alert, no distress and comfortable SKIN: skin color, texture, turgor are normal, no rashes or significant lesions EYES: normal,  conjunctiva are pink and non-injected, sclera clear OROPHARYNX:no exudate, no erythema and lips, buccal mucosa, and tongue normal  NECK: supple, thyroid normal size, non-tender, without nodularity LYMPH:  no palpable lymphadenopathy in the cervical, axillary  or inguinal LUNGS: clear to auscultation and percussion with normal breathing effort HEART: regular rate & rhythm and no murmurs and no lower extremity edema ABDOMEN:abdomen soft, non-tender and normal bowel sounds Musculoskeletal:no cyanosis of digits and no clubbing  PSYCH: alert & oriented x 3 with fluent speech NEURO: no focal motor/sensory deficits  LABORATORY DATA:  I have reviewed the data as listed Lab Results  Component Value Date   WBC 5.7 01/26/2017   HGB 12.3 01/26/2017   HCT 39.3 01/26/2017   MCV 79.4 (L) 01/26/2017   PLT 241 01/26/2017   Recent Labs    02/26/16 1055 12/21/16 1551 12/22/16 0140 12/26/16 0408 01/10/17 1635  NA 140 139 143 141 140  K 4.2 4.2 3.6 3.6 3.8  CL 104 108 112* 111 105  CO2 _0 21* 28  GLUCOSE 189* 169* 165* 171* 277*  BUN _1 CREATININE 0.71 0.78 0.63 0.62 0.81  CALCIUM 9.7 9.0 9.0 9.4 9.2  GFRNONAA  --  >60 >60 >60  --   GFRAA  --  >60 >60 >60  --   PROT 7.6 7.2  --  7.2 7.2  ALBUMIN 4.5 4.0  --  4.1 4.2  AST 23 27  --  41 39*  ALT 35 29  --  37 34  ALKPHOS 166* 161*  --  145* 150*  BILITOT 0.5 0.5  --  0.6 0.3  BILIDIR 0.1  --   --   --  0.1    RADIOGRAPHIC STUDIES: I have personally reviewed the radiological images as listed and agreed with the findings in the report. No results found.  ASSESSMENT & PLAN:  67 y.o. female with significant medical history of sickle cell, renal artery aneurysm, HTN, HLD diverticulosis, depression, COPD, pacemaker, chronic diastolic CHF, and CAD. She presented to the ED on 12/21/17 for Lower GI bleed, LLQ abdominal pain.   1. 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  She was found to have low fiber nutrition level during her heart surgery IN 2012, and her fibrinogen level was low when she had a GI bleeding in November 2018. -She has not had any recurrence in bleeding since her admission and endoscopy.  -We are currently checking her fibrinogen level, CBC, Ferritin, and we will screen for fibrinogen alpha chain gene (FGA) mutation, which is the most common gene mutation because congenital hypofibrinogenemia.  -We discussed the risk of bleeding from her fibrinogen anemia, especially during her surgery or episodes of bleeding.  She does not need prophylactic fibrinogen infusion. -I will call her lab results when it's back   2. Low GI bleeding from diverticulosis -She had episode of lower GI bleeding in November 2018, colonoscopy showed diffuse diverticulosis in the entire colon, status post endoscopy clip placement for active bleeding. -I will repeat her CBC and iron study today  -If her Ferritin and Iron levels are mildly low, I will begin her on supplements and she can f/u PRN. If she were to have severely decreased levels we will have to monitor her more closely.   3. Genetics -I did discuss with her the hereditably of her condition. If she tests positive on our future tests I have recommended for her children and grandchildren to be tested for this as well.    4. HTN, COPD, CHF, CAD  -She will continue to f/u with her PCP for ongoing management of her chronic  health co-morbidities.   PLAN:  -Labs today, I will call her in a few weeks when results are back  -f/u in clinic PRN  All questions were answered. The patient knows to call the clinic with any problems, questions or concerns.  This document serves as a record of services personally performed by Truitt Merle, MD. It was created on her behalf by Reola Mosher, a trained medical scribe. The creation of this record is based on  the scribe's personal observations and the provider's statements to them. This document has been checked and approved by the attending provider.  I have reviewed the above documentation for accuracy and completeness, and I agree with the above.     Truitt Merle, MD 01/26/2017 2:05 PM

## 2017-01-27 LAB — FIBRINOGEN

## 2017-01-29 ENCOUNTER — Other Ambulatory Visit: Payer: Self-pay | Admitting: Internal Medicine

## 2017-02-10 ENCOUNTER — Other Ambulatory Visit: Payer: Self-pay | Admitting: Internal Medicine

## 2017-02-19 ENCOUNTER — Other Ambulatory Visit: Payer: Self-pay | Admitting: Internal Medicine

## 2017-02-20 ENCOUNTER — Ambulatory Visit (INDEPENDENT_AMBULATORY_CARE_PROVIDER_SITE_OTHER): Payer: PPO | Admitting: *Deleted

## 2017-02-20 DIAGNOSIS — I442 Atrioventricular block, complete: Secondary | ICD-10-CM

## 2017-02-20 NOTE — Progress Notes (Signed)
Remote pacemaker transmission.   

## 2017-02-21 LAB — CUP PACEART REMOTE DEVICE CHECK
Battery Remaining Longevity: 86 mo
Battery Voltage: 3.01 V
Brady Statistic AP VP Percent: 11.97 %
Brady Statistic AP VS Percent: 23.81 %
Brady Statistic AS VP Percent: 23.8 %
Brady Statistic AS VS Percent: 40.41 %
Brady Statistic RA Percent Paced: 35.7 %
Brady Statistic RV Percent Paced: 35.65 %
Date Time Interrogation Session: 20190128141156
Implantable Lead Implant Date: 20160203
Implantable Lead Implant Date: 20160203
Implantable Lead Location: 753859
Implantable Lead Location: 753860
Implantable Lead Model: 5076
Implantable Lead Model: 5076
Implantable Pulse Generator Implant Date: 20160203
Lead Channel Impedance Value: 323 Ohm
Lead Channel Impedance Value: 456 Ohm
Lead Channel Impedance Value: 456 Ohm
Lead Channel Impedance Value: 589 Ohm
Lead Channel Pacing Threshold Amplitude: 0.625 V
Lead Channel Pacing Threshold Amplitude: 1 V
Lead Channel Pacing Threshold Pulse Width: 0.4 ms
Lead Channel Pacing Threshold Pulse Width: 0.4 ms
Lead Channel Sensing Intrinsic Amplitude: 15.5 mV
Lead Channel Sensing Intrinsic Amplitude: 15.5 mV
Lead Channel Sensing Intrinsic Amplitude: 2.375 mV
Lead Channel Sensing Intrinsic Amplitude: 2.375 mV
Lead Channel Setting Pacing Amplitude: 2 V
Lead Channel Setting Pacing Amplitude: 2.5 V
Lead Channel Setting Pacing Pulse Width: 0.4 ms
Lead Channel Setting Sensing Sensitivity: 2.8 mV

## 2017-02-22 ENCOUNTER — Encounter: Payer: Self-pay | Admitting: Cardiology

## 2017-02-25 ENCOUNTER — Other Ambulatory Visit: Payer: Self-pay | Admitting: Internal Medicine

## 2017-02-26 ENCOUNTER — Other Ambulatory Visit: Payer: Self-pay | Admitting: Internal Medicine

## 2017-03-01 LAB — OTHER LAB TEST

## 2017-04-10 ENCOUNTER — Telehealth: Payer: Self-pay | Admitting: Hematology

## 2017-04-10 ENCOUNTER — Telehealth: Payer: Self-pay | Admitting: *Deleted

## 2017-04-10 NOTE — Telephone Encounter (Signed)
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  Her daughter has been tested to and it was positive.  She does not have full siblings.  I will see her as needed, such as pre-op or bleeding issues, she knows to call me.  She appreciated my call.  I will mail her a copy of the genetic testing report.  Malachy MoodYan Sarya Linenberger  04/10/2017

## 2017-04-10 NOTE — Telephone Encounter (Signed)
Mailed genetic testing report to pt's house today as per Dr. Latanya MaudlinFeng's instructions.

## 2017-04-18 ENCOUNTER — Other Ambulatory Visit: Payer: Self-pay | Admitting: Internal Medicine

## 2017-04-18 DIAGNOSIS — Z1231 Encounter for screening mammogram for malignant neoplasm of breast: Secondary | ICD-10-CM

## 2017-05-09 ENCOUNTER — Ambulatory Visit
Admission: RE | Admit: 2017-05-09 | Discharge: 2017-05-09 | Disposition: A | Discharge status: Home / Self Care | Payer: PPO | Source: Ambulatory Visit | Attending: Internal Medicine | Admitting: Internal Medicine

## 2017-05-09 DIAGNOSIS — Z1231 Encounter for screening mammogram for malignant neoplasm of breast: Secondary | ICD-10-CM | POA: Diagnosis not present

## 2017-05-22 ENCOUNTER — Ambulatory Visit (INDEPENDENT_AMBULATORY_CARE_PROVIDER_SITE_OTHER): Payer: PPO | Admitting: *Deleted

## 2017-05-22 DIAGNOSIS — I442 Atrioventricular block, complete: Secondary | ICD-10-CM

## 2017-05-22 NOTE — Progress Notes (Signed)
Remote pacemaker transmission.   

## 2017-05-23 ENCOUNTER — Encounter: Payer: Self-pay | Admitting: Cardiology

## 2017-05-23 ENCOUNTER — Other Ambulatory Visit: Payer: Self-pay | Admitting: Internal Medicine

## 2017-05-24 ENCOUNTER — Other Ambulatory Visit: Payer: Self-pay | Admitting: Internal Medicine

## 2017-05-24 LAB — CUP PACEART REMOTE DEVICE CHECK
Battery Remaining Longevity: 82 mo
Battery Voltage: 3.01 V
Brady Statistic AP VP Percent: 38.1 %
Brady Statistic AP VS Percent: 0.02 %
Brady Statistic AS VP Percent: 61.81 %
Brady Statistic AS VS Percent: 0.07 %
Brady Statistic RA Percent Paced: 37.97 %
Brady Statistic RV Percent Paced: 99.59 %
Date Time Interrogation Session: 20190429120640
Implantable Lead Implant Date: 20160203
Implantable Lead Implant Date: 20160203
Implantable Lead Location: 753859
Implantable Lead Location: 753860
Implantable Lead Model: 5076
Implantable Lead Model: 5076
Implantable Pulse Generator Implant Date: 20160203
Lead Channel Impedance Value: 323 Ohm
Lead Channel Impedance Value: 418 Ohm
Lead Channel Impedance Value: 437 Ohm
Lead Channel Impedance Value: 551 Ohm
Lead Channel Pacing Threshold Amplitude: 0.625 V
Lead Channel Pacing Threshold Amplitude: 0.875 V
Lead Channel Pacing Threshold Pulse Width: 0.4 ms
Lead Channel Pacing Threshold Pulse Width: 0.4 ms
Lead Channel Sensing Intrinsic Amplitude: 2.75 mV
Lead Channel Sensing Intrinsic Amplitude: 2.75 mV
Lead Channel Sensing Intrinsic Amplitude: 5.625 mV
Lead Channel Sensing Intrinsic Amplitude: 5.625 mV
Lead Channel Setting Pacing Amplitude: 2 V
Lead Channel Setting Pacing Amplitude: 2.5 V
Lead Channel Setting Pacing Pulse Width: 0.4 ms
Lead Channel Setting Sensing Sensitivity: 2.8 mV

## 2017-05-28 ENCOUNTER — Other Ambulatory Visit: Payer: Self-pay | Admitting: Internal Medicine

## 2017-06-02 ENCOUNTER — Ambulatory Visit (INDEPENDENT_AMBULATORY_CARE_PROVIDER_SITE_OTHER): Payer: PPO | Admitting: *Deleted

## 2017-06-02 VITALS — BP 134/88 | HR 71 | Resp 17 | Ht 69.0 in | Wt 171.0 lb

## 2017-06-02 DIAGNOSIS — Z0000 Encounter for general adult medical examination without abnormal findings: Secondary | ICD-10-CM | POA: Diagnosis not present

## 2017-06-02 DIAGNOSIS — E2839 Other primary ovarian failure: Secondary | ICD-10-CM

## 2017-06-02 NOTE — Progress Notes (Addendum)
Subjective:   Stacey [name] is a 67 y.o. female who presents for an Initial Medicare Annual Wellness Visit.  Review of Systems    No ROS.  Medicare Wellness Visit. Additional risk factors are reflected in the social history. Cardiac Risk Factors include: advanced age (>47mn, >>45women);diabetes mellitus;dyslipidemia;hypertension Sleep patterns: has frequent nighttime awakenings, gets up 1-2 times nightly to void and sleeps 4-5 hours nightly. Patient reports insomnia issues, discussed recommended sleep tips and stress reduction tips.   Home Safety/Smoke Alarms: Feels safe in home. Smoke alarms in place.  Living environment; residence and Firearm Safety: [place] can live on one level, no firearms., Lives with husband, no needs for DME, good support system Seat Belt Safety/Bike Helmet: Wears seat belt.     Objective:    Today's Vitals   06/02/17 1554  BP: 134/88  Pulse: 71  Resp: 17  SpO2: 97%  Weight: 171 lb (77.6 kg)  Height: '5\' 9"'$  (1.753 m)   Body mass index is 25.25 kg/m.  Advanced Directives 06/02/2017 01/26/2017 12/21/2016 03/02/2016 12/23/2014 02/26/2014 11/11/2013  Does Patient Have a Medical Advance Directive? No No No No No No No  Would patient like information on creating a medical advance directive? No - Patient declined - No - Patient declined - No - patient declined information Yes - Educational materials given No - patient declined information    Current Medications (verified) Outpatient Encounter Medications as of 06/02/2017  Medication Sig  . acetaminophen (TYLENOL) 500 MG tablet Take 500 mg by mouth daily as needed.  .Marland Kitchenalbuterol (PROVENTIL HFA;VENTOLIN HFA) 108 (90 BASE) MCG/ACT inhaler Inhale 2 puffs into the lungs every 6 (six) hours as needed for wheezing or shortness of breath.  .Marland KitchenamLODipine (NORVASC) 10 MG tablet TAKE 1 TABLET BY MOUTH EVERY DAY  . atorvastatin (LIPITOR) 40 MG tablet TAKE 1 TABLET(40 MG) BY MOUTH DAILY  . Blood Glucose  Monitoring Suppl (ONE TOUCH ULTRA 2) W/DEVICE KIT Use to check blood sugars daily Dx E11.9  . cetirizine (ZYRTEC) 10 MG tablet Take 10 mg by mouth daily.    . fenofibrate (TRICOR) 145 MG tablet Take 1 tablet (145 mg total) by mouth daily.  . fenofibrate (TRICOR) 145 MG tablet TAKE 1 TABLET BY MOUTH DAILY  . glipiZIDE (GLUCOTROL XL) 10 MG 24 hr tablet TAKE 1 TABLET BY MOUTH EVERY DAY WITH BREAKFAST  . glucose blood test strip 1 each by Other route daily. Use to check blood sugar daily Dx e11.9  . hydrocortisone (ANUSOL-HC) 2.5 % rectal cream Place rectally 2 (two) times daily.  . irbesartan (AVAPRO) 300 MG tablet TAKE 1 TABLET(300 MG) BY MOUTH AT BEDTIME  . meclizine (ANTIVERT) 12.5 MG tablet Take 1 tablet (12.5 mg total) by mouth 3 (three) times daily as needed for dizziness.  . metoprolol succinate (TOPROL-XL) 100 MG 24 hr tablet TAKE 1 TABLET BY MOUTH EVERY DAY  . ondansetron (ZOFRAN) 4 MG tablet Take 1 tablet (4 mg total) by mouth every 8 (eight) hours as needed for nausea or vomiting. As needed for nausea or vomiting.  .Glory RosebushDELICA LANCETS 325KMISC USE TO HELP CHECK BLOOD SUGAR DAILY  . oxcarbazepine (TRILEPTAL) 600 MG tablet Take 600 mg by mouth 2 (two) times daily.  . potassium chloride (K-DUR) 10 MEQ tablet Take 1 tablet (10 mEq total) by mouth 2 (two) times daily.  .Marland Kitchentopiramate (TOPAMAX) 100 MG tablet TAKE 1 TABLET BY MOUTH EVERY DAY  . [DISCONTINUED] potassium chloride (K-DUR,KLOR-CON) 10 MEQ tablet  TAKE 2 TABLETS BY MOUTH DAILY  . [DISCONTINUED] metoprolol succinate (TOPROL-XL) 100 MG 24 hr tablet TAKE 1 TABLET BY MOUTH EVERY DAY (Patient not taking: Reported on 06/02/2017)   No facility-administered encounter medications on file as of 06/02/2017.     Allergies (verified) Dairy aid [lactase]; Metformin and related; Ace inhibitors; Aspirin; Codeine; and Soy allergy   History: Past Medical History:  Diagnosis Date  . ANGIOEDEMA 03/07/2008   a. with ACE-I  . ANXIETY 09/16/2006    . ASTHMA 08/01/2006  . CAD (coronary artery disease)    a. 01/2010 : Minimal plaque at cardiac catheterization - CFX 20%, EF 70%;  b. 08/2012 Cath: Nl Cors, EF 65%.  . Chronic diastolic CHF (congestive heart failure) ([place]) 04/06/2010   a. In setting of HOCM.  Marland Kitchen Complete heart block ([place]) 02/26/2014   a. s/p MDT dual chamber pacemaker 02/2014  . COPD (chronic obstructive pulmonary disease) ([place])   . DEPRESSION 09/16/2006  . DIVERTICULOSIS, COLON, WITH HEMORRHAGE 04/06/2010  . DM (diabetes mellitus) in pregnancy, delivered w/postpartum condition 08/30/2010  . HYPERLIPIDEMIA 08/01/2006  . HYPERTENSION 08/01/2006  . 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 s/p septal myomectomy 4/12 at [place] with Dr. Evelina Dun;  b. echo 5/12: EF 60-65%, LVOT peak 18 mmHg; grade 1 diast dysfxn, mild SAM (improved since myomectomy;  c. 03/2012 Echo: EF 60-65%, mod-sev basal septal asymm hypertrophy, basal septal HK, LVOT grad 26mHg, Gr 1 DD, , SAM, Mild MR, nl RV, PASP 339mg; 08/2012 Echo: technically difficult, doubt LVOT obstruction, EF 60%, Gr 1 DD, mod-sev dil LA.  . Impaired glucose tolerance 06/25/2010  . LBBB (left bundle branch block) 06/17/2010  . OBESITY 09/16/2006  . [place]  . SICKLE CELL ANEMIA 08/01/2006  . Type II or unspecified type diabetes mellitus without mention of complication, uncontrolled 08/30/2010  . VENTRAL HERNIA 12/07/2006   Past Surgical History:  Procedure Laterality Date  . ABDOMINAL HYSTERECTOMY  1987  . CARDIAC SURGERY    . COLONOSCOPY N/A 12/26/2016   Procedure: COLONOSCOPY;  Surgeon: JaMilus BanisterMD;  Location: [facility];  Service: Endoscopy;  Laterality: N/A;  . ELECTROPHYSIOLOGY STUDY N/A 09/12/2012   Procedure: ELECTROPHYSIOLOGY STUDY;  Surgeon: StDeboraha SprangMD;  [facility];  Service: Cardiovascular;  Laterality: N/A;  . LEFT HEART CATH  Aug. 18, 2014   Medtronic heart device  . LEFT HEART CATHETERIZATION WITH CORONARY ANGIOGRAM N/A 09/10/2012   Procedure:  LEFT HEART CATHETERIZATION WITH CORONARY ANGIOGRAM;  Surgeon: ChBurnell BlanksMD;  [facility];  Service: Cardiovascular;  Laterality: N/A;  . MYOMECTOMY     Septal  . PERMANENT PACEMAKER INSERTION N/A 02/26/2014   MDT Advisa dual chamber pacemaker implanted by Dr KlCaryl Comesor heart block  . VENTRAL HERNIA REPAIR  06/2006   Family History  Problem Relation Age of Onset  . Heart disease Father   . Heart attack Father   . Hypertension Father        before age 66. Colon cancer Daughter        and bleeding disorder  . Hyperlipidemia Daughter   . Heart disease Daughter   . Cancer Daughter   . Heart attack Sister   . Hyperlipidemia Sister   . Stroke Sister   . Hypertension Sister   . Hyperlipidemia Brother   . Hypertension Brother   . Diabetes Paternal Grandmother   . Clotting disorder Daughter   . Clotting disorder Other  granddaughter  . Hypertension Son    Social History   Socioeconomic History  . Marital status: Married    Spouse name: Not on file  . Number of children: 3  . Years of education: Not on file  . Highest education level: Not on file  Occupational History  . Occupation: retired Product manager: [employer] [place]  . Financial resource strain: Hard  . Food insecurity:    Worry: Sometimes true    Inability: Sometimes true  . Transportation needs:    Medical: No    Non-medical: No  Tobacco Use  . Smoking status: Former Smoker    Packs/day: 0.25    Years: 20.00    Pack years: 5.00    Types: Cigarettes    Last attempt to quit: 01/24/1998    Years since quitting: 19.3  . Smokeless tobacco: Never Used  . Tobacco comment: Quit smoking 2001. Smoked on and off for 20 years. Smoked 2-3 cigars daily  Substance and Sexual Activity  . Alcohol use: No    Alcohol/week: 0.0 oz  . Drug use: No  . Sexual activity: Not Currently  Lifestyle  . Physical activity:    Days per week: 0 days    Minutes per session: 0 min  .  Stress: Very much  Relationships  . Social connections:    Talks on phone: Once a week    Gets together: Once a week    Attends religious service: Never    Active member of club or organization: No    Attends meetings of clubs or organizations: Never    Relationship status: Married  Other Topics Concern  . Not on file  Social History Narrative   Lives in [place] with husband.  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    Tobacco Counseling Counseling given: Not Answered Comment: Quit smoking 2001. Smoked on and off for 20 years. Smoked 2-3 cigars daily  Activities of Daily Living In your present state of health, do you have any difficulty performing the following activities: 06/02/2017 12/22/2016  Hearing? N -  Vision? N -  Difficulty concentrating or making decisions? N -  Walking or climbing stairs? N -  Dressing or bathing? N -  Doing errands, shopping? N N  Preparing Food and eating ? N -  Using the Toilet? N -  In the past six months, have you accidently leaked urine? N -  Do you have problems with loss of bowel control? N -  Managing your Medications? N -  Managing your Finances? N -  Housekeeping or managing your Housekeeping? N -  Some recent data might be hidden   Immunizations and Health Maintenance Immunization History  Administered Date(s) Administered  . Influenza Whole 11/10/2008, 10/13/2009  . Influenza, High Dose Seasonal PF 10/26/2016  . Influenza,inj,Quad PF,6+ Mos 10/23/2013  . Influenza-Unspecified 12/04/2014  . Pneumococcal Conjugate-13 01/10/2017  . Pneumococcal Polysaccharide-23 09/29/2005, 08/30/2010, 01/28/2014  . Td 01/24/1993, 12/10/2008   Health Maintenance Due  Topic Date Due  . OPHTHALMOLOGY EXAM  07/10/2015  . DEXA SCAN  07/31/2016    Patient Care Team: Biagio Borg, MD as PCP - General (Internal Medicine) Deboraha Sprang, MD as Consulting Physician (Cardiology) Gatha Mayer, MD as Consulting Physician  (Gastroenterology)  Indicate any recent Medical Services you may have received from other than [facility] providers in the past year (date may be approximate).     Assessment:   This  is a routine wellness examination for Stacey [name]. Physical assessment deferred to PCP.   Hearing/Vision screen Hearing Screening Comments: Able to hear conversational tones w/o difficulty. No issues reported.  Passed whisper test Vision Screening Comments: Dr. Bing Plume, patient states she will call to make an appointment.     Dietary issues and exercise activities discussed: Current Exercise Habits: The patient does not participate in regular exercise at present, Exercise limited by: None identified  Diet (meal preparation, eat out, water intake, caffeinated beverages, dairy products, fruits and vegetables): in general, a "healthy" diet  , well balanced   Reviewed heart healthy and diabetic diet, encouraged patient to increase daily water intake.  Goals    . Patient Stated     Take travel day trips for my enjoyment, start to go to [employer]. Take a train trip in the future and go to my family reunion in September. Enjoy my family and grand-children.       Depression Screen PHQ 2/9 Scores 06/02/2017 01/10/2017 09/04/2015 01/28/2014 08/14/2012  PHQ - 2 Score 3 0 0 0 0  PHQ- 9 Score 4 - - - -    Fall Risk Fall Risk  06/02/2017 01/10/2017 09/04/2015 01/28/2014 08/14/2012  Falls in the past year? No No No No Yes  Number falls in past yr: - - - - 1  Injury with Fall? - - - - Yes   Cognitive Function:       Ad8 score reviewed for issues:  Issues making decisions: no  Less interest in hobbies / activities: no  Repeats questions, stories (family complaining): no  Trouble using ordinary gadgets (microwave, computer, phone):no  Forgets the month or year: no  Mismanaging finances: no  Remembering appts: no  Daily problems with thinking and/or memory: no Ad8 score is= 0  Screening Tests Health  Maintenance  Topic Date Due  . OPHTHALMOLOGY EXAM  07/10/2015  . DEXA SCAN  07/31/2016  . HEMOGLOBIN A1C  07/11/2017  . INFLUENZA VACCINE  08/24/2017  . FOOT EXAM  01/10/2018  . TETANUS/TDAP  12/11/2018  . PNA vac Low Risk Adult (2 of 2 - PPSV23) 01/29/2019  . MAMMOGRAM  05/10/2019  . COLONOSCOPY  12/26/2021  . Hepatitis C Screening  Completed  . HIV Screening  Completed     Plan:    Continue doing brain stimulating activities (puzzles, reading, adult coloring books, staying active) to keep memory sharp.   Continue to eat heart healthy diet (full of fruits, vegetables, whole grains, lean protein, water--limit salt, fat, and sugar intake) and increase physical activity as tolerated.  I have personally reviewed and noted the following in the patient's chart:   . Medical and social history . Use of alcohol, tobacco or illicit drugs  . Current medications and supplements . Functional ability and status . Nutritional status . Physical activity . Advanced directives . List of other physicians . Vitals . Screenings to include cognitive, depression, and falls . Referrals and appointments  In addition, I have reviewed and discussed with patient certain preventive protocols, quality metrics, and best practice recommendations. A written personalized care plan for preventive services as well as general preventive health recommendations were provided to patient.     Michiel Cowboy, RN   06/02/2017   Medical screening examination/treatment/procedure(s) were performed by non-physician practitioner and as supervising physician I was immediately available for consultation/collaboration. I agree with above. Cathlean Cower, MD

## 2017-06-02 NOTE — Patient Instructions (Addendum)
[email or web address] or down load app on smart phone  Aunt Berenice Primas website lists multiple social resources for individuals such as: food, health, money, house hold goods, transit, medical supplies, job training and legal services.  Continue doing brain stimulating activities (puzzles, reading, adult coloring books, staying active) to keep memory sharp.   Continue to eat heart healthy diet (full of fruits, vegetables, whole grains, lean protein, water--limit salt, fat, and sugar intake) and increase physical activity as tolerated.   Stacey [name] , Thank you for taking time to come for your Medicare Wellness Visit. I appreciate your ongoing commitment to your health goals. Please review the following plan we discussed and let me know if I can assist you in the future.   These are the goals we discussed: Goals    . Patient Stated     Take travel day trips for my enjoyment, start to go to [employer]. Take a train trip in the future and go to my family reunion in September.       This is a list of the screening recommended for you and due dates:  Health Maintenance  Topic Date Due  . Eye exam for diabetics  07/10/2015  . DEXA scan (bone density measurement)  07/31/2016  . Hemoglobin A1C  07/11/2017  . Flu Shot  08/24/2017  . Complete foot exam   01/10/2018  . Tetanus Vaccine  12/11/2018  . Pneumonia vaccines (2 of 2 - PPSV23) 01/29/2019  . Mammogram  05/10/2019  . Colon Cancer Screening  12/26/2021  .  Hepatitis C: One time screening is recommended by Center for Disease Control  (CDC) for  adults born from [birth year] through 1964.   Completed  . HIV Screening  Completed   Health Maintenance, Female Adopting a healthy lifestyle and getting preventive care can go a long way to promote health and wellness. Talk with your health care provider about what schedule of regular examinations is right for you. This is a good chance for you to check in with your provider about disease prevention  and staying healthy. In between checkups, there are plenty of things you can do on your own. Experts have done a lot of research about which lifestyle changes and preventive measures are most likely to keep you healthy. Ask your health care provider for more information. Weight and diet Eat a healthy diet  Be sure to include plenty of vegetables, fruits, low-fat dairy products, and lean protein.  Do not eat a lot of foods high in solid fats, added sugars, or salt.  Get regular exercise. This is one of the most important things you can do for your health. ? Most adults should exercise for at least 150 minutes each week. The exercise should increase your heart rate and make you sweat (moderate-intensity exercise). ? Most adults should also do strengthening exercises at least twice a week. This is in addition to the moderate-intensity exercise.  Maintain a healthy weight  Body mass index (BMI) is a measurement that can be used to identify possible weight problems. It estimates body fat based on height and weight. Your health care provider can help determine your BMI and help you achieve or maintain a healthy weight.  For females 39 years of age and older: ? A BMI below 18.5 is considered underweight. ? A BMI of 18.5 to 24.9 is normal. ? A BMI of 25 to 29.9 is considered overweight. ? A BMI of 30 and above is considered obese.  Watch levels of cholesterol  and blood lipids  You should start having your blood tested for lipids and cholesterol at 66 years of age, then have this test every 5 years.  You may need to have your cholesterol levels checked more often if: ? Your lipid or cholesterol levels are high. ? You are older than 66 years of age. ? You are at high risk for heart disease.  Cancer screening Lung Cancer  Lung cancer screening is recommended for adults 65-55 years old who are at high risk for lung cancer because of a history of smoking.  A yearly low-dose CT scan of the  lungs is recommended for people who: ? Currently smoke. ? Have quit within the past 15 years. ? Have at least a 30-pack-year history of smoking. A pack year is smoking an average of one pack of cigarettes a day for 1 year.  Yearly screening should continue until it has been 15 years since you quit.  Yearly screening should stop if you develop a health problem that would prevent you from having lung cancer treatment.  Breast Cancer  Practice breast self-awareness. This means understanding how your breasts normally appear and feel.  It also means doing regular breast self-exams. Let your health care provider know about any changes, no matter how small.  If you are in your 20s or 30s, you should have a clinical breast exam (CBE) by a health care provider every 1-3 years as part of a regular health exam.  If you are 2 or older, have a CBE every year. Also consider having a breast X-ray (mammogram) every year.  If you have a family history of breast cancer, talk to your health care provider about genetic screening.  If you are at high risk for breast cancer, talk to your health care provider about having an MRI and a mammogram every year.  Breast cancer gene (BRCA) assessment is recommended for women who have family members with BRCA-related cancers. BRCA-related cancers include: ? Breast. ? Ovarian. ? Tubal. ? Peritoneal cancers.  Results of the assessment will determine the need for genetic counseling and BRCA1 and BRCA2 testing.  Cervical Cancer Your health care provider may recommend that you be screened regularly for cancer of the pelvic organs (ovaries, uterus, and vagina). This screening involves a pelvic examination, including checking for microscopic changes to the surface of your cervix (Pap test). You may be encouraged to have this screening done every 3 years, beginning at age 59.  For women ages 63-65, health care providers may recommend pelvic exams and Pap testing every 3  years, or they may recommend the Pap and pelvic exam, combined with testing for human papilloma virus (HPV), every 5 years. Some types of HPV increase your risk of cervical cancer. Testing for HPV may also be done on women of any age with unclear Pap test results.  Other health care providers may not recommend any screening for nonpregnant women who are considered low risk for pelvic cancer and who do not have symptoms. Ask your health care provider if a screening pelvic exam is right for you.  If you have had past treatment for cervical cancer or a condition that could lead to cancer, you need Pap tests and screening for cancer for at least 20 years after your treatment. If Pap tests have been discontinued, your risk factors (such as having a new sexual partner) need to be reassessed to determine if screening should resume. Some women have medical problems that increase the chance of getting  cervical cancer. In these cases, your health care provider may recommend more frequent screening and Pap tests.  Colorectal Cancer  This type of cancer can be detected and often prevented.  Routine colorectal cancer screening usually begins at 66 years of age and continues through 66 years of age.  Your health care provider may recommend screening at an earlier age if you have risk factors for colon cancer.  Your health care provider may also recommend using home test kits to check for hidden blood in the stool.  A small camera at the end of a tube can be used to examine your colon directly (sigmoidoscopy or colonoscopy). This is done to check for the earliest forms of colorectal cancer.  Routine screening usually begins at age 26.  Direct examination of the colon should be repeated every 5-10 years through 66 years of age. However, you may need to be screened more often if early forms of precancerous polyps or small growths are found.  Skin Cancer  Check your skin from head to toe regularly.  Tell  your health care provider about any new moles or changes in moles, especially if there is a change in a mole's shape or color.  Also tell your health care provider if you have a mole that is larger than the size of a pencil eraser.  Always use sunscreen. Apply sunscreen liberally and repeatedly throughout the day.  Protect yourself by wearing long sleeves, pants, a wide-brimmed hat, and sunglasses whenever you are outside.  Heart disease, diabetes, and high blood pressure  High blood pressure causes heart disease and increases the risk of stroke. High blood pressure is more likely to develop in: ? People who have blood pressure in the high end of the normal range (130-139/85-89 mm Hg). ? People who are overweight or obese. ? People who are African American.  If you are 19-74 years of age, have your blood pressure checked every 3-5 years. If you are 61 years of age or older, have your blood pressure checked every year. You should have your blood pressure measured twice-once when you are at a hospital or clinic, and once when you are not at a hospital or clinic. Record the average of the two measurements. To check your blood pressure when you are not at a hospital or clinic, you can use: ? An automated blood pressure machine at a pharmacy. ? A home blood pressure monitor.  If you are between 59 years and 14 years old, ask your health care provider if you should take aspirin to prevent strokes.  Have regular diabetes screenings. This involves taking a blood sample to check your fasting blood sugar level. ? If you are at a normal weight and have a low risk for diabetes, have this test once every three years after 66 years of age. ? If you are overweight and have a high risk for diabetes, consider being tested at a younger age or more often. Preventing infection Hepatitis B  If you have a higher risk for hepatitis B, you should be screened for this virus. You are considered at high risk for  hepatitis B if: ? You were born in a country where hepatitis B is common. Ask your health care provider which countries are considered high risk. ? Your parents were born in a high-risk country, and you have not been immunized against hepatitis B (hepatitis B vaccine). ? You have HIV or AIDS. ? You use needles to inject street drugs. ? You live  with someone who has hepatitis B. ? You have had sex with someone who has hepatitis B. ? You get hemodialysis treatment. ? You take certain medicines for conditions, including cancer, organ transplantation, and autoimmune conditions.  Hepatitis C  Blood testing is recommended for: ? Everyone born from [birth year] through 1964. ? Anyone with known risk factors for hepatitis C.  Sexually transmitted infections (STIs)  You should be screened for sexually transmitted infections (STIs) including gonorrhea and chlamydia if: ? You are sexually active and are younger than 66 years of age. ? You are older than 66 years of age and your health care provider tells you that you are at risk for this type of infection. ? Your sexual activity has changed since you were last screened and you are at an increased risk for chlamydia or gonorrhea. Ask your health care provider if you are at risk.  If you do not have HIV, but are at risk, it may be recommended that you take a prescription medicine daily to prevent HIV infection. This is called pre-exposure prophylaxis (PrEP). You are considered at risk if: ? You are sexually active and do not regularly use condoms or know the HIV status of your partner(s). ? You take drugs by injection. ? You are sexually active with a partner who has HIV.  Talk with your health care provider about whether you are at high risk of being infected with HIV. If you choose to begin PrEP, you should first be tested for HIV. You should then be tested every 3 months for as long as you are taking PrEP. Pregnancy  If you are premenopausal and you may  become pregnant, ask your health care provider about preconception counseling.  If you may become pregnant, take 400 to 800 micrograms (mcg) of folic acid every day.  If you want to prevent pregnancy, talk to your health care provider about birth control (contraception). Osteoporosis and menopause  Osteoporosis is a disease in which the bones lose minerals and strength with aging. This can result in serious bone fractures. Your risk for osteoporosis can be identified using a bone density scan.  If you are 33 years of age or older, or if you are at risk for osteoporosis and fractures, ask your health care provider if you should be screened.  Ask your health care provider whether you should take a calcium or vitamin D supplement to lower your risk for osteoporosis.  Menopause may have certain physical symptoms and risks.  Hormone replacement therapy may reduce some of these symptoms and risks. Talk to your health care provider about whether hormone replacement therapy is right for you. Follow these instructions at home:  Schedule regular health, dental, and eye exams.  Stay current with your immunizations.  Do not use any tobacco products including cigarettes, chewing tobacco, or electronic cigarettes.  If you are pregnant, do not drink alcohol.  If you are breastfeeding, limit how much and how often you drink alcohol.  Limit alcohol intake to no more than 1 drink per day for nonpregnant women. One drink equals 12 ounces of beer, 5 ounces of wine, or 1 ounces of hard liquor.  Do not use street drugs.  Do not share needles.  Ask your health care provider for help if you need support or information about quitting drugs.  Tell your health care provider if you often feel depressed.  Tell your health care provider if you have ever been abused or do not feel safe at home. This  information is not intended to replace advice given to you by your health care provider. Make sure you  discuss any questions you have with your health care provider. Document Released: 07/26/2010 Document Revised: 06/18/2015 Document Reviewed: 10/14/2014 Elsevier Interactive Patient Education  2018 [place] A hernia happens when an organ or tissue inside your body pushes out through a weak spot in the belly (abdomen). Follow these instructions at home:  Avoid stretching or overusing (straining) the muscles near the hernia.  Do not lift anything heavier than 10 lb (4.5 kg).  Use the muscles in your leg when you lift something up. Do not use the muscles in your back.  When you cough, try to cough gently.  Eat a diet that has a lot of fiber. Eat lots of fruits and vegetables.  Drink enough fluids to keep your pee (urine) clear or pale yellow. Try to drink 6-8 glasses of water a day.  Take medicines to make your poop soft (stool softeners) as told by your doctor.  Lose weight, if you are overweight.  Do not use any tobacco products, including cigarettes, chewing tobacco, or electronic cigarettes. If you need help quitting, ask your doctor.  Keep all follow-up visits as told by your doctor. This is important. Contact a doctor if:  The skin by the hernia gets puffy (swollen) or red.  The hernia is painful. Get help right away if:  You have a fever.  You have belly pain that is getting worse.  You feel sick to your stomach (nauseous) or you throw up (vomit).  You cannot push the hernia back in place by gently pressing on it while you are lying down.  The hernia: ? Changes in shape or size. ? Is stuck outside your belly. ? Changes color. ? Feels hard or tender. This information is not intended to replace advice given to you by your health care provider. Make sure you discuss any questions you have with your health care provider. Document Released: 06/30/2009 Document Revised: 06/18/2015 Document Reviewed: 11/20/2013 Elsevier Interactive Patient Education  [employer].

## 2017-07-10 ENCOUNTER — Other Ambulatory Visit: Payer: Self-pay | Admitting: Internal Medicine

## 2017-07-12 ENCOUNTER — Other Ambulatory Visit (INDEPENDENT_AMBULATORY_CARE_PROVIDER_SITE_OTHER): Payer: PPO

## 2017-07-12 ENCOUNTER — Ambulatory Visit (INDEPENDENT_AMBULATORY_CARE_PROVIDER_SITE_OTHER): Payer: PPO | Admitting: Internal Medicine

## 2017-07-12 ENCOUNTER — Encounter: Payer: Self-pay | Admitting: Internal Medicine

## 2017-07-12 VITALS — BP 158/96 | HR 63 | Temp 98.1°F | Ht 69.0 in | Wt 173.0 lb

## 2017-07-12 DIAGNOSIS — E119 Type 2 diabetes mellitus without complications: Secondary | ICD-10-CM

## 2017-07-12 DIAGNOSIS — K439 Ventral hernia without obstruction or gangrene: Secondary | ICD-10-CM | POA: Diagnosis not present

## 2017-07-12 DIAGNOSIS — K4090 Unilateral inguinal hernia, without obstruction or gangrene, not specified as recurrent: Secondary | ICD-10-CM

## 2017-07-12 DIAGNOSIS — Z0000 Encounter for general adult medical examination without abnormal findings: Secondary | ICD-10-CM | POA: Diagnosis not present

## 2017-07-12 LAB — CBC WITH DIFFERENTIAL/PLATELET
Basophils Absolute: 0 10*3/uL (ref 0.0–0.1)
Basophils Relative: 0.6 % (ref 0.0–3.0)
Eosinophils Absolute: 0.1 10*3/uL (ref 0.0–0.7)
Eosinophils Relative: 1.3 % (ref 0.0–5.0)
HCT: 43 % (ref 36.0–46.0)
Hemoglobin: 14.2 g/dL (ref 12.0–15.0)
Lymphocytes Relative: 24.2 % (ref 12.0–46.0)
Lymphs Abs: 1.4 10*3/uL (ref 0.7–4.0)
MCHC: 32.9 g/dL (ref 30.0–36.0)
MCV: 76.4 fl — ABNORMAL LOW (ref 78.0–100.0)
Monocytes Absolute: 0.2 10*3/uL (ref 0.1–1.0)
Monocytes Relative: 4.1 % (ref 3.0–12.0)
Neutro Abs: 3.9 10*3/uL (ref 1.4–7.7)
Neutrophils Relative %: 69.8 % (ref 43.0–77.0)
Platelets: 191 10*3/uL (ref 150.0–400.0)
RBC: 5.62 Mil/uL — ABNORMAL HIGH (ref 3.87–5.11)
RDW: 16.8 % — ABNORMAL HIGH (ref 11.5–15.5)
WBC: 5.6 10*3/uL (ref 4.0–10.5)

## 2017-07-12 LAB — LIPID PANEL
Cholesterol: 175 mg/dL (ref 0–200)
HDL: 46.7 mg/dL (ref >39.00)
NonHDL: 128.71
Total CHOL/HDL Ratio: 4
Triglycerides: 260 mg/dL — ABNORMAL HIGH (ref 0.0–149.0)
VLDL: 52 mg/dL — ABNORMAL HIGH (ref 0.0–40.0)

## 2017-07-12 LAB — BASIC METABOLIC PANEL
BUN: 14 mg/dL (ref 6–23)
CO2: 27 mEq/L (ref 19–32)
Calcium: 9.6 mg/dL (ref 8.4–10.5)
Chloride: 104 mEq/L (ref 96–112)
Creatinine, Ser: 0.82 mg/dL (ref 0.40–1.20)
GFR: 89.71 mL/min (ref >60.00)
Glucose, Bld: 268 mg/dL — ABNORMAL HIGH (ref 70–99)
Potassium: 4.6 mEq/L (ref 3.5–5.1)
Sodium: 139 mEq/L (ref 135–145)

## 2017-07-12 LAB — HEPATIC FUNCTION PANEL
ALT: 29 U/L (ref 0–35)
AST: 21 U/L (ref 0–37)
Albumin: 4.4 g/dL (ref 3.5–5.2)
Alkaline Phosphatase: 157 U/L — ABNORMAL HIGH (ref 39–117)
Bilirubin, Direct: 0.1 mg/dL (ref 0.0–0.3)
Total Bilirubin: 0.3 mg/dL (ref 0.2–1.2)
Total Protein: 7.5 g/dL (ref 6.0–8.3)

## 2017-07-12 LAB — URINALYSIS, ROUTINE W REFLEX MICROSCOPIC
Bilirubin Urine: NEGATIVE
Ketones, ur: NEGATIVE
Leukocytes, UA: NEGATIVE
Nitrite: NEGATIVE
RBC / HPF: NONE SEEN (ref 0–?)
Specific Gravity, Urine: >=1.03 — AB (ref 1.000–1.030)
Total Protein, Urine: 100 — AB
Urine Glucose: 100 — AB
Urobilinogen, UA: 0.2 (ref 0.0–1.0)
pH: 5.5 (ref 5.0–8.0)

## 2017-07-12 LAB — MICROALBUMIN / CREATININE URINE RATIO
Creatinine,U: 113.3 mg/dL
Microalb Creat Ratio: 91.4 mg/g — ABNORMAL HIGH (ref 0.0–30.0)
Microalb, Ur: 103.6 mg/dL — ABNORMAL HIGH (ref 0.0–1.9)

## 2017-07-12 LAB — TSH: TSH: 1.1 u[IU]/mL (ref 0.35–4.50)

## 2017-07-12 LAB — HEMOGLOBIN A1C: Hgb A1c MFr Bld: 8.8 % — ABNORMAL HIGH (ref 4.6–6.5)

## 2017-07-12 LAB — LDL CHOLESTEROL, DIRECT: Direct LDL: 78 mg/dL

## 2017-07-12 NOTE — Assessment & Plan Note (Signed)

## 2017-07-12 NOTE — Patient Instructions (Addendum)
Please remember to make your eye doctor appt  Please continue all other medications as before, and refills have been done if requested.  Please have the pharmacy call with any other refills you may need.  Please continue your efforts at being more active, low cholesterol diet, and weight control.  You are otherwise up to date with prevention measures today.  Please keep your appointments with your specialists as you may have planned  You will be contacted regarding the referral for: General [facility]urgury for the hernia  Please go to the LAB in the Basement (turn left off the elevator) for the tests to be done today  You will be contacted by phone if any changes need to be made immediately.  Otherwise, you will receive a letter about your results with an explanation, but please check with MyChart first.  Please remember to sign up for MyChart if you have not done so, as this will be important to you in the future with finding out test results, communicating by private email, and scheduling acute appointments online when needed.  Please return in 6 months, or sooner if needed, with Lab testing done 3-5 days before

## 2017-07-12 NOTE — Progress Notes (Signed)
Subjective:    Patient ID: Stacey [name], female    DOB: November 22, 1950, 67 y.o.   MRN: [id number]  HPI  Here for wellness and f/u;  Overall doing ok;  Pt denies Chest pain, worsening SOB, DOE, wheezing, orthopnea, PND, worsening LE edema, palpitations, dizziness or syncope.  Pt denies neurological change such as new headache, facial or extremity weakness.  Pt denies polydipsia, polyuria, or low sugar symptoms. Pt states overall good compliance with treatment and medications, good tolerability, and has been trying to follow appropriate diet.  Pt denies worsening depressive symptoms, suicidal ideation or panic. No fever, night sweats, wt loss, loss of appetite, or other constitutional symptoms.  Pt states good ability with ADL's, has low fall risk, home safety reviewed and adequate, no other significant changes in hearing or vision, and only occasionally active with exercise with gym a few times per wk  Mostly caring fur husbabd with chronic illness/mycosis fungoides, seen at [facility] often, no longer with light box, now on biologic. Plans to see eye doctor soon.  No new complaints or interval change except for more symptomatic right inguinal area hernia without n/v  fever  Tolerating new fenofibrate ok Past Medical History:  Diagnosis Date  . ANGIOEDEMA 03/07/2008   a. with ACE-I  . ANXIETY 09/16/2006  . ASTHMA 08/01/2006  . CAD (coronary artery disease)    a. 01/2010 : Minimal plaque at cardiac catheterization - CFX 20%, EF 70%;  b. 08/2012 Cath: Nl Cors, EF 65%.  . Chronic diastolic CHF (congestive heart failure) ([place]) 04/06/2010   a. In setting of HOCM.  Marland Kitchen Complete heart block ([place]) 02/26/2014   a. s/p MDT dual chamber pacemaker 02/2014  . COPD (chronic obstructive pulmonary disease) ([place])   . DEPRESSION 09/16/2006  . DIVERTICULOSIS, COLON, WITH HEMORRHAGE 04/06/2010  . DM (diabetes mellitus) in pregnancy, delivered w/postpartum condition 08/30/2010  . HYPERLIPIDEMIA 08/01/2006  . HYPERTENSION  08/01/2006  . 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 s/p septal myomectomy 4/12 at [place] with Dr. Evelina Dun;  b. echo 5/12: EF 60-65%, LVOT peak 18 mmHg; grade 1 diast dysfxn, mild SAM (improved since myomectomy;  c. 03/2012 Echo: EF 60-65%, mod-sev basal septal asymm hypertrophy, basal septal HK, LVOT grad 71mHg, Gr 1 DD, , SAM, Mild MR, nl RV, PASP 316mg; 08/2012 Echo: technically difficult, doubt LVOT obstruction, EF 60%, Gr 1 DD, mod-sev dil LA.  . Impaired glucose tolerance 06/25/2010  . LBBB (left bundle branch block) 06/17/2010  . OBESITY 09/16/2006  . 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 SICKLE CELL ANEMIA 08/01/2006  . Type II or unspecified type diabetes mellitus without mention of complication, uncontrolled 08/30/2010  . VENTRAL HERNIA 12/07/2006   Past Surgical History:  Procedure Laterality Date  . ABDOMINAL HYSTERECTOMY  1987  . CARDIAC SURGERY    . COLONOSCOPY N/A 12/26/2016   Procedure: COLONOSCOPY;  Surgeon: JaMilus BanisterMD;  Location: [facility];  Service: Endoscopy;  Laterality: N/A;  . ELECTROPHYSIOLOGY STUDY N/A 09/12/2012   Procedure: ELECTROPHYSIOLOGY STUDY;  Surgeon: StDeboraha SprangMD;  [facility];  Service: Cardiovascular;  Laterality: N/A;  . LEFT HEART CATH  Aug. 18, 2014   Medtronic heart device  . LEFT HEART CATHETERIZATION WITH CORONARY ANGIOGRAM N/A 09/10/2012   Procedure: LEFT HEART CATHETERIZATION WITH CORONARY ANGIOGRAM;  Surgeon: ChBurnell BlanksMD;  [facility];  Service: Cardiovascular;  Laterality: N/A;  . MYOMECTOMY     Septal  . PERMANENT PACEMAKER INSERTION N/A 02/26/2014  MDT Advisa dual chamber pacemaker implanted by Dr Caryl Comes for heart block  . VENTRAL HERNIA REPAIR  06/2006    reports that she quit smoking about 19 years ago. Her smoking use included cigarettes. She has a 5.00 pack-year smoking history. She has never used smokeless tobacco. She reports that she does not drink alcohol or use drugs. family history includes Cancer in  her daughter; Clotting disorder in her daughter and other; Colon cancer in her daughter; Diabetes in her paternal grandmother; Heart attack in her father and sister; Heart disease in her daughter and father; Hyperlipidemia in her brother, daughter, and sister; Hypertension in her brother, father, sister, and son; Stroke in her sister. Allergies  Allergen Reactions  . Dairy Aid [Lactase] Diarrhea and Other (See Comments)    Bloating and gastric distress  . Metformin And Related Other (See Comments)    GI upset  . Ace Inhibitors Other (See Comments)    REACTION: angioedema right eye  . Aspirin Other (See Comments)    Bleeding   . Codeine Other (See Comments)    Hallucinations, can take if with someone.  . Soy Allergy Other (See Comments)    Bleeding & cramps   Current Outpatient Medications on File Prior to Visit  Medication Sig Dispense Refill  . acetaminophen (TYLENOL) 500 MG tablet Take 500 mg by mouth daily as needed.    Marland Kitchen albuterol (PROVENTIL HFA;VENTOLIN HFA) 108 (90 BASE) MCG/ACT inhaler Inhale 2 puffs into the lungs every 6 (six) hours as needed for wheezing or shortness of breath. 18 g 5  . amLODipine (NORVASC) 10 MG tablet TAKE 1 TABLET BY MOUTH EVERY DAY 90 tablet 3  . atorvastatin (LIPITOR) 40 MG tablet TAKE 1 TABLET(40 MG) BY MOUTH DAILY 90 tablet 0  . Blood Glucose Monitoring Suppl (ONE TOUCH ULTRA 2) W/DEVICE KIT Use to check blood sugars daily Dx E11.9 1 each 0  . cetirizine (ZYRTEC) 10 MG tablet Take 10 mg by mouth daily.      . fenofibrate (TRICOR) 145 MG tablet Take 1 tablet (145 mg total) by mouth daily. 90 tablet 3  . glipiZIDE (GLUCOTROL XL) 10 MG 24 hr tablet TAKE 1 TABLET BY MOUTH EVERY DAY WITH BREAKFAST 90 tablet 1  . glucose blood test strip 1 each by Other route daily. Use to check blood sugar daily Dx e11.9 100 each 11  . hydrocortisone (ANUSOL-HC) 2.5 % rectal cream Place rectally 2 (two) times daily. 30 g 1  . irbesartan (AVAPRO) 300 MG tablet TAKE 1  TABLET(300 MG) BY MOUTH AT BEDTIME 90 tablet 0  . meclizine (ANTIVERT) 12.5 MG tablet Take 1 tablet (12.5 mg total) by mouth 3 (three) times daily as needed for dizziness. 40 tablet 0  . metoprolol succinate (TOPROL-XL) 100 MG 24 hr tablet TAKE 1 TABLET BY MOUTH EVERY DAY 90 tablet 1  . ondansetron (ZOFRAN) 4 MG tablet Take 1 tablet (4 mg total) by mouth every 8 (eight) hours as needed for nausea or vomiting. As needed for nausea or vomiting. 30 tablet 1  . ONETOUCH DELICA LANCETS 56L MISC USE TO HELP CHECK BLOOD SUGAR DAILY 200 each 0  . oxcarbazepine (TRILEPTAL) 600 MG tablet Take 600 mg by mouth 2 (two) times daily.    . potassium chloride (K-DUR) 10 MEQ tablet Take 1 tablet (10 mEq total) by mouth 2 (two) times daily. 90 tablet 11  . topiramate (TOPAMAX) 100 MG tablet TAKE 1 TABLET BY MOUTH EVERY DAY 90 tablet 0  No current facility-administered medications on file prior to visit.    Review of Systems Constitutional: Negative for other unusual diaphoresis, sweats, appetite or weight changes HENT: Negative for other worsening hearing loss, ear pain, facial swelling, mouth sores or neck stiffness.   Eyes: Negative for other worsening pain, redness or other visual disturbance.  Respiratory: Negative for other stridor or swelling Cardiovascular: Negative for other palpitations or other chest pain  Gastrointestinal: Negative for worsening diarrhea or loose stools, blood in stool, distention or other pain Genitourinary: Negative for hematuria, flank pain or other change in urine volume.  Musculoskeletal: Negative for myalgias or other joint swelling.  Skin: Negative for other color change, or other wound or worsening drainage.  Neurological: Negative for other syncope or numbness. Hematological: Negative for other adenopathy or swelling Psychiatric/Behavioral: Negative for hallucinations, other worsening agitation, SI, self-injury, or new decreased concentration All other system neg per pt      Objective:   Physical Exam BP (!) 158/96   Pulse 63   Temp 98.1 F (36.7 C) (Oral)   Ht 5' 9"  (1.753 m)   Wt 173 lb (78.5 kg)   SpO2 95%   BMI 25.55 kg/m  VS noted,  Constitutional: Pt is oriented to person, place, and time. Appears well-developed and well-nourished, in no significant distress and comfortable Head: Normocephalic and atraumatic  Eyes: Conjunctivae and EOM are normal. Pupils are equal, round, and reactive to light Right Ear: External ear normal without discharge Left Ear: External ear normal without discharge Nose: Nose without discharge or deformity Mouth/Throat: Oropharynx is without other ulcerations and moist  Neck: Normal range of motion. Neck supple. No JVD present. No tracheal deviation present or significant neck LA or mass Cardiovascular: Normal rate, regular rhythm, normal heart sounds and intact distal pulses Pulmonary/Chest: WOB normal and breath sounds without rales or wheezing  Abdominal: Soft. Bowel sounds are normal. NT. No HSM , + hernia abd wall/inguinal area, minima tender, reducible Musculoskeletal: Normal range of motion. Exhibits no edema Lymphadenopathy: Has no other cervical adenopathy.  Neurological: Pt is alert and oriented to person, place, and time. Pt has normal reflexes. No cranial nerve deficit. Motor grossly intact, Gait intact Skin: Skin is warm and dry. No rash noted or new ulcerations Psychiatric:  Has normal mood and affect. Behavior is normal without agitation No other lab findings Lab Results  Component Value Date   WBC 5.7 01/26/2017   HGB 12.3 01/26/2017   HCT 39.3 01/26/2017   PLT 241 01/26/2017   GLUCOSE 277 (H) 01/10/2017   CHOL 162 01/10/2017   TRIG (H) 01/10/2017    538.0 Triglyceride is over 400; calculations on Lipids are invalid.   HDL 42.90 01/10/2017   LDLDIRECT 74.0 01/10/2017   LDLCALC 79 09/11/2012   ALT 34 01/10/2017   AST 39 (H) 01/10/2017   NA 140 01/10/2017   K 3.8 01/10/2017   CL 105 01/10/2017    CREATININE 0.81 01/10/2017   BUN 19 01/10/2017   CO2 28 01/10/2017   TSH 0.97 01/10/2017   INR 1.35 12/23/2016   HGBA1C 7.7 (H) 01/10/2017   MICROALBUR 37.2 (H) 01/10/2017       Assessment & Plan:

## 2017-07-12 NOTE — Assessment & Plan Note (Signed)
stable overall by history and exam, recent data reviewed with pt, and pt to continue medical treatment as before,  to f/u any worsening symptoms or concerns Lab Results  Component Value Date   HGBA1C 7.7 (H) 01/10/2017

## 2017-07-12 NOTE — Assessment & Plan Note (Signed)
Ok for general surgury referral 

## 2017-07-14 ENCOUNTER — Other Ambulatory Visit: Payer: Self-pay | Admitting: Internal Medicine

## 2017-07-14 ENCOUNTER — Encounter: Payer: Self-pay | Admitting: Internal Medicine

## 2017-07-14 MED ORDER — DULAGLUTIDE 0.75 MG/0.5ML ~~LOC~~ SOAJ: SUBCUTANEOUS | 3 refills | Status: DC

## 2017-07-17 ENCOUNTER — Telehealth: Payer: Self-pay

## 2017-07-17 NOTE — Telephone Encounter (Signed)
Pt has been informed and expressed understanding.  

## 2017-07-17 NOTE — Telephone Encounter (Signed)
-----   Message from Corwin LevinsJames W John, MD sent at 07/14/2017  8:04 AM EDT ----- Letter sent, cont same tx except  The test results show that your current treatment is OK, except the blood sugar overall (the A1c) is too high.  Since the goal is to be less than 7, you are taking the higher dose of glucotrol, and you cannot take the metformin, we need to add a different medication called Trulicity (or one that is similar).  This is a once weekly injection that is easy to do.  I will send a prescription, and you should hear from the office as well.  Please have the pharmacy call us with the name of a similar medication if the Trulicity is not well covered with your insurance.    Shirron to please inform pt, I will do rx

## 2017-07-24 ENCOUNTER — Ambulatory Visit
Admission: RE | Admit: 2017-07-24 | Discharge: 2017-07-24 | Disposition: A | Discharge status: Home / Self Care | Payer: PPO | Source: Ambulatory Visit | Attending: Internal Medicine | Admitting: Internal Medicine

## 2017-07-24 DIAGNOSIS — E2839 Other primary ovarian failure: Secondary | ICD-10-CM

## 2017-07-30 ENCOUNTER — Other Ambulatory Visit: Payer: Self-pay | Admitting: Internal Medicine

## 2017-08-06 ENCOUNTER — Other Ambulatory Visit: Payer: Self-pay | Admitting: Internal Medicine

## 2017-08-06 DIAGNOSIS — H8110 Benign paroxysmal vertigo, unspecified ear: Secondary | ICD-10-CM

## 2017-08-08 ENCOUNTER — Other Ambulatory Visit: Payer: PPO

## 2017-08-08 ENCOUNTER — Other Ambulatory Visit: Payer: Self-pay

## 2017-08-08 DIAGNOSIS — H8110 Benign paroxysmal vertigo, unspecified ear: Secondary | ICD-10-CM

## 2017-08-08 MED ORDER — MECLIZINE HCL 12.5 MG PO TABS: 12.5000 mg | ORAL_TABLET | Freq: Three times a day (TID) | ORAL | 2 refills | Status: DC | PRN

## 2017-08-08 MED ORDER — ATORVASTATIN CALCIUM 40 MG PO TABS: ORAL_TABLET | ORAL | 3 refills | Status: DC

## 2017-08-10 ENCOUNTER — Ambulatory Visit
Admission: RE | Admit: 2017-08-10 | Discharge: 2017-08-10 | Disposition: A | Discharge status: Home / Self Care | Payer: PPO | Source: Ambulatory Visit | Attending: Internal Medicine | Admitting: Internal Medicine

## 2017-08-10 DIAGNOSIS — Z780 Asymptomatic menopausal state: Secondary | ICD-10-CM | POA: Diagnosis not present

## 2017-08-10 DIAGNOSIS — M85851 Other specified disorders of bone density and structure, right thigh: Secondary | ICD-10-CM | POA: Diagnosis not present

## 2017-08-14 DIAGNOSIS — K439 Ventral hernia without obstruction or gangrene: Secondary | ICD-10-CM | POA: Diagnosis not present

## 2017-08-15 ENCOUNTER — Encounter: Payer: Self-pay | Admitting: Internal Medicine

## 2017-08-15 ENCOUNTER — Other Ambulatory Visit: Payer: Self-pay | Admitting: Internal Medicine

## 2017-08-15 MED ORDER — ALENDRONATE SODIUM 70 MG PO TABS: 70.0000 mg | ORAL_TABLET | ORAL | 3 refills | Status: DC

## 2017-08-16 ENCOUNTER — Telehealth: Payer: Self-pay

## 2017-08-16 NOTE — Telephone Encounter (Signed)
-----   Message from Corwin LevinsJames W John, MD sent at 08/15/2017  3:23 PM EDT ----- Letter sent, cont same tx except  The test results show that your current treatment is OK, except although you do not have osteoporosis, you do have bone loss called Osteopenia to the level that is often treated with fosamax 70 mg once per wk, to avoid future worsening and higher risk of bone fractures.  I will send the prescription, and you should be contacted by the office as well.    Bethenny Losee to please inform pt, I will do rx

## 2017-08-16 NOTE — Telephone Encounter (Signed)
Pt has been informed and expressed understanding.  

## 2017-08-21 ENCOUNTER — Ambulatory Visit (INDEPENDENT_AMBULATORY_CARE_PROVIDER_SITE_OTHER): Payer: PPO | Admitting: *Deleted

## 2017-08-21 DIAGNOSIS — I442 Atrioventricular block, complete: Secondary | ICD-10-CM

## 2017-08-21 NOTE — Progress Notes (Signed)
Remote pacemaker transmission.   

## 2017-08-22 ENCOUNTER — Encounter: Payer: Self-pay | Admitting: Cardiology

## 2017-08-22 NOTE — Progress Notes (Signed)
Letter  

## 2017-08-25 LAB — CUP PACEART REMOTE DEVICE CHECK
Battery Remaining Longevity: 84 mo
Battery Voltage: 3.02 V
Brady Statistic AP VP Percent: 25.05 %
Brady Statistic AP VS Percent: 0.36 %
Brady Statistic AS VP Percent: 56.93 %
Brady Statistic AS VS Percent: 17.66 %
Brady Statistic RA Percent Paced: 25.37 %
Brady Statistic RV Percent Paced: 81.88 %
Date Time Interrogation Session: 20190729135127
Implantable Lead Implant Date: 20160203
Implantable Lead Implant Date: 20160203
Implantable Lead Location: 753859
Implantable Lead Location: 753860
Implantable Lead Model: 5076
Implantable Lead Model: 5076
Implantable Pulse Generator Implant Date: 20160203
Lead Channel Impedance Value: 323 Ohm
Lead Channel Impedance Value: 456 Ohm
Lead Channel Impedance Value: 475 Ohm
Lead Channel Impedance Value: 589 Ohm
Lead Channel Pacing Threshold Amplitude: 0.625 V
Lead Channel Pacing Threshold Amplitude: 0.875 V
Lead Channel Pacing Threshold Pulse Width: 0.4 ms
Lead Channel Pacing Threshold Pulse Width: 0.4 ms
Lead Channel Sensing Intrinsic Amplitude: 19.875 mV
Lead Channel Sensing Intrinsic Amplitude: 19.875 mV
Lead Channel Sensing Intrinsic Amplitude: 2.5 mV
Lead Channel Sensing Intrinsic Amplitude: 2.5 mV
Lead Channel Setting Pacing Amplitude: 2 V
Lead Channel Setting Pacing Amplitude: 2.5 V
Lead Channel Setting Pacing Pulse Width: 0.4 ms
Lead Channel Setting Sensing Sensitivity: 2.8 mV

## 2017-08-29 ENCOUNTER — Other Ambulatory Visit: Payer: Self-pay | Admitting: Internal Medicine

## 2017-09-11 DIAGNOSIS — H26493 Other secondary cataract, bilateral: Secondary | ICD-10-CM | POA: Diagnosis not present

## 2017-09-11 DIAGNOSIS — H401134 Primary open-angle glaucoma, bilateral, indeterminate stage: Secondary | ICD-10-CM | POA: Diagnosis not present

## 2017-09-11 DIAGNOSIS — E119 Type 2 diabetes mellitus without complications: Secondary | ICD-10-CM | POA: Diagnosis not present

## 2017-09-11 DIAGNOSIS — Z961 Presence of intraocular lens: Secondary | ICD-10-CM | POA: Diagnosis not present

## 2017-09-11 DIAGNOSIS — H53042 Amblyopia suspect, left eye: Secondary | ICD-10-CM | POA: Diagnosis not present

## 2017-09-13 ENCOUNTER — Ambulatory Visit: Payer: Self-pay | Admitting: *Deleted

## 2017-09-13 NOTE — Telephone Encounter (Signed)
Hard to say for sure, but I suppose could be related.  There are many other causes for this.  Ok to hold off on taking the fosamax for now to see if these symptoms resolve over the next 2 wks or so, and let us know if this is not the case

## 2017-09-13 NOTE — Telephone Encounter (Signed)
Pt has been informed and expressed understanding.  

## 2017-09-13 NOTE — Telephone Encounter (Signed)
Patient called in and says "every since I started the Fosamax, I've been having a headache and nausea. The headache started after 2 doses and the nausea after 4 doses. This is the only new medication that I started, so I think it is this." I advised I will send this over to Dr. Jonny RuizJohn and someone will call with his recommendation, she verbalized understanding. Her CB [phone].  Reason for Disposition . Caller has NON-URGENT medication question about med that PCP prescribed and triager unable to answer question  Answer Assessment - Initial Assessment Questions 1. SYMPTOMS: "Do you have any symptoms?"     Yes 2. SEVERITY: If symptoms are present, ask "Are they mild, moderate or severe?"     Moderate to severe  Protocols used: MEDICATION QUESTION CALL-A-AH

## 2017-09-15 ENCOUNTER — Other Ambulatory Visit: Payer: Self-pay | Admitting: Pharmacist

## 2017-09-15 NOTE — Patient Outreach (Signed)
[facility] Care Management  09/15/2017  Stacey [name] 06/07/1950 [id number]   Incoming call from Stacey Ermhristine O [name] in response to the EMMI Medication Adherence Campaign. Speak with patient. HIPAA identifiers verified and verbal consent received.  [name] reports that she takes her irbesartan 300 mg once daily as directed. Denies any missed doses or barriers to adherence. Reports that she also checks her blood pressure regularly, last reading 126/70. Counsel patient on the importance of medication adherence and tools for adherence. Patient verbalizes understanding and states that her system works well for her.  Patient reports that her PCP recently had her hold her alendronate for now to see if this is the cause of her headaches. Reports that the headaches have resolved with stopping this medication. Reports that she will follow up directly with her PCP to let him know.  Reports that she was recently started on latanoprost eye drops for elevated eye pressure. Add this medication to patient's Epic medication list accordingly.  [name] denies any further medication questions/concerns at this time.  Will close pharmacy episode.  Duanne MoronElisabeth Maddux Vanscyoc, PharmD, [facility]BCACP Clinical Pharmacist [facility] Care Management [phone]

## 2017-09-21 ENCOUNTER — Other Ambulatory Visit: Payer: Self-pay | Admitting: Internal Medicine

## 2017-10-09 ENCOUNTER — Other Ambulatory Visit: Payer: Self-pay | Admitting: Internal Medicine

## 2017-10-27 ENCOUNTER — Ambulatory Visit (INDEPENDENT_AMBULATORY_CARE_PROVIDER_SITE_OTHER): Payer: PPO

## 2017-10-27 DIAGNOSIS — Z23 Encounter for immunization: Secondary | ICD-10-CM

## 2017-11-06 ENCOUNTER — Telehealth: Payer: Self-pay | Admitting: Internal Medicine

## 2017-11-06 MED ORDER — LIRAGLUTIDE 18 MG/3ML ~~LOC~~ SOPN: 1.8000 mg | PEN_INJECTOR | Freq: Every day | SUBCUTANEOUS | 3 refills | Status: DC

## 2017-11-06 NOTE — Telephone Encounter (Signed)
Ok for change trulicity to victoza daily, but have the pharmacy call with a similar medication alternative covered by her insurance, if this is not affordable as well

## 2017-11-06 NOTE — Telephone Encounter (Signed)
Copied from CRM [id number]. Topic: Quick Communication - See Telephone Encounter >> Nov 06, 2017  3:55 PM Lorrine Kin, [place] wrote: CRM for notification. See Telephone encounter for: 11/06/17. Patient calling and states that she can not afford the Dulaglutide (TRULICITY) 0.75 MG/0.5ML SOPN.. ($198.88 for a month supply). Please advise. Would like a cheaper medication. Out of trulicity.  CB#: [phone] [id number] - [place], [place] - [address] AT [facility] OF [facility] [address]

## 2017-11-06 NOTE — Addendum Note (Signed)
Addended by: [name] on: 11/06/2017 08:25 PM   Modules accepted: Orders

## 2017-11-07 NOTE — Telephone Encounter (Signed)
Called pt, LVM with details given below.

## 2017-11-08 MED ORDER — DULAGLUTIDE 0.75 MG/0.5ML ~~LOC~~ SOAJ: 0.5000 mL | SUBCUTANEOUS | 3 refills | Status: DC

## 2017-11-08 NOTE — Telephone Encounter (Signed)
Pt states that she spoke with her insurance company and was told that they do cover the trulicity 0.75mg . They do not cover Victoza.

## 2017-11-08 NOTE — Telephone Encounter (Signed)
Ok to hold on the [employer] for [employer] - new rx done erx

## 2017-11-08 NOTE — Addendum Note (Signed)
Addended by: [name] on: 11/08/2017 12:06 PM   Modules accepted: Orders

## 2017-11-08 NOTE — Telephone Encounter (Signed)
Pt will check with her pharm to see which similar medication is covered by her insurance since pt can not afford trulicity

## 2017-11-09 ENCOUNTER — Ambulatory Visit: Payer: PPO | Admitting: Nurse Practitioner

## 2017-11-09 ENCOUNTER — Other Ambulatory Visit (INDEPENDENT_AMBULATORY_CARE_PROVIDER_SITE_OTHER): Payer: PPO

## 2017-11-09 ENCOUNTER — Encounter: Payer: Self-pay | Admitting: Nurse Practitioner

## 2017-11-09 ENCOUNTER — Other Ambulatory Visit: Payer: Self-pay

## 2017-11-09 VITALS — BP 128/88 | HR 90 | Ht 69.0 in | Wt 175.0 lb

## 2017-11-09 DIAGNOSIS — K625 Hemorrhage of anus and rectum: Secondary | ICD-10-CM

## 2017-11-09 DIAGNOSIS — K921 Melena: Secondary | ICD-10-CM

## 2017-11-09 LAB — CBC
HCT: 40 % (ref 36.0–46.0)
Hemoglobin: 13 g/dL (ref 12.0–15.0)
MCHC: 32.4 g/dL (ref 30.0–36.0)
MCV: 76 fl — ABNORMAL LOW (ref 78.0–100.0)
Platelets: 216 10*3/uL (ref 150.0–400.0)
RBC: 5.27 Mil/uL — ABNORMAL HIGH (ref 3.87–5.11)
RDW: 15.5 % (ref 11.5–15.5)
WBC: 6 10*3/uL (ref 4.0–10.5)

## 2017-11-09 LAB — BASIC METABOLIC PANEL
BUN: 15 mg/dL (ref 6–23)
CO2: 25 mEq/L (ref 19–32)
Calcium: 9.5 mg/dL (ref 8.4–10.5)
Chloride: 106 mEq/L (ref 96–112)
Creatinine, Ser: 0.83 mg/dL (ref 0.40–1.20)
GFR: 88.38 mL/min (ref >60.00)
Glucose, Bld: 169 mg/dL — ABNORMAL HIGH (ref 70–99)
Potassium: 4.5 mEq/L (ref 3.5–5.1)
Sodium: 140 mEq/L (ref 135–145)

## 2017-11-09 NOTE — Patient Instructions (Signed)
If you are age 66 or older, your body mass index should be between 23-30. Your Body mass index is 25.84 kg/m. If this is out of the aforementioned range listed, please consider follow up with your Primary Care Provider.  If you are age 31 or younger, your body mass index should be between 19-25. Your Body mass index is 25.84 kg/m. If this is out of the aformentioned range listed, please consider follow up with your Primary Care Provider.   Continue hemorrhoid cream for 7 more day.   It was a pleasure to see you today!  Stacey [name]

## 2017-11-09 NOTE — Progress Notes (Signed)
IMPRESSION and PLAN:    67 year old female with history of diverticular bleed 2018, here with recent onset of rectal bleeding.  Bleeding subsided with steroid cream a few days ago.  Stools are soft, brown with possible reddish tent and Hemoccult-positive.  It is not clear yet if patient is in the early stages of a diverticular bleed. -CBC now.  I will call her with the results.  Certainly if there is more obvious bleeding again patient will call our office immediately or go to the ED -Until this can sorted out I did ask her to continue the steroid cream for ? hemorrhoids for the next week      HPI:     Patient is a 67 year old female with medical problems not limited to DM 2, CAD, chronic diastolic CHF, pacemaker, and COPD, known to Dr. Carlean Purl.  She has a history of severe diverticulosis, diverticular bleed last November, possible mild diverticulitis in 2015 and [facility].  Last colonoscopy was October 2015 with findings of severe diverticulosis in the right and left colon.  She is due for follow-up colonoscopy October of next year .   Chief complaint:  patient comes in today because last week she started seeing blood in her stool.  She thought it was hemorrhoids, started steroid cream and the bleeding has significantly slowed down but says stool is reddish brown. She has also developed some nausea and lower abdominal bloating/discomfort Patient is concerned she may be having a another diverticular bleed.  The onset of bleeding her bowels were at baseline which she says are basically after meals.  Constipation or straining.  No diarrhea. Otherwise patient feels okay.  No use of blood thinners nor NSAIDs   Review of systems:     No chest pain, no SOB, no fevers, no urinary sx   Past Medical History:  Diagnosis Date  . ANGIOEDEMA 03/07/2008   a. with ACE-I  . ANXIETY 09/16/2006  . ASTHMA 08/01/2006  . CAD (coronary artery disease)    a. 01/2010 : Minimal plaque at cardiac  catheterization - CFX 20%, EF 70%;  b. 08/2012 Cath: Nl Cors, EF 65%.  . Chronic diastolic CHF (congestive heart failure) ([place]) 04/06/2010   a. In setting of HOCM.  Marland Kitchen Complete heart block ([place]) 02/26/2014   a. s/p MDT dual chamber pacemaker 02/2014  . COPD (chronic obstructive pulmonary disease) ([place])   . DEPRESSION 09/16/2006  . DIVERTICULOSIS, COLON, WITH HEMORRHAGE 04/06/2010  . DM (diabetes mellitus) in pregnancy, delivered w/postpartum condition 08/30/2010  . HYPERLIPIDEMIA 08/01/2006  . HYPERTENSION 08/01/2006  . 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 s/p septal myomectomy 4/12 at [place] with Dr. Evelina Dun;  b. echo 5/12: EF 60-65%, LVOT peak 18 mmHg; grade 1 diast dysfxn, mild SAM (improved since myomectomy;  c. 03/2012 Echo: EF 60-65%, mod-sev basal septal asymm hypertrophy, basal septal HK, LVOT grad 26mHg, Gr 1 DD, , SAM, Mild MR, nl RV, PASP 315mg; 08/2012 Echo: technically difficult, doubt LVOT obstruction, EF 60%, Gr 1 DD, mod-sev dil LA.  . Impaired glucose tolerance 06/25/2010  . LBBB (left bundle branch block) 06/17/2010  . OBESITY 09/16/2006  . [place] artery [place] ([place]  . SICKLE CELL ANEMIA 08/01/2006  . Type II or unspecified type diabetes mellitus without mention of complication, uncontrolled 08/30/2010  . VENTRAL HERNIA 12/07/2006    Patient's surgical history, family medical history, social history, medications and allergies were all reviewed in Epic   Creatinine clearance  cannot be calculated (Patient's most recent lab result is older than the maximum 21 days allowed.)  Current Outpatient Medications  Medication Sig Dispense Refill  . acetaminophen (TYLENOL) 500 MG tablet Take 500 mg by mouth daily as needed.    Marland Kitchen albuterol (PROVENTIL HFA;VENTOLIN HFA) 108 (90 BASE) MCG/ACT inhaler Inhale 2 puffs into the lungs every 6 (six) hours as needed for wheezing or shortness of breath. 18 g 5  . amLODipine (NORVASC) 10 MG tablet TAKE 1 TABLET BY MOUTH EVERY DAY 90 tablet 3  . atorvastatin  (LIPITOR) 40 MG tablet TAKE 1 TABLET(40 MG) BY MOUTH DAILY 90 tablet 3  . Blood Glucose Monitoring Suppl (ONE TOUCH ULTRA 2) W/DEVICE KIT Use to check blood sugars daily Dx E11.9 1 each 0  . cetirizine (ZYRTEC) 10 MG tablet Take 10 mg by mouth daily.      . Dulaglutide (TRULICITY) 6.71 IW/5.8KD SOPN Inject 0.5 mLs into the skin once a week. 6 mL 3  . fenofibrate (TRICOR) 145 MG tablet Take 1 tablet (145 mg total) by mouth daily. 90 tablet 3  . glipiZIDE (GLUCOTROL XL) 10 MG 24 hr tablet TAKE 1 TABLET BY MOUTH EVERY DAY WITH BREAKFAST 90 tablet 1  . glucose blood test strip 1 each by Other route daily. Use to check blood sugar daily Dx e11.9 100 each 11  . hydrocortisone (ANUSOL-HC) 2.5 % rectal cream Place rectally 2 (two) times daily. 30 g 1  . irbesartan (AVAPRO) 300 MG tablet TAKE 1 TABLET BY MOUTH AT BEDTIME 90 tablet 0  . latanoprost (XALATAN) 0.005 % ophthalmic solution Place 1 drop into both eyes at bedtime.  3  . meclizine (ANTIVERT) 12.5 MG tablet Take 1 tablet (12.5 mg total) by mouth 3 (three) times daily as needed for dizziness. 40 tablet 2  . metoprolol succinate (TOPROL-XL) 100 MG 24 hr tablet TAKE 1 TABLET BY MOUTH EVERY DAY 90 tablet 3  . ondansetron (ZOFRAN) 4 MG tablet TAKE 1 TABLET(4 MG) BY MOUTH EVERY 8 HOURS AS NEEDED FOR NAUSEA OR VOMITING 30 tablet 0  . ONETOUCH DELICA LANCETS 98P MISC USE TO HELP CHECK BLOOD SUGAR DAILY 200 each 0  . oxcarbazepine (TRILEPTAL) 600 MG tablet Take 600 mg by mouth 2 (two) times daily.    . potassium chloride (K-DUR,KLOR-CON) 10 MEQ tablet TAKE 2 TABLETS BY MOUTH DAILY 180 tablet 0  . topiramate (TOPAMAX) 100 MG tablet TAKE 1 TABLET BY MOUTH EVERY DAY 90 tablet 0   No current facility-administered medications for this visit.     Physical Exam:     BP 128/88   Pulse 90   Ht '5\' 9"'$  (1.753 m)   Wt 175 lb (79.4 kg)   BMI 25.84 kg/m   GENERAL:  Pleasant female in NAD PSYCH: : Cooperative, normal affect EENT:  conjunctiva pink, mucous  membranes moist, neck supple without masses CARDIAC:  RRR,  no peripheral edema PULM: Normal respiratory effort, lungs CTA bilaterally, no wheezing ABDOMEN:  Nondistended, soft, nontender. No obvious masses, no hepatomegaly,  normal bowel sounds RECTAL: A few hemorrhoidal tags.  Anoscopy attempted but large amount of soft stool in the anal canal precluded exam.  Stool was brown, possibly with a slight reddish tent??.  It was Hemoccult positive SKIN:  turgor, no lesions seen Musculoskeletal:  Normal muscle tone, normal strength NEURO: Alert and oriented x 3, no focal neurologic deficits   Tye Savoy , NP 11/09/2017, 8:30 AM

## 2017-11-10 ENCOUNTER — Other Ambulatory Visit (INDEPENDENT_AMBULATORY_CARE_PROVIDER_SITE_OTHER): Payer: PPO

## 2017-11-10 DIAGNOSIS — K921 Melena: Secondary | ICD-10-CM | POA: Diagnosis not present

## 2017-11-10 DIAGNOSIS — K625 Hemorrhage of anus and rectum: Secondary | ICD-10-CM

## 2017-11-10 LAB — HEMOGLOBIN: Hemoglobin: 12.8 g/dL (ref 12.0–15.0)

## 2017-11-10 LAB — HEMATOCRIT: HCT: 39.7 % (ref 36.0–46.0)

## 2017-11-13 ENCOUNTER — Telehealth: Payer: Self-pay | Admitting: Nurse Practitioner

## 2017-11-13 NOTE — Telephone Encounter (Signed)
Pt called to inform that if we get her blood results today pls call her to this number [phone]. Only for today.

## 2017-11-13 NOTE — Telephone Encounter (Signed)
Advised the results have not been reviewed by her provider at this time.

## 2017-11-20 ENCOUNTER — Ambulatory Visit (INDEPENDENT_AMBULATORY_CARE_PROVIDER_SITE_OTHER): Payer: PPO | Admitting: *Deleted

## 2017-11-20 ENCOUNTER — Telehealth: Payer: Self-pay | Admitting: Cardiology

## 2017-11-20 DIAGNOSIS — I442 Atrioventricular block, complete: Secondary | ICD-10-CM | POA: Diagnosis not present

## 2017-11-20 NOTE — Telephone Encounter (Signed)
LMOVM reminding pt to send remote transmission.   

## 2017-11-21 NOTE — Progress Notes (Signed)
Remote pacemaker transmission.   

## 2017-11-22 NOTE — Progress Notes (Signed)
Electrophysiology Office Note Date: 11/24/2017  ID:  Stacey [name], DOB [birth year], MRN [id number]  PCP: Biagio Borg, MD Electrophysiologist: Caryl Comes  CC: Pacemaker follow-up  Stacey Anstett Crawford-Fewell is a 67 y.o. female seen today for Dr Caryl Comes.  She presents today for routine electrophysiology followup.  Since last being seen in our clinic, the patient reports doing very well. She is still under significant stress with caring for her husband. She has renewed her gym membership and is hoping to start exercising more. She does have some dependent LE edema as the day goes on.  She denies chest pain, palpitations, dyspnea, PND, orthopnea, nausea, vomiting, dizziness, syncope, weight gain, or early satiety.  Device History: MDT dual chamber PPM implanted 2016 for complete heart block; HCM   Past Medical History:  Diagnosis Date  . ANGIOEDEMA 03/07/2008   a. with ACE-I  . ANXIETY 09/16/2006  . ASTHMA 08/01/2006  . CAD (coronary artery disease)    a. 01/2010 : Minimal plaque at cardiac catheterization - CFX 20%, EF 70%;  b. 08/2012 Cath: Nl Cors, EF 65%.  . Chronic diastolic CHF (congestive heart failure) ([place]) 04/06/2010   a. In setting of HOCM.  Marland Kitchen Complete heart block ([place]) 02/26/2014   a. s/p MDT dual chamber pacemaker 02/2014  . COPD (chronic obstructive pulmonary disease) ([place])   . DEPRESSION 09/16/2006  . DIVERTICULOSIS, COLON, WITH HEMORRHAGE 04/06/2010  . DM (diabetes mellitus) in pregnancy, delivered w/postpartum condition 08/30/2010  . HYPERLIPIDEMIA 08/01/2006  . HYPERTENSION 08/01/2006  . 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 s/p septal myomectomy 4/12 at [place] with Dr. Evelina Dun;  b. echo 5/12: EF 60-65%, LVOT peak 18 mmHg; grade 1 diast dysfxn, mild SAM (improved since myomectomy;  c. 03/2012 Echo: EF 60-65%, mod-sev basal septal asymm hypertrophy, basal septal HK, LVOT grad 18mHg, Gr 1 DD, , SAM, Mild MR, nl RV, PASP 33mg; 08/2012 Echo: technically difficult, doubt LVOT  obstruction, EF 60%, Gr 1 DD, mod-sev dil LA.  . Impaired glucose tolerance 06/25/2010  . LBBB (left bundle branch block) 06/17/2010  . OBESITY 09/16/2006  . Renal artery aneurysm ([facility]  . SICKLE CELL ANEMIA 08/01/2006  . Type II or unspecified type diabetes mellitus without mention of complication, uncontrolled 08/30/2010  . VENTRAL HERNIA 12/07/2006   Past Surgical History:  Procedure Laterality Date  . ABDOMINAL HYSTERECTOMY  1987  . CARDIAC SURGERY    . COLONOSCOPY N/A 12/26/2016   Procedure: COLONOSCOPY;  Surgeon: JaMilus BanisterMD;  [facility]: [facility];  Service: Endoscopy;  Laterality: N/A;  . ELECTROPHYSIOLOGY STUDY N/A 09/12/2012   Procedure: ELECTROPHYSIOLOGY STUDY;  Surgeon: StDeboraha SprangMD;  [facility];  Service: Cardiovascular;  Laterality: N/A;  . LEFT HEART CATH  Aug. 18, 2014   Medtronic heart device  . LEFT HEART CATHETERIZATION WITH CORONARY ANGIOGRAM N/A 09/10/2012   Procedure: LEFT HEART CATHETERIZATION WITH CORONARY ANGIOGRAM;  Surgeon: ChBurnell BlanksMD;  [facility];  Service: Cardiovascular;  Laterality: N/A;  . MYOMECTOMY     Septal  . PERMANENT PACEMAKER INSERTION N/A 02/26/2014   MDT Advisa dual chamber pacemaker implanted by Dr KlCaryl Comesor heart block  . VENTRAL HERNIA REPAIR  06/2006    Current Outpatient Medications  Medication Sig Dispense Refill  . acetaminophen (TYLENOL) 500 MG tablet Take 500 mg by mouth daily as needed.    . Marland Kitchenlbuterol (PROVENTIL HFA;VENTOLIN HFA) 108 (90 BASE) MCG/ACT inhaler Inhale 2 puffs into the lungs every 6 (six)  hours as needed for wheezing or shortness of breath. 18 g 5  . amLODipine (NORVASC) 10 MG tablet TAKE 1 TABLET BY MOUTH EVERY DAY 90 tablet 3  . atorvastatin (LIPITOR) 40 MG tablet TAKE 1 TABLET(40 MG) BY MOUTH DAILY 90 tablet 3  . Blood Glucose Monitoring Suppl (ONE TOUCH ULTRA 2) W/DEVICE KIT Use to check blood sugars daily Dx E11.9 1 each 0  . cetirizine (ZYRTEC) 10 MG tablet Take 10 mg  by mouth daily.      . fenofibrate (TRICOR) 145 MG tablet Take 1 tablet (145 mg total) by mouth daily. 90 tablet 3  . glipiZIDE (GLUCOTROL XL) 10 MG 24 hr tablet TAKE 1 TABLET BY MOUTH EVERY DAY WITH BREAKFAST 90 tablet 1  . glucose blood test strip 1 each by Other route daily. Use to check blood sugar daily Dx e11.9 100 each 11  . hydrocortisone (ANUSOL-HC) 2.5 % rectal cream Place rectally 2 (two) times daily. 30 g 1  . irbesartan (AVAPRO) 300 MG tablet TAKE 1 TABLET BY MOUTH AT BEDTIME 90 tablet 0  . latanoprost (XALATAN) 0.005 % ophthalmic solution Place 1 drop into both eyes at bedtime.  3  . meclizine (ANTIVERT) 12.5 MG tablet Take 1 tablet (12.5 mg total) by mouth 3 (three) times daily as needed for dizziness. 40 tablet 2  . metoprolol succinate (TOPROL-XL) 100 MG 24 hr tablet TAKE 1 TABLET BY MOUTH EVERY DAY 90 tablet 3  . ondansetron (ZOFRAN) 4 MG tablet TAKE 1 TABLET(4 MG) BY MOUTH EVERY 8 HOURS AS NEEDED FOR NAUSEA OR VOMITING 30 tablet 0  . ONETOUCH DELICA LANCETS 99991111 MISC USE TO HELP CHECK BLOOD SUGAR DAILY 200 each 0  . oxcarbazepine (TRILEPTAL) 600 MG tablet Take 600 mg by mouth 2 (two) times daily.    . potassium chloride (K-DUR,KLOR-CON) 10 MEQ tablet TAKE 2 TABLETS BY MOUTH DAILY 180 tablet 0  . topiramate (TOPAMAX) 100 MG tablet TAKE 1 TABLET BY MOUTH EVERY DAY 90 tablet 0   No current facility-administered medications for this visit.     Allergies:   Dairy aid [lactase]; Metformin and related; Ace inhibitors; Aspirin; Codeine; and Soy allergy   Social History: Social History   Socioeconomic History  . Marital status: Married    Spouse name: Not on file  . Number of children: 3  . Years of education: Not on file  . Highest education level: Not on file  Occupational History  . Occupation: retired Product manager: [employer]  . Financial resource strain: Hard  . Food insecurity:    Worry: Sometimes true    Inability: Sometimes true  .  Transportation needs:    Medical: No    Non-medical: No  Tobacco Use  . Smoking status: Former Smoker    Packs/day: 0.25    Years: 20.00    Pack years: 5.00    Types: Cigarettes    Last attempt to quit: 01/24/1998    Years since quitting: 19.8  . Smokeless tobacco: Never Used  . Tobacco comment: Quit smoking 2001. Smoked on and off for 20 years. Smoked 2-3 cigars daily  Substance and Sexual Activity  . Alcohol use: No    Alcohol/week: 0.0 standard drinks  . Drug use: No  . Sexual activity: Not Currently  Lifestyle  . Physical activity:    Days per week: 0 days    Minutes per session: 0 min  . Stress: Very much  Relationships  .  Social connections:    Talks on phone: Once a week    Gets together: Once a week    Attends religious service: Never    Active member of club or organization: No    Attends meetings of clubs or organizations: Never    Relationship status: Married  . Intimate partner violence:    Fear of current or ex partner: No    Emotionally abused: Yes    Physically abused: No    Forced sexual activity: No  Other Topics Concern  . Not on file  Social History Narrative   Lives in [place] with husband.  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    Family History: Family History  Problem Relation Age of Onset  . Heart disease Father   . Heart attack Father   . Hypertension Father        before age 5  . Colon cancer Daughter        and bleeding disorder  . Hyperlipidemia Daughter   . Heart disease Daughter   . Cancer Daughter   . Heart attack Sister   . Hyperlipidemia Sister   . Stroke Sister   . Hypertension Sister   . Hyperlipidemia Brother   . Hypertension Brother   . Diabetes Paternal Grandmother   . Clotting disorder Daughter   . Clotting disorder Other        granddaughter  . Hypertension Son      Review of Systems: All other systems reviewed and are otherwise negative except as noted above.   Physical Exam: VS:  BP  (!) 146/96   Pulse 72   Ht 5' 9"$  (1.753 m)   Wt 178 lb 3.2 oz (80.8 kg)   SpO2 96%   BMI 26.32 kg/m  , BMI Body mass index is 26.32 kg/m.  GEN- The patient is well appearing, alert and oriented x 3 today.   HEENT: normocephalic, atraumatic; sclera clear, conjunctiva pink; hearing intact; oropharynx clear; neck supple  Lungs- Clear to ausculation bilaterally, normal work of breathing.  No wheezes, rales, rhonchi Heart- Regular rate and rhythm (paced) GI- soft, non-tender, non-distended, bowel sounds present  Extremities- no clubbing, cyanosis, or edema  MS- no significant deformity or atrophy Skin- warm and dry, no rash or lesion; PPM pocket well healed Psych- euthymic mood, full affect Neuro- strength and sensation are intact  PPM Interrogation- reviewed in detail today,  See PACEART report  EKG:  EKG is not ordered today.  Recent Labs: 07/12/2017: ALT 29; TSH 1.10 11/09/2017: BUN 15; Creatinine, Ser 0.83; Platelets 216.0; Potassium 4.5; Sodium 140 11/10/2017: Hemoglobin 12.8   Wt Readings from Last 3 Encounters:  11/24/17 178 lb 3.2 oz (80.8 kg)  11/09/17 175 lb (79.4 kg)  07/12/17 173 lb (78.5 kg)     Assessment and Plan:  1.  Complete heart block Normal PPM function See Pace Art report No changes today  2.  HCM s/p myectomy Stable No change required today  3.  HTN BP elevated in clinic today At home, readings are normal Will follow    Current medicines are reviewed at length with the patient today.   The patient does not have concerns regarding her medicines.  The following changes were made today:  none  Labs/ tests ordered today include: none Orders Placed This Encounter  Procedures  . CUP PACEART INCLINIC DEVICE CHECK     Disposition:   Follow up with [facility], Dr Caryl Comes 1 year  Signed, Chanetta Marshall, NP 11/24/2017 11:39 AM  [facility] [address] [place] [address] [phone] (office) [phone]  (fax)

## 2017-11-24 ENCOUNTER — Ambulatory Visit: Payer: PPO | Admitting: Nurse Practitioner

## 2017-11-24 ENCOUNTER — Encounter: Payer: Self-pay | Admitting: Nurse Practitioner

## 2017-11-24 VITALS — BP 146/96 | HR 72 | Ht 69.0 in | Wt 178.2 lb

## 2017-11-24 DIAGNOSIS — I421 Obstructive hypertrophic cardiomyopathy: Secondary | ICD-10-CM | POA: Diagnosis not present

## 2017-11-24 DIAGNOSIS — I10 Essential (primary) hypertension: Secondary | ICD-10-CM

## 2017-11-24 DIAGNOSIS — I442 Atrioventricular block, complete: Secondary | ICD-10-CM

## 2017-11-24 LAB — CUP PACEART INCLINIC DEVICE CHECK
Date Time Interrogation Session: 20191101105453
Implantable Lead Implant Date: 20160203
Implantable Lead Implant Date: 20160203
Implantable Lead Location: 753859
Implantable Lead Location: 753860
Implantable Lead Model: 5076
Implantable Lead Model: 5076
Implantable Pulse Generator Implant Date: 20160203

## 2017-11-24 NOTE — Patient Instructions (Addendum)
Medication Instructions:  none If you need a refill on your cardiac medications before your next appointment, please call your pharmacy.   Lab work: none If you have labs (blood work) drawn today and your tests are completely normal, you will receive your results only by: Marland Kitchen MyChart Message (if you have MyChart) OR . A paper copy in the mail If you have any lab test that is abnormal or we need to change your treatment, we will call you to review the results.  Testing/Procedures: none  Follow-Up: Remote monitoring is used to monitor your Pacemaker  from home. This monitoring reduces the number of office visits required to check your device to one time per year. It allows [place] to keep an eye on the functioning of your device to ensure it is working properly. You are scheduled for a device check from home on 02/19/18. You may send your transmission at any time that day. If you have a wireless device, the transmission will be sent automatically. After your physician reviews your transmission, you will receive a postcard with your next transmission date.   At [facility], you and your health needs are our priority.  As part of our continuing mission to provide you with exceptional heart care, we have created designated Provider Care Teams.  These Care Teams include your primary Cardiologist (physician) and Advanced Practice Providers (APPs -  Physician Assistants and Nurse Practitioners) who all work together to provide you with the care you need, when you need it. You will need a follow up appointment in 1 years.  Please call our office 2 months in advance to schedule this appointment.  You may see Dr Graciela Husbands or one of the following Advanced Practice Providers on your designated Care Team:   Gypsy Balsam, NP . Francis Dowse, PA-C  Any Other Special Instructions Will Be Listed Below (If Applicable).

## 2017-11-26 ENCOUNTER — Other Ambulatory Visit: Payer: Self-pay | Admitting: Internal Medicine

## 2018-01-03 ENCOUNTER — Other Ambulatory Visit: Payer: Self-pay | Admitting: Internal Medicine

## 2018-01-06 ENCOUNTER — Other Ambulatory Visit: Payer: Self-pay | Admitting: Internal Medicine

## 2018-01-11 ENCOUNTER — Other Ambulatory Visit (INDEPENDENT_AMBULATORY_CARE_PROVIDER_SITE_OTHER): Payer: PPO

## 2018-01-11 ENCOUNTER — Ambulatory Visit (INDEPENDENT_AMBULATORY_CARE_PROVIDER_SITE_OTHER): Payer: PPO | Admitting: Internal Medicine

## 2018-01-11 ENCOUNTER — Encounter: Payer: Self-pay | Admitting: Internal Medicine

## 2018-01-11 VITALS — BP 162/94 | HR 86 | Temp 98.3°F | Ht 69.0 in | Wt 174.0 lb

## 2018-01-11 DIAGNOSIS — E119 Type 2 diabetes mellitus without complications: Secondary | ICD-10-CM

## 2018-01-11 DIAGNOSIS — I10 Essential (primary) hypertension: Secondary | ICD-10-CM

## 2018-01-11 DIAGNOSIS — E785 Hyperlipidemia, unspecified: Secondary | ICD-10-CM

## 2018-01-11 LAB — BASIC METABOLIC PANEL
BUN: 10 mg/dL (ref 6–23)
CO2: 25 mEq/L (ref 19–32)
Calcium: 9.4 mg/dL (ref 8.4–10.5)
Chloride: 102 mEq/L (ref 96–112)
Creatinine, Ser: 0.78 mg/dL (ref 0.40–1.20)
GFR: 94.9 mL/min (ref >60.00)
Glucose, Bld: 393 mg/dL — ABNORMAL HIGH (ref 70–99)
Potassium: 3.8 mEq/L (ref 3.5–5.1)
Sodium: 138 mEq/L (ref 135–145)

## 2018-01-11 LAB — HEPATIC FUNCTION PANEL
ALT: 29 U/L (ref 0–35)
AST: 34 U/L (ref 0–37)
Albumin: 4.4 g/dL (ref 3.5–5.2)
Alkaline Phosphatase: 161 U/L — ABNORMAL HIGH (ref 39–117)
Bilirubin, Direct: 0.1 mg/dL (ref 0.0–0.3)
Total Bilirubin: 0.4 mg/dL (ref 0.2–1.2)
Total Protein: 7.5 g/dL (ref 6.0–8.3)

## 2018-01-11 LAB — LIPID PANEL
Cholesterol: 194 mg/dL (ref 0–200)
HDL: 45.3 mg/dL (ref >39.00)
Total CHOL/HDL Ratio: 4
Triglycerides: 460 mg/dL — ABNORMAL HIGH (ref 0.0–149.0)

## 2018-01-11 LAB — HEMOGLOBIN A1C: Hgb A1c MFr Bld: 10.1 % — ABNORMAL HIGH (ref 4.6–6.5)

## 2018-01-11 LAB — LDL CHOLESTEROL, DIRECT: Direct LDL: 81 mg/dL

## 2018-01-11 NOTE — Assessment & Plan Note (Signed)
stable overall by history and exam, recent data reviewed with pt, and pt to continue medical treatment as before,  to f/u any worsening symptoms or concerns  

## 2018-01-11 NOTE — Progress Notes (Signed)
Subjective:    Patient ID: Stacey [name], female    DOB: 1950-08-27, 67 y.o.   MRN: [id number]  HPI  Here to f/u; overall doing ok,  Pt denies chest pain, increasing sob or doe, wheezing, orthopnea, PND, increased LE swelling, palpitations, dizziness or syncope.  Pt denies new neurological symptoms such as new headache, or facial or extremity weakness or numbness.  Pt denies polydipsia, polyuria, or low sugar episode.  Pt states overall good compliance with meds, mostly trying to follow appropriate diet, with wt overall stable,  but little exercise however. BP at home < 13090.  Nervous today  To f/u with optho after jan 1 for smoky foggy vision s/p caratact/lens surgury both eyes.    No other new complaints Past Medical History:  Diagnosis Date  . ANGIOEDEMA 03/07/2008   a. with ACE-I  . ANXIETY 09/16/2006  . ASTHMA 08/01/2006  . CAD (coronary artery disease)    a. 01/2010 : Minimal plaque at cardiac catheterization - CFX 20%, EF 70%;  b. 08/2012 Cath: Nl Cors, EF 65%.  . Chronic diastolic CHF (congestive heart failure) ([place]) 04/06/2010   a. In setting of HOCM.  Marland Kitchen Complete heart block ([place]) 02/26/2014   a. s/p MDT dual chamber pacemaker 02/2014  . COPD (chronic obstructive pulmonary disease) ([place])   . DEPRESSION 09/16/2006  . DIVERTICULOSIS, COLON, WITH HEMORRHAGE 04/06/2010  . DM (diabetes mellitus) in pregnancy, delivered w/postpartum condition 08/30/2010  . HYPERLIPIDEMIA 08/01/2006  . HYPERTENSION 08/01/2006  . 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 s/p septal myomectomy 4/12 at [place] with Dr. Evelina Dun;  b. echo 5/12: EF 60-65%, LVOT peak 18 mmHg; grade 1 diast dysfxn, mild SAM (improved since myomectomy;  c. 03/2012 Echo: EF 60-65%, mod-sev basal septal asymm hypertrophy, basal septal HK, LVOT grad 110mHg, Gr 1 DD, , SAM, Mild MR, nl RV, PASP 345mg; 08/2012 Echo: technically difficult, doubt LVOT obstruction, EF 60%, Gr 1 DD, mod-sev dil LA.  . Impaired glucose tolerance 06/25/2010  . LBBB  (left bundle branch block) 06/17/2010  . OBESITY 09/16/2006  . Renal artery aneurysm ([facility]  . SICKLE CELL ANEMIA 08/01/2006  . Type II or unspecified type diabetes mellitus without mention of complication, uncontrolled 08/30/2010  . VENTRAL HERNIA 12/07/2006   Past Surgical History:  Procedure Laterality Date  . ABDOMINAL HYSTERECTOMY  1987  . CARDIAC SURGERY    . COLONOSCOPY N/A 12/26/2016   Procedure: COLONOSCOPY;  Surgeon: JaMilus BanisterMD;  [facility];  Service: Endoscopy;  Laterality: N/A;  . ELECTROPHYSIOLOGY STUDY N/A 09/12/2012   Procedure: ELECTROPHYSIOLOGY STUDY;  Surgeon: StDeboraha SprangMD;  [facility];  Service: Cardiovascular;  Laterality: N/A;  . LEFT HEART CATH  Aug. 18, 2014   Medtronic heart device  . LEFT HEART CATHETERIZATION WITH CORONARY ANGIOGRAM N/A 09/10/2012   Procedure: LEFT HEART CATHETERIZATION WITH CORONARY ANGIOGRAM;  Surgeon: ChBurnell BlanksMD;  [facility];  Service: Cardiovascular;  Laterality: N/A;  . MYOMECTOMY     Septal  . PERMANENT PACEMAKER INSERTION N/A 02/26/2014   MDT Advisa dual chamber pacemaker implanted by Dr KlCaryl Comesor heart block  . VENTRAL HERNIA REPAIR  06/2006    reports that she quit smoking about 19 years ago. Her smoking use included cigarettes. She has a 5.00 pack-year smoking history. She has never used smokeless tobacco. She reports that she does not drink alcohol or use drugs. family history includes Cancer in her daughter; Clotting disorder in her  daughter and another family member; Colon cancer in her daughter; Diabetes in her paternal grandmother; Heart attack in her father and sister; Heart disease in her daughter and father; Hyperlipidemia in her brother, daughter, and sister; Hypertension in her brother, father, sister, and son; Stroke in her sister. Allergies  Allergen Reactions  . Dairy Aid [Lactase] Diarrhea and Other (See Comments)    Bloating and gastric distress  . Metformin And  Related Other (See Comments)    GI upset  . Ace Inhibitors Other (See Comments)    REACTION: angioedema right eye  . Aspirin Other (See Comments)    Bleeding   . Codeine Other (See Comments)    Hallucinations, can take if with someone.  . Soy Allergy Other (See Comments)    Bleeding & cramps, diarrhea    Current Outpatient Medications on File Prior to Visit  Medication Sig Dispense Refill  . acetaminophen (TYLENOL) 500 MG tablet Take 500 mg by mouth daily as needed.    Marland Kitchen albuterol (PROVENTIL HFA;VENTOLIN HFA) 108 (90 BASE) MCG/ACT inhaler Inhale 2 puffs into the lungs every 6 (six) hours as needed for wheezing or shortness of breath. 18 g 5  . amLODipine (NORVASC) 10 MG tablet TAKE 1 TABLET BY MOUTH EVERY DAY 90 tablet 3  . atorvastatin (LIPITOR) 40 MG tablet TAKE 1 TABLET(40 MG) BY MOUTH DAILY 90 tablet 3  . Blood Glucose Monitoring Suppl (ONE TOUCH ULTRA 2) W/DEVICE KIT Use to check blood sugars daily Dx E11.9 1 each 0  . cetirizine (ZYRTEC) 10 MG tablet Take 10 mg by mouth daily.      . fenofibrate (TRICOR) 145 MG tablet Take 1 tablet (145 mg total) by mouth daily. 90 tablet 3  . glipiZIDE (GLUCOTROL XL) 10 MG 24 hr tablet TAKE 1 TABLET BY MOUTH DAILY WITH BREAKFAST 90 tablet 0  . glucose blood test strip 1 each by Other route daily. Use to check blood sugar daily Dx e11.9 100 each 11  . hydrocortisone (ANUSOL-HC) 2.5 % rectal cream Place rectally 2 (two) times daily. 30 g 1  . irbesartan (AVAPRO) 300 MG tablet TAKE 1 TABLET BY MOUTH EVERY NIGHT AT BEDTIME 90 tablet 1  . latanoprost (XALATAN) 0.005 % ophthalmic solution Place 1 drop into both eyes at bedtime.  3  . meclizine (ANTIVERT) 12.5 MG tablet Take 1 tablet (12.5 mg total) by mouth 3 (three) times daily as needed for dizziness. 40 tablet 2  . metoprolol succinate (TOPROL-XL) 100 MG 24 hr tablet TAKE 1 TABLET BY MOUTH EVERY DAY 90 tablet 3  . ondansetron (ZOFRAN) 4 MG tablet TAKE 1 TABLET(4 MG) BY MOUTH EVERY 8 HOURS AS NEEDED  FOR NAUSEA OR VOMITING 30 tablet 0  . ONETOUCH DELICA LANCETS 59D MISC USE TO HELP CHECK BLOOD SUGAR DAILY 200 each 0  . oxcarbazepine (TRILEPTAL) 600 MG tablet Take 600 mg by mouth 2 (two) times daily.    . potassium chloride (K-DUR,KLOR-CON) 10 MEQ tablet TAKE 2 TABLETS BY MOUTH DAILY 180 tablet 0  . topiramate (TOPAMAX) 100 MG tablet TAKE 1 TABLET BY MOUTH EVERY DAY 90 tablet 0   No current facility-administered medications on file prior to visit.    Review of Systems  Constitutional: Negative for other unusual diaphoresis or sweats HENT: Negative for ear discharge or swelling Eyes: Negative for other worsening visual disturbances Respiratory: Negative for stridor or other swelling  Gastrointestinal: Negative for worsening distension or other blood Genitourinary: Negative for retention or other urinary change Musculoskeletal: Negative  for other MSK pain or swelling Skin: Negative for color change or other new lesions Neurological: Negative for worsening tremors and other numbness  Psychiatric/Behavioral: Negative for worsening agitation or other fatigue All other system neg per pt    Objective:   Physical Exam BP (!) 162/94   Pulse 86   Temp 98.3 F (36.8 C) (Oral)   Ht _0  (1.753 m)   Wt 174 lb (78.9 kg)   SpO2 94%   BMI 25.70 kg/m  VS noted,  Constitutional: Pt appears in NAD HENT: Head: NCAT.  Right Ear: External ear normal.  Left Ear: External ear normal.  Eyes: . Pupils are equal, round, and reactive to light. Conjunctivae and EOM are normal Nose: without d/c or deformity Neck: Neck supple. Gross normal ROM Cardiovascular: Normal rate and regular rhythm.   Pulmonary/Chest: Effort normal and breath sounds without rales or wheezing.  Abd:  Soft, NT, ND, + BS, no organomegaly Neurological: Pt is alert. At baseline orientation, motor grossly intact Skin: Skin is warm. No rashes, other new lesions, no LE edema Psychiatric: Pt behavior is normal without agitation  No  other exam findings Lab Results  Component Value Date   WBC 6.0 11/09/2017   HGB 12.8 11/10/2017   HCT 39.7 11/10/2017   PLT 216.0 11/09/2017   GLUCOSE 393 (H) 01/11/2018   CHOL 194 01/11/2018   TRIG (H) 01/11/2018    460.0 Triglyceride is over 400; calculations on Lipids are invalid.   HDL 45.30 01/11/2018   LDLDIRECT 81.0 01/11/2018   LDLCALC 79 09/11/2012   ALT 29 01/11/2018   AST 34 01/11/2018   NA 138 01/11/2018   K 3.8 01/11/2018   CL 102 01/11/2018   CREATININE 0.78 01/11/2018   BUN 10 01/11/2018   CO2 25 01/11/2018   TSH 1.10 07/12/2017   INR 1.35 12/23/2016   HGBA1C 10.1 (H) 01/11/2018   MICROALBUR 103.6 (H) 07/12/2017       Assessment & Plan:

## 2018-01-11 NOTE — Patient Instructions (Signed)

## 2018-01-14 LAB — CUP PACEART REMOTE DEVICE CHECK
Battery Remaining Longevity: 80 mo
Battery Voltage: 3.01 V
Brady Statistic AP VP Percent: 14.02 %
Brady Statistic AP VS Percent: 0.63 %
Brady Statistic AS VP Percent: 58.88 %
Brady Statistic AS VS Percent: 26.48 %
Brady Statistic RA Percent Paced: 14.63 %
Brady Statistic RV Percent Paced: 72.8 %
Date Time Interrogation Session: 20191029113010
Implantable Lead Implant Date: 20160203
Implantable Lead Implant Date: 20160203
Implantable Lead Location: 753859
Implantable Lead Location: 753860
Implantable Lead Model: 5076
Implantable Lead Model: 5076
Implantable Pulse Generator Implant Date: 20160203
Lead Channel Impedance Value: 342 Ohm
Lead Channel Impedance Value: 437 Ohm
Lead Channel Impedance Value: 437 Ohm
Lead Channel Impedance Value: 570 Ohm
Lead Channel Pacing Threshold Amplitude: 0.625 V
Lead Channel Pacing Threshold Amplitude: 0.75 V
Lead Channel Pacing Threshold Pulse Width: 0.4 ms
Lead Channel Pacing Threshold Pulse Width: 0.4 ms
Lead Channel Sensing Intrinsic Amplitude: 2.5 mV
Lead Channel Sensing Intrinsic Amplitude: 2.5 mV
Lead Channel Sensing Intrinsic Amplitude: 9.5 mV
Lead Channel Sensing Intrinsic Amplitude: 9.5 mV
Lead Channel Setting Pacing Amplitude: 2 V
Lead Channel Setting Pacing Amplitude: 2.5 V
Lead Channel Setting Pacing Pulse Width: 0.4 ms
Lead Channel Setting Sensing Sensitivity: 2.8 mV

## 2018-01-16 ENCOUNTER — Encounter: Payer: Self-pay | Admitting: Internal Medicine

## 2018-01-16 ENCOUNTER — Other Ambulatory Visit: Payer: Self-pay | Admitting: Internal Medicine

## 2018-01-16 MED ORDER — DULAGLUTIDE 0.75 MG/0.5ML ~~LOC~~ SOAJ: 0.5000 mL | SUBCUTANEOUS | 3 refills | Status: DC

## 2018-01-18 ENCOUNTER — Telehealth: Payer: Self-pay

## 2018-01-18 NOTE — Telephone Encounter (Signed)
Patient returning call regarding lab results  ?

## 2018-01-18 NOTE — Telephone Encounter (Signed)
Attempted to return call to pt.  See lab result note.

## 2018-01-18 NOTE — Telephone Encounter (Signed)
Called pt, LVM.   CRM created.  

## 2018-01-18 NOTE — Telephone Encounter (Signed)
-----   Message from Biagio Borg, MD sent at 01/16/2018 12:43 PM EST ----- Left message on MyChart, pt to cont same tx except  The test results show that your current treatment is OK, except the A1c is quite high.  We need to add medication for this.  You have not been able to take the metformin in the past, so we should continue the current treatment, but add Trulicity [id number] ml weekly.  This can also help with weight loss.  If the Trulicity is not covered by your insurance, please ask at the pharmacy about which similar medication is covered.    Stacey [name] to please inform pt, I will do rx

## 2018-02-14 ENCOUNTER — Telehealth: Payer: Self-pay | Admitting: Internal Medicine

## 2018-02-14 MED ORDER — CLONAZEPAM 0.5 MG PO TABS: 0.5000 mg | ORAL_TABLET | Freq: Two times a day (BID) | ORAL | 0 refills | Status: DC | PRN

## 2018-02-14 NOTE — Telephone Encounter (Signed)
Copied from [place] [id number]. Topic: Quick Communication - Rx Refill/Question >> Feb 14, 2018 10:44 AM Waylan Rocher, Lumin L wrote:  Medication: valium (patient's cat was put down and patient is crying and depressed requesting valium because she has a weak heart and bp 147/84, also mentioned her husband having cancer, please advise)   Preferred Pharmacy (with phone number or street name): [facility] [place], [place] - [address] AT [address] [place] [address] Phone: [phone] Fax: [phone]

## 2018-02-14 NOTE — Addendum Note (Signed)
Addended by: [name] on: 02/14/2018 12:52 PM   Modules accepted: Orders

## 2018-02-14 NOTE — Telephone Encounter (Signed)
Pt has been informed.

## 2018-02-14 NOTE — Telephone Encounter (Signed)
Ok for klonopin 0.5 bid prn, but will need ROV if need further refills

## 2018-02-28 ENCOUNTER — Other Ambulatory Visit: Payer: Self-pay | Admitting: Internal Medicine

## 2018-03-01 ENCOUNTER — Encounter: Payer: Self-pay | Admitting: Internal Medicine

## 2018-03-01 ENCOUNTER — Ambulatory Visit (INDEPENDENT_AMBULATORY_CARE_PROVIDER_SITE_OTHER): Payer: PPO | Admitting: Internal Medicine

## 2018-03-01 ENCOUNTER — Ambulatory Visit: Payer: Self-pay

## 2018-03-01 VITALS — BP 154/102 | HR 85 | Temp 98.7°F | Ht 69.0 in | Wt 177.0 lb

## 2018-03-01 DIAGNOSIS — I10 Essential (primary) hypertension: Secondary | ICD-10-CM | POA: Diagnosis not present

## 2018-03-01 DIAGNOSIS — E785 Hyperlipidemia, unspecified: Secondary | ICD-10-CM | POA: Diagnosis not present

## 2018-03-01 DIAGNOSIS — E119 Type 2 diabetes mellitus without complications: Secondary | ICD-10-CM

## 2018-03-01 MED ORDER — HYDROCHLOROTHIAZIDE 12.5 MG PO CAPS: 12.5000 mg | ORAL_CAPSULE | Freq: Every day | ORAL | 3 refills | Status: DC

## 2018-03-01 MED ORDER — LOSARTAN POTASSIUM 100 MG PO TABS: 100.0000 mg | ORAL_TABLET | Freq: Every day | ORAL | 3 refills | Status: DC

## 2018-03-01 NOTE — Assessment & Plan Note (Signed)
stable overall by history and exam, recent data reviewed with pt, and pt to continue medical treatment as before,  to f/u any worsening symptoms or concerns  

## 2018-03-01 NOTE — Assessment & Plan Note (Addendum)
Uncontrolled off the ARB, o/w stable overall by history and exam, recent data reviewed with pt, and pt to cont BB and amlodipine, but add losartan 100 qd and HCT 12.5 qd,  to f/u any worsening symptoms or concerns, continue to monitor the BP at home as she does

## 2018-03-01 NOTE — Telephone Encounter (Signed)
Noted  

## 2018-03-01 NOTE — Progress Notes (Signed)
Subjective:    Patient ID: Stacey [name], female    DOB: [birth year], 68 y.o.   MRN: [id number]  HPI   Here to f/u; overall doing ok,  Pt denies chest pain, increasing sob or doe, wheezing, orthopnea, PND, increased LE swelling, palpitations, dizziness or syncope.  Pt denies new neurological symptoms such as new headache, or facial or extremity weakness or numbness.  Pt denies polydipsia, polyuria, or low sugar episode.  Pt states overall good compliance with meds, mostly trying to follow appropriate diet, with wt overall stable,  but little exercise however, and has been out of the irbesartan due to the med recall. Wt Readings from Last 3 Encounters:  03/01/18 177 lb (80.3 kg)  01/11/18 174 lb (78.9 kg)  11/24/17 178 lb 3.2 oz (80.8 kg)   Past Medical History:  Diagnosis Date  . ANGIOEDEMA 03/07/2008   a. with ACE-I  . ANXIETY 09/16/2006  . ASTHMA 08/01/2006  . CAD (coronary artery disease)    a. 01/2010 : Minimal plaque at cardiac catheterization - CFX 20%, EF 70%;  b. 08/2012 Cath: Nl Cors, EF 65%.  . Chronic diastolic CHF (congestive heart failure) ([place]) 04/06/2010   a. In setting of HOCM.  Marland Kitchen Complete heart block ([place]) 02/26/2014   a. s/p MDT dual chamber pacemaker 02/2014  . COPD (chronic obstructive pulmonary disease) ([place])   . DEPRESSION 09/16/2006  . DIVERTICULOSIS, COLON, WITH HEMORRHAGE 04/06/2010  . DM (diabetes mellitus) in pregnancy, delivered w/postpartum condition 08/30/2010  . HYPERLIPIDEMIA 08/01/2006  . HYPERTENSION 08/01/2006  . 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 s/p septal myomectomy 4/12 at [place] with Dr. Evelina Dun;  b. echo 5/12: EF 60-65%, LVOT peak 18 mmHg; grade 1 diast dysfxn, mild SAM (improved since myomectomy;  c. 03/2012 Echo: EF 60-65%, mod-sev basal septal asymm hypertrophy, basal septal HK, LVOT grad 72mHg, Gr 1 DD, , SAM, Mild MR, nl RV, PASP 376mg; 08/2012 Echo: technically difficult, doubt LVOT obstruction, EF 60%, Gr 1 DD, mod-sev dil LA.  . Impaired  glucose tolerance 06/25/2010  . LBBB (left bundle branch block) 06/17/2010  . OBESITY 09/16/2006  . Renal artery aneurysm ([place]  . SICKLE CELL ANEMIA 08/01/2006  . Type II or unspecified type diabetes mellitus without mention of complication, uncontrolled 08/30/2010  . VENTRAL HERNIA 12/07/2006   Past Surgical History:  Procedure Laterality Date  . ABDOMINAL HYSTERECTOMY  1987  . CARDIAC SURGERY    . COLONOSCOPY N/A 12/26/2016   Procedure: COLONOSCOPY;  Surgeon: JaMilus BanisterMD;  Location: [facility];  Service: Endoscopy;  Laterality: N/A;  . ELECTROPHYSIOLOGY STUDY N/A 09/12/2012   Procedure: ELECTROPHYSIOLOGY STUDY;  Surgeon: StDeboraha SprangMD;  [facility];  Service: Cardiovascular;  Laterality: N/A;  . LEFT HEART CATH  Aug. 18, 2014   Medtronic heart device  . LEFT HEART CATHETERIZATION WITH CORONARY ANGIOGRAM N/A 09/10/2012   Procedure: LEFT HEART CATHETERIZATION WITH CORONARY ANGIOGRAM;  Surgeon: ChBurnell BlanksMD;  [facility];  Service: Cardiovascular;  Laterality: N/A;  . MYOMECTOMY     Septal  . PERMANENT PACEMAKER INSERTION N/A 02/26/2014   MDT Advisa dual chamber pacemaker implanted by Dr KlCaryl Comesor heart block  . VENTRAL HERNIA REPAIR  06/2006    reports that she quit smoking about 20 years ago. Her smoking use included cigarettes. She has a 5.00 pack-year smoking history. She has never used smokeless tobacco. She reports that she does not drink alcohol or use drugs. family history  includes Cancer in her daughter; Clotting disorder in her daughter and another family member; Colon cancer in her daughter; Diabetes in her paternal grandmother; Heart attack in her father and sister; Heart disease in her daughter and father; Hyperlipidemia in her brother, daughter, and sister; Hypertension in her brother, father, sister, and son; Stroke in her sister. Allergies  Allergen Reactions  . Dairy Aid [Lactase] Diarrhea and Other (See Comments)    Bloating and  gastric distress  . Metformin And Related Other (See Comments)    GI upset  . Ace Inhibitors Other (See Comments)    REACTION: angioedema right eye  . Aspirin Other (See Comments)    Bleeding   . Codeine Other (See Comments)    Hallucinations, can take if with someone.  . Soy Allergy Other (See Comments)    Bleeding & cramps, diarrhea    Current Outpatient Medications on File Prior to Visit  Medication Sig Dispense Refill  . acetaminophen (TYLENOL) 500 MG tablet Take 500 mg by mouth daily as needed.    Marland Kitchen albuterol (PROVENTIL HFA;VENTOLIN HFA) 108 (90 BASE) MCG/ACT inhaler Inhale 2 puffs into the lungs every 6 (six) hours as needed for wheezing or shortness of breath. 18 g 5  . amLODipine (NORVASC) 10 MG tablet TAKE 1 TABLET BY MOUTH EVERY DAY 90 tablet 3  . atorvastatin (LIPITOR) 40 MG tablet TAKE 1 TABLET(40 MG) BY MOUTH DAILY 90 tablet 3  . Blood Glucose Monitoring Suppl (ONE TOUCH ULTRA 2) W/DEVICE KIT Use to check blood sugars daily Dx E11.9 1 each 0  . cetirizine (ZYRTEC) 10 MG tablet Take 10 mg by mouth daily.      . clonazePAM (KLONOPIN) 0.5 MG tablet Take 1 tablet (0.5 mg total) by mouth 2 (two) times daily as needed for anxiety. 30 tablet 0  . Dulaglutide (TRULICITY) 7.25 DG/6.4QI SOPN Inject 0.5 mLs into the skin once a week. 6 mL 3  . fenofibrate (TRICOR) 145 MG tablet Take 1 tablet (145 mg total) by mouth daily. 90 tablet 3  . glipiZIDE (GLUCOTROL XL) 10 MG 24 hr tablet TAKE 1 TABLET BY MOUTH EVERY DAY WITH BREAKFAST 90 tablet 0  . glucose blood test strip 1 each by Other route daily. Use to check blood sugar daily Dx e11.9 100 each 11  . hydrocortisone (ANUSOL-HC) 2.5 % rectal cream Place rectally 2 (two) times daily. 30 g 1  . irbesartan (AVAPRO) 300 MG tablet TAKE 1 TABLET BY MOUTH EVERY NIGHT AT BEDTIME 90 tablet 1  . latanoprost (XALATAN) 0.005 % ophthalmic solution Place 1 drop into both eyes at bedtime.  3  . meclizine (ANTIVERT) 12.5 MG tablet Take 1 tablet (12.5 mg  total) by mouth 3 (three) times daily as needed for dizziness. 40 tablet 2  . metoprolol succinate (TOPROL-XL) 100 MG 24 hr tablet TAKE 1 TABLET BY MOUTH EVERY DAY 90 tablet 3  . ondansetron (ZOFRAN) 4 MG tablet TAKE 1 TABLET(4 MG) BY MOUTH EVERY 8 HOURS AS NEEDED FOR NAUSEA OR VOMITING 30 tablet 0  . ONETOUCH DELICA LANCETS 34V MISC USE TO HELP CHECK BLOOD SUGAR DAILY 200 each 0  . oxcarbazepine (TRILEPTAL) 600 MG tablet Take 600 mg by mouth 2 (two) times daily.    . potassium chloride (K-DUR,KLOR-CON) 10 MEQ tablet TAKE 2 TABLETS BY MOUTH DAILY 180 tablet 0  . topiramate (TOPAMAX) 100 MG tablet TAKE 1 TABLET BY MOUTH EVERY DAY 90 tablet 0   No current facility-administered medications on file prior to visit.  Review of Systems  Constitutional: Negative for other unusual diaphoresis or sweats HENT: Negative for ear discharge or swelling Eyes: Negative for other worsening visual disturbances Respiratory: Negative for stridor or other swelling  Gastrointestinal: Negative for worsening distension or other blood Genitourinary: Negative for retention or other urinary change Musculoskeletal: Negative for other MSK pain or swelling Skin: Negative for color change or other new lesions Neurological: Negative for worsening tremors and other numbness  Psychiatric/Behavioral: Negative for worsening agitation or other fatigue All other system neg per pt    Objective:   Physical Exam BP (!) 154/102   Pulse 85   Temp 98.7 F (37.1 C) (Oral)   Ht '5\' 9"'$  (1.753 m)   Wt 177 lb (80.3 kg)   SpO2 97%   BMI 26.14 kg/m  VS noted, not ill appearing Constitutional: Pt appears in NAD HENT: Head: NCAT.  Right Ear: External ear normal.  Left Ear: External ear normal.  Eyes: . Pupils are equal, round, and reactive to light. Conjunctivae and EOM are normal Nose: without d/c or deformity Neck: Neck supple. Gross normal ROM Cardiovascular: Normal rate and regular rhythm.   Pulmonary/Chest: Effort normal  and breath sounds without rales or wheezing.  Neurological: Pt is alert. At baseline orientation, motor grossly intact Skin: Skin is warm. No rashes, other new lesions, no LE edema Psychiatric: Pt behavior is normal without agitation  No other exam findings Lab Results  Component Value Date   WBC 6.0 11/09/2017   HGB 12.8 11/10/2017   HCT 39.7 11/10/2017   PLT 216.0 11/09/2017   GLUCOSE 393 (H) 01/11/2018   CHOL 194 01/11/2018   TRIG (H) 01/11/2018    460.0 Triglyceride is over 400; calculations on Lipids are invalid.   HDL 45.30 01/11/2018   LDLDIRECT 81.0 01/11/2018   LDLCALC 79 09/11/2012   ALT 29 01/11/2018   AST 34 01/11/2018   NA 138 01/11/2018   K 3.8 01/11/2018   CL 102 01/11/2018   CREATININE 0.78 01/11/2018   BUN 10 01/11/2018   CO2 25 01/11/2018   TSH 1.10 07/12/2017   INR 1.35 12/23/2016   HGBA1C 10.1 (H) 01/11/2018   MICROALBUR 103.6 (H) 07/12/2017       Assessment & Plan:

## 2018-03-01 NOTE — Patient Instructions (Signed)
Please take all new medication as prescribed - the losartan 100 mg, and the HCT (mild fluid pill) at a low dose of 12.5 mg daily  Please continue all other medications as before, and refills have been done if requested.  Please have the pharmacy call with any other refills you may need.  Please continue your efforts at being more active, low cholesterol diet, and weight control.  Please keep your appointments with your specialists as you may have planned

## 2018-03-01 NOTE — Telephone Encounter (Signed)
Pt. Reports she has had elevated BP readings at home x 2 days. Has a headache as well. Has not missed any doses of medication. 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 Had a nose bleed 2 days ago. No other symptoms - denies chest pain or shortness of breath. Appointment made for this morning with her provider.  Reason for Disposition . Systolic BP  >= 99991111 OR Diastolic >= [id number]  Answer Assessment - Initial Assessment Questions 1. BLOOD PRESSURE: "What is the blood pressure?" "Did you take at least two measurements 5 minutes apart?"     179/117 2. ONSET: "When did you take your blood pressure?"     0815 3. HOW: "How did you obtain the blood pressure?" (e.g., visiting nurse, automatic home BP monitor)     Home monitor 4. HISTORY: "Do you have a history of high blood pressure?"     Yes 5. MEDICATIONS: "Are you taking any medications for blood pressure?" "Have you missed any doses recently?"     Yes - no missed dose 6. OTHER SYMPTOMS: "Do you have any symptoms?" (e.g., headache, chest pain, blurred vision, difficulty breathing, weakness)     Headache 7. PREGNANCY: "Is there any chance you are pregnant?" "When was your last menstrual period?"     No  Protocols used: HIGH BLOOD PRESSURE-A-AH

## 2018-03-05 ENCOUNTER — Encounter: Payer: Self-pay | Admitting: Cardiology

## 2018-03-20 ENCOUNTER — Ambulatory Visit (INDEPENDENT_AMBULATORY_CARE_PROVIDER_SITE_OTHER): Payer: PPO | Admitting: *Deleted

## 2018-03-20 DIAGNOSIS — I442 Atrioventricular block, complete: Secondary | ICD-10-CM | POA: Diagnosis not present

## 2018-03-21 LAB — CUP PACEART REMOTE DEVICE CHECK
Battery Remaining Longevity: 79 mo
Battery Voltage: 3.01 V
Brady Statistic AP VP Percent: 15.46 %
Brady Statistic AP VS Percent: 0 %
Brady Statistic AS VP Percent: 84.5 %
Brady Statistic AS VS Percent: 0.04 %
Brady Statistic RA Percent Paced: 15.45 %
Brady Statistic RV Percent Paced: 99.9 %
Date Time Interrogation Session: 20200224172609
Implantable Lead Implant Date: 20160203
Implantable Lead Implant Date: 20160203
Implantable Lead Location: 753859
Implantable Lead Location: 753860
Implantable Lead Model: 5076
Implantable Lead Model: 5076
Implantable Pulse Generator Implant Date: 20160203
Lead Channel Impedance Value: 361 Ohm
Lead Channel Impedance Value: 437 Ohm
Lead Channel Impedance Value: 494 Ohm
Lead Channel Impedance Value: 551 Ohm
Lead Channel Pacing Threshold Amplitude: 0.625 V
Lead Channel Pacing Threshold Amplitude: 1 V
Lead Channel Pacing Threshold Pulse Width: 0.4 ms
Lead Channel Pacing Threshold Pulse Width: 0.4 ms
Lead Channel Sensing Intrinsic Amplitude: 3 mV
Lead Channel Sensing Intrinsic Amplitude: 3 mV
Lead Channel Sensing Intrinsic Amplitude: 8.75 mV
Lead Channel Sensing Intrinsic Amplitude: 8.75 mV
Lead Channel Setting Pacing Amplitude: 2 V
Lead Channel Setting Pacing Amplitude: 2.5 V
Lead Channel Setting Pacing Pulse Width: 0.4 ms
Lead Channel Setting Sensing Sensitivity: 2.8 mV

## 2018-03-25 ENCOUNTER — Emergency Department (HOSPITAL_COMMUNITY): Payer: PPO

## 2018-03-25 ENCOUNTER — Emergency Department (HOSPITAL_COMMUNITY)
Admission: EM | Admit: 2018-03-25 | Discharge: 2018-03-25 | Disposition: A | Discharge status: Home / Self Care | Payer: PPO | Attending: Emergency Medicine | Admitting: Emergency Medicine

## 2018-03-25 ENCOUNTER — Other Ambulatory Visit: Payer: Self-pay

## 2018-03-25 ENCOUNTER — Encounter (HOSPITAL_COMMUNITY): Payer: Self-pay | Admitting: Oncology

## 2018-03-25 DIAGNOSIS — Z79899 Other long term (current) drug therapy: Secondary | ICD-10-CM | POA: Diagnosis not present

## 2018-03-25 DIAGNOSIS — I110 Hypertensive heart disease with heart failure: Secondary | ICD-10-CM | POA: Insufficient documentation

## 2018-03-25 DIAGNOSIS — Z87891 Personal history of nicotine dependence: Secondary | ICD-10-CM | POA: Insufficient documentation

## 2018-03-25 DIAGNOSIS — E119 Type 2 diabetes mellitus without complications: Secondary | ICD-10-CM | POA: Diagnosis not present

## 2018-03-25 DIAGNOSIS — I2510 Atherosclerotic heart disease of native coronary artery without angina pectoris: Secondary | ICD-10-CM | POA: Insufficient documentation

## 2018-03-25 DIAGNOSIS — J449 Chronic obstructive pulmonary disease, unspecified: Secondary | ICD-10-CM | POA: Diagnosis not present

## 2018-03-25 DIAGNOSIS — R1084 Generalized abdominal pain: Secondary | ICD-10-CM

## 2018-03-25 DIAGNOSIS — I5032 Chronic diastolic (congestive) heart failure: Secondary | ICD-10-CM | POA: Diagnosis not present

## 2018-03-25 DIAGNOSIS — Z7984 Long term (current) use of oral hypoglycemic drugs: Secondary | ICD-10-CM | POA: Insufficient documentation

## 2018-03-25 DIAGNOSIS — R112 Nausea with vomiting, unspecified: Secondary | ICD-10-CM | POA: Diagnosis not present

## 2018-03-25 DIAGNOSIS — I10 Essential (primary) hypertension: Secondary | ICD-10-CM | POA: Diagnosis not present

## 2018-03-25 LAB — COMPREHENSIVE METABOLIC PANEL
ALT: 37 U/L (ref 0–44)
AST: 41 U/L (ref 15–41)
Albumin: 4 g/dL (ref 3.5–5.0)
Alkaline Phosphatase: 167 U/L — ABNORMAL HIGH (ref 38–126)
Anion gap: 12 (ref 5–15)
BUN: 19 mg/dL (ref 8–23)
CO2: 24 mmol/L (ref 22–32)
Calcium: 9.6 mg/dL (ref 8.9–10.3)
Chloride: 100 mmol/L (ref 98–111)
Creatinine, Ser: 0.89 mg/dL (ref 0.44–1.00)
GFR calc Af Amer: >60 mL/min (ref >60)
GFR calc non Af Amer: >60 mL/min (ref >60)
Glucose, Bld: 247 mg/dL — ABNORMAL HIGH (ref 70–99)
Potassium: 3.8 mmol/L (ref 3.5–5.1)
Sodium: 136 mmol/L (ref 135–145)
Total Bilirubin: 0.4 mg/dL (ref 0.3–1.2)
Total Protein: 7.4 g/dL (ref 6.5–8.1)

## 2018-03-25 LAB — CBC WITH DIFFERENTIAL/PLATELET
Abs Immature Granulocytes: 0.01 10*3/uL (ref 0.00–0.07)
Basophils Absolute: 0 10*3/uL (ref 0.0–0.1)
Basophils Relative: 1 %
Eosinophils Absolute: 0.1 10*3/uL (ref 0.0–0.5)
Eosinophils Relative: 2 %
HCT: 45.7 % (ref 36.0–46.0)
Hemoglobin: 14.2 g/dL (ref 12.0–15.0)
Immature Granulocytes: 0 %
Lymphocytes Relative: 29 %
Lymphs Abs: 2 10*3/uL (ref 0.7–4.0)
MCH: 23.9 pg — ABNORMAL LOW (ref 26.0–34.0)
MCHC: 31.1 g/dL (ref 30.0–36.0)
MCV: 76.9 fL — ABNORMAL LOW (ref 80.0–100.0)
Monocytes Absolute: 0.4 10*3/uL (ref 0.1–1.0)
Monocytes Relative: 6 %
Neutro Abs: 4.5 10*3/uL (ref 1.7–7.7)
Neutrophils Relative %: 62 %
Platelets: 204 10*3/uL (ref 150–400)
RBC: 5.94 MIL/uL — ABNORMAL HIGH (ref 3.87–5.11)
RDW: 14.6 % (ref 11.5–15.5)
WBC: 7.2 10*3/uL (ref 4.0–10.5)
nRBC: 0 % (ref 0.0–0.2)

## 2018-03-25 LAB — URINALYSIS, ROUTINE W REFLEX MICROSCOPIC
Bilirubin Urine: NEGATIVE
Glucose, UA: NEGATIVE mg/dL
Hgb urine dipstick: NEGATIVE
Ketones, ur: NEGATIVE mg/dL
Leukocytes,Ua: NEGATIVE
Nitrite: NEGATIVE
Protein, ur: NEGATIVE mg/dL
Specific Gravity, Urine: 1.036 — ABNORMAL HIGH (ref 1.005–1.030)
pH: 5 (ref 5.0–8.0)

## 2018-03-25 LAB — LIPASE, BLOOD: Lipase: 44 U/L (ref 11–51)

## 2018-03-25 MED ORDER — ONDANSETRON HCL 4 MG/2ML IJ SOLN
4.0000 mg | Freq: Once | INTRAMUSCULAR | Status: AC
  Administered 2018-03-25: 4 mg via INTRAVENOUS
  Filled 2018-03-25: qty 2

## 2018-03-25 MED ORDER — SODIUM CHLORIDE 0.9 % IV BOLUS
500.0000 mL | Freq: Once | INTRAVENOUS | Status: AC
  Administered 2018-03-25: 500 mL via INTRAVENOUS

## 2018-03-25 MED ORDER — IOHEXOL 300 MG/ML  SOLN
100.0000 mL | Freq: Once | INTRAMUSCULAR | Status: AC | PRN
  Administered 2018-03-25: 100 mL via INTRAVENOUS

## 2018-03-25 MED ORDER — MORPHINE SULFATE (PF) 4 MG/ML IV SOLN
4.0000 mg | Freq: Once | INTRAVENOUS | Status: AC
  Administered 2018-03-25: 4 mg via INTRAVENOUS
  Filled 2018-03-25: qty 1

## 2018-03-25 MED ORDER — ONDANSETRON 4 MG PO TBDP: 4.0000 mg | ORAL_TABLET | Freq: Three times a day (TID) | ORAL | 0 refills | Status: DC | PRN

## 2018-03-25 NOTE — ED Provider Notes (Signed)
[facility] EMERGENCY DEPARTMENT Provider Note   CSN: [id number] Arrival date & time: 03/25/18  0259    History   Chief Complaint Chief Complaint  Patient presents with  . Abdominal Pain    HPI Stacey [name] is a 68 y.o. female.     The history is provided by the patient and medical records.     68 year old female with history of anxiety, asthma, coronary artery disease, CHF, COPD, depression, hyperlipidemia, hypertension, diabetes, presenting to the ED with abdominal pain.  States this is been ongoing for about 4 days but steadily worsening.  States pain is generalized, but more severe in her mid abdomen.  States she is starting to feel very bloated and "full" in her abdomen.  She has had several episodes of vomiting but remains constantly nauseated.  She has not been able to have a normal bowel movement in several days--some are like small "pellets" and very hard, otherwise it is loose and watery.  She denies any blood in the stool.  States she thought she may be having diverticulitis so she changed her diet but symptoms only seem to get worse.  Prior abdominal surgeries include hysterectomy and hernia repair.  Past Medical History:  Diagnosis Date  . ANGIOEDEMA 03/07/2008   a. with ACE-I  . ANXIETY 09/16/2006  . ASTHMA 08/01/2006  . CAD (coronary artery disease)    a. 01/2010 : Minimal plaque at cardiac catheterization - CFX 20%, EF 70%;  b. 08/2012 Cath: Nl Cors, EF 65%.  . Chronic diastolic CHF (congestive heart failure) ([place]) 04/06/2010   a. In setting of HOCM.  Marland Kitchen Complete heart block ([place]) 02/26/2014   a. s/p MDT dual chamber pacemaker 02/2014  . COPD (chronic obstructive pulmonary disease) ([place])   . DEPRESSION 09/16/2006  . DIVERTICULOSIS, COLON, WITH HEMORRHAGE 04/06/2010  . DM (diabetes mellitus) in pregnancy, delivered w/postpartum condition 08/30/2010  . HYPERLIPIDEMIA 08/01/2006  . HYPERTENSION 08/01/2006  . 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  s/p septal myomectomy 4/12 at [facility] with Dr. Evelina Dun;  b. echo 5/12: EF 60-65%, LVOT peak 18 mmHg; grade 1 diast dysfxn, mild SAM (improved since myomectomy;  c. 03/2012 Echo: EF 60-65%, mod-sev basal septal asymm hypertrophy, basal septal HK, LVOT grad 54mHg, Gr 1 DD, , SAM, Mild MR, nl RV, PASP 31mg; 08/2012 Echo: technically difficult, doubt LVOT obstruction, EF 60%, Gr 1 DD, mod-sev dil LA.  . Impaired glucose tolerance 06/25/2010  . LBBB (left bundle branch block) 06/17/2010  . OBESITY 09/16/2006  . Renal artery aneurysm ([facility]  . SICKLE CELL ANEMIA 08/01/2006  . Type II or unspecified type diabetes mellitus without mention of complication, uncontrolled 08/30/2010  . VENTRAL HERNIA 12/07/2006    Patient Active Problem List   Diagnosis Date Noted  . Right inguinal hernia 07/12/2017  . Hernia of abdominal wall 07/12/2017  . Iron deficiency anemia due to chronic blood loss 01/26/2017  . 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 Hematochezia   . Acute blood loss anemia   . Fibrinogen decreased ([facility]  . Lower GI bleed 12/21/2016  . Acute gastroenteritis 12/31/2015  . N&V (nausea and vomiting) 09/29/2015  . Diarrhea 09/29/2015  . Abdominal mass, RLQ (right lower quadrant) 09/04/2015  . Preventative health care 09/04/2015  . Wheezing 11/14/2014  . S/P myomectomy 04/08/2014  . Complete heart block ([facility] [place]02/03/2014  . LINQ recorder  02/26/2014  . Pacemaker-Medtronic  MRI compatible 02/26/2014  . Nocturnal hypoxemia 01/30/2014  . Pancreatitis 10/22/2013  .  Odynophagia 10/22/2013  . Chest pain syndrome 10/21/2013  . Syncope 12/18/2012  . Loop recorder-LINQ 12/18/2012  . Palpitations 09/12/2012  . Midsternal chest pain 09/12/2012  . 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 Abdominal pain 07/24/2012  . Polyarthralgia 07/24/2012  . Angioedema 03/04/2012  . Varicose veins of lower extremities with other complications [id number]  . Abnormal liver function test 09/07/2011  . Renal artery aneurysm  ([place]) 09/07/2011  . Trigeminal neuralgia 08/15/2011  . Vertigo 07/27/2011  . Diabetes ([place]) 08/30/2010  . Encounter for long-term (current) use of high-risk medication 08/30/2010  . Status post myomectomy 06/17/2010  . LBBB (left bundle branch block) 06/17/2010  . Chronic diastolic heart failure ([place]) 04/06/2010  . Diverticulosis of large intestine 04/06/2010  . DIVERTICULOSIS, COLON, WITH HEMORRHAGE 04/06/2010  . RECTAL BLEEDING 04/06/2010  . BRADYCARDIA 10/13/2009  . ALLERGIC RHINITIS 12/07/2006  . VENTRAL HERNIA 12/07/2006  . COLONIC POLYPS, HX OF 12/07/2006  . OBESITY 09/16/2006  . ANXIETY 09/16/2006  . DEPRESSION 09/16/2006  . Hyperlipidemia 08/01/2006  . Sickle cell disease ([place]) 08/01/2006  . Essential hypertension 08/01/2006  . Hypertrophic obstructive cardiomyopathy ([place]) 08/01/2006  . Asthma 08/01/2006    Past Surgical History:  Procedure Laterality Date  . ABDOMINAL HYSTERECTOMY  1987  . CARDIAC SURGERY    . COLONOSCOPY N/A 12/26/2016   Procedure: COLONOSCOPY;  Surgeon: Milus Banister, MD;  Location: [facility] ENDOSCOPY;  Service: Endoscopy;  Laterality: N/A;  . ELECTROPHYSIOLOGY STUDY N/A 09/12/2012   Procedure: ELECTROPHYSIOLOGY STUDY;  Surgeon: Deboraha Sprang, MD;  Location: [facility] CATH LAB;  Service: Cardiovascular;  Laterality: N/A;  . LEFT HEART CATH  Aug. 18, 2014   Medtronic heart device  . LEFT HEART CATHETERIZATION WITH CORONARY ANGIOGRAM N/A 09/10/2012   Procedure: LEFT HEART CATHETERIZATION WITH CORONARY ANGIOGRAM;  Surgeon: Burnell Blanks, MD;  Location: [facility] CATH LAB;  Service: Cardiovascular;  Laterality: N/A;  . MYOMECTOMY     Septal  . PERMANENT PACEMAKER INSERTION N/A 02/26/2014   MDT Advisa dual chamber pacemaker implanted by Dr Caryl Comes for heart block  . VENTRAL HERNIA REPAIR  06/2006     OB History   No obstetric history on file.      Home Medications    Prior to Admission medications   Medication Sig Start Date End Date Taking? Authorizing  Provider  acetaminophen (TYLENOL) 500 MG tablet Take 500 mg by mouth daily as needed.    [provider]  albuterol (PROVENTIL HFA;VENTOLIN HFA) 108 (90 BASE) MCG/ACT inhaler Inhale 2 puffs into the lungs every 6 (six) hours as needed for wheezing or shortness of breath. 11/14/14   Biagio Borg, MD  amLODipine (NORVASC) 10 MG tablet TAKE 1 TABLET BY MOUTH EVERY DAY 04/18/16   Biagio Borg, MD  atorvastatin (LIPITOR) 40 MG tablet TAKE 1 TABLET(40 MG) BY MOUTH DAILY 08/08/17   Biagio Borg, MD  Blood Glucose Monitoring Suppl (ONE TOUCH ULTRA 2) W/DEVICE KIT Use to check blood sugars daily Dx E11.9 05/16/14   Biagio Borg, MD  cetirizine (ZYRTEC) 10 MG tablet Take 10 mg by mouth daily.      [provider]  clonazePAM (KLONOPIN) 0.5 MG tablet Take 1 tablet (0.5 mg total) by mouth 2 (two) times daily as needed for anxiety. 02/14/18   Biagio Borg, MD  Dulaglutide (TRULICITY) [id number] 0000000 SOPN Inject 0.5 mLs into the skin once a week. 01/16/18   Biagio Borg, MD  fenofibrate (TRICOR) 145 MG tablet Take 1 tablet (145 mg  total) by mouth daily. 01/10/17   Biagio Borg, MD  glipiZIDE (GLUCOTROL XL) 10 MG 24 hr tablet TAKE 1 TABLET BY MOUTH EVERY DAY WITH BREAKFAST 02/28/18   Biagio Borg, MD  glucose blood test strip 1 each by Other route daily. Use to check blood sugar daily Dx e11.9 05/16/14   Biagio Borg, MD  hydrochlorothiazide (MICROZIDE) 12.5 MG capsule Take 1 capsule (12.5 mg total) by mouth daily. 03/01/18 03/01/19  Biagio Borg, MD  hydrocortisone (ANUSOL-HC) 2.5 % rectal cream Place rectally 2 (two) times daily. 12/27/16   Reyne Dumas, MD  irbesartan (AVAPRO) 300 MG tablet TAKE 1 TABLET BY MOUTH EVERY NIGHT AT BEDTIME 01/08/18   Biagio Borg, MD  latanoprost (XALATAN) 0.005 % ophthalmic solution Place 1 drop into both eyes at bedtime. 09/11/17   [provider]  losartan (COZAAR) 100 MG tablet Take 1 tablet (100 mg total) by mouth daily. 03/01/18   Biagio Borg, MD    meclizine (ANTIVERT) 12.5 MG tablet Take 1 tablet (12.5 mg total) by mouth 3 (three) times daily as needed for dizziness. 08/08/17   Biagio Borg, MD  metoprolol succinate (TOPROL-XL) 100 MG 24 hr tablet TAKE 1 TABLET BY MOUTH EVERY DAY 07/31/17   Biagio Borg, MD  ondansetron (ZOFRAN) 4 MG tablet TAKE 1 TABLET(4 MG) BY MOUTH EVERY 8 HOURS AS NEEDED FOR NAUSEA OR VOMITING 08/07/17   Biagio Borg, MD  [facility] DELICA LANCETS [id number] MISC USE TO HELP CHECK BLOOD SUGAR DAILY 07/15/14   Biagio Borg, MD  oxcarbazepine (TRILEPTAL) 600 MG tablet Take 600 mg by mouth 2 (two) times daily.    [provider]  potassium chloride (K-DUR,KLOR-CON) 10 MEQ tablet TAKE 2 TABLETS BY MOUTH DAILY 01/04/18   Biagio Borg, MD  topiramate (TOPAMAX) 100 MG tablet TAKE 1 TABLET BY MOUTH EVERY DAY 02/28/18   Biagio Borg, MD    Family History Family History  Problem Relation Age of Onset  . Heart disease Father   . Heart attack Father   . Hypertension Father        before age 71  . Colon cancer Daughter        and bleeding disorder  . Hyperlipidemia Daughter   . Heart disease Daughter   . Cancer Daughter   . Heart attack Sister   . Hyperlipidemia Sister   . Stroke Sister   . Hypertension Sister   . Hyperlipidemia Brother   . Hypertension Brother   . Diabetes Paternal Grandmother   . Clotting disorder Daughter   . Clotting disorder Other        granddaughter  . Hypertension Son     Social History Social History   Tobacco Use  . Smoking status: Former Smoker    Packs/day: 0.25    Years: 20.00    Pack years: 5.00    Types: Cigarettes    Last attempt to quit: 01/24/1998    Years since quitting: 20.1  . Smokeless tobacco: Never Used  . Tobacco comment: Quit smoking 2001. Smoked on and off for 20 years. Smoked 2-3 cigars daily  Substance Use Topics  . Alcohol use: No    Alcohol/week: 0.0 standard drinks  . Drug use: No     Allergies   Dairy aid [lactase]; Metformin and related; Ace  inhibitors; Aspirin; Codeine; and Soy allergy   Review of Systems Review of Systems  Gastrointestinal: Positive for abdominal pain, nausea and vomiting.  All  other systems reviewed and are negative.    Physical Exam Updated Vital Signs BP (!) 175/85 (BP Location: Right Arm)   Pulse 75   Temp 99 F (37.2 C) (Oral)   Resp 20   Ht 5' 9"$  (1.753 m)   Wt 78.9 kg   SpO2 94%   BMI 25.70 kg/m   Physical Exam Vitals signs and nursing note reviewed.  Constitutional:      Appearance: She is well-developed.  HENT:     Head: Normocephalic and atraumatic.  Eyes:     Conjunctiva/sclera: Conjunctivae normal.     Pupils: Pupils are equal, round, and reactive to light.  Neck:     Musculoskeletal: Normal range of motion.  Cardiovascular:     Rate and Rhythm: Normal rate and regular rhythm.     Heart sounds: Normal heart sounds.  Pulmonary:     Effort: Pulmonary effort is normal.     Breath sounds: Normal breath sounds.  Abdominal:     General: Bowel sounds are decreased. There is distension.     Palpations: Abdomen is soft.     Tenderness: There is generalized abdominal tenderness.     Comments: Soft, generalized tenderness but a little worse periumbilically, appears mildly distended, bowel sound decreased  Musculoskeletal: Normal range of motion.  Skin:    General: Skin is warm and dry.  Neurological:     Mental Status: She is alert and oriented to person, place, and time.      ED Treatments / Results  Labs (all labs ordered are listed, but only abnormal results are displayed) Labs Reviewed  CBC WITH DIFFERENTIAL/PLATELET - Abnormal; Notable for the following components:      Result Value   RBC 5.94 (*)    MCV 76.9 (*)    MCH 23.9 (*)    All other components within normal limits  COMPREHENSIVE METABOLIC PANEL  LIPASE, BLOOD  URINALYSIS, ROUTINE W REFLEX MICROSCOPIC    EKG EKG Interpretation  Date/Time:  Sunday March 25 2018 03:01:08 EST Ventricular Rate:  74 PR  Interval:    QRS Duration: 202 QT Interval:  478 QTC Calculation: 531 R Axis:   -76 Text Interpretation:  Atrial-sensed ventricular-paced rhythm When compared with ECG of 02/27/2014,  Atrial-sensed ventricular-paced rhythm is now present Confirmed by Delora Fuel ([id number]) on 03/25/2018 3:15:22 AM   Radiology Ct Abdomen Pelvis W Contrast  Result Date: 03/25/2018 CLINICAL DATA:  Progressively worsened abdominal pain during the past 4 days. EXAM: CT ABDOMEN AND PELVIS WITH CONTRAST TECHNIQUE: Multidetector CT imaging of the abdomen and pelvis was performed using the standard protocol following bolus administration of intravenous contrast. CONTRAST:  122m OMNIPAQUE IOHEXOL 300 MG/ML  SOLN COMPARISON:  CT abdomen pelvis-01/21/2016; 08/29/2015; 12/09/2013 FINDINGS: Lower chest: Limited visualization of the lower thorax demonstrates minimal dependent subpleural ground-glass atelectasis. No discrete focal airspace opacities. Cardiomegaly. Pacer leads terminate within the right atrium and ventricle. No pericardial effusion. Hepatobiliary: Normal hepatic contour. There is mild diffuse decreased attenuation of the hepatic parenchyma on this postcontrast examination suggestive of hepatic steatosis. No discrete hepatic lesions. Normal appearance of the gallbladder given degree distention. No radiopaque gallstones. No intra or extrahepatic biliary dilatation. No ascites. Pancreas: Normal appearance of the pancreas. Spleen: Normal appearance of the spleen. Note is made of a small splenule. Adrenals/Urinary Tract: There is symmetric enhancement and excretion of the bilateral kidneys. Note is made of an approximately 1.4 cm hypoattenuating left-sided renal cyst. Additional left-sided hypoattenuating renal lesions are too small to adequately characterize  though appears similar to the 12/2015 examination and favored to represent additional renal cysts. No discrete right-sided renal lesions. No definite renal stones this  postcontrast examination. No urine obstruction or perinephric stranding. Normal appearance of the bilateral adrenal glands. Normal appearance of the urinary bladder given degree distention. Stomach/Bowel: Rather extensive near pan colonic diverticulosis without definitive evidence of superimposed acute diverticulitis. There are several loops of nondilated small bowel contained within a wide neck large right lower abdominal quadrant hernia which measures approximately 15.8 x 5.6 x 9.1 cm (coronal image 54, series 6; axial image 76, series 3), grossly unchanged since at least the 09/18/2015 examination. No enteric obstruction. Note is made two adjacent duodenal diverticuli involving the proximal / descending duodenum. No pneumoperitoneum, pneumatosis or portal venous gas. Vascular/Lymphatic: Atherosclerotic plaque within a normal caliber abdominal aorta. No known approximately 1.1 x 1.1 cm right renal artery aneurysm appears similar to at least the 11/2013 examination. The major branch vessels of the abdominal aorta appear patent on this non CTA examination. No bulky retroperitoneal, mesenteric, pelvic or inguinal lymphadenopathy. Reproductive: Post hysterectomy. Dystrophic calcifications within left ovary/adnexa, unchanged. No discrete adnexal lesion. No free fluid within the pelvic cul-de-sac. Other: Unchanged wide-mouth right lower abdominal/pelvic wall hernia as detailed above. Musculoskeletal: No acute or aggressive osseous abnormalities. Bilateral facet degenerative change of the lower lumbar spine. IMPRESSION: 1. Near pan colonic diverticulosis without evidence of superimposed acute diverticulitis. Otherwise, no definite explanation for patient's worsening generalized abdominal pain. 2. Unchanged large (approximately 15.8 cm) wide-mouth right ventral lower abdominal wall hernia which is again noted to contain several loops of nondilated small bowel, similar to at least the 08/2015 examination, and again not  resulting in enteric obstruction. 3. Cardiomegaly. 4. Suspected hepatic steatosis. 5. Aortic Atherosclerosis (ICD10-I70.0). 6. 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 Electronically Signed   By: Sandi Mariscal M.D.   On: 03/25/2018 04:33    Procedures Procedures (including critical care time)  Medications Ordered in ED Medications  morphine 4 MG/ML injection 4 mg (4 mg Intravenous Given 03/25/18 0323)  ondansetron (ZOFRAN) injection 4 mg (4 mg Intravenous Given 03/25/18 0324)  sodium chloride 0.9 % bolus 500 mL (0 mLs Intravenous Stopped 03/25/18 0513)  iohexol (OMNIPAQUE) 300 MG/ML solution 100 mL (100 mLs Intravenous Contrast Given 03/25/18 0411)     Initial Impression / Assessment and Plan / ED Course  I have reviewed the triage vital signs and the nursing notes.  Pertinent labs & imaging results that were available during my care of the patient were reviewed by me and considered in my medical decision making (see chart for details).  68 year old female here with generalized abdominal pain for the past 4 days.  She reports associated nausea, vomiting, and change in bowel habits.  She is afebrile and nontoxic in appearance.  Her abdomen is soft with some generalized tenderness but worse in her periumbilical region.  No peritoneal signs appreciated.  Labs are overall reassuring.  Alk phos is elevated, however this appears chronic compared with prior values.  CT scan was obtained with multiple chronic findings (fatty liver, hernia, renal artery aneurysm), but no acute process identified.  Patient feeling better after IVF and medications.  No further emesis.  She is drinking coca-cola without issue.  Given negative work-up, feel she is stable for discharge home.  Will have her follow-up with her gastroenterologist.  Rx Zofran.  She will return here for any new  or acute changes.  Final Clinical Impressions(s)  / ED Diagnoses   Final diagnoses:  Generalized abdominal pain  Non-intractable vomiting with nausea, unspecified vomiting type    ED Discharge Orders         Ordered    ondansetron (ZOFRAN ODT) 4 MG disintegrating tablet  Every 8 hours PRN     03/25/18 0628           Larene Pickett, PA-C [id number]    Delora Fuel, MD [id number] [phone]

## 2018-03-25 NOTE — ED Notes (Signed)
Pt discharged from ED; instructions provided and scripts given; Pt encouraged to return to ED if symptoms worsen and to f/u with PCP; Pt verbalized understanding of all instructions 

## 2018-03-25 NOTE — Discharge Instructions (Signed)
Take the prescribed medication as directed for nausea.  Recommend gentle diet for now, progress back to normal as tolerated. Follow-up with your primary care doctor.  Would recommend re-check with your GI physician if symptoms persist. Return to the ED for new or worsening symptoms.

## 2018-03-25 NOTE — ED Triage Notes (Signed)
Pt arrived via [facility]; pt from [facility] with abd pain that has progressively gotten worse x last 4 days; pt states pain in lower quads and is sharp and feels like  a "band is wrapped around it." Pt c/o jaw pain as well that is unrelated to abd pain; pt has pacemaker in paced rhythm; 190/90, 72, 94% on 4L, CBG 226

## 2018-03-27 ENCOUNTER — Encounter: Payer: Self-pay | Admitting: Internal Medicine

## 2018-03-27 ENCOUNTER — Other Ambulatory Visit (INDEPENDENT_AMBULATORY_CARE_PROVIDER_SITE_OTHER): Payer: PPO

## 2018-03-27 ENCOUNTER — Ambulatory Visit (INDEPENDENT_AMBULATORY_CARE_PROVIDER_SITE_OTHER): Payer: PPO | Admitting: Internal Medicine

## 2018-03-27 ENCOUNTER — Encounter: Payer: Self-pay | Admitting: Cardiology

## 2018-03-27 VITALS — BP 142/82 | HR 76 | Temp 98.4°F | Ht 69.0 in | Wt 176.0 lb

## 2018-03-27 DIAGNOSIS — R1010 Upper abdominal pain, unspecified: Secondary | ICD-10-CM

## 2018-03-27 DIAGNOSIS — I10 Essential (primary) hypertension: Secondary | ICD-10-CM

## 2018-03-27 DIAGNOSIS — E119 Type 2 diabetes mellitus without complications: Secondary | ICD-10-CM

## 2018-03-27 LAB — H. PYLORI ANTIBODY, IGG: H Pylori IgG: NEGATIVE

## 2018-03-27 MED ORDER — PROCHLORPERAZINE MALEATE 10 MG PO TABS: 10.0000 mg | ORAL_TABLET | Freq: Four times a day (QID) | ORAL | 1 refills | Status: DC | PRN

## 2018-03-27 MED ORDER — PANTOPRAZOLE SODIUM 40 MG PO TBEC: 40.0000 mg | DELAYED_RELEASE_TABLET | Freq: Every day | ORAL | 3 refills | Status: DC

## 2018-03-27 NOTE — Assessment & Plan Note (Signed)
stable overall by history and exam, recent data reviewed with pt, and pt to continue medical treatment as before,  to f/u any worsening symptoms or concerns  

## 2018-03-27 NOTE — Patient Instructions (Signed)
Please take all new medication as prescribed - the protonix for antacid, compazine for nausea  Please continue all other medications as before, and refills have been done if requested.  Please have the pharmacy call with any other refills you may need.  Please continue your efforts at being more active, low cholesterol diet, and weight control.  You are otherwise up to date with prevention measures today.  Please keep your appointments with your specialists as you may have planned - GI on mar 20  Please go to the LAB in the Basement (turn left off the elevator) for the tests to be done today - just the H pylori blood test today

## 2018-03-27 NOTE — Progress Notes (Signed)
Remote pacemaker transmission.   

## 2018-03-27 NOTE — Progress Notes (Signed)
Subjective:    Patient ID: Stacey [name], female    DOB: 1950-06-19, 68 y.o.   MRN: [id number]    HPI  Here after seen in ED 3/1 with 4 days of upper abd pain, "belt like" worse on the sides, without radation, assoc with nausea (no vomiting) but antiemetic not working well.  No fever, States slight worsening reflux, lower abd pain, dysphagia, bowel change or blood, but has low appetite and bloating.  No back pain, Denies urinary symptoms such as dysuria, frequency, urgency, flank pain, hematuria or n/v, fever, chills. Shes wondering about a  Medication side effect.  Called GI but unable to see PA or MD (dr Carlean Purl) until earliest.  Last colonoscopy Dec 28 2016 with diverticulosis  She thinks she has had an endosopy most likely last dec 2019 but I cannot find record of any prior EGD.  Antacid seemed to make things worse.  Diet no change and no making better or worse.  Takes tylenol only for pain, no NSAIDS, no ETOH.  No hx of H pylori.  Started Trulicity Dec 24 but did not take right away due to cost. Started trulicity jan 6 and GI upset until late feb as above. CT in ED neg for acute Past Medical History:  Diagnosis Date  . ANGIOEDEMA 03/07/2008   a. with ACE-I  . ANXIETY 09/16/2006  . ASTHMA 08/01/2006  . CAD (coronary artery disease)    a. 01/2010 : Minimal plaque at cardiac catheterization - CFX 20%, EF 70%;  b. 08/2012 Cath: Nl Cors, EF 65%.  . Chronic diastolic CHF (congestive heart failure) ([place]) 04/06/2010   a. In setting of HOCM.  Marland Kitchen Complete heart block ([place]) 02/26/2014   a. s/p MDT dual chamber pacemaker 02/2014  . COPD (chronic obstructive pulmonary disease) ([place])   . DEPRESSION 09/16/2006  . DIVERTICULOSIS, COLON, WITH HEMORRHAGE 04/06/2010  . DM (diabetes mellitus) in pregnancy, delivered w/postpartum condition 08/30/2010  . HYPERLIPIDEMIA 08/01/2006  . HYPERTENSION 08/01/2006  . 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 s/p septal myomectomy 4/12 at [place] with Dr. Evelina Dun;  b. echo 5/12:  EF 60-65%, LVOT peak 18 mmHg; grade 1 diast dysfxn, mild SAM (improved since myomectomy;  c. 03/2012 Echo: EF 60-65%, mod-sev basal septal asymm hypertrophy, basal septal HK, LVOT grad 65mHg, Gr 1 DD, , SAM, Mild MR, nl RV, PASP 342mg; 08/2012 Echo: technically difficult, doubt LVOT obstruction, EF 60%, Gr 1 DD, mod-sev dil LA.  . Impaired glucose tolerance 06/25/2010  . LBBB (left bundle branch block) 06/17/2010  . OBESITY 09/16/2006  . Renal artery aneurysm ([facility]  . SICKLE CELL ANEMIA 08/01/2006  . Type II or unspecified type diabetes mellitus without mention of complication, uncontrolled 08/30/2010  . VENTRAL HERNIA 12/07/2006   Past Surgical History:  Procedure Laterality Date  . ABDOMINAL HYSTERECTOMY  1987  . CARDIAC SURGERY    . COLONOSCOPY N/A 12/26/2016   Procedure: COLONOSCOPY;  Surgeon: JaMilus BanisterMD;  Location: [facility];  Service: Endoscopy;  Laterality: N/A;  . ELECTROPHYSIOLOGY STUDY N/A 09/12/2012   Procedure: ELECTROPHYSIOLOGY STUDY;  Surgeon: StDeboraha SprangMD;  [facility];  Service: Cardiovascular;  Laterality: N/A;  . LEFT HEART CATH  Aug. 18, 2014   Medtronic heart device  . LEFT HEART CATHETERIZATION WITH CORONARY ANGIOGRAM N/A 09/10/2012   Procedure: LEFT HEART CATHETERIZATION WITH CORONARY ANGIOGRAM;  Surgeon: ChBurnell BlanksMD;  [facility];  Service: Cardiovascular;  Laterality: N/A;  . MYOMECTOMY  Septal  . PERMANENT PACEMAKER INSERTION N/A 02/26/2014   MDT Advisa dual chamber pacemaker implanted by Dr Caryl Comes for heart block  . VENTRAL HERNIA REPAIR  06/2006    reports that she quit smoking about 20 years ago. Her smoking use included cigarettes. She has a 5.00 pack-year smoking history. She has never used smokeless tobacco. She reports that she does not drink alcohol or use drugs. family history includes Cancer in her daughter; Clotting disorder in her daughter and another family member; Colon cancer in her daughter; Diabetes in  her paternal grandmother; Heart attack in her father and sister; Heart disease in her daughter and father; Hyperlipidemia in her brother, daughter, and sister; Hypertension in her brother, father, sister, and son; Stroke in her sister. Allergies  Allergen Reactions  . Dairy Aid [Lactase] Diarrhea and Other (See Comments)    Bloating and gastric distress  . Metformin And Related Other (See Comments)    GI upset  . Ace Inhibitors Other (See Comments)    REACTION: angioedema right eye  . Aspirin Other (See Comments)    Bleeding   . Codeine Other (See Comments)    Hallucinations, can take if with someone.  . Soy Allergy Other (See Comments)    Bleeding & cramps, diarrhea    Current Outpatient Medications on File Prior to Visit  Medication Sig Dispense Refill  . acetaminophen (TYLENOL) 500 MG tablet Take 500 mg by mouth daily as needed.    Marland Kitchen albuterol (PROVENTIL HFA;VENTOLIN HFA) 108 (90 BASE) MCG/ACT inhaler Inhale 2 puffs into the lungs every 6 (six) hours as needed for wheezing or shortness of breath. 18 g 5  . amLODipine (NORVASC) 10 MG tablet TAKE 1 TABLET BY MOUTH EVERY DAY 90 tablet 3  . atorvastatin (LIPITOR) 40 MG tablet TAKE 1 TABLET(40 MG) BY MOUTH DAILY 90 tablet 3  . Blood Glucose Monitoring Suppl (ONE TOUCH ULTRA 2) W/DEVICE KIT Use to check blood sugars daily Dx E11.9 1 each 0  . cetirizine (ZYRTEC) 10 MG tablet Take 10 mg by mouth daily.      . clonazePAM (KLONOPIN) 0.5 MG tablet Take 1 tablet (0.5 mg total) by mouth 2 (two) times daily as needed for anxiety. 30 tablet 0  . Dulaglutide (TRULICITY) 7.12 WP/8.0DX SOPN Inject 0.5 mLs into the skin once a week. 6 mL 3  . fenofibrate (TRICOR) 145 MG tablet Take 1 tablet (145 mg total) by mouth daily. 90 tablet 3  . glipiZIDE (GLUCOTROL XL) 10 MG 24 hr tablet TAKE 1 TABLET BY MOUTH EVERY DAY WITH BREAKFAST 90 tablet 0  . glucose blood test strip 1 each by Other route daily. Use to check blood sugar daily Dx e11.9 100 each 11  .  hydrochlorothiazide (MICROZIDE) 12.5 MG capsule Take 1 capsule (12.5 mg total) by mouth daily. 90 capsule 3  . hydrocortisone (ANUSOL-HC) 2.5 % rectal cream Place rectally 2 (two) times daily. 30 g 1  . irbesartan (AVAPRO) 300 MG tablet TAKE 1 TABLET BY MOUTH EVERY NIGHT AT BEDTIME 90 tablet 1  . latanoprost (XALATAN) 0.005 % ophthalmic solution Place 1 drop into both eyes at bedtime.  3  . losartan (COZAAR) 100 MG tablet Take 1 tablet (100 mg total) by mouth daily. 90 tablet 3  . meclizine (ANTIVERT) 12.5 MG tablet Take 1 tablet (12.5 mg total) by mouth 3 (three) times daily as needed for dizziness. 40 tablet 2  . metoprolol succinate (TOPROL-XL) 100 MG 24 hr tablet TAKE 1 TABLET BY MOUTH  EVERY DAY 90 tablet 3  . ondansetron (ZOFRAN ODT) 4 MG disintegrating tablet Take 1 tablet (4 mg total) by mouth every 8 (eight) hours as needed for nausea. 10 tablet 0  . ONETOUCH DELICA LANCETS 24M MISC USE TO HELP CHECK BLOOD SUGAR DAILY 200 each 0  . oxcarbazepine (TRILEPTAL) 600 MG tablet Take 600 mg by mouth 2 (two) times daily.    . potassium chloride (K-DUR,KLOR-CON) 10 MEQ tablet TAKE 2 TABLETS BY MOUTH DAILY 180 tablet 0  . topiramate (TOPAMAX) 100 MG tablet TAKE 1 TABLET BY MOUTH EVERY DAY 90 tablet 0   No current facility-administered medications on file prior to visit.    Review of Systems  Constitutional: Negative for other unusual diaphoresis or sweats HENT: Negative for ear discharge or swelling Eyes: Negative for other worsening visual disturbances Respiratory: Negative for stridor or other swelling  Gastrointestinal: Negative for worsening distension or other blood Genitourinary: Negative for retention or other urinary change Musculoskeletal: Negative for other MSK pain or swelling Skin: Negative for color change or other new lesions Neurological: Negative for worsening tremors and other numbness  Psychiatric/Behavioral: Negative for worsening agitation or other fatigue All other system  neg per pt    Objective:   Physical Exam BP (!) 142/82   Pulse 76   Temp 98.4 F (36.9 C) (Oral)   Ht _0  (1.753 m)   Wt 176 lb (79.8 kg)   SpO2 91%   BMI 25.99 kg/m  VS noted,  Constitutional: Pt appears in NAD HENT: Head: NCAT.  Right Ear: External ear normal.  Left Ear: External ear normal.  Eyes: . Pupils are equal, round, and reactive to light. Conjunctivae and EOM are normal Nose: without d/c or deformity Neck: Neck supple. Gross normal ROM Cardiovascular: Normal rate and regular rhythm.   Pulmonary/Chest: Effort normal and breath sounds without rales or wheezing.  Abd:  Soft, ND, + BS, no organomegaly, with bilat upper and epigastric tender mild to mod it seems, no guarding or rebound, and no convincing costal margin bony tenderness Neurological: Pt is alert. At baseline orientation, motor grossly intact Skin: Skin is warm. No rashes, other new lesions, no LE edema Psychiatric: Pt behavior is normal without agitation  Lab Results  Component Value Date   WBC 7.2 03/25/2018   HGB 14.2 03/25/2018   HCT 45.7 03/25/2018   PLT 204 03/25/2018   GLUCOSE 247 (H) 03/25/2018   CHOL 194 01/11/2018   TRIG (H) 01/11/2018    460.0 Triglyceride is over 400; calculations on Lipids are invalid.   HDL 45.30 01/11/2018   LDLDIRECT 81.0 01/11/2018   LDLCALC 79 09/11/2012   ALT 37 03/25/2018   AST 41 03/25/2018   NA 136 03/25/2018   K 3.8 03/25/2018   CL 100 03/25/2018   CREATININE 0.89 03/25/2018   BUN 19 03/25/2018   CO2 24 03/25/2018   TSH 1.10 07/12/2017   INR 1.35 12/23/2016   HGBA1C 10.1 (H) 01/11/2018   MICROALBUR 103.6 (H) 07/12/2017          Assessment & Plan:

## 2018-03-27 NOTE — Assessment & Plan Note (Signed)
Etiology unclear, ? Gastritis vs PUD vs other - for protonix 40 qd, cont to avoid nsaids and ETOH. Compazine prn nausea (has difficulty tolerating phenergan). To check H pylori Ab, and f/u GI as planned 3/20, I suspect will warrant EGD

## 2018-03-28 ENCOUNTER — Other Ambulatory Visit: Payer: Self-pay

## 2018-03-28 NOTE — Patient Outreach (Signed)
  [facility]) Care Management Chronic Special Needs Program  03/28/2018  Name: Stacey [name] DOB: Jan 08, 1951  MRN: [id number]  Stacey [name] is enrolled in a chronic special needs plan for  Diabetes. A completed health risk assessment has not been received from the client and client has not responded to outreach attempts by their health care concierge.  The client's individualized care plan was developed based on available data.  Plan:  . Send unsuccessful outreach letter with a copy of individualized care plan to client . Send individualized care plan to provider . Send Education related to history of heart failure, hypertension, heart disease  Chronic care management coordinator will attempt outreach in 2-4 months.    Kathyrn Sheriff, RN, MSN, [facility] Chronic Care Management Coordinator [facility] [phone]

## 2018-04-08 ENCOUNTER — Other Ambulatory Visit: Payer: Self-pay | Admitting: Internal Medicine

## 2018-04-09 ENCOUNTER — Other Ambulatory Visit: Payer: Self-pay

## 2018-04-09 MED ORDER — POTASSIUM CHLORIDE CRYS ER 10 MEQ PO TBCR: 20.0000 meq | EXTENDED_RELEASE_TABLET | Freq: Every day | ORAL | 0 refills | Status: DC

## 2018-04-09 NOTE — Telephone Encounter (Signed)
Done erx 

## 2018-04-12 ENCOUNTER — Telehealth: Payer: Self-pay

## 2018-04-12 NOTE — Telephone Encounter (Signed)
Covid-19 travel screening questions  Have you traveled in the last 14 days? If yes where? No  Do you now or have you had a fever in the last 14 days? No  Do you have any respiratory symptoms of shortness of breath or cough now or in the last 14 days? No  Do you have a medical history of Congestive Heart Failure?   Do you have a medical history of lung disease?  Do you have any family members or close contacts with diagnosed or suspected Covid-19? No

## 2018-04-13 ENCOUNTER — Other Ambulatory Visit: Payer: PPO

## 2018-04-13 ENCOUNTER — Ambulatory Visit (INDEPENDENT_AMBULATORY_CARE_PROVIDER_SITE_OTHER): Payer: PPO | Admitting: Nurse Practitioner

## 2018-04-13 ENCOUNTER — Encounter: Payer: Self-pay | Admitting: Nurse Practitioner

## 2018-04-13 ENCOUNTER — Other Ambulatory Visit: Payer: Self-pay

## 2018-04-13 ENCOUNTER — Encounter (INDEPENDENT_AMBULATORY_CARE_PROVIDER_SITE_OTHER): Payer: Self-pay

## 2018-04-13 VITALS — BP 134/86 | HR 66 | Temp 98.5°F | Ht 66.5 in | Wt 177.2 lb

## 2018-04-13 DIAGNOSIS — R1010 Upper abdominal pain, unspecified: Secondary | ICD-10-CM

## 2018-04-13 DIAGNOSIS — R748 Abnormal levels of other serum enzymes: Secondary | ICD-10-CM

## 2018-04-13 DIAGNOSIS — K760 Fatty (change of) liver, not elsewhere classified: Secondary | ICD-10-CM

## 2018-04-13 NOTE — Patient Instructions (Signed)
If you are age 67 or older, your body mass index should be between 23-30. Your Body mass index is 28.18 kg/m. If this is out of the aforementioned range listed, please consider follow up with your Primary Care Provider.  If you are age 59 or younger, your body mass index should be between 19-25. Your Body mass index is 28.18 kg/m. If this is out of the aformentioned range listed, please consider follow up with your Primary Care Provider.   Your provider has requested that you go to the basement level for lab work before leaving today. Press "B" on the elevator. The lab is located at the first door on the left as you exit the elevator. AMA  Continue Protonix until first week of May.  Follow up with me  Mid May.  The schedule is not available at this time.  Please call the office first of May to schedule an appointment.  Thank you for choosing me and [facility].   Willette Cluster, NP  To help prevent the possible spread of infection to our patients, communities, and staff; we will be implementing the following measures:  Please only allow one visitor/family member to accompany you to any upcoming appointments with [facility]. If you have any concerns about this please contact our office to discuss prior to the appointment.

## 2018-04-13 NOTE — Progress Notes (Signed)
Chief Complaint:    Abdominal pain  IMPRESSION and PLAN:     79. 68 yo female recently evaluated at ED for diffuse mid abdominal pain / nausea.  Labs and CTAP with contrast in ED were unrevealing.  She is feeling better. PCP started PPI and compazine on 03/27/18.  -Since symptoms resolving will not proceed with further testing. If recurrent symptoms consider EGD.  -Started Trulicity in January, doubt cause for GI symptoms, especially since improving.   -Continue PPI for 8 weeks then can stop as patient has no history of GERD. She will follow up with me afterwards. She knows to call sooner if pain recurs.   2. Large RLQ ventral hernia on CT scan. Contains non-dilated small bowel on CT scan. Stable compared to imaging Aug 2017.   3.  DM2.  Hemoglobin A1c 10 in mid December 2019.  Hyperglycemia could certainly is on delayed gastric emptying/nausea. -Patient says she has actually made some dietary changes.  She is making great effort to significantly reduce carb intake.  4.  Elevated alkaline phosphatase.  Alk phos chronically elevated between 140's and 160's.  Remainder of liver test normal no biliary duct dilation on CT scan.  -Obtain AMA -If AMA negative then consider fractioning alk phos on follow up labs at next visit.   5.  Fatty liver disease.  Patient is aware that she has this.  -We discussed importance of better glucose control as well as weight management  6.  Microcytosis without anemia, chronic and stable.  7. [facility] of colon cancer. A five year recall colonoscopy recommended at time of colonoscopy Oct 2015 but she had one December 2018 ( for bleeding) so new recall now 2023  HPI:     Patient is a 68 yo female with a history of DM2, CAD, chronic diastolic CHF, pacemaker,  diverticulosis / diverticular bleed. She is here today for evaluation of abdominal pain   03/25/18 -Patient in the ED with 3-4 days history of diffuse mid abdominal pain and nausea.  Pain felt like  something inside was "slicing and dicing" her. Pain radiated across whole abdomen but not into her back. She has associated fullness and bloating   Symptoms were unrelated to eating or activity. Her bowel movements were fine.  No NSAID use. Labs in ED were unrevealing. No acute findings on CTAP with contrast.   ED evaluation:   WBC 7.2, hemoglobin 14.2, MCV 76.9, platelets 204 Glucose 247, alkaline phosphatase 167, AST 41, ALT 37, total bilirubin 0.4 Lipase 44  CTAP with contrast IMPRESSION: 1. Near pan colonic diverticulosis without evidence of superimposed acute diverticulitis. Otherwise, no definite explanation forpatient's worsening generalized abdominal pain. 2. Unchanged large (approximately 15.8 cm) wide-mouth right ventral lower abdominal wall hernia which is again noted to contain several loops of nondilated small bowel, similar to at least the 08/2015 examination, and again not resulting in enteric obstruction. 3. Cardiomegaly. 4. Suspected hepatic steatosis. 5. Aortic Atherosclerosis (ICD10-I70.0). 6. 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  Rx for compazine and Protonix.  Patient is feeling better. Compliant with daily Protonix. PCP gave her something to help with anxiety / rest. She is taking care of husband with a rare type of stage IV cancer.     Review of systems:     No chest pain, no SOB,  no fevers  Past Medical History:  Diagnosis Date  . ANGIOEDEMA 03/07/2008   a. with ACE-I  . ANXIETY 09/16/2006  . ASTHMA 08/01/2006  . CAD (coronary artery disease)    a. 01/2010 : Minimal plaque at cardiac catheterization - CFX 20%, EF 70%;  b. 08/2012 Cath: Nl Cors, EF 65%.  . Chronic diastolic CHF (congestive heart failure) ([place]) 04/06/2010   a. In setting of HOCM.  Marland Kitchen Complete heart block ([place]) 02/26/2014   a. s/p MDT dual  chamber pacemaker 02/2014  . COPD (chronic obstructive pulmonary disease) ([place])   . DEPRESSION 09/16/2006  . DIVERTICULOSIS, COLON, WITH HEMORRHAGE 04/06/2010  . DM (diabetes mellitus) in pregnancy, delivered w/postpartum condition 08/30/2010  . HYPERLIPIDEMIA 08/01/2006  . HYPERTENSION 08/01/2006  . 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 s/p septal myomectomy 4/12 at [place] with Dr. Evelina Dun;  b. echo 5/12: EF 60-65%, LVOT peak 18 mmHg; grade 1 diast dysfxn, mild SAM (improved since myomectomy;  c. 03/2012 Echo: EF 60-65%, mod-sev basal septal asymm hypertrophy, basal septal HK, LVOT grad 56mHg, Gr 1 DD, , SAM, Mild MR, nl RV, PASP 395mg; 08/2012 Echo: technically difficult, doubt LVOT obstruction, EF 60%, Gr 1 DD, mod-sev dil LA.  . Impaired glucose tolerance 06/25/2010  . LBBB (left bundle branch block) 06/17/2010  . OBESITY 09/16/2006  . Renal artery aneurysm ([facility]  . SICKLE CELL ANEMIA 08/01/2006  . Type II or unspecified type diabetes mellitus without mention of complication, uncontrolled 08/30/2010  . VENTRAL HERNIA 12/07/2006    Patient's surgical history, family medical history, social history, medications and allergies were all reviewed in Epic   Serum creatinine: 0.89 mg/dL 03/25/18 0302 Estimated creatinine clearance: 67.2 mL/min  Current Outpatient Medications  Medication Sig Dispense Refill  . acetaminophen (TYLENOL) 500 MG tablet Take 500 mg by mouth daily as needed.    . Marland Kitchenlbuterol (PROVENTIL HFA;VENTOLIN HFA) 108 (90 BASE) MCG/ACT inhaler Inhale 2 puffs into the lungs every 6 (six) hours as needed for wheezing or shortness of breath. 18 g 5  . amLODipine (NORVASC) 10 MG tablet TAKE 1 TABLET BY MOUTH EVERY DAY 90 tablet 3  . atorvastatin (LIPITOR) 40 MG tablet TAKE 1 TABLET(40 MG) BY MOUTH DAILY 90 tablet 3  . Blood Glucose Monitoring Suppl (ONE TOUCH ULTRA 2) W/DEVICE KIT Use to check blood sugars daily Dx E11.9 1 each 0  . cetirizine (ZYRTEC) 10 MG tablet Take 10 mg by mouth daily.      .  clonazePAM (KLONOPIN) 0.5 MG tablet TAKE 1 TABLET BY MOUTH TWICE DAILY AS NEEDED FOR ANXIETY 30 tablet 2  . Dulaglutide (TRULICITY) 0.7.51GZG/0.1VCOPN Inject 0.5 mLs into the skin once a week. 6 mL 3  . fenofibrate (TRICOR) 145 MG tablet Take 1 tablet (145 mg total) by mouth daily. 90 tablet 3  . glipiZIDE (GLUCOTROL XL) 10 MG 24 hr tablet TAKE 1 TABLET BY MOUTH EVERY DAY WITH BREAKFAST 90 tablet 0  . glucose blood test strip 1 each by Other route daily. Use to check blood sugar daily Dx e11.9 100 each 11  . hydrochlorothiazide (MICROZIDE) 12.5 MG capsule Take 1 capsule (12.5 mg total) by mouth daily. 90 capsule 3  . hydrocortisone (ANUSOL-HC) 2.5 % rectal cream Place rectally 2 (two) times daily. 30 g 1  . irbesartan (AVAPRO) 300 MG tablet TAKE 1 TABLET BY MOUTH EVERY NIGHT AT BEDTIME 90 tablet 1  . latanoprost (XALATAN) 0.005 % ophthalmic solution Place 1 drop into both eyes at bedtime.  3  . losartan (COZAAR) 100 MG tablet Take 1 tablet (100 mg total) by mouth daily. 90 tablet 3  . meclizine (ANTIVERT) 12.5 MG tablet Take 1 tablet (12.5 mg total) by mouth 3 (three) times daily as needed for dizziness. 40 tablet 2  . metoprolol succinate (TOPROL-XL) 100 MG 24 hr tablet TAKE 1 TABLET BY MOUTH EVERY DAY 90 tablet 3  . ondansetron (ZOFRAN ODT) 4 MG disintegrating tablet Take 1 tablet (4 mg total) by mouth every 8 (eight) hours as needed for nausea. 10 tablet 0  . ONETOUCH DELICA LANCETS 63S MISC USE TO HELP CHECK BLOOD SUGAR DAILY 200 each 0  . oxcarbazepine (TRILEPTAL) 600 MG tablet Take 600 mg by mouth 2 (two) times daily.    . pantoprazole (PROTONIX) 40 MG tablet Take 1 tablet (40 mg total) by mouth daily. 90 tablet 3  . potassium chloride (K-DUR,KLOR-CON) 10 MEQ tablet Take 2 tablets (20 mEq total) by mouth daily. 180 tablet 0  . prochlorperazine (COMPAZINE) 10 MG tablet Take 1 tablet (10 mg total) by mouth every 6 (six) hours as needed for nausea or vomiting. 30 tablet 1  . topiramate  (TOPAMAX) 100 MG tablet TAKE 1 TABLET BY MOUTH EVERY DAY 90 tablet 0   No current facility-administered medications for this visit.     Physical Exam:     BP 134/86 (BP Location: Left Arm, Patient Position: Sitting, Cuff Size: Normal)   Pulse 66   Temp 98.5 F (36.9 C)   Ht 5' 6.5" (1.689 m) Comment: height measured without shoes  Wt 177 lb 4 oz (80.4 kg)   BMI 28.18 kg/m   GENERAL:  Pleasant female in NAD PSYCH: : Cooperative, normal affect EENT:  conjunctiva pink, mucous membranes moist, neck supple without masses CARDIAC:  RRR, + murmur, no peripheral edema PULM: Normal respiratory effort, lungs CTA bilaterally, no wheezing ABDOMEN:  Nondistended, soft, nontender.RLQ hernia. Normal bowel sounds SKIN:  turgor, no lesions seen Musculoskeletal:  Normal muscle tone, normal strength NEURO: Alert and oriented x 3, no focal neurologic deficits  I spent 25 minutes of face-to-face time with the patient. Greater than 50% of the time was spent counseling and coordinating care. Questions answered  Tye Savoy , NP 04/13/2018, 9:24 AM

## 2018-04-16 LAB — MITOCHONDRIAL ANTIBODIES: Mitochondrial M2 Ab, IgG: <=20 U

## 2018-05-09 ENCOUNTER — Other Ambulatory Visit: Payer: Self-pay | Admitting: Internal Medicine

## 2018-05-29 ENCOUNTER — Other Ambulatory Visit: Payer: Self-pay

## 2018-05-29 MED ORDER — TOPIRAMATE 100 MG PO TABS: 100.0000 mg | ORAL_TABLET | Freq: Every day | ORAL | 0 refills | Status: DC

## 2018-06-04 ENCOUNTER — Other Ambulatory Visit: Payer: Self-pay

## 2018-06-04 ENCOUNTER — Other Ambulatory Visit: Payer: Self-pay | Admitting: *Deleted

## 2018-06-04 MED ORDER — GLIPIZIDE ER 10 MG PO TB24: ORAL_TABLET | ORAL | 0 refills | Status: DC

## 2018-06-04 NOTE — Patient Outreach (Signed)
  [facility]) Care Management Chronic Special Needs Program    06/04/2018  Name: Stacey [name], DOB: 08/19/1950  MRN: [id number]   Stacey [name] is enrolled in a chronic special needs plan. RNCM called to review health risk assessment and complete individualized care plan. [name] called RNCM back prior to completing this note. She states this was not a good time as her husband is transitioning. She reports that he is active with Hospice and the social worker is assisting her with arrangements to hospice house. RNCM provided support.   RNCM Confirmed that she has RNCM's contact number and encouraged to call as needed.   Plan: Outreach in the next 3-4 weeks.  Stacey Sheriff, RN, MSN, [facility] Chronic Care Management Coordinator [facility] [phone]

## 2018-06-19 ENCOUNTER — Ambulatory Visit (INDEPENDENT_AMBULATORY_CARE_PROVIDER_SITE_OTHER): Payer: PPO | Admitting: *Deleted

## 2018-06-19 DIAGNOSIS — I442 Atrioventricular block, complete: Secondary | ICD-10-CM

## 2018-06-19 DIAGNOSIS — I421 Obstructive hypertrophic cardiomyopathy: Secondary | ICD-10-CM

## 2018-06-20 ENCOUNTER — Telehealth: Payer: Self-pay

## 2018-06-20 LAB — CUP PACEART REMOTE DEVICE CHECK
Battery Remaining Longevity: 70 mo
Battery Voltage: 3 V
Brady Statistic AP VP Percent: 5.89 %
Brady Statistic AP VS Percent: 0 %
Brady Statistic AS VP Percent: 94.04 %
Brady Statistic AS VS Percent: 0.07 %
Brady Statistic RA Percent Paced: 5.89 %
Brady Statistic RV Percent Paced: 99.9 %
Date Time Interrogation Session: 20200527174610
Implantable Lead Implant Date: 20160203
Implantable Lead Implant Date: 20160203
Implantable Lead Location: 753859
Implantable Lead Location: 753860
Implantable Lead Model: 5076
Implantable Lead Model: 5076
Implantable Pulse Generator Implant Date: 20160203
Lead Channel Impedance Value: 342 Ohm
Lead Channel Impedance Value: 456 Ohm
Lead Channel Impedance Value: 475 Ohm
Lead Channel Impedance Value: 532 Ohm
Lead Channel Pacing Threshold Amplitude: 0.625 V
Lead Channel Pacing Threshold Amplitude: 1 V
Lead Channel Pacing Threshold Pulse Width: 0.4 ms
Lead Channel Pacing Threshold Pulse Width: 0.4 ms
Lead Channel Sensing Intrinsic Amplitude: 10.25 mV
Lead Channel Sensing Intrinsic Amplitude: 10.25 mV
Lead Channel Sensing Intrinsic Amplitude: 3 mV
Lead Channel Sensing Intrinsic Amplitude: 3 mV
Lead Channel Setting Pacing Amplitude: 2 V
Lead Channel Setting Pacing Amplitude: 2.5 V
Lead Channel Setting Pacing Pulse Width: 0.4 ms
Lead Channel Setting Sensing Sensitivity: 2.8 mV

## 2018-06-20 NOTE — Telephone Encounter (Signed)
Spoke with patient to remind of missed remote transmission 

## 2018-06-25 ENCOUNTER — Ambulatory Visit: Payer: Self-pay

## 2018-06-27 NOTE — Progress Notes (Signed)
Remote pacemaker transmission.   

## 2018-06-29 ENCOUNTER — Other Ambulatory Visit: Payer: Self-pay

## 2018-06-29 NOTE — Patient Outreach (Signed)
  [facility]) Care Management Chronic Special Needs Program  06/29/2018  Name: Stacey [name] DOB: 05/19/1950  MRN: [id number]  Stacey [name] is enrolled in a Chronic Special Needs Plan. RNCM called to follow up and review individualized care plan. No answer. Unable to leave message.    Plan: Chronic care management coordinator will attempt outreach next week.  Kathyrn Sheriff, RN, MSN, [facility] Chronic Care Management Coordinator [facility] [phone]

## 2018-07-05 ENCOUNTER — Ambulatory Visit: Payer: Self-pay

## 2018-07-05 DIAGNOSIS — K047 Periapical abscess without sinus: Secondary | ICD-10-CM | POA: Diagnosis not present

## 2018-07-11 ENCOUNTER — Other Ambulatory Visit: Payer: Self-pay

## 2018-07-11 NOTE — Patient Outreach (Signed)
[facility]) Care Management Chronic Special Needs Program  07/11/2018  Name: Stacey [name] DOB: 30-Jan-1950  MRN: [id number]  Ms. [place] [name] is enrolled in a chronic special needs plan for Diabetes. Chronic Care Management Coordinator telephoned client to review health risk assessment and to develop individualized care plan.  Introduced the chronic care management program, importance of client participation, and taking their care plan to all provider appointments and inpatient facilities.    Subjective: Client reports recent loss of her husband. She reports they will be having a memorial for him at the end of July. She reports she has a lot of supportive family and is currently doing "ok". PHQ2-2/PHQ9=4. She declines social work referral at this time.  She reports a history of Diabetes, heart failure, atrial fibrillation, hypertension and heart disease.   Client states she is not able to afford Trulicity and reports not taking it at this time. She is receptive to [place] referral. Client also reports a history of heart failure adding that she weighs/records weights daily and has an action plan for when to call the doctor.  Client request RNCM call her back at the end of next month to see if she has any social work needs  Goals Addressed            This Visit's Progress   . Client understands the importance of follow-up with providers by attending scheduled visits   On track    Diabetes self management actions: Glucose monitoring per provider recommendations Eat Healthy Check feet daily Visit provider every 3-6 months as directed Hbg A1C level every 3-6 months. Eye Exam yearly    . Client verbalize knowledge of Heart Failure disease self management skills within 6-9 months.   On track    Take Medications as prescribed. Monitor Salt and fluid volume as recommended. Attend Follow up appointments as scheduled.     . Client verbalizes  knowledge of Heart Attack self management skills within 3 months.   On track   . Client will report abillity to obtain Medications within 6 months.      . Client will report no worsening of symptoms of Atrial Fibrillation within the next 3 months      . Client will report no worsening of symptoms related to heart disease within the next 6-9 months.   On track   . COMPLETED: Client will use Assistive Devices as needed and verbalize understanding of device use   On track    Denies any questions or issues of use with glucometer.    . Client will verbalize knowledge of self management of Hypertension as evidences by BP reading of 140/90 or less; or as defined by provider   On track   . Maintain timely refills of diabetic medication as prescribed within the year .   On track   . Obtain annual  Lipid Profile, LDL-C   On track   . Obtain Annual Eye (retinal)  Exam    On track   . Obtain Annual Foot Exam   On track   . Obtain annual screen for micro albuminuria (urine) , nephropathy (kidney problems)   On track   . Obtain Hemoglobin A1C at least 2 times per year   On track   . Patient Stated   On track    Take travel day trips for my enjoyment, start to go to [employer]. Take a train trip in the future and go to my family reunion in September.  Enjoy my family and grand-children.     . Visit Primary Care Provider or Endocrinologist at least 2 times per year    On track     Covid 19 precautions discussed. RNCM reinforced the 24 hour nurse advice line. RNCM encouraged client to contact health care concierge for benefit and in network provider questions. Client encouraged to call RNCM as needed/Confirmed he has contact number.  RNCM offered support to client in light of recent loss of husband, RNCM encouraged client to seek help if needed with Hospice grief counseling. She reports they have called her and offered this service to her; RNCM also offered [place] work services for resources if needed.  Client declines at this time adding she may need assistance at the end of next month as she states this will be when a lot of her family support will be decreasing.  Plan: RNCM will complete telephone outreach call in 1-2 months to assess if any social work needs.  Will refer client to:  Pharmacy for medication review and assistance.  Thea Silversmith, RN, MSN, [place] [phone]

## 2018-07-12 ENCOUNTER — Other Ambulatory Visit: Payer: Self-pay | Admitting: Pharmacist

## 2018-07-12 ENCOUNTER — Ambulatory Visit: Payer: PPO | Admitting: Internal Medicine

## 2018-07-12 NOTE — Patient Outreach (Addendum)
[facility]) Care Management  [place]   07/12/2018  Itali Mckendry Crawford-Fewell 02-05-1950 [id number]  Reason for referral: Medication Assistance with Trulicity  Referral source: [employer] Care Manager with [facility] Current insurance: [employer]  PMHx includes but not limited to:  T2DM, HTN, HLD, CAD, CHF, pacemaker, diverticulosis, anxiety / depression, COPD, ventral hernia  Outreach:  Unsuccessful telephone call attempt #1 to patient. HIPAA compliant voicemail left requesting a return call  Plan:  -I will mail patient an unsuccessful outreach letter.  -I will make another outreach attempt to patient within 3-4 business days.   Ralene Bathe, PharmD, [place] [phone]    Incoming call and voicemail received from patient.  Return call placed.  Patient unable to review medications but states she can discuss patient assistance program for a few minutes.  Patient preliminarily eligible for Trulicity based on reported income.    I will route patient assistance letter to [facility] pharmacy technician who will coordinate patient assistance program application process for Trulicity.  [facility] pharmacy technician will assist with obtaining all required documents from both patient and provider(s) and submit application(s) once completed.    Will f/u again with patient to review medications next week as scheduled.   Ralene Bathe, PharmD, [place] [phone]

## 2018-07-16 ENCOUNTER — Other Ambulatory Visit: Payer: Self-pay | Admitting: Pharmacy Technician

## 2018-07-16 ENCOUNTER — Other Ambulatory Visit: Payer: Self-pay

## 2018-07-16 MED ORDER — IRBESARTAN 300 MG PO TABS: 300.0000 mg | ORAL_TABLET | Freq: Every day | ORAL | 1 refills | Status: DC

## 2018-07-16 NOTE — Patient Outreach (Signed)
[facility]) Care Management  07/16/2018  Stacey [name] 07-28-1950 [id number]                          Medication Assistance Referral  Referral From: [place]: Trulicity / [facility] Cares  Patient application portion:  Mailed Provider application portion: Faxed  to Dr. Cathlean Cower   Follow up:  Will follow up with patient in 7-10 business days to confirm application(s) have been received.  Maud Deed Chana Bode [place] Certified Pharmacy Technician [place] Management Direct [phone]

## 2018-07-17 ENCOUNTER — Ambulatory Visit: Payer: Self-pay | Admitting: Pharmacist

## 2018-07-17 ENCOUNTER — Other Ambulatory Visit: Payer: Self-pay | Admitting: Pharmacist

## 2018-07-17 NOTE — Patient Outreach (Signed)
[facility]) Care Management  [place]   07/17/2018  Phala Schraeder Crawford-Fewell [birth year] [id number]  Reason for referral: Medication Assistance with Trulicity, Medication Review  Referral source: [employer] C-[employer] Care Manager with [facility] Current insurance: [employer] C-[employer]  PMHx includes but not limited to:  T2DM, HTN, HLD, CAD, CHF, pacemaker, diverticulosis, anxiety / depression, COPD, ventral hernia  Outreach:  Successful telephone call with patient.  HIPAA identifiers verified.   Subjective:  Patient able to review medications telephonically.  She reports she manages her own medications and is adherence to her regimen.  Reports spouse recently passed away.  States she had to miss several doses of Trulicity due to cost.  She paid ~$200 recently for co-pay and is resuming today.   Has not checked blood pressure at home for at least ~ 1 month.     Objective: The 10-year ASCVD risk score Mikey Bussing DC Brooke Bonito., et al., 2013) is: 23.9%   Values used to calculate the score:     Age: 68 years     Sex: Female     Is Non-Hispanic African American: Yes     Diabetic: Yes     Tobacco smoker: No     Systolic Blood Pressure: 578 mmHg     Is BP treated: Yes     HDL Cholesterol: 45.3 mg/dL     Total Cholesterol: 194 mg/dL  Lab Results  Component Value Date   CREATININE 0.89 03/25/2018   CREATININE 0.78 01/11/2018   CREATININE 0.83 11/09/2017    Lab Results  Component Value Date   HGBA1C 10.1 (H) 01/11/2018    Lipid Panel     Component Value Date/Time   CHOL 194 01/11/2018 0958   TRIG (H) 01/11/2018 0958    460.0 Triglyceride is over 400; calculations on Lipids are invalid.   HDL 45.30 01/11/2018 0958   CHOLHDL 4 01/11/2018 0958   VLDL 52.0 (H) 07/12/2017 0850   LDLCALC 79 09/11/2012 0615   LDLDIRECT 81.0 01/11/2018 0958    BP Readings from Last 3 Encounters:  04/13/18 134/86  03/27/18 (!) 142/82  03/25/18 (!) 118/58     Allergies  Allergen Reactions  . Dairy Aid [Lactase] Diarrhea and Other (See Comments)    Bloating and gastric distress  . Metformin And Related Other (See Comments)    GI upset  . Ace Inhibitors Other (See Comments)    REACTION: angioedema right eye  . Aspirin Other (See Comments)    Bleeding   . Codeine Other (See Comments)    Hallucinations, can take if with someone.  . Soy Allergy Other (See Comments)    Bleeding & cramps, diarrhea     Medications Reviewed Today    Reviewed by Luretha Rued, RN (Registered Nurse) on 07/11/18 at 1255  Med List Status: <None>  Medication Order Taking? Sig Documenting Provider Last Dose Status Informant  acetaminophen (TYLENOL) 500 MG tablet [id number] Yes Take 500 mg by mouth daily as needed. [provider] Taking Active Self  albuterol (PROVENTIL HFA;VENTOLIN HFA) 108 (90 BASE) MCG/ACT inhaler [id number] Yes Inhale 2 puffs into the lungs every 6 (six) hours as needed for wheezing or shortness of breath. Biagio Borg, MD Taking Active Self  amLODipine (NORVASC) 10 MG tablet [id number] Yes TAKE 1 TABLET BY MOUTH EVERY DAY Biagio Borg, MD Taking Active Self  atorvastatin (LIPITOR) 40 MG tablet [id number] Yes TAKE 1 TABLET(40 MG) BY MOUTH DAILY Biagio Borg, MD Taking Active  Blood Glucose Monitoring Suppl (ONE TOUCH ULTRA 2) W/DEVICE KIT [id number]  Use to check blood sugars daily Dx E11.9 Biagio Borg, MD  Active Self  cetirizine (ZYRTEC) 10 MG tablet [id number] Yes Take 10 mg by mouth daily.   [provider] Taking Active Self  clonazePAM (KLONOPIN) 0.5 MG tablet [id number] Yes TAKE 1 TABLET BY MOUTH TWICE DAILY AS NEEDED FOR ANXIETY Biagio Borg, MD Taking Active   Dulaglutide (TRULICITY) 9.67 YO/3.7CH SOPN [id number] No Inject 0.5 mLs into the skin once a week.  Patient not taking: Reported on 07/11/2018   Biagio Borg, MD Not Taking Active   fenofibrate (TRICOR) 145 MG tablet [id number] No Take 1 tablet (145 mg total) by  mouth daily.  Patient not taking: Reported on 07/11/2018   Biagio Borg, MD Not Taking Active   glipiZIDE (GLUCOTROL XL) 10 MG 24 hr tablet [id number] Yes TAKE 1 TABLET BY MOUTH EVERY DAY WITH BREAKFAST Biagio Borg, MD Taking Active   glucose blood test strip [id number]  1 each by Other route daily. Use to check blood sugar daily Dx e11.9 Biagio Borg, MD  Active Self  hydrochlorothiazide (MICROZIDE) 12.5 MG capsule [id number] Yes Take 1 capsule (12.5 mg total) by mouth daily. Biagio Borg, MD Taking Active   hydrocortisone (ANUSOL-HC) 2.5 % rectal cream [id number] Yes Place rectally 2 (two) times daily. Reyne Dumas, MD Taking Active   irbesartan (AVAPRO) 300 MG tablet [id number] Yes TAKE 1 TABLET BY MOUTH EVERY NIGHT AT BEDTIME Biagio Borg, MD Taking Active   latanoprost (XALATAN) 0.005 % ophthalmic solution [id number] Yes Place 1 drop into both eyes at bedtime. [provider] Taking Active   losartan (COZAAR) 100 MG tablet [id number] Yes Take 1 tablet (100 mg total) by mouth daily. Biagio Borg, MD Taking Active   meclizine (ANTIVERT) 12.5 MG tablet [id number] Yes Take 1 tablet (12.5 mg total) by mouth 3 (three) times daily as needed for dizziness. Biagio Borg, MD Taking Active   metoprolol succinate (TOPROL-XL) 100 MG 24 hr tablet [id number] Yes TAKE 1 TABLET BY MOUTH EVERY DAY Biagio Borg, MD Taking Active   ondansetron (ZOFRAN ODT) 4 MG disintegrating tablet [id number] Yes Take 1 tablet (4 mg total) by mouth every 8 (eight) hours as needed for nausea. Larene Pickett, PA-C Taking Active   [facility] LANCETS 25W MISC [id number]  USE TO HELP CHECK BLOOD SUGAR DAILY Biagio Borg, MD  Active Self  oxcarbazepine (TRILEPTAL) 600 MG tablet [id number] Yes TAKE 1 TABLET BY MOUTH TWICE DAILY Biagio Borg, MD Taking Active   pantoprazole (PROTONIX) 40 MG tablet [id number] Yes Take 1 tablet (40 mg total) by mouth daily. Biagio Borg, MD Taking Active   potassium chloride (K-DUR,KLOR-CON)  10 MEQ tablet [id number] Yes Take 2 tablets (20 mEq total) by mouth daily. Biagio Borg, MD Taking Active   prochlorperazine (COMPAZINE) 10 MG tablet [id number] Yes Take 1 tablet (10 mg total) by mouth every 6 (six) hours as needed for nausea or vomiting. Biagio Borg, MD Taking Active   topiramate (TOPAMAX) 100 MG tablet [id number] Yes Take 1 tablet (100 mg total) by mouth daily. Biagio Borg, MD Taking Active           Assessment: Drugs sorted by system:  Neurologic/Psychologic: clonazepam, meclizine, oxcarbazepine, topiramate  Cardiovascular: atorvastatin, HCTZ, irbesartan + losartan, metoprolol, amlodipine, fenofibrate  Pulmonary/Allergy: albuterol inhaler, cetirizine  Gastrointestinal: ondansetron, prochlorperazine, pantoprazole  Endocrine: dulaglutide inj, glipizide  Topical: hydrocortisone cream  Pain: acetaminophen  Vitamins/Minerals/Supplements: potassium   Medication Review Findings:   Amlodipine: reports no longer taking, states discontinued - will clarify with PCP  Fenofibrate: reports no longer taking, states discontinued - will clarify with PCP  Irbesartan + losartan: duplicate therapy, patient reports taking both, may have been confusion with [facility] recalls in the last year, patient currently out of irbesartan and new RX called in yesterday to pharmacy, counseled patient to not pick this up until we confirm therapy with PCP  Albuterol inhaler: does not have medication at home, will request new RX from PCP  Non-compliance with Trulicity due to cost.  Resumed Trulicity last week after paying expensive copay, may not be able to afford another fill.  Patient assistance program for Trulicity already started for patient.  Counseled patient to check mail for application and to return to [facility] as soon as possible with proof of income.  Patient voiced understanding.    Discussed safe medication drug disposal through [place] locations for deceased spouse's  medications  Medication Assistance Findings:  Medication assistance needs identified: Trulicity  Extra Help:  Not eligible for Extra Help Low Income Subsidy based on reported income and assets  Patient Assistance Programs: Trulicity made by [employer] o Income requirement met: Yes o Out-of-pocket prescription expenditure met:   Not Applicable - Patient has met application requirements to apply for this patient assistance program.   - Reviewed program requirements with patient.   - Application mailed out to patient's home on 6/22, will await return of documents from patient and provider  Plan: . F/u with Dr. [name] regarding medication issues noted above  Ralene Bathe, PharmD, [place] [phone]

## 2018-07-19 ENCOUNTER — Other Ambulatory Visit: Payer: Self-pay | Admitting: Pharmacist

## 2018-07-19 ENCOUNTER — Other Ambulatory Visit: Payer: Self-pay | Admitting: Internal Medicine

## 2018-07-19 ENCOUNTER — Telehealth: Payer: Self-pay

## 2018-07-19 ENCOUNTER — Ambulatory Visit: Payer: Self-pay | Admitting: Pharmacist

## 2018-07-19 MED ORDER — ALBUTEROL SULFATE HFA 108 (90 BASE) MCG/ACT IN AERS: 2.0000 | INHALATION_SPRAY | Freq: Four times a day (QID) | RESPIRATORY_TRACT | 5 refills | Status: DC | PRN

## 2018-07-19 NOTE — Patient Outreach (Signed)
[facility]) Care Management  [place] 07/19/2018  [name] [birth year] [id number]  Reason for call: F/u on medication issues noted on 07/17/2018.  Note routed to Dr. Cathlean Cower with the following message on 07/17/2018 as recorded in [facility]:   -------------------------------------------------------------- Hi Dr. [name],   I spoke with your patient Stacey [name] today to review medications. I wanted to clarify a few of medication issues with you that came up.   1. Amlodipine + fenofibrate: reports no longer taking, states discontinued. Can y ou confirm if these are still active or d/c'd? Did not see any mention of stopping these in your recent notes.   2. Irbesartan + losartan: Taking both medications currently, ran out of irbesartan recently and new RX called in yesterday. Counseled pat ient to not pick this up until we confirm which medication she should be taking.   3. Albuterol inhaler: does not have medication at home, can you please send in new RX?   4. Trulicity - in coverage gap, difficulty affording, have started patient ass istance program application process with patient, faxed your office yesterday with paperwork.   ------------------------------------------------------------------   No response yet from Dr. Cathlean Cower.  Call placed to office this morning to again try to f/u on medication issues.  Will route questions from above again to provider and wait clarification.  This is the 3rd attempt to contact PCP.   Ralene Bathe, PharmD, [place] [phone]

## 2018-07-19 NOTE — Telephone Encounter (Signed)
Copied from [place] [id number]. Topic: General - Inquiry >> Jul 19, 2018  8:38 AM Richardo Priest, NT wrote: Reason for CRM: Jaclyn Shaggy calling in regards to note she routed to PCP 6/23. Please advise and give a call back [phone].

## 2018-07-19 NOTE — Telephone Encounter (Signed)
Unfortuantely I cannot take the time to read a 3 page virtual note   Please state concisely any concern, request or question.

## 2018-07-23 ENCOUNTER — Other Ambulatory Visit: Payer: Self-pay | Admitting: Pharmacist

## 2018-07-23 ENCOUNTER — Ambulatory Visit: Payer: Self-pay | Admitting: Pharmacist

## 2018-07-23 ENCOUNTER — Other Ambulatory Visit: Payer: Self-pay | Admitting: Internal Medicine

## 2018-07-23 MED ORDER — AMLODIPINE BESYLATE 10 MG PO TABS: 10.0000 mg | ORAL_TABLET | Freq: Every day | ORAL | 3 refills | Status: DC

## 2018-07-23 MED ORDER — FENOFIBRATE 145 MG PO TABS: 145.0000 mg | ORAL_TABLET | Freq: Every day | ORAL | 3 refills | Status: DC

## 2018-07-23 NOTE — Patient Outreach (Signed)
[facility]) Care Management  [place] 07/23/2018  Stacey [name] 05-13-1950 [id number]  Communication received from Dr. Cathlean Cower:  -Patient should be taking amlodipine, fenofibrate. -Patient should NOT be taking irbesartan.  -Albuterol RX called into pharmacy  Successful call to patient.  Updated patient on MD recommendations.  Patient reports she stopped amlodipine and fenofibrate due to concern about side effects.  We reviewed possible adverse side effects of both.  Patient aware to monitor herself carefully and report any side effects to provider.    3-way call to [place].   -Irbesartan prescription will be de-activated -Amlodipine + Fenofibrate -no active prescriptions -Albuterol RX ready for $33.70, patient reports she can afford this  Message sent to PCP requesting new RXs for amlodipine and fenofibrate.  Patient will pick up as soon as they are ready.    Patient reports she received application for Trulicity.  She will return documents this week.  No further questions at this time.   Plan: Request new RXs from PCP for amlodipine, fenofibrate.   [facility] pharmacy technician will f/u with patient as needed regarding Trulicity application.   Ralene Bathe, PharmD, [place] [phone]

## 2018-07-25 ENCOUNTER — Other Ambulatory Visit: Payer: Self-pay | Admitting: Pharmacy Technician

## 2018-07-25 NOTE — Patient Outreach (Signed)
[facility]) Care Management  07/25/2018  Stacey [name] October 09, 1950 [id number]    Received email from patient with financial document.   Successful call placed to patient regarding patient assistance application(s) for Trulicity , HIPAA identifiers verified. Informed Mrs. [name] that I received the email she sent. Informed her that financial document would need to include her name and Financial institution on the document. She stated that she would try to obtain a document that has those items on it. She confirms that put application in the mail on 6/30.  Follow up:  Will submit to compnay once all document have been received.  Maud Deed Chana Bode [place] Certified Pharmacy Technician [place] Management Direct [phone]

## 2018-08-01 ENCOUNTER — Other Ambulatory Visit: Payer: Self-pay | Admitting: Pharmacy Technician

## 2018-08-01 NOTE — Patient Outreach (Signed)
[facility]) Care Management  08/01/2018  Stacey [name] [birth year] [id number]    Successful call placed to patient, HIPAA identifiers verified. Informed patient that I had received the to emails that she had sent in with her proof of income details. Also informed her that I am still waiting on her application to arrive in the mail.  Will submit to [facility] once all documents have been received.  Maud Deed Chana Bode [place] Certified Pharmacy Technician [place] Management Direct Dial:[phone]

## 2018-08-07 ENCOUNTER — Other Ambulatory Visit: Payer: Self-pay | Admitting: Internal Medicine

## 2018-08-10 ENCOUNTER — Other Ambulatory Visit: Payer: Self-pay | Admitting: Internal Medicine

## 2018-08-10 ENCOUNTER — Encounter: Payer: Self-pay | Admitting: Internal Medicine

## 2018-08-10 ENCOUNTER — Ambulatory Visit (INDEPENDENT_AMBULATORY_CARE_PROVIDER_SITE_OTHER): Payer: PPO | Admitting: Internal Medicine

## 2018-08-10 ENCOUNTER — Other Ambulatory Visit: Payer: Self-pay

## 2018-08-10 ENCOUNTER — Telehealth: Payer: Self-pay | Admitting: Internal Medicine

## 2018-08-10 ENCOUNTER — Other Ambulatory Visit (INDEPENDENT_AMBULATORY_CARE_PROVIDER_SITE_OTHER): Payer: PPO

## 2018-08-10 VITALS — BP 130/86 | HR 76 | Temp 98.9°F | Ht 66.5 in | Wt 173.0 lb

## 2018-08-10 DIAGNOSIS — E119 Type 2 diabetes mellitus without complications: Secondary | ICD-10-CM

## 2018-08-10 DIAGNOSIS — E611 Iron deficiency: Secondary | ICD-10-CM | POA: Diagnosis not present

## 2018-08-10 DIAGNOSIS — E559 Vitamin D deficiency, unspecified: Secondary | ICD-10-CM

## 2018-08-10 DIAGNOSIS — E538 Deficiency of other specified B group vitamins: Secondary | ICD-10-CM

## 2018-08-10 DIAGNOSIS — Z0000 Encounter for general adult medical examination without abnormal findings: Secondary | ICD-10-CM | POA: Diagnosis not present

## 2018-08-10 LAB — BASIC METABOLIC PANEL
BUN: 23 mg/dL (ref 6–23)
CO2: 28 mEq/L (ref 19–32)
Calcium: 9.6 mg/dL (ref 8.4–10.5)
Chloride: 95 mEq/L — ABNORMAL LOW (ref 96–112)
Creatinine, Ser: 1.17 mg/dL (ref 0.40–1.20)
GFR: 55.82 mL/min — ABNORMAL LOW (ref >60.00)
Glucose, Bld: 505 mg/dL (ref 70–99)
Potassium: 4.1 mEq/L (ref 3.5–5.1)
Sodium: 133 mEq/L — ABNORMAL LOW (ref 135–145)

## 2018-08-10 LAB — URINALYSIS, ROUTINE W REFLEX MICROSCOPIC
Bilirubin Urine: NEGATIVE
Hgb urine dipstick: NEGATIVE
Ketones, ur: NEGATIVE
Leukocytes,Ua: NEGATIVE
Nitrite: NEGATIVE
Specific Gravity, Urine: 1.015 (ref 1.000–1.030)
Total Protein, Urine: NEGATIVE
Urine Glucose: >=1000 — AB
Urobilinogen, UA: 0.2 (ref 0.0–1.0)
pH: 5 (ref 5.0–8.0)

## 2018-08-10 LAB — VITAMIN D 25 HYDROXY (VIT D DEFICIENCY, FRACTURES): VITD: 10.08 ng/mL — ABNORMAL LOW (ref 30.00–100.00)

## 2018-08-10 LAB — CBC WITH DIFFERENTIAL/PLATELET
Basophils Absolute: 0 10*3/uL (ref 0.0–0.1)
Basophils Relative: 0.9 % (ref 0.0–3.0)
Eosinophils Absolute: 0.1 10*3/uL (ref 0.0–0.7)
Eosinophils Relative: 1.1 % (ref 0.0–5.0)
HCT: 39.3 % (ref 36.0–46.0)
Hemoglobin: 12.6 g/dL (ref 12.0–15.0)
Lymphocytes Relative: 27.9 % (ref 12.0–46.0)
Lymphs Abs: 1.4 10*3/uL (ref 0.7–4.0)
MCHC: 32 g/dL (ref 30.0–36.0)
MCV: 77.9 fl — ABNORMAL LOW (ref 78.0–100.0)
Monocytes Absolute: 0.3 10*3/uL (ref 0.1–1.0)
Monocytes Relative: 5.1 % (ref 3.0–12.0)
Neutro Abs: 3.3 10*3/uL (ref 1.4–7.7)
Neutrophils Relative %: 65 % (ref 43.0–77.0)
Platelets: 204 10*3/uL (ref 150.0–400.0)
RBC: 5.04 Mil/uL (ref 3.87–5.11)
RDW: 14.9 % (ref 11.5–15.5)
WBC: 5 10*3/uL (ref 4.0–10.5)

## 2018-08-10 LAB — LIPID PANEL
Cholesterol: 223 mg/dL — ABNORMAL HIGH (ref 0–200)
HDL: 40.7 mg/dL (ref >39.00)
Total CHOL/HDL Ratio: 5
Triglycerides: 642 mg/dL — ABNORMAL HIGH (ref 0.0–149.0)

## 2018-08-10 LAB — HEPATIC FUNCTION PANEL
ALT: 29 U/L (ref 0–35)
AST: 39 U/L — ABNORMAL HIGH (ref 0–37)
Albumin: 4.5 g/dL (ref 3.5–5.2)
Alkaline Phosphatase: 128 U/L — ABNORMAL HIGH (ref 39–117)
Bilirubin, Direct: 0.1 mg/dL (ref 0.0–0.3)
Total Bilirubin: 0.4 mg/dL (ref 0.2–1.2)
Total Protein: 7.5 g/dL (ref 6.0–8.3)

## 2018-08-10 LAB — IBC PANEL
Iron: 65 ug/dL (ref 42–145)
Saturation Ratios: 20.4 % (ref 20.0–50.0)
Transferrin: 228 mg/dL (ref 212.0–360.0)

## 2018-08-10 LAB — LDL CHOLESTEROL, DIRECT: Direct LDL: 98 mg/dL

## 2018-08-10 LAB — VITAMIN B12: Vitamin B-12: 391 pg/mL (ref 211–911)

## 2018-08-10 LAB — MICROALBUMIN / CREATININE URINE RATIO
Creatinine,U: 35.7 mg/dL
Microalb Creat Ratio: 15.3 mg/g (ref 0.0–30.0)
Microalb, Ur: 5.5 mg/dL — ABNORMAL HIGH (ref 0.0–1.9)

## 2018-08-10 LAB — TSH: TSH: 0.57 u[IU]/mL (ref 0.35–4.50)

## 2018-08-10 MED ORDER — POTASSIUM CHLORIDE CRYS ER 10 MEQ PO TBCR: 20.0000 meq | EXTENDED_RELEASE_TABLET | Freq: Every day | ORAL | 3 refills | Status: DC

## 2018-08-10 MED ORDER — PIOGLITAZONE HCL 45 MG PO TABS: 45.0000 mg | ORAL_TABLET | Freq: Every day | ORAL | 3 refills | Status: DC

## 2018-08-10 NOTE — Progress Notes (Signed)
Subjective:    Patient ID: Stacey [name], female    DOB: Jun 21, 1950, 68 y.o.   MRN: [id number]  HPI Here for wellness and f/u;  Overall doing ok;  Pt denies Chest pain, worsening SOB, DOE, wheezing, orthopnea, PND, worsening LE edema, palpitations, dizziness or syncope.  Pt denies neurological change such as new headache, facial or extremity weakness.  Pt denies polydipsia, polyuria, or low sugar symptoms. Pt states overall good compliance with treatment and medications, good tolerability, and has been trying to follow appropriate diet.  Pt denies worsening depressive symptoms, suicidal ideation or panic. No fever, night sweats, wt loss, loss of appetite, or other constitutional symptoms.  Pt states good ability with ADL's, has low fall risk, home safety reviewed and adequate, no other significant changes in hearing or vision, and only occasionally active with exercise.  Has been doing much better with diet since a1c so much worse last visit dec 2019; lost 4lbs Wt Readings from Last 3 Encounters:  08/10/18 173 lb (78.5 kg)  04/13/18 177 lb 4 oz (80.4 kg)  03/27/18 176 lb (79.8 kg)  Husband died 2024-05-23with mycosis fungoides cancer and sepsis. Grieving but o/w ok.    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 Past Medical History:  Diagnosis Date  . ANGIOEDEMA 03/07/2008   a. with ACE-I  . ANXIETY 09/16/2006  . ASTHMA 08/01/2006  . CAD (coronary artery disease)    a. 01/2010 : Minimal plaque at cardiac catheterization - CFX 20%, EF 70%;  b. 08/2012 Cath: Nl Cors, EF 65%.  . Chronic diastolic CHF (congestive heart failure) ([place]) 04/06/2010   a. In setting of HOCM.  Marland Kitchen Complete heart block ([place]) 02/26/2014   a. s/p MDT dual chamber pacemaker 02/2014  . COPD (chronic obstructive pulmonary disease) ([place])   . DEPRESSION 09/16/2006  . DIVERTICULOSIS, COLON, WITH  HEMORRHAGE 04/06/2010  . DM (diabetes mellitus) in pregnancy, delivered w/postpartum condition 08/30/2010  . HYPERLIPIDEMIA 08/01/2006  . HYPERTENSION 08/01/2006  . 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 s/p septal myomectomy 4/12 at [place] with Dr. Evelina Dun;  b. echo 5/12: EF 60-65%, LVOT peak 18 mmHg; grade 1 diast dysfxn, mild SAM (improved since myomectomy;  c. 03/2012 Echo: EF 60-65%, mod-sev basal septal asymm hypertrophy, basal septal HK, LVOT grad 53mHg, Gr 1 DD, , SAM, Mild MR, nl RV, PASP 345mg; 08/2012 Echo: technically difficult, doubt LVOT obstruction, EF 60%, Gr 1 DD, mod-sev dil LA.  . Impaired glucose tolerance 06/25/2010  . LBBB (left bundle branch block) 06/17/2010  . OBESITY 09/16/2006  . Renal artery aneurysm ([place]  . SICKLE CELL ANEMIA 08/01/2006  . Type II or unspecified type diabetes mellitus without mention of complication, uncontrolled 08/30/2010  . VENTRAL HERNIA 12/07/2006   Past Surgical History:  Procedure Laterality Date  . ABDOMINAL HYSTERECTOMY  1987  . CARDIAC SURGERY    . COLONOSCOPY N/A 12/26/2016   Procedure: COLONOSCOPY;  Surgeon: JaMilus BanisterMD;  Location: [facility];  Service: Endoscopy;  Laterality: N/A;  . ELECTROPHYSIOLOGY STUDY N/A 09/12/2012   Procedure: ELECTROPHYSIOLOGY STUDY;  Surgeon: StDeboraha SprangMD;  [facility];  Service: Cardiovascular;  Laterality: N/A;  . LEFT HEART CATH  Aug. 18, 2014   Medtronic heart device  . LEFT HEART CATHETERIZATION WITH CORONARY ANGIOGRAM N/A 09/10/2012   Procedure: LEFT HEART CATHETERIZATION WITH CORONARY ANGIOGRAM;  Surgeon: Burnell Blanks, MD;  Location: [facility] CATH LAB;  Service: Cardiovascular;  Laterality: N/A;  . MYOMECTOMY     Septal  . PERMANENT PACEMAKER INSERTION N/A 02/26/2014   MDT Advisa dual chamber pacemaker implanted by Dr Caryl Comes for heart block  . VENTRAL HERNIA REPAIR  06/2006    reports that she quit smoking about 20 years ago. Her smoking use included cigarettes. She has a 5.00 pack-year  smoking history. She has never used smokeless tobacco. She reports that she does not drink alcohol or use drugs. family history includes Cancer in her daughter; Clotting disorder in her daughter and another family member; Colon cancer in her daughter; Diabetes in her paternal grandmother; Heart attack in her father and sister; Heart disease in her daughter and father; Hyperlipidemia in her brother, daughter, and sister; Hypertension in her brother, father, sister, and son; Stroke in her sister. Allergies  Allergen Reactions  . Dairy Aid [Lactase] Diarrhea and Other (See Comments)    Bloating and gastric distress  . Metformin And Related Other (See Comments)    GI upset  . Ace Inhibitors Other (See Comments)    REACTION: angioedema right eye  . Aspirin Other (See Comments)    Bleeding   . Codeine Other (See Comments)    Hallucinations, can take if with someone.  . Soy Allergy Other (See Comments)    Bleeding & cramps, diarrhea    Current Outpatient Medications on File Prior to Visit  Medication Sig Dispense Refill  . acetaminophen (TYLENOL) 500 MG tablet Take 500 mg by mouth daily as needed.    Marland Kitchen albuterol (VENTOLIN HFA) 108 (90 Base) MCG/ACT inhaler Inhale 2 puffs into the lungs every 6 (six) hours as needed for wheezing or shortness of breath. 18 g 5  . amLODipine (NORVASC) 10 MG tablet Take 1 tablet (10 mg total) by mouth daily. 90 tablet 3  . atorvastatin (LIPITOR) 40 MG tablet TAKE 1 TABLET(40 MG) BY MOUTH DAILY 90 tablet 3  . Blood Glucose Monitoring Suppl (ONE TOUCH ULTRA 2) W/DEVICE KIT Use to check blood sugars daily Dx E11.9 1 each 0  . cetirizine (ZYRTEC) 10 MG tablet Take 10 mg by mouth daily.      . clonazePAM (KLONOPIN) 0.5 MG tablet TAKE 1 TABLET BY MOUTH TWICE DAILY AS NEEDED FOR ANXIETY 30 tablet 2  . Dulaglutide (TRULICITY) 1.88 CZ/6.6AY SOPN Inject 0.5 mLs into the skin once a week. 6 mL 3  . fenofibrate (TRICOR) 145 MG tablet Take 1 tablet (145 mg total) by mouth  daily. 90 tablet 3  . glipiZIDE (GLUCOTROL XL) 10 MG 24 hr tablet TAKE 1 TABLET BY MOUTH EVERY DAY WITH BREAKFAST 90 tablet 0  . glucose blood test strip 1 each by Other route daily. Use to check blood sugar daily Dx e11.9 100 each 11  . hydrochlorothiazide (MICROZIDE) 12.5 MG capsule Take 1 capsule (12.5 mg total) by mouth daily. 90 capsule 3  . hydrocortisone (ANUSOL-HC) 2.5 % rectal cream Place rectally 2 (two) times daily. (Patient taking differently: Place rectally as needed. ) 30 g 1  . latanoprost (XALATAN) 0.005 % ophthalmic solution Place 1 drop into both eyes at bedtime.  3  . losartan (COZAAR) 100 MG tablet Take 1 tablet (100 mg total) by mouth daily. 90 tablet 3  . meclizine (ANTIVERT) 12.5 MG tablet Take 1 tablet (12.5 mg total) by mouth 3 (three) times daily as needed for dizziness. 40 tablet 2  . metoprolol succinate (TOPROL-XL) 100 MG  24 hr tablet TAKE 1 TABLET BY MOUTH EVERY DAY 90 tablet 0  . ondansetron (ZOFRAN ODT) 4 MG disintegrating tablet Take 1 tablet (4 mg total) by mouth every 8 (eight) hours as needed for nausea. 10 tablet 0  . ONETOUCH DELICA LANCETS 42A MISC USE TO HELP CHECK BLOOD SUGAR DAILY 200 each 0  . oxcarbazepine (TRILEPTAL) 600 MG tablet TAKE 1 TABLET BY MOUTH TWICE DAILY 180 tablet 1  . pantoprazole (PROTONIX) 40 MG tablet Take 1 tablet (40 mg total) by mouth daily. 90 tablet 3  . prochlorperazine (COMPAZINE) 10 MG tablet Take 1 tablet (10 mg total) by mouth every 6 (six) hours as needed for nausea or vomiting. 30 tablet 1  . topiramate (TOPAMAX) 100 MG tablet Take 1 tablet (100 mg total) by mouth daily. 90 tablet 0   No current facility-administered medications on file prior to visit.    Review of Systems Constitutional: Negative for other unusual diaphoresis, sweats, appetite or weight changes HENT: Negative for other worsening hearing loss, ear pain, facial swelling, mouth sores or neck stiffness.   Eyes: Negative for other worsening pain, redness or  other visual disturbance.  Respiratory: Negative for other stridor or swelling Cardiovascular: Negative for other palpitations or other chest pain  Gastrointestinal: Negative for worsening diarrhea or loose stools, blood in stool, distention or other pain Genitourinary: Negative for hematuria, flank pain or other change in urine volume.  Musculoskeletal: Negative for myalgias or other joint swelling.  Skin: Negative for other color change, or other wound or worsening drainage.  Neurological: Negative for other syncope or numbness. Hematological: Negative for other adenopathy or swelling Psychiatric/Behavioral: Negative for hallucinations, other worsening agitation, SI, self-injury, or new decreased concentration All other system neg per pt    Objective:   Physical Exam BP 130/86 (BP Location: Left Arm, Patient Position: Sitting, Cuff Size: Normal)   Pulse 76   Temp 98.9 F (37.2 C) (Oral)   Ht 5' 6.5" (1.689 m)   Wt 173 lb (78.5 kg)   SpO2 94%   BMI 27.50 kg/m \ VS noted,  Constitutional: Pt is oriented to person, place, and time. Appears well-developed and well-nourished, in no significant distress and comfortable Head: Normocephalic and atraumatic  Eyes: Conjunctivae and EOM are normal. Pupils are equal, round, and reactive to light Right Ear: External ear normal without discharge Left Ear: External ear normal without discharge Nose: Nose without discharge or deformity Mouth/Throat: Oropharynx is without other ulcerations and moist  Neck: Normal range of motion. Neck supple. No JVD present. No tracheal deviation present or significant neck LA or mass Cardiovascular: Normal rate, regular rhythm, normal heart sounds and intact distal pulses.   Pulmonary/Chest: WOB normal and breath sounds without rales or wheezing  Abdominal: Soft. Bowel sounds are normal. NT. No HSM  Musculoskeletal: Normal range of motion. Exhibits no edema Lymphadenopathy: Has no other cervical adenopathy.   Neurological: Pt is alert and oriented to person, place, and time. Pt has normal reflexes. No cranial nerve deficit. Motor grossly intact, Gait intact Skin: Skin is warm and dry. No rash noted or new ulcerations Psychiatric:  Has normal mood and affect. Behavior is normal without agitation No other exam findings Lab Results  Component Value Date   WBC 5.0 08/10/2018   HGB 12.6 08/10/2018   HCT 39.3 08/10/2018   PLT 204.0 08/10/2018   GLUCOSE 505 (HH) 08/10/2018   CHOL 223 (H) 08/10/2018   TRIG (H) 08/10/2018    642.0 Triglyceride is over 400;  calculations on Lipids are invalid.   HDL 40.70 08/10/2018   LDLDIRECT 98.0 08/10/2018   LDLCALC 79 09/11/2012   ALT 29 08/10/2018   AST 39 (H) 08/10/2018   NA 133 (L) 08/10/2018   K 4.1 08/10/2018   CL 95 (L) 08/10/2018   CREATININE 1.17 08/10/2018   BUN 23 08/10/2018   CO2 28 08/10/2018   TSH 0.57 08/10/2018   INR 1.35 12/23/2016   HGBA1C 12.2 (H) 08/10/2018   MICROALBUR 5.5 (H) 08/10/2018       Assessment & Plan:

## 2018-08-10 NOTE — Progress Notes (Signed)
Lab Results  Component Value Date   WBC 5.0 08/10/2018   HGB 12.6 08/10/2018   HCT 39.3 08/10/2018   PLT 204.0 08/10/2018   GLUCOSE 505 (HH) 08/10/2018   CHOL 223 (H) 08/10/2018   TRIG (H) 08/10/2018    642.0 Triglyceride is over 400; calculations on Lipids are invalid.   HDL 40.70 08/10/2018   LDLDIRECT 98.0 08/10/2018   LDLCALC 79 09/11/2012   ALT 29 08/10/2018   AST 39 (H) 08/10/2018   NA 133 (L) 08/10/2018   K 4.1 08/10/2018   CL 95 (L) 08/10/2018   CREATININE 1.17 08/10/2018   BUN 23 08/10/2018   CO2 28 08/10/2018   TSH 0.57 08/10/2018   INR 1.35 12/23/2016   HGBA1C 10.1 (H) 01/11/2018   MICROALBUR 5.5 (H) 08/10/2018

## 2018-08-10 NOTE — Telephone Encounter (Signed)
Lab called to report a critical level: Glucose - 505

## 2018-08-10 NOTE — Patient Instructions (Signed)

## 2018-08-11 ENCOUNTER — Other Ambulatory Visit: Payer: Self-pay | Admitting: Internal Medicine

## 2018-08-11 ENCOUNTER — Encounter: Payer: Self-pay | Admitting: Internal Medicine

## 2018-08-11 DIAGNOSIS — E119 Type 2 diabetes mellitus without complications: Secondary | ICD-10-CM

## 2018-08-11 LAB — HEMOGLOBIN A1C
Hgb A1c MFr Bld: 12.2 % of total Hgb — ABNORMAL HIGH (ref <5.7)
Mean Plasma Glucose: 303 (calc)
eAG (mmol/L): 16.8 (calc)

## 2018-08-11 NOTE — Assessment & Plan Note (Addendum)
stable overall by history and exam, recent data reviewed with pt, and pt to continue medical treatment as before,  to f/u any worsening symptoms or concerns, for a1c f/u

## 2018-08-11 NOTE — Assessment & Plan Note (Signed)

## 2018-08-13 ENCOUNTER — Other Ambulatory Visit: Payer: PPO

## 2018-08-13 ENCOUNTER — Telehealth: Payer: Self-pay

## 2018-08-13 DIAGNOSIS — E119 Type 2 diabetes mellitus without complications: Secondary | ICD-10-CM | POA: Diagnosis not present

## 2018-08-13 NOTE — Telephone Encounter (Signed)
Pt has been informed of results and expressed understanding.  °

## 2018-08-13 NOTE — Telephone Encounter (Signed)
-----   Message from Biagio Borg, MD sent at 08/11/2018  6:09 AM EDT ----- Left message on MyChart, pt to cont same tx except we should refer to DM education and endocrinology, as the sugar is much worse over the past yr  Delma Villalva to please inform pt, I will do referrals

## 2018-08-14 LAB — HEMOGLOBIN A1C
Hgb A1c MFr Bld: 12.1 % of total Hgb — ABNORMAL HIGH (ref <5.7)
Mean Plasma Glucose: 301 (calc)
eAG (mmol/L): 16.6 (calc)

## 2018-08-16 ENCOUNTER — Other Ambulatory Visit: Payer: Self-pay | Admitting: Pharmacy Technician

## 2018-08-16 NOTE — Patient Outreach (Signed)
[facility]) Care Management  08/16/2018  Stacey [name] 05-17-1950 [id number]    Successful call placed to patient regarding patient assistance application(s) for Trulicity , HIPAA identifiers verified. Informed [name] that I still have not received application in the mail. Informed her that I would mail out another application to her due to there being issues with the mail. She states that would be okay.  Follow up:  Will follow up with patient in 7-10 business days to confirm application has been received.  Maud Deed Chana Bode [place] Certified Pharmacy Technician [place] Management Direct Dial:[phone]

## 2018-08-22 ENCOUNTER — Other Ambulatory Visit: Payer: Self-pay

## 2018-08-22 NOTE — Patient Outreach (Signed)
  [place]) Care Management Chronic Special Needs Program  08/22/2018  Name: Stacey [name] DOB: [birth year]  MRN: [id number]  Ms. Camerin [name] is enrolled in a chronic special needs plan for Diabetes. Reviewed and updated care plan.  Subjective: client reports she is doing better. She reports she has followed up with primary care. She states she has changed her diet, she is more active. "I am doing really good". She denies any social work needs and states she is in better spirits. She reports a very supportive family who she speaks with every day. She also acknowledges that she continues to weigh self daily and knows when to call the doctor regarding fluid volume. She reports she is waiting on a home test from provider office for follow up regarding blood sugar. She states today her blood sugar was 178 which she reports is improved. She states her stress level has decreased which has helped improved her blood sugar and overall sense of well being.  Goals Addressed            This Visit's Progress   . Client understands the importance of follow-up with providers by attending scheduled visits   On track    Diabetes self management actions: Glucose monitoring per provider recommendations Eat Healthy Check feet daily Visit provider every 3-6 months as directed Hbg A1C level every 3-6 months. Eye Exam yearly    . Client verbalize knowledge of Heart Failure disease self management skills within 6-9 months.   On track    Take Medications as prescribed. Monitor Salt and fluid volume as recommended. Attend Follow up appointments as scheduled.     . Client will report abillity to obtain Medications within 6 months.   On track   . Client will report no worsening of symptoms of Atrial Fibrillation within the next 3 months   On track   . Client will report no worsening of symptoms related to heart disease within the next 6-9 months.   On track   .  Maintain timely refills of diabetic medication as prescribed within the year .   On track   . COMPLETED: Obtain annual  Lipid Profile, LDL-C       Done on 08/10/2018    . COMPLETED: Obtain annual screen for micro albuminuria (urine) , nephropathy (kidney problems)       Done on 08/10/2018    . Obtain Hemoglobin A1C at least 2 times per year   On track    Done on 08/13/2018 A1C 12.1    . Visit Primary Care Provider or Endocrinologist at least 2 times per year    On track    Office visit 08/10/2018      RNCM reinforced COVID019 precautions. Encouraged her to call RNCM as needed.  Plan: update care plan, send care plan to client and follow up in 3 months.   Thea Silversmith, RN, MSN, [place] [phone]    .

## 2018-08-28 ENCOUNTER — Other Ambulatory Visit: Payer: Self-pay | Admitting: Pharmacy Technician

## 2018-08-28 ENCOUNTER — Telehealth: Payer: Self-pay | Admitting: Internal Medicine

## 2018-08-28 MED ORDER — TOPIRAMATE 100 MG PO TABS: 100.0000 mg | ORAL_TABLET | Freq: Every day | ORAL | 0 refills | Status: DC

## 2018-08-28 NOTE — Patient Outreach (Signed)
[facility]) Care Management  08/28/2018  Shanita Kanan Crawford-Fewell [birth year] [id number]    Successful call placed to patient regarding patient assistance application(s) for Trulicity , HIPAA identifiers verified. Ms. Kueker confirms she received patient [place] application in today's mail. She states that she will fill out and mail it back in tomorrow.  Follow up:  Will submit completed application to company once document has been received.  Maud Deed Chana Bode [place] Certified Pharmacy Technician [place] Management Direct [phone]

## 2018-08-28 NOTE — Telephone Encounter (Signed)
Copied from [place] [phone]. Topic: Quick Communication - Rx Refill/Question >> Aug 28, 2018 11:57 AM Erick Blinks wrote: Medication: topiramate (TOPAMAX) 100 MG tablet -Pt states that will be out of medication by tonight.   Has the patient contacted their pharmacy? Yes.   (Agent: If no, request that the patient contact the pharmacy for the refill.) (Agent: If yes, when and what did the pharmacy advise?)  Preferred Pharmacy (with phone number or street name): [facility] [place], [place] - [address] [place] [address] Phone: [phone] Fax: [phone]    Agent: Please be advised that RX refills may take up to 3 business days. We ask that you follow-up with your pharmacy.

## 2018-09-03 ENCOUNTER — Other Ambulatory Visit: Payer: Self-pay | Admitting: Pharmacy Technician

## 2018-09-03 NOTE — Patient Outreach (Signed)
[facility]) Care Management  09/03/2018  Stacey [name] 1950/09/15 [id number]   Received patient portion(s) of patient assistance application for Trulicity. Faxed completed application and required documents into [employer].  Will follow up with company in 3-5 business days to check status of application.  Maud Deed Chana Bode [place] Certified Pharmacy Technician [place] Management Direct Dial:[phone]

## 2018-09-05 ENCOUNTER — Other Ambulatory Visit: Payer: Self-pay | Admitting: Pharmacy Technician

## 2018-09-05 NOTE — Patient Outreach (Signed)
[facility]) Care Management  09/05/2018  Myleah Cavendish Crawford-Fewell [birth year] [id number]    Follow up call placed to [facility]  regarding patient assistance application(s) for Trulicity , Tillie Rung confirms application has been received. Tillie Rung states that proof of income needs further clarification and that patient can call in to clarify it.   Successful call placed to patient regarding patient assistance update for Trulicity, HIPAA identifiers verified. Informed patient of above details and provided her with [employer] contact number. Requested that she contact me once she has verified her income so that I can follow up on the medication order status. She stated she would!  Maud Deed Chana Bode [place] Certified Pharmacy Technician [place] Management Direct Dial:[phone]

## 2018-09-07 ENCOUNTER — Other Ambulatory Visit: Payer: Self-pay | Admitting: Pharmacy Technician

## 2018-09-07 NOTE — Patient Outreach (Signed)
[facility] [facility]) Care Management  09/07/2018  Stacey [name] January 13, 1951 [id number]    Follow up call placed to [facility] regarding patient assistance application(s) for Trulicity , patient approved as of 8/11 until 01/24/19. Faxed script portion of application to [facility].  Follow up:  Will follow up with [facility] in 3-5 business days to check order status.  Maud Deed Chana Bode [place] Certified Pharmacy Technician [place] Management Direct Dial:[phone]

## 2018-09-12 ENCOUNTER — Encounter: Payer: Self-pay | Admitting: Internal Medicine

## 2018-09-12 DIAGNOSIS — E119 Type 2 diabetes mellitus without complications: Secondary | ICD-10-CM | POA: Diagnosis not present

## 2018-09-13 ENCOUNTER — Other Ambulatory Visit: Payer: Self-pay | Admitting: Pharmacy Technician

## 2018-09-13 NOTE — Patient Outreach (Signed)
[place] [facility]) Care Management  09/13/2018  Louanna Vanliew Crawford-Fewell 02-15-1950 [id number]    Follow up call placed to [facility] regarding patient assistance shipping details for Trulicity, Collie Siad confirms that medication is scheduled to arrive at patients home on 8/26.    Unsuccessful call placed to patient regarding patient assistance update for Trulicity, HIPAA compliant voicemail left.   Follow up:  Will follow up with patient in 5-7 business days if call has not been returned to confirm medication has been received.  Maud Deed Chana Bode [place] Certified Pharmacy Technician [place] Management Direct Dial:[phone]

## 2018-09-19 ENCOUNTER — Other Ambulatory Visit: Payer: Self-pay | Admitting: Internal Medicine

## 2018-09-19 ENCOUNTER — Encounter: Payer: PPO | Admitting: *Deleted

## 2018-09-19 DIAGNOSIS — E119 Type 2 diabetes mellitus without complications: Secondary | ICD-10-CM | POA: Diagnosis not present

## 2018-09-19 MED ORDER — GLIPIZIDE ER 10 MG PO TB24: ORAL_TABLET | ORAL | 0 refills | Status: DC

## 2018-09-19 NOTE — Telephone Encounter (Signed)
Medication Refill - Medication: glipiZIDE (GLUCOTROL XL) 10 MG 24 hr tablet   Has the patient contacted their pharmacy? Yes.   Pt states the pharmacy has tried to get In contact with office. Please advise as pt is out of medication.  (Agent: If no, request that the patient contact the pharmacy for the refill.) (Agent: If yes, when and what did the pharmacy advise?)  Preferred Pharmacy (with phone number or street name):  [facility], [place] - [address]  [place] [address]  Phone: [phone] Fax: [phone]  Not a 24 hour pharmacy; exact hours not known.     Agent: Please be advised that RX refills may take up to 3 business days. We ask that you follow-up with your pharmacy.

## 2018-09-24 ENCOUNTER — Other Ambulatory Visit: Payer: Self-pay | Admitting: Internal Medicine

## 2018-09-24 ENCOUNTER — Other Ambulatory Visit: Payer: Self-pay | Admitting: Pharmacist

## 2018-09-24 ENCOUNTER — Other Ambulatory Visit: Payer: Self-pay | Admitting: Pharmacy Technician

## 2018-09-24 DIAGNOSIS — Z1231 Encounter for screening mammogram for malignant neoplasm of breast: Secondary | ICD-10-CM

## 2018-09-24 NOTE — Patient Outreach (Signed)
[place]) Care Management  09/24/2018  Stacey [name] [birth year] [id number]    Successful call placed to patient regarding patient assistance medication delivery of Trulicity, HIPAA identifiers verified. [name] confirms she received medication from [facility]. Reviewed with patient on how to obtain refills and requested she contact me with any issues.  Follow up:  Will route note to [place] for case closure  Maud Deed. Chana Bode [place] Certified Pharmacy Technician [place] Management Direct [phone]

## 2018-09-24 NOTE — Patient Outreach (Signed)
[place] [facility]) Care Management [place]  09/24/2018  Stacey [name] 1950-12-12 [id number]  Patient has been approved for Trulicity patient assistance program(s).  Patient has been instructed on how to order refills and renewal process for 2020.    Plan: [place] case is being closed due to the following reasons: -Goals of care have been met. -I have provided my contact information if patient or family needs to reach out to me in the future.  -Thank you for allowing [facility] pharmacy to be involved in this patient's care.    Ralene Bathe, PharmD, [place] [phone]

## 2018-09-27 ENCOUNTER — Encounter: Payer: Self-pay | Admitting: Cardiology

## 2018-10-08 ENCOUNTER — Ambulatory Visit (INDEPENDENT_AMBULATORY_CARE_PROVIDER_SITE_OTHER): Payer: PPO | Admitting: *Deleted

## 2018-10-08 DIAGNOSIS — I442 Atrioventricular block, complete: Secondary | ICD-10-CM

## 2018-10-08 DIAGNOSIS — I447 Left bundle-branch block, unspecified: Secondary | ICD-10-CM

## 2018-10-09 LAB — CUP PACEART REMOTE DEVICE CHECK
Battery Remaining Longevity: 70 mo
Battery Voltage: 3 V
Brady Statistic AP VP Percent: 14.74 %
Brady Statistic AP VS Percent: 0 %
Brady Statistic AS VP Percent: 85.2 %
Brady Statistic AS VS Percent: 0.06 %
Brady Statistic RA Percent Paced: 14.73 %
Brady Statistic RV Percent Paced: 99.9 %
Date Time Interrogation Session: 20200914164706
Implantable Lead Implant Date: 20160203
Implantable Lead Implant Date: 20160203
Implantable Lead Location: 753859
Implantable Lead Location: 753860
Implantable Lead Model: 5076
Implantable Lead Model: 5076
Implantable Pulse Generator Implant Date: 20160203
Lead Channel Impedance Value: 323 Ohm
Lead Channel Impedance Value: 437 Ohm
Lead Channel Impedance Value: 456 Ohm
Lead Channel Impedance Value: 513 Ohm
Lead Channel Pacing Threshold Amplitude: 0.75 V
Lead Channel Pacing Threshold Amplitude: 1.125 V
Lead Channel Pacing Threshold Pulse Width: 0.4 ms
Lead Channel Pacing Threshold Pulse Width: 0.4 ms
Lead Channel Sensing Intrinsic Amplitude: 13 mV
Lead Channel Sensing Intrinsic Amplitude: 13 mV
Lead Channel Sensing Intrinsic Amplitude: 2.625 mV
Lead Channel Sensing Intrinsic Amplitude: 2.625 mV
Lead Channel Setting Pacing Amplitude: 2.25 V
Lead Channel Setting Pacing Amplitude: 2.5 V
Lead Channel Setting Pacing Pulse Width: 0.4 ms
Lead Channel Setting Sensing Sensitivity: 2.8 mV

## 2018-10-11 ENCOUNTER — Other Ambulatory Visit: Payer: Self-pay | Admitting: Internal Medicine

## 2018-10-11 MED ORDER — ATORVASTATIN CALCIUM 40 MG PO TABS: ORAL_TABLET | ORAL | 3 refills | Status: DC

## 2018-10-11 NOTE — Telephone Encounter (Signed)
Pt request refill   atorvastatin (LIPITOR) 40 MG tablet 90 day  Pt seen 08/10/18 and this was not refilled at that time. Pt called the pharmacy last week and they told her today we refused, but we did not get this request.   [facility] [place], [place] - [place] [phone] (Phone) [phone] (Fax)

## 2018-10-19 NOTE — Progress Notes (Signed)
Remote pacemaker transmission.   

## 2018-11-07 ENCOUNTER — Other Ambulatory Visit: Payer: Self-pay | Admitting: Internal Medicine

## 2018-11-07 MED ORDER — OXCARBAZEPINE 600 MG PO TABS: 600.0000 mg | ORAL_TABLET | Freq: Two times a day (BID) | ORAL | 1 refills | Status: DC

## 2018-11-07 MED ORDER — METOPROLOL SUCCINATE ER 100 MG PO TB24: 100.0000 mg | ORAL_TABLET | Freq: Every day | ORAL | 0 refills | Status: DC

## 2018-11-07 NOTE — Telephone Encounter (Signed)
Medication Refill - Medication: metoprolol succinate (TOPROL-XL) 100 MG 24 hr tablet, oxcarbazepine (TRILEPTAL) 600 MG tablet     Has the patient contacted their pharmacy? Yes.   Pt states she is out of the medication. Pt states she has reached out to the pharmacy and they state they have contacted the office with no response.  Please advise. (Agent: If no, request that the patient contact the pharmacy for the refill.) (Agent: If yes, when and what did the pharmacy advise?)  Preferred Pharmacy (with phone number or street name):  [place], [place] - [address]  [place] [address]  Phone: [phone] Fax: [phone]  Not a 24 hour pharmacy; exact hours not known.     Agent: Please be advised that RX refills may take up to 3 business days. We ask that you follow-up with your pharmacy.

## 2018-11-07 NOTE — Telephone Encounter (Signed)
Requested medication (s) are due for refill today: yes  Requested medication (s) are on the active medication list: yes  Last refill:  05/09/2018  Future visit scheduled: no  Notes to clinic:  Refill cannot be delegated    Requested Prescriptions  Pending Prescriptions Disp Refills   oxcarbazepine (TRILEPTAL) 600 MG tablet 180 tablet 1    Sig: Take 1 tablet (600 mg total) by mouth 2 (two) times daily.     Not Delegated - Neurology: Anticonvulsants - oxcarbazepine Failed - 11/07/2018  9:46 AM      Failed - This refill cannot be delegated      Failed - Na in normal range and within 360 days    Sodium  Date Value Ref Range Status  08/10/2018 133 (L) 135 - 145 mEq/L Final         Passed - WBC in normal range and within 360 days    WBC  Date Value Ref Range Status  08/10/2018 5.0 4.0 - 10.5 K/uL Final         Passed - PLT in normal range and within 360 days    Platelets  Date Value Ref Range Status  08/10/2018 204.0 150.0 - 400.0 K/uL Final  01/26/2017 241 145 - 400 10e3/uL Final         Passed - HCT in normal range and within 360 days    HCT  Date Value Ref Range Status  08/10/2018 39.3 36.0 - 46.0 % Final  01/26/2017 39.3 34.8 - 46.6 % Final         Passed - HGB in normal range and within 360 days    Hemoglobin  Date Value Ref Range Status  08/10/2018 12.6 12.0 - 15.0 g/dL Final   HGB  Date Value Ref Range Status  01/26/2017 12.3 11.6 - 15.9 g/dL Final         Passed - Valid encounter within last 12 months    Recent Outpatient Visits          2 months ago Preventative health care   [facility] -Clair Gulling, MD   7 months ago Pain of upper abdomen   [facility] -Clair Gulling, MD   8 months ago Essential hypertension   [facility] -Clair Gulling, MD   10 months ago Type 2 diabetes mellitus without complication, without long-term current use of insulin [facility])   [facility] -Clair Gulling, MD   1 year ago Preventative health care   [facility] -Clair Gulling, MD             Signed Prescriptions Disp Refills   metoprolol succinate (TOPROL-XL) 100 MG 24 hr tablet 90 tablet 0    Sig: Take 1 tablet (100 mg total) by mouth daily. Take with or immediately following a meal.     Cardiovascular:  Beta Blockers Passed - 11/07/2018  9:46 AM      Passed - Last BP in normal range    BP Readings from Last 1 Encounters:  08/10/18 130/86         Passed - Last Heart Rate in normal range    Pulse Readings from Last 1 Encounters:  08/10/18 76         Passed - Valid encounter within last 6 months    Recent Outpatient Visits          2 months ago Preventative  health care   [place] [facility], James W, MD   7 months ago Pain of upper abdomen   [place] -Georges Mouse, MD   8 months ago Essential hypertension   [place], James W, MD   10 months ago Type 2 diabetes mellitus without complication, without long-term current use of insulin [facility])   [facility] -Georges Mouse, MD   1 year ago Preventative health care   [facility] -Georges Mouse, MD

## 2018-11-08 ENCOUNTER — Other Ambulatory Visit: Payer: Self-pay

## 2018-11-08 ENCOUNTER — Ambulatory Visit (INDEPENDENT_AMBULATORY_CARE_PROVIDER_SITE_OTHER): Payer: Self-pay | Admitting: Student

## 2018-11-08 DIAGNOSIS — I442 Atrioventricular block, complete: Secondary | ICD-10-CM

## 2018-11-08 LAB — CUP PACEART INCLINIC DEVICE CHECK
Battery Remaining Longevity: 71 mo
Battery Voltage: 3 V
Brady Statistic AP VP Percent: 12.09 %
Brady Statistic AP VS Percent: 0 %
Brady Statistic AS VP Percent: 87.85 %
Brady Statistic AS VS Percent: 0.06 %
Brady Statistic RA Percent Paced: 12.08 %
Brady Statistic RV Percent Paced: 99.89 %
Date Time Interrogation Session: 20201015112615
Implantable Lead Implant Date: 20160203
Implantable Lead Implant Date: 20160203
Implantable Lead Location: 753859
Implantable Lead Location: 753860
Implantable Lead Model: 5076
Implantable Lead Model: 5076
Implantable Pulse Generator Implant Date: 20160203
Lead Channel Impedance Value: 342 Ohm
Lead Channel Impedance Value: 475 Ohm
Lead Channel Impedance Value: 494 Ohm
Lead Channel Impedance Value: 589 Ohm
Lead Channel Pacing Threshold Amplitude: 0.625 V
Lead Channel Pacing Threshold Amplitude: 1 V
Lead Channel Pacing Threshold Pulse Width: 0.4 ms
Lead Channel Pacing Threshold Pulse Width: 0.4 ms
Lead Channel Sensing Intrinsic Amplitude: 2.625 mV
Lead Channel Sensing Intrinsic Amplitude: 3 mV
Lead Channel Sensing Intrinsic Amplitude: 5.375 mV
Lead Channel Sensing Intrinsic Amplitude: 7.125 mV
Lead Channel Setting Pacing Amplitude: 2.25 V
Lead Channel Setting Pacing Amplitude: 2.5 V
Lead Channel Setting Pacing Pulse Width: 0.4 ms
Lead Channel Setting Sensing Sensitivity: 2 mV

## 2018-11-08 NOTE — Progress Notes (Signed)
Pacemaker check in clinic with V undersensing during  NSVT. Thresholds and impedances consistent with previous measurements. RV sensitivity changed from 2.8 mV to 2.0 mV.  2 NSVT previously noted. Device programmed at appropriate safety margins. Histograms appropriate for patient activity level. Estimated longevity 5 yr, 11 mo. Patient enrolled in remote follow-ups. Due for office follow up with Dr. Caryl Comes. Will forward to scheduling.

## 2018-11-09 ENCOUNTER — Ambulatory Visit: Payer: PPO

## 2018-11-21 ENCOUNTER — Other Ambulatory Visit: Payer: Self-pay

## 2018-11-21 NOTE — Patient Outreach (Signed)
  [facility]) Care Management Chronic Special Needs Program    11/21/2018  Name: PHILENA OBEY, DOB: January 12, 1951  MRN: [id number]   Ms. Aggie Crawford-Fewell is enrolled in a chronic special needs plan. RNCM called to follow up. No answer-unable to leave message.  Plan: RNCM will attempt next outreach in 1-2 weeks.  Thea Silversmith, RN, MSN, [place] [phone]

## 2018-11-22 ENCOUNTER — Other Ambulatory Visit: Payer: Self-pay

## 2018-11-22 NOTE — Patient Outreach (Signed)
  [place] [facility] [facility]) Care Management Chronic Special Needs Program  11/22/2018  Name: Stacey [name] DOB: 04-26-1950  MRN: [id number]  Ms. Bradyn [name] is enrolled in a chronic special needs plan for Diabetes. RNCM called to follow up.  Reviewed and updated care plan.  Subjective: client reports her blood sugars are "wonderful" she reports her blood sugars have improved and they have been ranging less than 120. She reports she is able to get her medications without difficulty. Last A1C 12.25 July 2018. She denies any questions or concerns.  Goals Addressed            This Visit's Progress   . COMPLETED: Client understands the importance of follow-up with providers by attending scheduled visits       Voiced understanding of attending provider visits.    . COMPLETED: Client verbalize knowledge of Heart Failure disease self management skills within 6-9 months.       Take Medications as prescribed. Monitor Salt and fluid volume as recommended. Attend Follow up appointments as scheduled. Monitors and keep track of weights.    . COMPLETED: Client verbalizes knowledge of Heart Attack self management skills within 3 months.       Reports takes medications as prescribed, attends scheduled follow up visits.    . COMPLETED: Client will report abillity to obtain Medications within 6 months.       Denies difficulty obtaining medications.    . COMPLETED: Client will report no worsening of symptoms of Atrial Fibrillation within the next 3 months       Denies any issues.    . COMPLETED: Client will report no worsening of symptoms related to heart disease within the next 6-9 months.       Denies any issues.    . COMPLETED: Client will verbalize knowledge of self management of Hypertension as evidences by BP reading of 140/90 or less; or as defined by provider       Reports takes medications, attends follow up visit. Per note blood pressure reading <140/90.    Marland Kitchen COMPLETED: Maintain timely refills of diabetic medication as prescribed within the year .       Reports she is able to obtain medications.    . Obtain Annual Eye (retinal)  Exam    On track   . Obtain Annual Foot Exam   On track   . Obtain Hemoglobin A1C at least 2 times per year   On track    Done on 08/13/2018 A1C 12.1    . COMPLETED: Visit Primary Care Provider or Endocrinologist at least 2 times per year    On track    As seen provider at least twice/year.      Covid 19 precautions reviewed. RNCM reinforced availability of 24 hour nurse advices line; encouraged client to contact health care concierge as needed for benefits questions.  Plan: RNCM will follow up within the next 3-6 months.     Thea Silversmith, RN, MSN, [place] [phone]   .

## 2018-11-26 ENCOUNTER — Other Ambulatory Visit: Payer: Self-pay | Admitting: Internal Medicine

## 2018-11-26 MED ORDER — TOPIRAMATE 100 MG PO TABS: 100.0000 mg | ORAL_TABLET | Freq: Every day | ORAL | 0 refills | Status: DC

## 2018-11-26 NOTE — Telephone Encounter (Signed)
Pt request refill   topiramate (TOPAMAX) 100 MG tablet  [id number] - [place], [place] [address] [place] [phone] (Phone) [phone] (Fax)

## 2018-11-26 NOTE — Telephone Encounter (Signed)
Requested medication (s) are due for refill today: yes  Requested medication (s) are on the active medication list: yes  Last refill:  08/28/2018  Future visit scheduled: yes  Notes to clinic:  Refill cannot be delegated  Patient will be out of medication tonight   Requested Prescriptions  Pending Prescriptions Disp Refills   topiramate (TOPAMAX) 100 MG tablet 90 tablet 0    Sig: Take 1 tablet (100 mg total) by mouth daily.     Not Delegated - Neurology: Anticonvulsants - topiramate & zonisamide Failed - 11/26/2018  8:38 AM      Failed - This refill cannot be delegated      Passed - Cr in normal range and within 360 days    Creat  Date Value Ref Range Status  12/02/2013 0.81 0.50 - 1.10 mg/dL Final   Creatinine, Ser  Date Value Ref Range Status  08/10/2018 1.17 0.40 - 1.20 mg/dL Final         Passed - CO2 in normal range and within 360 days    CO2  Date Value Ref Range Status  08/10/2018 28 19 - 32 mEq/L Final         Passed - Valid encounter within last 12 months    Recent Outpatient Visits          3 months ago Preventative health care   [place] [facility], James W, MD   8 months ago Pain of upper abdomen   [place], James W, MD   9 months ago Essential hypertension   [place], James W, MD   10 months ago Type 2 diabetes mellitus without complication, without long-term current use of insulin [facility])   [facility] -Georges Mouse, MD   1 year ago Preventative health care   [facility] -Georges Mouse, MD

## 2018-11-27 ENCOUNTER — Ambulatory Visit: Payer: Self-pay

## 2018-12-17 ENCOUNTER — Other Ambulatory Visit: Payer: Self-pay | Admitting: Internal Medicine

## 2018-12-17 MED ORDER — GLIPIZIDE ER 10 MG PO TB24: ORAL_TABLET | ORAL | 0 refills | Status: DC

## 2018-12-17 NOTE — Telephone Encounter (Signed)
Medication Refill - Medication: glipiZIDE (GLUCOTROL XL) 10 MG 24 hr tablet  Pt contacted her pharmacy last week and pharmacy stated they received no response. Pt took her last tablet today  Has the patient contacted their pharmacy? Yes.   (Agent: If no, request that the patient contact the pharmacy for the refill.) (Agent: If yes, when and what did the pharmacy advise?)  Preferred Pharmacy (with phone number or street name):  [facility] [place], [place] - [address] [place] [phone] (Phone) [phone] (Fax)     Agent: Please be advised that RX refills may take up to 3 business days. We ask that you follow-up with your pharmacy.

## 2018-12-31 ENCOUNTER — Other Ambulatory Visit: Payer: Self-pay

## 2018-12-31 ENCOUNTER — Ambulatory Visit
Admission: RE | Admit: 2018-12-31 | Discharge: 2018-12-31 | Disposition: A | Discharge status: Home / Self Care | Payer: HMO | Source: Ambulatory Visit | Attending: Internal Medicine | Admitting: Internal Medicine

## 2018-12-31 DIAGNOSIS — Z1231 Encounter for screening mammogram for malignant neoplasm of breast: Secondary | ICD-10-CM | POA: Diagnosis not present

## 2019-01-07 ENCOUNTER — Ambulatory Visit (INDEPENDENT_AMBULATORY_CARE_PROVIDER_SITE_OTHER): Payer: HMO | Admitting: *Deleted

## 2019-01-07 DIAGNOSIS — Z959 Presence of cardiac and vascular implant and graft, unspecified: Secondary | ICD-10-CM | POA: Diagnosis not present

## 2019-01-10 LAB — CUP PACEART REMOTE DEVICE CHECK
Battery Remaining Longevity: 68 mo
Battery Voltage: 3 V
Brady Statistic AP VP Percent: 6.04 %
Brady Statistic AP VS Percent: 0 %
Brady Statistic AS VP Percent: 93.85 %
Brady Statistic AS VS Percent: 0.11 %
Brady Statistic RA Percent Paced: 6.04 %
Brady Statistic RV Percent Paced: 99.84 %
Date Time Interrogation Session: 20201217130737
Implantable Lead Implant Date: 20160203
Implantable Lead Implant Date: 20160203
Implantable Lead Location: 753859
Implantable Lead Location: 753860
Implantable Lead Model: 5076
Implantable Lead Model: 5076
Implantable Pulse Generator Implant Date: 20160203
Lead Channel Impedance Value: 323 Ohm
Lead Channel Impedance Value: 456 Ohm
Lead Channel Impedance Value: 513 Ohm
Lead Channel Impedance Value: 589 Ohm
Lead Channel Pacing Threshold Amplitude: 0.75 V
Lead Channel Pacing Threshold Amplitude: 1.125 V
Lead Channel Pacing Threshold Pulse Width: 0.4 ms
Lead Channel Pacing Threshold Pulse Width: 0.4 ms
Lead Channel Sensing Intrinsic Amplitude: 13.625 mV
Lead Channel Sensing Intrinsic Amplitude: 13.625 mV
Lead Channel Sensing Intrinsic Amplitude: 2.375 mV
Lead Channel Sensing Intrinsic Amplitude: 2.375 mV
Lead Channel Setting Pacing Amplitude: 2.25 V
Lead Channel Setting Pacing Amplitude: 2.5 V
Lead Channel Setting Pacing Pulse Width: 0.4 ms
Lead Channel Setting Sensing Sensitivity: 2 mV

## 2019-01-30 ENCOUNTER — Ambulatory Visit (INDEPENDENT_AMBULATORY_CARE_PROVIDER_SITE_OTHER): Payer: HMO | Admitting: Internal Medicine

## 2019-01-30 ENCOUNTER — Other Ambulatory Visit: Payer: Self-pay

## 2019-01-30 ENCOUNTER — Encounter: Payer: Self-pay | Admitting: Internal Medicine

## 2019-01-30 VITALS — BP 142/84 | HR 80 | Ht 69.0 in | Wt 196.2 lb

## 2019-01-30 DIAGNOSIS — I421 Obstructive hypertrophic cardiomyopathy: Secondary | ICD-10-CM

## 2019-01-30 DIAGNOSIS — I447 Left bundle-branch block, unspecified: Secondary | ICD-10-CM

## 2019-01-30 DIAGNOSIS — I442 Atrioventricular block, complete: Secondary | ICD-10-CM | POA: Diagnosis not present

## 2019-01-30 LAB — CUP PACEART INCLINIC DEVICE CHECK
Battery Remaining Longevity: 62 mo
Battery Voltage: 3 V
Brady Statistic AP VP Percent: 4.98 %
Brady Statistic AP VS Percent: 0 %
Brady Statistic AS VP Percent: 94.92 %
Brady Statistic AS VS Percent: 0.1 %
Brady Statistic RA Percent Paced: 4.98 %
Brady Statistic RV Percent Paced: 99.86 %
Date Time Interrogation Session: 20210106095700
Implantable Lead Implant Date: 20160203
Implantable Lead Implant Date: 20160203
Implantable Lead Location: 753859
Implantable Lead Location: 753860
Implantable Lead Model: 5076
Implantable Lead Model: 5076
Implantable Pulse Generator Implant Date: 20160203
Lead Channel Impedance Value: 361 Ohm
Lead Channel Impedance Value: 494 Ohm
Lead Channel Impedance Value: 513 Ohm
Lead Channel Impedance Value: 589 Ohm
Lead Channel Pacing Threshold Amplitude: 0.75 V
Lead Channel Pacing Threshold Amplitude: 1 V
Lead Channel Pacing Threshold Pulse Width: 0.4 ms
Lead Channel Pacing Threshold Pulse Width: 0.4 ms
Lead Channel Sensing Intrinsic Amplitude: 3.25 mV
Lead Channel Setting Pacing Amplitude: 2.25 V
Lead Channel Setting Pacing Amplitude: 2.5 V
Lead Channel Setting Pacing Pulse Width: 0.4 ms
Lead Channel Setting Sensing Sensitivity: 2 mV

## 2019-01-30 NOTE — Progress Notes (Signed)
Patient Care Team: Biagio Borg, MD as PCP - General (Internal Medicine) Deboraha Sprang, MD as Consulting Physician (Cardiology) Gatha Mayer, MD as Consulting Physician (Gastroenterology) Luretha Rued, RN as [place] Management   HPI  Stacey [name] is a 69 y.o. female Seen in followup for palpitations and near syncope in the setting of hypertrophic cardiomyopathy with prior myectomy;  loop recorder demonstrated intermittent complete heart block and she underwent pacing 1/16.(Medtronic ) Now with CHB  The patient denies chest pain, shortness of breath, nocturnal dyspnea, orthopnea or peripheral edema.  There have been no palpitations, lightheadedness or syncope.    Lost her husband to cancer summer 2020   Past Medical History:  Diagnosis Date  . ANGIOEDEMA 03/07/2008   a. with ACE-I  . ANXIETY 09/16/2006  . ASTHMA 08/01/2006  . CAD (coronary artery disease)    a. 01/2010 : Minimal plaque at cardiac catheterization - CFX 20%, EF 70%;  b. 08/2012 Cath: Nl Cors, EF 65%.  . Chronic diastolic CHF (congestive heart failure) ([place]) 04/06/2010   a. In setting of HOCM.  Marland Kitchen Complete heart block ([place]) 02/26/2014   a. s/p MDT dual chamber pacemaker 02/2014  . COPD (chronic obstructive pulmonary disease) ([place])   . DEPRESSION 09/16/2006  . DIVERTICULOSIS, COLON, WITH HEMORRHAGE 04/06/2010  . DM (diabetes mellitus) in pregnancy, delivered w/postpartum condition 08/30/2010  . HYPERLIPIDEMIA 08/01/2006  . HYPERTENSION 08/01/2006  . 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 s/p septal myomectomy 4/12 at [place] with Dr. Evelina Dun;  b. echo 5/12: EF 60-65%, LVOT peak 18 mmHg; grade 1 diast dysfxn, mild SAM (improved since myomectomy;  c. 03/2012 Echo: EF 60-65%, mod-sev basal septal asymm hypertrophy, basal septal HK, LVOT grad 56mHg, Gr 1 DD, , SAM, Mild MR, nl RV, PASP 318mg; 08/2012 Echo: technically difficult, doubt LVOT obstruction, EF 60%, Gr 1 DD, mod-sev dil LA.  .  Impaired glucose tolerance 06/25/2010  . LBBB (left bundle branch block) 06/17/2010  . OBESITY 09/16/2006  . 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 SICKLE CELL ANEMIA 08/01/2006  . Type II or unspecified type diabetes mellitus without mention of complication, uncontrolled 08/30/2010  . VENTRAL HERNIA 12/07/2006    Past Surgical History:  Procedure Laterality Date  . ABDOMINAL HYSTERECTOMY  1987  . CARDIAC SURGERY    . COLONOSCOPY N/A 12/26/2016   Procedure: COLONOSCOPY;  Surgeon: JaMilus BanisterMD;  Location: [facility];  Service: Endoscopy;  Laterality: N/A;  . ELECTROPHYSIOLOGY STUDY N/A 09/12/2012   Procedure: ELECTROPHYSIOLOGY STUDY;  Surgeon: StDeboraha SprangMD;  [facility];  Service: Cardiovascular;  Laterality: N/A;  . LEFT HEART CATH  Aug. 18, 2014   Medtronic heart device  . LEFT HEART CATHETERIZATION WITH CORONARY ANGIOGRAM N/A 09/10/2012   Procedure: LEFT HEART CATHETERIZATION WITH CORONARY ANGIOGRAM;  Surgeon: ChBurnell BlanksMD;  [facility];  Service: Cardiovascular;  Laterality: N/A;  . MYOMECTOMY     Septal  . PERMANENT PACEMAKER INSERTION N/A 02/26/2014   MDT Advisa dual chamber pacemaker implanted by Dr KlCaryl Comesor heart block  . VENTRAL HERNIA REPAIR  06/2006    Current Outpatient Medications  Medication Sig Dispense Refill  . acetaminophen (TYLENOL) 500 MG tablet Take 500 mg by mouth daily as needed.    . Marland Kitchenlbuterol (VENTOLIN HFA) 108 (90 Base) MCG/ACT inhaler Inhale 2 puffs into the lungs every 6 (six) hours as needed for wheezing or shortness of breath. [place]  g 5  . amLODipine (NORVASC) 10 MG tablet Take 1 tablet (10 mg total) by mouth daily. 90 tablet 3  . atorvastatin (LIPITOR) 40 MG tablet TAKE 1 TABLET(40 MG) BY MOUTH DAILY 90 tablet 3  . Blood Glucose Monitoring Suppl (ONE TOUCH ULTRA 2) W/DEVICE KIT Use to check blood sugars daily Dx E11.9 1 each 0  . cetirizine (ZYRTEC) 10 MG tablet Take 10 mg by mouth daily.      . clonazePAM (KLONOPIN) 0.5 MG  tablet TAKE 1 TABLET BY MOUTH TWICE DAILY AS NEEDED FOR ANXIETY 30 tablet 2  . Dulaglutide (TRULICITY) 1.19 JY/7.8GN SOPN Inject 0.5 mLs into the skin once a week. 6 mL 3  . fenofibrate (TRICOR) 145 MG tablet Take 1 tablet (145 mg total) by mouth daily. 90 tablet 3  . glipiZIDE (GLUCOTROL XL) 10 MG 24 hr tablet TAKE 1 TABLET BY MOUTH EVERY DAY WITH BREAKFAST 90 tablet 0  . glucose blood test strip 1 each by Other route daily. Use to check blood sugar daily Dx e11.9 100 each 11  . hydrochlorothiazide (MICROZIDE) 12.5 MG capsule Take 1 capsule (12.5 mg total) by mouth daily. 90 capsule 3  . hydrocortisone (ANUSOL-HC) 2.5 % rectal cream Place 1 application rectally as needed for hemorrhoids or anal itching.    . latanoprost (XALATAN) 0.005 % ophthalmic solution Place 1 drop into both eyes at bedtime.  3  . losartan (COZAAR) 100 MG tablet Take 1 tablet (100 mg total) by mouth daily. 90 tablet 3  . meclizine (ANTIVERT) 12.5 MG tablet Take 1 tablet (12.5 mg total) by mouth 3 (three) times daily as needed for dizziness. 40 tablet 2  . metoprolol succinate (TOPROL-XL) 100 MG 24 hr tablet Take 1 tablet (100 mg total) by mouth daily. Take with or immediately following a meal. 90 tablet 0  . ondansetron (ZOFRAN ODT) 4 MG disintegrating tablet Take 1 tablet (4 mg total) by mouth every 8 (eight) hours as needed for nausea. 10 tablet 0  . ONETOUCH DELICA LANCETS 56O MISC USE TO HELP CHECK BLOOD SUGAR DAILY 200 each 0  . oxcarbazepine (TRILEPTAL) 600 MG tablet Take 1 tablet (600 mg total) by mouth 2 (two) times daily. 180 tablet 1  . pantoprazole (PROTONIX) 40 MG tablet Take 1 tablet (40 mg total) by mouth daily. 90 tablet 3  . pioglitazone (ACTOS) 45 MG tablet Take 1 tablet (45 mg total) by mouth daily. 90 tablet 3  . potassium chloride (K-DUR) 10 MEQ tablet Take 2 tablets (20 mEq total) by mouth daily. 180 tablet 3  . prochlorperazine (COMPAZINE) 10 MG tablet Take 1 tablet (10 mg total) by mouth every 6 (six)  hours as needed for nausea or vomiting. 30 tablet 1  . topiramate (TOPAMAX) 100 MG tablet Take 1 tablet (100 mg total) by mouth daily. 90 tablet 0   No current facility-administered medications for this visit.    Allergies  Allergen Reactions  . Dairy Aid [Lactase] Diarrhea and Other (See Comments)    Bloating and gastric distress  . Metformin And Related Other (See Comments)    GI upset  . Ace Inhibitors Other (See Comments)    REACTION: angioedema right eye  . Aspirin Other (See Comments)    Bleeding   . Codeine Other (See Comments)    Hallucinations, can take if with someone.  . Soy Allergy Other (See Comments)    Bleeding & cramps, diarrhea     Review of Systems negative except from HPI and  PMH  Physical Exam BP (!) 142/84   Pulse 80   Ht 5' 9" (1.753 m)   Wt 196 lb 3.2 oz (89 kg)   SpO2 98%   BMI 28.97 kg/m  Well developed and well nourished in no acute distress HENT normal Neck supple with JVP-flat Clear Device pocket well healed; without hematoma or erythema.  There is no tethering  Regular rate and rhythm, no  murmur Abd-soft with active BS No Clubbing cyanosis   edema Skin-warm and dry A & Oriented  Grossly normal sensory and motor function  ECG sinus with P-synchronous/ AV  pacing    Assessment and  Plan  HCM  S/p myectomy  Left bundle branch block  Complete heart block  Pacemaker-Medtronic  The patient's device was interrogated and the information was fully reviewed.  The device was reprogrammed to eliminate MVP  Hypertension  Grief   BP elevated but a lot of stress  Pacemaker function is normal.  Discussed the possible husband and loneliness.  Isolated.  Strategies of engagement.  We spent more than 50% of our >30 min visit in face to face counseling regarding the above

## 2019-01-30 NOTE — Patient Instructions (Signed)

## 2019-02-01 ENCOUNTER — Other Ambulatory Visit: Payer: Self-pay | Admitting: Internal Medicine

## 2019-02-01 ENCOUNTER — Other Ambulatory Visit: Payer: Self-pay

## 2019-02-01 MED ORDER — METOPROLOL SUCCINATE ER 100 MG PO TB24: 100.0000 mg | ORAL_TABLET | Freq: Every day | ORAL | 0 refills | Status: DC

## 2019-02-11 ENCOUNTER — Telehealth: Payer: Self-pay | Admitting: Internal Medicine

## 2019-02-11 NOTE — Telephone Encounter (Signed)
Copied from CRM [id number]. Topic: General - Other >> Feb 11, 2019  1:22 PM Dalphine Handing A wrote: Patient would like a callback from [facility] in regards to patients Trulicity medication being renewed for [facility]. Patient would like to know if she needs to provide forms for Dr. Jonny Ruiz or if renewal can be complete with information given. Please advise

## 2019-02-13 NOTE — Telephone Encounter (Signed)
I called [employer]. Patient needs to complete a new assistance form. She has completed all her "patient sections". She is requesting we complete the prescriber section and fax those 2 pages to the number on the form. Form given to PCP for completion and signature.

## 2019-02-13 NOTE — Telephone Encounter (Signed)
Prescriber sections completed and signed by PCP. Form faxed to [facility] @ number on form and sent to scan in chart.

## 2019-02-14 ENCOUNTER — Ambulatory Visit: Payer: Self-pay

## 2019-02-19 ENCOUNTER — Other Ambulatory Visit: Payer: Self-pay | Admitting: Internal Medicine

## 2019-02-19 NOTE — Telephone Encounter (Signed)
   Please refax form to [facility] using current days date. The form was faxed using last year date

## 2019-02-25 NOTE — Telephone Encounter (Addendum)
See form scanned in "media" on 02/22/19. It states 02/13/19. I re-printed and re-faxed form to number on form.

## 2019-02-26 ENCOUNTER — Other Ambulatory Visit: Payer: Self-pay | Admitting: Internal Medicine

## 2019-02-26 NOTE — Telephone Encounter (Signed)
Please refill as per office routine med refill policy (all routine meds refilled for 3 mo or monthly per pt preference up to one year from last visit, then month to month grace period for 3 mo, then further med refills will have to be denied)  

## 2019-03-06 ENCOUNTER — Telehealth: Payer: Self-pay | Admitting: Internal Medicine

## 2019-03-06 NOTE — Telephone Encounter (Signed)
    Patient requesting sample of Dulaglutide (TRULICITY) 0.75 MG/0.5ML SOPN  Also would like to know if she should take Covid vaccine

## 2019-03-06 NOTE — Telephone Encounter (Signed)
Pt DOXY appointment scheduled for 2/12. Email address has changed and is now [email or web address]

## 2019-03-08 ENCOUNTER — Encounter: Payer: Self-pay | Admitting: Internal Medicine

## 2019-03-08 ENCOUNTER — Ambulatory Visit (INDEPENDENT_AMBULATORY_CARE_PROVIDER_SITE_OTHER): Payer: HMO | Admitting: Internal Medicine

## 2019-03-08 ENCOUNTER — Telehealth: Payer: Self-pay | Admitting: Internal Medicine

## 2019-03-08 DIAGNOSIS — Z0000 Encounter for general adult medical examination without abnormal findings: Secondary | ICD-10-CM

## 2019-03-08 DIAGNOSIS — E119 Type 2 diabetes mellitus without complications: Secondary | ICD-10-CM

## 2019-03-08 NOTE — Progress Notes (Signed)
Patient ID: Stacey [name], female   DOB: 1950/05/01, 69 y.o.   MRN: [id number]  Virtual Visit via Video Note  I connected with Stacey [name] on 03/08/19 at  8:40 AM EST by a video enabled telemedicine application and verified that I am speaking with the correct person using two identifiers.  Location: Patient: at home Provider: at office   I discussed the limitations of evaluation and management by telemedicine and the availability of in person appointments. The patient expressed understanding and agreed to proceed.  History of Present Illness: Here for wellness and f/u;  Overall doing ok;  Pt denies Chest pain, worsening SOB, DOE, wheezing, orthopnea, PND, worsening LE edema, palpitations, dizziness or syncope.  Pt denies neurological change such as new headache, facial or extremity weakness.  Pt denies polydipsia, polyuria, or low sugar symptoms. Pt states overall good compliance with treatment and medications, good tolerability, and has been trying to follow appropriate diet.  Pt denies worsening depressive symptoms, suicidal ideation or panic. No fever, night sweats, wt loss, loss of appetite, or other constitutional symptoms.  Pt states good ability with ADL's, has low fall risk, home safety reviewed and adequate, no other significant changes in hearing or vision, and only occasionally active with exercise.  Much better with the diet recently.  Wt about the same, maybe up a few lbs.  CBGs in the low 100s.  Has been self isolating for many months even having groceries delivered.  Daughter and grandchild moving in next wk into her relatively large house. No new complaints Past Medical History:  Diagnosis Date  . ANGIOEDEMA 03/07/2008   a. with ACE-I  . ANXIETY 09/16/2006  . ASTHMA 08/01/2006  . CAD (coronary artery disease)    a. 01/2010 : Minimal plaque at cardiac catheterization - CFX 20%, EF 70%;  b. 08/2012 Cath: Nl Cors, EF 65%.  . Chronic diastolic CHF (congestive  heart failure) ([place]) 04/06/2010   a. In setting of HOCM.  Marland Kitchen Complete heart block ([place]) 02/26/2014   a. s/p MDT dual chamber pacemaker 02/2014  . COPD (chronic obstructive pulmonary disease) ([place])   . DEPRESSION 09/16/2006  . DIVERTICULOSIS, COLON, WITH HEMORRHAGE 04/06/2010  . DM (diabetes mellitus) in pregnancy, delivered w/postpartum condition 08/30/2010  . HYPERLIPIDEMIA 08/01/2006  . HYPERTENSION 08/01/2006  . 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 s/p septal myomectomy 4/12 at [facility] with Dr. Evelina Dun;  b. echo 5/12: EF 60-65%, LVOT peak 18 mmHg; grade 1 diast dysfxn, mild SAM (improved since myomectomy;  c. 03/2012 Echo: EF 60-65%, mod-sev basal septal asymm hypertrophy, basal septal HK, LVOT grad 22mHg, Gr 1 DD, , SAM, Mild MR, nl RV, PASP 327mg; 08/2012 Echo: technically difficult, doubt LVOT obstruction, EF 60%, Gr 1 DD, mod-sev dil LA.  . Impaired glucose tolerance 06/25/2010  . LBBB (left bundle branch block) 06/17/2010  . OBESITY 09/16/2006  . Renal artery aneurysm ([place]  . SICKLE CELL ANEMIA 08/01/2006  . Type II or unspecified type diabetes mellitus without mention of complication, uncontrolled 08/30/2010  . VENTRAL HERNIA 12/07/2006   Past Surgical History:  Procedure Laterality Date  . ABDOMINAL HYSTERECTOMY  1987  . CARDIAC SURGERY    . COLONOSCOPY N/A 12/26/2016   Procedure: COLONOSCOPY;  Surgeon: JaMilus BanisterMD;  Location: [facility];  Service: Endoscopy;  Laterality: N/A;  . ELECTROPHYSIOLOGY STUDY N/A 09/12/2012   Procedure: ELECTROPHYSIOLOGY STUDY;  Surgeon: StDeboraha SprangMD;  [facility];  Service: Cardiovascular;  Laterality: N/A;  .  LEFT HEART CATH  Aug. 18, 2014   Medtronic heart device  . LEFT HEART CATHETERIZATION WITH CORONARY ANGIOGRAM N/A 09/10/2012   Procedure: LEFT HEART CATHETERIZATION WITH CORONARY ANGIOGRAM;  Surgeon: Burnell Blanks, MD;  Location: [facility] CATH LAB;  Service: Cardiovascular;  Laterality: N/A;  . MYOMECTOMY     Septal  . PERMANENT  PACEMAKER INSERTION N/A 02/26/2014   MDT Advisa dual chamber pacemaker implanted by Dr Caryl Comes for heart block  . VENTRAL HERNIA REPAIR  06/2006    reports that she quit smoking about 21 years ago. Her smoking use included cigarettes. She has a 5.00 pack-year smoking history. She has never used smokeless tobacco. She reports that she does not drink alcohol or use drugs. family history includes Cancer in her daughter; Clotting disorder in her daughter and another family member; Colon cancer in her daughter; Diabetes in her paternal grandmother; Heart attack in her father and sister; Heart disease in her daughter and father; Hyperlipidemia in her brother, daughter, and sister; Hypertension in her brother, father, sister, and son; Stroke in her sister. She was adopted. Allergies  Allergen Reactions  . Dairy Aid [Lactase] Diarrhea and Other (See Comments)    Bloating and gastric distress  . Metformin And Related Other (See Comments)    GI upset  . Ace Inhibitors Other (See Comments)    REACTION: angioedema right eye  . Aspirin Other (See Comments)    Bleeding   . Codeine Other (See Comments)    Hallucinations, can take if with someone.  . Soy Allergy Other (See Comments)    Bleeding & cramps, diarrhea    Current Outpatient Medications on File Prior to Visit  Medication Sig Dispense Refill  . acetaminophen (TYLENOL) 500 MG tablet Take 500 mg by mouth daily as needed.    Marland Kitchen albuterol (VENTOLIN HFA) 108 (90 Base) MCG/ACT inhaler Inhale 2 puffs into the lungs every 6 (six) hours as needed for wheezing or shortness of breath. 18 g 5  . amLODipine (NORVASC) 10 MG tablet Take 1 tablet (10 mg total) by mouth daily. 90 tablet 3  . atorvastatin (LIPITOR) 40 MG tablet TAKE 1 TABLET(40 MG) BY MOUTH DAILY 90 tablet 3  . Blood Glucose Monitoring Suppl (ONE TOUCH ULTRA 2) W/DEVICE KIT Use to check blood sugars daily Dx E11.9 1 each 0  . cetirizine (ZYRTEC) 10 MG tablet Take 10 mg by mouth daily.      .  clonazePAM (KLONOPIN) 0.5 MG tablet TAKE 1 TABLET BY MOUTH TWICE DAILY AS NEEDED FOR ANXIETY 30 tablet 2  . Dulaglutide (TRULICITY) 6.29 BM/8.4XL SOPN Inject 0.5 mLs into the skin once a week. 6 mL 3  . fenofibrate (TRICOR) 145 MG tablet Take 1 tablet (145 mg total) by mouth daily. 90 tablet 3  . glipiZIDE (GLUCOTROL XL) 10 MG 24 hr tablet TAKE 1 TABLET BY MOUTH EVERY DAY WITH BREAKFAST 90 tablet 0  . glucose blood test strip 1 each by Other route daily. Use to check blood sugar daily Dx e11.9 100 each 11  . hydrochlorothiazide (MICROZIDE) 12.5 MG capsule TAKE 1 CAPSULE BY MOUTH DAILY 90 capsule 1  . hydrocortisone (ANUSOL-HC) 2.5 % rectal cream Place 1 application rectally as needed for hemorrhoids or anal itching.    . latanoprost (XALATAN) 0.005 % ophthalmic solution Place 1 drop into both eyes at bedtime.  3  . losartan (COZAAR) 100 MG tablet TAKE 1 TABLET BY MOUTH DAILY 90 tablet 1  . meclizine (ANTIVERT) 12.5 MG tablet Take  1 tablet (12.5 mg total) by mouth 3 (three) times daily as needed for dizziness. 40 tablet 2  . metoprolol succinate (TOPROL-XL) 100 MG 24 hr tablet Take 1 tablet (100 mg total) by mouth daily. Take with or immediately following a meal. 90 tablet 0  . ondansetron (ZOFRAN ODT) 4 MG disintegrating tablet Take 1 tablet (4 mg total) by mouth every 8 (eight) hours as needed for nausea. 10 tablet 0  . ONETOUCH DELICA LANCETS 12A MISC USE TO HELP CHECK BLOOD SUGAR DAILY 200 each 0  . oxcarbazepine (TRILEPTAL) 600 MG tablet Take 1 tablet (600 mg total) by mouth 2 (two) times daily. 180 tablet 1  . pantoprazole (PROTONIX) 40 MG tablet Take 1 tablet (40 mg total) by mouth daily. 90 tablet 3  . pioglitazone (ACTOS) 45 MG tablet Take 1 tablet (45 mg total) by mouth daily. 90 tablet 3  . potassium chloride (K-DUR) 10 MEQ tablet Take 2 tablets (20 mEq total) by mouth daily. 180 tablet 3  . prochlorperazine (COMPAZINE) 10 MG tablet Take 1 tablet (10 mg total) by mouth every 6 (six) hours  as needed for nausea or vomiting. 30 tablet 1  . topiramate (TOPAMAX) 100 MG tablet TAKE 1 TABLET(100 MG) BY MOUTH DAILY 90 tablet 1   No current facility-administered medications on file prior to visit.    Observations/Objective: Alert, NAD, appropriate mood and affect, resps normal, cn 2-12 intact, moves all 4s, no visible rash or swelling Lab Results  Component Value Date   WBC 5.0 08/10/2018   HGB 12.6 08/10/2018   HCT 39.3 08/10/2018   PLT 204.0 08/10/2018   GLUCOSE 505 (HH) 08/10/2018   CHOL 223 (H) 08/10/2018   TRIG (H) 08/10/2018    642.0 Triglyceride is over 400; calculations on Lipids are invalid.   HDL 40.70 08/10/2018   LDLDIRECT 98.0 08/10/2018   LDLCALC 79 09/11/2012   ALT 29 08/10/2018   AST 39 (H) 08/10/2018   NA 133 (L) 08/10/2018   K 4.1 08/10/2018   CL 95 (L) 08/10/2018   CREATININE 1.17 08/10/2018   BUN 23 08/10/2018   CO2 28 08/10/2018   TSH 0.57 08/10/2018   INR 1.35 12/23/2016   HGBA1C 12.1 (H) 08/13/2018   MICROALBUR 5.5 (H) 08/10/2018   Assessment and Plan: See notes  Follow Up Instructions: See notes   I discussed the assessment and treatment plan with the patient. The patient was provided an opportunity to ask questions and all were answered. The patient agreed with the plan and demonstrated an understanding of the instructions.   The patient was advised to call back or seek an in-person evaluation if the symptoms worsen or if the condition fails to improve as anticipated.   Cathlean Cower, MD

## 2019-03-08 NOTE — Assessment & Plan Note (Signed)
stable overall by history and exam, recent data reviewed with pt, and pt to continue medical treatment as before,  to f/u any worsening symptoms or concerns, for labs 

## 2019-03-08 NOTE — Telephone Encounter (Signed)
Left pt vm to call back to schedule 6 month fu with Dr. Jonny Ruiz.

## 2019-03-08 NOTE — Assessment & Plan Note (Signed)

## 2019-03-08 NOTE — Patient Instructions (Signed)
Please continue all other medications as before, and refills have been done if requested.  Please have the pharmacy call with any other refills you may need.  Please continue your efforts at being more active, low cholesterol diet, and weight control.  You are otherwise up to date with prevention measures today.  Please keep your appointments with your specialists as you may have planned  Please go to the LAB at the blood drawing area for the tests to be done at the [facility] lab  You will be contacted by phone if any changes need to be made immediately.  Otherwise, you will receive a letter about your results with an explanation, but please check with MyChart first.  Please remember to sign up for MyChart if you have not done so, as this will be important to you in the future with finding out test results, communicating by private email, and scheduling acute appointments online when needed.  Please make an Appointment to return in 6 months, or sooner if needed

## 2019-03-12 ENCOUNTER — Telehealth: Payer: Self-pay

## 2019-03-12 ENCOUNTER — Other Ambulatory Visit: Payer: Self-pay

## 2019-03-12 DIAGNOSIS — I722 Aneurysm of renal artery: Secondary | ICD-10-CM

## 2019-03-12 NOTE — Telephone Encounter (Signed)
This patient has been approved for the [employer] Enrollment patient assistant program until the end of the calendar year. Form scanned into chart

## 2019-03-13 ENCOUNTER — Other Ambulatory Visit (INDEPENDENT_AMBULATORY_CARE_PROVIDER_SITE_OTHER): Payer: HMO

## 2019-03-13 ENCOUNTER — Other Ambulatory Visit: Payer: Self-pay

## 2019-03-13 DIAGNOSIS — Z0000 Encounter for general adult medical examination without abnormal findings: Secondary | ICD-10-CM

## 2019-03-13 DIAGNOSIS — E119 Type 2 diabetes mellitus without complications: Secondary | ICD-10-CM | POA: Diagnosis not present

## 2019-03-13 LAB — URINALYSIS, ROUTINE W REFLEX MICROSCOPIC
Bilirubin Urine: NEGATIVE
Hgb urine dipstick: NEGATIVE
Ketones, ur: NEGATIVE
Leukocytes,Ua: NEGATIVE
Nitrite: NEGATIVE
RBC / HPF: NONE SEEN (ref 0–?)
Specific Gravity, Urine: 1.02 (ref 1.000–1.030)
Total Protein, Urine: NEGATIVE
Urine Glucose: NEGATIVE
Urobilinogen, UA: 0.2 (ref 0.0–1.0)
pH: 6 (ref 5.0–8.0)

## 2019-03-13 LAB — CBC WITH DIFFERENTIAL/PLATELET
Basophils Absolute: 0.1 10*3/uL (ref 0.0–0.1)
Basophils Relative: 1 % (ref 0.0–3.0)
Eosinophils Absolute: 0.1 10*3/uL (ref 0.0–0.7)
Eosinophils Relative: 2.2 % (ref 0.0–5.0)
HCT: 37.9 % (ref 36.0–46.0)
Hemoglobin: 12.2 g/dL (ref 12.0–15.0)
Lymphocytes Relative: 28.6 % (ref 12.0–46.0)
Lymphs Abs: 1.7 10*3/uL (ref 0.7–4.0)
MCHC: 32.3 g/dL (ref 30.0–36.0)
MCV: 76.6 fl — ABNORMAL LOW (ref 78.0–100.0)
Monocytes Absolute: 0.4 10*3/uL (ref 0.1–1.0)
Monocytes Relative: 5.9 % (ref 3.0–12.0)
Neutro Abs: 3.7 10*3/uL (ref 1.4–7.7)
Neutrophils Relative %: 62.3 % (ref 43.0–77.0)
Platelets: 220 10*3/uL (ref 150.0–400.0)
RBC: 4.94 Mil/uL (ref 3.87–5.11)
RDW: 15.1 % (ref 11.5–15.5)
WBC: 6 10*3/uL (ref 4.0–10.5)

## 2019-03-13 LAB — LIPID PANEL
Cholesterol: 179 mg/dL (ref 0–200)
HDL: 52.8 mg/dL (ref >39.00)
LDL Cholesterol: 91 mg/dL (ref 0–99)
NonHDL: 126.44
Total CHOL/HDL Ratio: 3
Triglycerides: 179 mg/dL — ABNORMAL HIGH (ref 0.0–149.0)
VLDL: 35.8 mg/dL (ref 0.0–40.0)

## 2019-03-13 LAB — BASIC METABOLIC PANEL
BUN: 23 mg/dL (ref 6–23)
CO2: 28 mEq/L (ref 19–32)
Calcium: 9.6 mg/dL (ref 8.4–10.5)
Chloride: 105 mEq/L (ref 96–112)
Creatinine, Ser: 1.18 mg/dL (ref 0.40–1.20)
GFR: 55.18 mL/min — ABNORMAL LOW (ref >60.00)
Glucose, Bld: 79 mg/dL (ref 70–99)
Potassium: 4.1 mEq/L (ref 3.5–5.1)
Sodium: 140 mEq/L (ref 135–145)

## 2019-03-13 LAB — HEPATIC FUNCTION PANEL
ALT: 13 U/L (ref 0–35)
AST: 17 U/L (ref 0–37)
Albumin: 4.3 g/dL (ref 3.5–5.2)
Alkaline Phosphatase: 83 U/L (ref 39–117)
Bilirubin, Direct: 0.1 mg/dL (ref 0.0–0.3)
Total Bilirubin: 0.3 mg/dL (ref 0.2–1.2)
Total Protein: 7.4 g/dL (ref 6.0–8.3)

## 2019-03-13 LAB — MICROALBUMIN / CREATININE URINE RATIO
Creatinine,U: 76.5 mg/dL
Microalb Creat Ratio: 8.7 mg/g (ref 0.0–30.0)
Microalb, Ur: 6.6 mg/dL — ABNORMAL HIGH (ref 0.0–1.9)

## 2019-03-13 LAB — TSH: TSH: 1.1 u[IU]/mL (ref 0.35–4.50)

## 2019-03-13 LAB — HEMOGLOBIN A1C: Hgb A1c MFr Bld: 8.2 % — ABNORMAL HIGH (ref 4.6–6.5)

## 2019-03-13 NOTE — Patient Outreach (Signed)
  [facility]) Care Management Chronic Special Needs Program    03/13/2019  Name: Stacey [name], DOB: 10/18/1950  MRN: [id number]   Stacey [name] is enrolled in a chronic special needs plan for Diabetes. RNCM called to complete health risk assessment and update individualized care plan. Client reports this is not a good time to talk and request to reschedule call.  Plan: RNCM will follow up call tomorrow weather conditions permitting.  Kathyrn Sheriff, RN, MSN, [facility] Chronic Care Management Coordinator [facility] [phone]

## 2019-03-14 ENCOUNTER — Encounter: Payer: Self-pay | Admitting: Internal Medicine

## 2019-03-14 ENCOUNTER — Other Ambulatory Visit: Payer: Self-pay | Admitting: Internal Medicine

## 2019-03-14 ENCOUNTER — Other Ambulatory Visit: Payer: Self-pay

## 2019-03-14 MED ORDER — TRULICITY 1.5 MG/0.5ML ~~LOC~~ SOAJ: 1.5000 mg | SUBCUTANEOUS | 3 refills | Status: DC

## 2019-03-14 NOTE — Telephone Encounter (Signed)
Please refill as per office routine med refill policy (all routine meds refilled for 3 mo or monthly per pt preference up to one year from last visit, then month to month grace period for 3 mo, then further med refills will have to be denied)  

## 2019-03-14 NOTE — Patient Outreach (Signed)
  [facility]) Care Management Chronic Special Needs Program    03/14/2019  Name: Stacey [name], DOB: 11/29/1950  MRN: [id number]   Ms. Laverle Crawford-Fewell is enrolled in a chronic special needs plan for Diabetes. RNCM called to follow up, complete health risk assessment and update care plan. No answer. Unable to leave message.  Plan. RNCM will outreach to client within the next 3-4 weeks.  Kathyrn Sheriff, RN, MSN, [facility] Chronic Care Management Coordinator [facility] [phone]

## 2019-03-22 ENCOUNTER — Other Ambulatory Visit: Payer: Self-pay

## 2019-03-22 ENCOUNTER — Ambulatory Visit
Admission: RE | Admit: 2019-03-22 | Discharge: 2019-03-22 | Disposition: A | Discharge status: Home / Self Care | Payer: HMO | Source: Ambulatory Visit | Attending: Surgery | Admitting: Surgery

## 2019-03-22 DIAGNOSIS — I722 Aneurysm of renal artery: Secondary | ICD-10-CM

## 2019-03-22 MED ORDER — IOPAMIDOL (ISOVUE-370) INJECTION 76%
75.0000 mL | Freq: Once | INTRAVENOUS | Status: AC | PRN
  Administered 2019-03-22: 75 mL via INTRAVENOUS

## 2019-03-25 ENCOUNTER — Encounter: Payer: HMO | Admitting: Surgery

## 2019-04-08 ENCOUNTER — Other Ambulatory Visit: Payer: Self-pay

## 2019-04-08 ENCOUNTER — Ambulatory Visit (INDEPENDENT_AMBULATORY_CARE_PROVIDER_SITE_OTHER): Payer: HMO | Admitting: *Deleted

## 2019-04-08 DIAGNOSIS — Z959 Presence of cardiac and vascular implant and graft, unspecified: Secondary | ICD-10-CM | POA: Diagnosis not present

## 2019-04-08 LAB — CUP PACEART REMOTE DEVICE CHECK
Battery Remaining Longevity: 61 mo
Battery Voltage: 3 V
Brady Statistic AP VP Percent: 11.99 %
Brady Statistic AP VS Percent: 0 %
Brady Statistic AS VP Percent: 87.93 %
Brady Statistic AS VS Percent: 0.08 %
Brady Statistic RA Percent Paced: 11.99 %
Brady Statistic RV Percent Paced: 99.9 %
Date Time Interrogation Session: 20210315131225
Implantable Lead Implant Date: 20160203
Implantable Lead Implant Date: 20160203
Implantable Lead Location: 753859
Implantable Lead Location: 753860
Implantable Lead Model: 5076
Implantable Lead Model: 5076
Implantable Pulse Generator Implant Date: 20160203
Lead Channel Impedance Value: 323 Ohm
Lead Channel Impedance Value: 418 Ohm
Lead Channel Impedance Value: 475 Ohm
Lead Channel Impedance Value: 494 Ohm
Lead Channel Pacing Threshold Amplitude: 0.625 V
Lead Channel Pacing Threshold Amplitude: 1.25 V
Lead Channel Pacing Threshold Pulse Width: 0.4 ms
Lead Channel Pacing Threshold Pulse Width: 0.4 ms
Lead Channel Sensing Intrinsic Amplitude: 2.75 mV
Lead Channel Sensing Intrinsic Amplitude: 2.75 mV
Lead Channel Sensing Intrinsic Amplitude: 5.625 mV
Lead Channel Sensing Intrinsic Amplitude: 5.625 mV
Lead Channel Setting Pacing Amplitude: 2.5 V
Lead Channel Setting Pacing Amplitude: 2.5 V
Lead Channel Setting Pacing Pulse Width: 0.4 ms
Lead Channel Setting Sensing Sensitivity: 2 mV

## 2019-04-08 NOTE — Patient Outreach (Signed)
[facility]) Care Management Chronic Special Needs Program  04/08/2019  Name: Stacey [name] DOB: 03/11/1950  MRN: [id number]  Stacey [name] is enrolled in a chronic special needs plan for Diabetes. Chronic Care Management Coordinator telephoned client to review health risk assessment and to develop individualized care plan.  Introduced the chronic care management program, importance of client participation, and taking their care plan to all provider appointments and inpatient facilities.    Subjective: client reprots she is doing better with managing diabetes and other heatlh conditions. She reports her daughter and granddaughter have moved in with her which she is happy about. She reports she is taking steps to improve her health and has scheduled her first covid vaccine. She denies any questions or concerns at this time.  Goals Addressed            This Visit's Progress   . Client verbalize knowledge of Heart Failure disease self management skills within 6-9 months.   On track    Please take your medications as prescribed. Please follow up with your provider as scheduled.  Know signs and symptoms of congestive heart failure exacerbation. Know when to call the doctor to who to call. Verbalize how fluid intake can affect congestive heart failure. Eat healthy and monitor salt intake. Visit your primary care or cardiologist as scheduled. Verbalize how daily weights can help with management of heart failure.    . Client verbalizes knowledge of Heart Attack self management skills within 3 months.   On track    Take your medications as prescribed. Please attend provider visits as scheduled. Mailed education: "Lowering your risk of heart disease". Please review and call if you have any questions.    . Client will report no worsening of symptoms of Atrial Fibrillation within the next 6 months   On track    Please take your medications as  prescribed. Please follow up with provider visits as scheduled. Mailed education: "heart failure and atrial fibrillation". Please review and call if you have any questions.    . Client will report no worsening of symptoms related to heart disease within the next 6 months.   On track    Please attend provider visits as scheduled. Please take your medications as prescribed. Mailed education: "heart healthy diet". Please read and call if you have any questions.     . Client will verbalize knowledge of chronic lung disease as evidenced by no ED visits or Inpatient stays related to chronic lung disease    On track    It is important to attend provider visits as scheduled. Please take your medications as scheduled. Mailed education: "COPD: what you can do". Please review and call if you have any questions.     . Client will verbalize knowledge of self management of Hypertension as evidences by BP reading of 140/90 or less; or as defined by provider   On track    Follow up with your doctor as scheduled. Take your medications as prescribed by your doctor. Ask your doctor "what is my target blood pressure range". Monitor your blood pressure and take results to your doctor's appointment.  Monitor the amount of salt you are eating. Continue to exercise as tolerated and remain active. Eat heart healthy diet (full of fruits, vegetables, whole grains, lean protein, water--limit salt, fat, and sugar/simple carb intake).    Marland Kitchen HEMOGLOBIN A1C < 7 (pt-stated)       Last A1C on 03/13/2019 8.2% much improved. Keep  up the good work.  Diabetes self management actions:  Glucose monitoring per provider recommendations  Eat Healthy  Check feet daily  Visit provider every 3-6 months as directed  Hbg A1C level every 3-6 months.  Eye Exam yearly  Ask your doctor, "what is my Target A1C goal?"  Ask your doctor, "what is my Target blood sugar range?"    . Obtain annual  Lipid Profile, LDL-C   On track      It is important to follow up with your provider as scheduled for recommended labs.    . Obtain Annual Eye (retinal)  Exam    Not on track    It is important to see your provider as scheduled for recommended exam. Please call and schedule your yearly eye exam.    . Obtain Annual Foot Exam   Not on track    It is important to see your provider as scheduled for recommended exam.    . Obtain Hemoglobin A1C at least 2 times per year   On track    Done on 08/13/2018 A1C 12.1%; done 01/11/2018 10.1%  Last A1C 03/13/2019 8.2% It is important to see your provider as scheduled for recommended labs.    . Patient Stated: (pt-stated)   Not on track    Take travel day trips for my enjoyment, start to go to [employer]. Take a train trip in the future and go to my family reunion in September. Enjoy my family and grand-children.   Client would like to renew this as a goal (2021)-Pandemic affected this goal. Receive your Covid-19 vaccine as you have scheduled. Continue your Covid-19 precautions: wear your mask; wait at least 6 feet distance from others; wash your hands continues to be important.    . Visit Primary Care Provider or Endocrinologist at least 2 times per year    On track    It is important to attend your provider visits as scheduled for recommended labs/procedures. If you have not had an annual wellness visit this year or have one scheduled, please call your primary care to schedule.      Discussed Covid 19 precautions, reinforced the availability of the health care concierge for benefits questions., RNCM encouraged client to call RNCM as needed for community resources or questions.   Plan: RNCM will send updated care plan to client; send updated care plan to primary care provider; send educational material. RNCM will outreach within the next 6 months.   Thea Silversmith, RN, MSN, [facility] [phone]

## 2019-04-09 NOTE — Progress Notes (Signed)
PPM Remote  

## 2019-04-25 ENCOUNTER — Telehealth (HOSPITAL_COMMUNITY): Payer: Self-pay

## 2019-04-25 NOTE — Telephone Encounter (Signed)

## 2019-04-29 ENCOUNTER — Encounter: Payer: Self-pay | Admitting: Surgery

## 2019-04-29 ENCOUNTER — Ambulatory Visit: Payer: HMO | Attending: Internal Medicine

## 2019-04-29 ENCOUNTER — Ambulatory Visit (INDEPENDENT_AMBULATORY_CARE_PROVIDER_SITE_OTHER): Payer: HMO | Admitting: Surgery

## 2019-04-29 ENCOUNTER — Other Ambulatory Visit: Payer: Self-pay

## 2019-04-29 VITALS — BP 139/68 | HR 71 | Temp 100.0°F | Resp 16 | Ht 69.0 in | Wt 197.0 lb

## 2019-04-29 DIAGNOSIS — I722 Aneurysm of renal artery: Secondary | ICD-10-CM | POA: Diagnosis not present

## 2019-04-29 DIAGNOSIS — Z23 Encounter for immunization: Secondary | ICD-10-CM

## 2019-04-29 NOTE — Progress Notes (Signed)
Vascular and Vein Specialist of [facility]  Patient name: Stacey [name] MRN: [id number] DOB: [birth year] Sex: female   REASON FOR VISIT:    Follow up  HISOTRY OF PRESENT ILLNESS:    Stacey [name] is a 69 y.o. 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  At that time it measured 9 mm.  It was located near the renal hilum.  Most recent study several years ago revealed that had changed to 1.2 cm.  She does have some left leg swelling this morning which she will get on occasion.  This resolves with compression stockings.  She continues to take a statin.  She denies any abdominal pain.  She is medically managed for hypertension.  PAST MEDICAL HISTORY:   Past Medical History:  Diagnosis Date  . ANGIOEDEMA 03/07/2008   a. with ACE-I  . ANXIETY 09/16/2006  . ASTHMA 08/01/2006  . CAD (coronary artery disease)    a. 01/2010 : Minimal plaque at cardiac catheterization - CFX 20%, EF 70%;  b. 08/2012 Cath: Nl Cors, EF 65%.  . Chronic diastolic CHF (congestive heart failure) ([place]) 04/06/2010   a. In setting of HOCM.  Marland Kitchen Complete heart block ([place]) 02/26/2014   a. s/p MDT dual chamber pacemaker 02/2014  . COPD (chronic obstructive pulmonary disease) ([place])   . DEPRESSION 09/16/2006  . DIVERTICULOSIS, COLON, WITH HEMORRHAGE 04/06/2010  . DM (diabetes mellitus) in pregnancy, delivered w/postpartum condition 08/30/2010  . HYPERLIPIDEMIA 08/01/2006  . HYPERTENSION 08/01/2006  . 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 s/p septal myomectomy 4/12 at [facility] with Dr. Evelina Dun;  b. echo 5/12: EF 60-65%, LVOT peak 18 mmHg; grade 1 diast dysfxn, mild SAM (improved since myomectomy;  c. 03/2012 Echo: EF 60-65%, mod-sev basal septal asymm hypertrophy, basal septal HK, LVOT grad 40mHg, Gr 1 DD, , SAM, Mild MR, nl RV, PASP 340mg; 08/2012 Echo: technically difficult, doubt LVOT obstruction, EF 60%,  Gr 1 DD, mod-sev dil LA.  . Impaired glucose tolerance 06/25/2010  . LBBB (left bundle branch block) 06/17/2010  . OBESITY 09/16/2006  . Renal artery aneurysm ([place]  . SICKLE CELL ANEMIA 08/01/2006  . Type II or unspecified type diabetes mellitus without mention of complication, uncontrolled 08/30/2010  . VENTRAL HERNIA 12/07/2006     FAMILY HISTORY:   Family History  Adopted: Yes  Problem Relation Age of Onset  . Heart disease Father   . Heart attack Father   . Hypertension Father        before age 68. Colon cancer Daughter        and bleeding disorder  . Hyperlipidemia Daughter   . Heart disease Daughter   . Cancer Daughter   . Heart attack Sister   . Hyperlipidemia Sister   . Stroke Sister   . Hypertension Sister   . Hyperlipidemia Brother   . Hypertension Brother   . Diabetes Paternal Grandmother   . Clotting disorder Daughter   . Clotting disorder Other        granddaughter  . Hypertension Son     SOCIAL HISTORY:   Social History   Tobacco Use  . Smoking status: Former Smoker    Packs/day: 0.25    Years: 20.00    Pack years: 5.00    Types: Cigarettes    Quit date: 01/24/1998    Years since quitting: 21.2  . Smokeless tobacco: Never Used  . Tobacco comment: Quit  smoking 2001. Smoked on and off for 20 years. Smoked 2-3 cigars daily  Substance Use Topics  . Alcohol use: No    Alcohol/week: 0.0 standard drinks     ALLERGIES:   Allergies  Allergen Reactions  . Dairy Aid [Lactase] Diarrhea and Other (See Comments)    Bloating and gastric distress  . Metformin And Related Other (See Comments)    GI upset  . Ace Inhibitors Other (See Comments)    REACTION: angioedema right eye  . Aspirin Other (See Comments)    Bleeding   . Codeine Other (See Comments)    Hallucinations, can take if with someone.  . Soy Allergy Other (See Comments)    Bleeding & cramps, diarrhea      CURRENT MEDICATIONS:   Current Outpatient Medications  Medication Sig Dispense  Refill  . acetaminophen (TYLENOL) 500 MG tablet Take 500 mg by mouth daily as needed.    Marland Kitchen albuterol (VENTOLIN HFA) 108 (90 Base) MCG/ACT inhaler Inhale 2 puffs into the lungs every 6 (six) hours as needed for wheezing or shortness of breath. 18 g 5  . amLODipine (NORVASC) 10 MG tablet Take 1 tablet (10 mg total) by mouth daily. 90 tablet 3  . atorvastatin (LIPITOR) 40 MG tablet TAKE 1 TABLET(40 MG) BY MOUTH DAILY 90 tablet 3  . Blood Glucose Monitoring Suppl (ONE TOUCH ULTRA 2) W/DEVICE KIT Use to check blood sugars daily Dx E11.9 1 each 0  . cetirizine (ZYRTEC) 10 MG tablet Take 10 mg by mouth daily.      . clonazePAM (KLONOPIN) 0.5 MG tablet TAKE 1 TABLET BY MOUTH TWICE DAILY AS NEEDED FOR ANXIETY 30 tablet 2  . Dulaglutide (TRULICITY) 1.5 0000000 SOPN Inject 1.5 mg into the skin once a week. 6 mL 3  . fenofibrate (TRICOR) 145 MG tablet Take 1 tablet (145 mg total) by mouth daily. 90 tablet 3  . glipiZIDE (GLUCOTROL XL) 10 MG 24 hr tablet TAKE 1 TABLET BY MOUTH EVERY DAY WITH BREAKFAST 90 tablet 0  . glucose blood test strip 1 each by Other route daily. Use to check blood sugar daily Dx e11.9 100 each 11  . hydrochlorothiazide (MICROZIDE) 12.5 MG capsule TAKE 1 CAPSULE BY MOUTH DAILY 90 capsule 1  . hydrocortisone (ANUSOL-HC) 2.5 % rectal cream Place 1 application rectally as needed for hemorrhoids or anal itching.    . latanoprost (XALATAN) 0.005 % ophthalmic solution Place 1 drop into both eyes at bedtime.  3  . losartan (COZAAR) 100 MG tablet TAKE 1 TABLET BY MOUTH DAILY 90 tablet 1  . meclizine (ANTIVERT) 12.5 MG tablet Take 1 tablet (12.5 mg total) by mouth 3 (three) times daily as needed for dizziness. 40 tablet 2  . metoprolol succinate (TOPROL-XL) 100 MG 24 hr tablet Take 1 tablet (100 mg total) by mouth daily. Take with or immediately following a meal. 90 tablet 0  . ondansetron (ZOFRAN ODT) 4 MG disintegrating tablet Take 1 tablet (4 mg total) by mouth every 8 (eight) hours as  needed for nausea. 10 tablet 0  . ONETOUCH DELICA LANCETS 99991111 MISC USE TO HELP CHECK BLOOD SUGAR DAILY 200 each 0  . oxcarbazepine (TRILEPTAL) 600 MG tablet Take 1 tablet (600 mg total) by mouth 2 (two) times daily. 180 tablet 1  . pantoprazole (PROTONIX) 40 MG tablet TAKE 1 TABLET BY MOUTH EVERY DAY 90 tablet 3  . pioglitazone (ACTOS) 45 MG tablet Take 1 tablet (45 mg total) by mouth daily. 90 tablet 3  .  potassium chloride (K-DUR) 10 MEQ tablet Take 2 tablets (20 mEq total) by mouth daily. 180 tablet 3  . prochlorperazine (COMPAZINE) 10 MG tablet Take 1 tablet (10 mg total) by mouth every 6 (six) hours as needed for nausea or vomiting. 30 tablet 1  . topiramate (TOPAMAX) 100 MG tablet TAKE 1 TABLET(100 MG) BY MOUTH DAILY 90 tablet 1   No current facility-administered medications for this visit.    REVIEW OF SYSTEMS:   '[X]'$  denotes positive finding, '[ ]'$  denotes negative finding Cardiac  Comments:  Chest pain or chest pressure:    Shortness of breath upon exertion:    Short of breath when lying flat:    Irregular heart rhythm:        Vascular    Pain in calf, thigh, or hip brought on by ambulation:    Pain in feet at night that wakes you up from your sleep:     Blood clot in your veins:    Leg swelling:         Pulmonary    Oxygen at home:    Productive cough:     Wheezing:         Neurologic    Sudden weakness in arms or legs:     Sudden numbness in arms or legs:     Sudden onset of difficulty speaking or slurred speech:    Temporary loss of vision in one eye:     Problems with dizziness:         Gastrointestinal    Blood in stool:     Vomited blood:         Genitourinary    Burning when urinating:     Blood in urine:        Psychiatric    Major depression:         Hematologic    Bleeding problems:    Problems with blood clotting too easily:        Skin    Rashes or ulcers:        Constitutional    Fever or chills:      PHYSICAL EXAM:   Vitals:    04/29/19 0940  BP: 139/68  Pulse: 71  Resp: 16  Temp: 100 F (37.8 C)  TempSrc: Temporal  SpO2: 98%  Weight: 197 lb (89.4 kg)  Height: '5\' 9"'$  (1.753 m)    GENERAL: The patient is a well-nourished female, in no acute distress. The vital signs are documented above. CARDIAC: There is a regular rate and rhythm.  VASCULAR: Nonpalpable pedal pulses.  Left leg pitting edema PULMONARY: Non-labored respirations ABDOMEN: Soft and non-tender  MUSCULOSKELETAL: There are no major deformities or cyanosis. NEUROLOGIC: No focal weakness or paresthesias are detected. SKIN: There are no ulcers or rashes noted. PSYCHIATRIC: The patient has a normal affect.  STUDIES:   I have reviewed the following CTA: 1. 1.3 cm distal right renal artery saccular aneurysm, previously 1.2 cm. 2. Coronary and aortic Atherosclerosis (ICD10-170.0). 3. 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 4. Colonic diverticulosis.  MEDICAL ISSUES:   Right renal artery aneurysm: Minimal change.  Maximum diameter is 1.3 cm.  She will follow-up in 2 years with a MRA  Spigelian hernia: She states that she has seen a specialist and nonsurgical treatment is recommended.    Leia Alf, MD, FACS Vascular and Vein Specialists of [facility] [phone] Pager [phone]

## 2019-04-29 NOTE — Progress Notes (Signed)
   Covid-19 Vaccination Clinic  Name:  JAYLANNI ELTRINGHAM    MRN: [id number] DOB: November 14, 1950  04/29/2019  Ms. Crawford-Fewell was observed post Covid-19 immunization for 15 minutes without incident. She was provided with Vaccine Information Sheet and instruction to access the V-Safe system.   Ms. Fister was instructed to call 911 with any severe reactions post vaccine: Marland Kitchen Difficulty breathing  . Swelling of face and throat  . A fast heartbeat  . A bad rash all over body  . Dizziness and weakness   Immunizations Administered    Name Date Dose VIS Date Route   Pfizer COVID-19 Vaccine 04/29/2019 12:46 PM 0.3 mL 01/04/2019 Intramuscular   Manufacturer: [employer], [employer]   Lot: [id number]   NDC: [address]-3

## 2019-05-01 ENCOUNTER — Other Ambulatory Visit: Payer: Self-pay | Admitting: Internal Medicine

## 2019-05-01 NOTE — Telephone Encounter (Signed)
Please refill as per office routine med refill policy (all routine meds refilled for 3 mo or monthly per pt preference up to one year from last visit, then month to month grace period for 3 mo, then further med refills will have to be denied)  

## 2019-05-20 ENCOUNTER — Ambulatory Visit: Payer: HMO | Attending: Internal Medicine

## 2019-05-20 DIAGNOSIS — Z23 Encounter for immunization: Secondary | ICD-10-CM

## 2019-05-20 NOTE — Progress Notes (Signed)
   Covid-19 Vaccination Clinic  Name:  Stacey [name]    MRN: [id number] DOB: 1950/03/08  05/20/2019  Stacey [name] was observed post Covid-19 immunization for 15 minutes without incident. She was provided with Vaccine Information Sheet and instruction to access the V-Safe system.   Stacey [name] was instructed to call 911 with any severe reactions post vaccine: Marland Kitchen Difficulty breathing  . Swelling of face and throat  . A fast heartbeat  . A bad rash all over body  . Dizziness and weakness   Immunizations Administered    Name Date Dose VIS Date Route   Pfizer COVID-19 Vaccine 05/20/2019  3:03 PM 0.3 mL 03/20/2018 Intramuscular   Manufacturer: [employer], [employer]   Lot: [id number]   NDC: [address]-8

## 2019-06-11 ENCOUNTER — Other Ambulatory Visit: Payer: Self-pay | Admitting: Internal Medicine

## 2019-06-11 NOTE — Telephone Encounter (Signed)
Please refill as per office routine med refill policy (all routine meds refilled for 3 mo or monthly per pt preference up to one year from last visit, then month to month grace period for 3 mo, then further med refills will have to be denied)  

## 2019-07-01 LAB — HM DIABETES EYE EXAM

## 2019-07-08 ENCOUNTER — Ambulatory Visit (INDEPENDENT_AMBULATORY_CARE_PROVIDER_SITE_OTHER): Payer: HMO | Admitting: *Deleted

## 2019-07-08 DIAGNOSIS — I442 Atrioventricular block, complete: Secondary | ICD-10-CM | POA: Diagnosis not present

## 2019-07-08 LAB — CUP PACEART REMOTE DEVICE CHECK
Battery Remaining Longevity: 61 mo
Battery Voltage: 2.99 V
Brady Statistic AP VP Percent: 13.93 %
Brady Statistic AP VS Percent: 0 %
Brady Statistic AS VP Percent: 85.91 %
Brady Statistic AS VS Percent: 0.16 %
Brady Statistic RA Percent Paced: 13.92 %
Brady Statistic RV Percent Paced: 99.78 %
Date Time Interrogation Session: 20210614101104
Implantable Lead Implant Date: 20160203
Implantable Lead Implant Date: 20160203
Implantable Lead Location: 753859
Implantable Lead Location: 753860
Implantable Lead Model: 5076
Implantable Lead Model: 5076
Implantable Pulse Generator Implant Date: 20160203
Lead Channel Impedance Value: 304 Ohm
Lead Channel Impedance Value: 399 Ohm
Lead Channel Impedance Value: 456 Ohm
Lead Channel Impedance Value: 456 Ohm
Lead Channel Pacing Threshold Amplitude: 0.625 V
Lead Channel Pacing Threshold Amplitude: 1.25 V
Lead Channel Pacing Threshold Pulse Width: 0.4 ms
Lead Channel Pacing Threshold Pulse Width: 0.4 ms
Lead Channel Sensing Intrinsic Amplitude: 2.125 mV
Lead Channel Sensing Intrinsic Amplitude: 2.125 mV
Lead Channel Sensing Intrinsic Amplitude: 6.5 mV
Lead Channel Sensing Intrinsic Amplitude: 6.5 mV
Lead Channel Setting Pacing Amplitude: 2.5 V
Lead Channel Setting Pacing Amplitude: 2.5 V
Lead Channel Setting Pacing Pulse Width: 0.4 ms
Lead Channel Setting Sensing Sensitivity: 2 mV

## 2019-07-09 NOTE — Progress Notes (Signed)
Remote pacemaker transmission.   

## 2019-07-17 ENCOUNTER — Other Ambulatory Visit: Payer: Self-pay | Admitting: Internal Medicine

## 2019-07-17 NOTE — Telephone Encounter (Signed)
Please refill as per office routine med refill policy (all routine meds refilled for 3 mo or monthly per pt preference up to one year from last visit, then month to month grace period for 3 mo, then further med refills will have to be denied)  

## 2019-07-27 ENCOUNTER — Other Ambulatory Visit: Payer: Self-pay | Admitting: Internal Medicine

## 2019-07-27 NOTE — Telephone Encounter (Signed)
Please refill as per office routine med refill policy (all routine meds refilled for 3 mo or monthly per pt preference up to one year from last visit, then month to month grace period for 3 mo, then further med refills will have to be denied)  

## 2019-08-17 ENCOUNTER — Other Ambulatory Visit: Payer: Self-pay | Admitting: Internal Medicine

## 2019-08-17 NOTE — Telephone Encounter (Signed)
Please refill as per office routine med refill policy (all routine meds refilled for 3 mo or monthly per pt preference up to one year from last visit, then month to month grace period for 3 mo, then further med refills will have to be denied)  

## 2019-08-24 ENCOUNTER — Other Ambulatory Visit: Payer: Self-pay | Admitting: Internal Medicine

## 2019-08-24 NOTE — Telephone Encounter (Signed)
Please refill as per office routine med refill policy (all routine meds refilled for 3 mo or monthly per pt preference up to one year from last visit, then month to month grace period for 3 mo, then further med refills will have to be denied)  

## 2019-08-25 ENCOUNTER — Other Ambulatory Visit: Payer: Self-pay | Admitting: Internal Medicine

## 2019-08-25 NOTE — Telephone Encounter (Signed)
Please refill as per office routine med refill policy (all routine meds refilled for 3 mo or monthly per pt preference up to one year from last visit, then month to month grace period for 3 mo, then further med refills will have to be denied)  

## 2019-09-06 DIAGNOSIS — H26493 Other secondary cataract, bilateral: Secondary | ICD-10-CM | POA: Diagnosis not present

## 2019-09-06 DIAGNOSIS — H26492 Other secondary cataract, left eye: Secondary | ICD-10-CM | POA: Diagnosis not present

## 2019-09-06 DIAGNOSIS — H02831 Dermatochalasis of right upper eyelid: Secondary | ICD-10-CM | POA: Diagnosis not present

## 2019-09-06 DIAGNOSIS — H18413 Arcus senilis, bilateral: Secondary | ICD-10-CM | POA: Diagnosis not present

## 2019-09-06 DIAGNOSIS — Z961 Presence of intraocular lens: Secondary | ICD-10-CM | POA: Diagnosis not present

## 2019-09-09 ENCOUNTER — Telehealth: Payer: Self-pay | Admitting: Internal Medicine

## 2019-09-09 NOTE — Telephone Encounter (Signed)
Patient wants to know if she should get the booster covid vaccine, patient received the pfizer vaccine and wants to know about the booster.

## 2019-09-09 NOTE — Telephone Encounter (Signed)
Sent to Dr. John. 

## 2019-09-09 NOTE — Telephone Encounter (Signed)
We can hold off for right now, as pt is not the kind of immunocompromised pt they are not referring to, such as those patients who have had an organ transplant in past

## 2019-09-10 NOTE — Telephone Encounter (Signed)
New message:   Pt is calling back to see what Dr. Jonny Ruiz said in reference to her getting the booster. Pt states she hasn't received a call back from the other day. Please advise.

## 2019-09-10 NOTE — Telephone Encounter (Signed)
Called pt there was no answer LMOM w/MD response../lmb 

## 2019-09-18 ENCOUNTER — Telehealth: Payer: Self-pay

## 2019-09-18 NOTE — Telephone Encounter (Signed)
New message    The patient right knee pop forwarded her daughter advised her to call the PCP.  The patient voiced that this has been happening to her since the age of 58.   I asked the patient will she will be going to urgent care and the ED patient refused wanted to let the MD be aware

## 2019-09-18 NOTE — Telephone Encounter (Signed)
error 

## 2019-09-20 NOTE — Telephone Encounter (Signed)
Sent to Dr. John. 

## 2019-09-20 NOTE — Telephone Encounter (Signed)
Ok, but if having worsening pain or swelling or knee feels unstable, she can make appt with sport medicine as they are very good at finding source of problem

## 2019-09-25 NOTE — Telephone Encounter (Signed)
LDVM for pt of Dr. Raphael Gibney note and instructions.

## 2019-10-01 DIAGNOSIS — H26491 Other secondary cataract, right eye: Secondary | ICD-10-CM | POA: Diagnosis not present

## 2019-10-03 ENCOUNTER — Other Ambulatory Visit: Payer: Self-pay | Admitting: Internal Medicine

## 2019-10-03 NOTE — Telephone Encounter (Signed)
Please refill as per office routine med refill policy (all routine meds refilled for 3 mo or monthly per pt preference up to one year from last visit, then month to month grace period for 3 mo, then further med refills will have to be denied)  

## 2019-10-04 ENCOUNTER — Other Ambulatory Visit: Payer: Self-pay

## 2019-10-04 NOTE — Patient Outreach (Addendum)
[facility]) Care Management Chronic Special Needs Program  10/04/2019  Name: Stacey [name] DOB: 1950-03-24  MRN: [id number]  [name] is enrolled in a chronic special needs plan for Diabetes. RNCM called to follow up and update care plan.  Subjective: client reports she is doing well. Denies any issues or concerns. Reports she has changed diet and primarily eats Vegan. Last A1C 8.2 in February and reports her blood sugars have primarily been in the range of 85-120. She states her Blood sugar this morning was 132. Client reports she has an upcoming appointment in October with primary care. Client denies any signs/symptoms of heart failure exacerbation. She denies any issues or concerns at this time.  Goals Addressed            This Visit's Progress   . COMPLETED: Client verbalize knowledge of Heart Failure disease self management skills within 6-9 months.       Client takes medications as prescribed and attends provider visits as scheduled. Denies any swelling, signs or symptoms of exacerbation.  Know signs and symptoms of congestive heart failure exacerbation. Know when to call the doctor to who to call. Verbalize how fluid intake can affect congestive heart failure. Eat healthy and monitor salt intake. Visit your primary care or cardiologist as scheduled. Verbalize how daily weights can help with management of heart failure.    . COMPLETED: Client verbalizes knowledge of Heart Attack self management skills within 3 months.       Medications reviewed. Client reports attends cardiologist visits as scheduled and is in communication with cardiologist.     . COMPLETED: Client will report no worsening of symptoms of Atrial Fibrillation within the next 6 months       Denies any issues at this time. Reports no worsening of signs or symptoms of atrial fibrillation.    . COMPLETED: Client will report no worsening of symptoms related to  heart disease within the next 6 months.       Reviewed upcoming cardiology appointment. Medications reviewed. Reports no worsening of symptoms.     . COMPLETED: Client will verbalize knowledge of chronic lung disease as evidenced by no ED visits or Inpatient stays related to chronic lung disease        Client reports no issues or concerns. No ED visits or inpatient stays related to COPD. Medications reviewed.    . Client will verbalize knowledge of self management of Hypertension as evidences by BP reading of 140/90 or less; or as defined by provider   On track    Per chart, Last documented blood pressure 139/68. Discussed importance of monitoring blood pressure. Continue to follow up with your doctor as scheduled. Take your medications as prescribed by your doctor. Ask your doctor "what is my target blood pressure range". Monitor the amount of salt you are eating. Continue to exercise as tolerated and remain active. Eat heart healthy diet (full of fruits, vegetables, whole grains, lean protein, water--limit salt, fat, and sugar/simple carb intake). Discussed low salt diet, client reports she is currently eating vegan.    . COMPLETED: Obtain annual  Lipid Profile, LDL-C       Completed 03/13/19    . COMPLETED: Obtain Annual Eye (retinal)  Exam        Completed 07/01/19    . Obtain Annual Foot Exam   On track    Client reports primarily having virtual visits so far this year.  Please continue to inspect feet daily. Emmi  education: "foot care for diabetics. Review and call if you have any questions.    . Obtain Hemoglobin A1C at least 2 times per year   On track    Completed 03/13/19 A1C 8.2. Client reports upcoming appointment October 2021. on 08/13/2018 A1C 12.1%    . Visit Primary Care Provider or Endocrinologist at least 2 times per year    On track    It is important to attend your provider visits as scheduled for recommended labs/procedures. If you have not had an annual wellness  visit this year or have one scheduled, please call your primary care to schedule.  Client reports provider visits Virtual 03/08/19 and upcoming visit in October.      Warm transfer completed to health care concerge for information regarding over the counter benefit.  Plan: send updated care plan to client; send updated care plan to primary care provider. Send Secretary/administrator. Next outreach per tier level within the next 6 months.    Kathyrn Sheriff, RN, MSN, [facility] Chronic Care Management Coordinator [facility] [phone]

## 2019-10-07 ENCOUNTER — Ambulatory Visit (INDEPENDENT_AMBULATORY_CARE_PROVIDER_SITE_OTHER): Payer: HMO | Admitting: *Deleted

## 2019-10-07 DIAGNOSIS — I442 Atrioventricular block, complete: Secondary | ICD-10-CM | POA: Diagnosis not present

## 2019-10-07 DIAGNOSIS — I495 Sick sinus syndrome: Secondary | ICD-10-CM

## 2019-10-07 LAB — CUP PACEART REMOTE DEVICE CHECK
Battery Remaining Longevity: 54 mo
Battery Voltage: 2.99 V
Brady Statistic AP VP Percent: 15.63 %
Brady Statistic AP VS Percent: 0 %
Brady Statistic AS VP Percent: 84.05 %
Brady Statistic AS VS Percent: 0.32 %
Brady Statistic RA Percent Paced: 15.58 %
Brady Statistic RV Percent Paced: 99.37 %
Date Time Interrogation Session: 20210913085128
Implantable Lead Implant Date: 20160203
Implantable Lead Implant Date: 20160203
Implantable Lead Location: 753859
Implantable Lead Location: 753860
Implantable Lead Model: 5076
Implantable Lead Model: 5076
Implantable Pulse Generator Implant Date: 20160203
Lead Channel Impedance Value: 323 Ohm
Lead Channel Impedance Value: 437 Ohm
Lead Channel Impedance Value: 513 Ohm
Lead Channel Impedance Value: 551 Ohm
Lead Channel Pacing Threshold Amplitude: 0.625 V
Lead Channel Pacing Threshold Amplitude: 1.25 V
Lead Channel Pacing Threshold Pulse Width: 0.4 ms
Lead Channel Pacing Threshold Pulse Width: 0.4 ms
Lead Channel Sensing Intrinsic Amplitude: 2.625 mV
Lead Channel Sensing Intrinsic Amplitude: 2.625 mV
Lead Channel Sensing Intrinsic Amplitude: 8 mV
Lead Channel Sensing Intrinsic Amplitude: 8 mV
Lead Channel Setting Pacing Amplitude: 2.5 V
Lead Channel Setting Pacing Amplitude: 2.5 V
Lead Channel Setting Pacing Pulse Width: 0.4 ms
Lead Channel Setting Sensing Sensitivity: 2 mV

## 2019-10-08 ENCOUNTER — Ambulatory Visit: Payer: Self-pay

## 2019-10-08 DIAGNOSIS — Z961 Presence of intraocular lens: Secondary | ICD-10-CM | POA: Diagnosis not present

## 2019-10-09 NOTE — Progress Notes (Signed)
Remote pacemaker transmission.   

## 2019-10-26 ENCOUNTER — Other Ambulatory Visit: Payer: Self-pay | Admitting: Internal Medicine

## 2019-10-28 ENCOUNTER — Telehealth: Payer: Self-pay | Admitting: Internal Medicine

## 2019-10-28 NOTE — Telephone Encounter (Signed)
We received a medical clearance form from [employer]. Patient was due for an appointment around 08/2019.   LVM to inform patient she needs and OV.

## 2019-10-28 NOTE — Telephone Encounter (Signed)
Patient has made an appointment for 11/05/19.

## 2019-11-05 ENCOUNTER — Encounter: Payer: Self-pay | Admitting: Internal Medicine

## 2019-11-05 ENCOUNTER — Ambulatory Visit (INDEPENDENT_AMBULATORY_CARE_PROVIDER_SITE_OTHER): Payer: HMO | Admitting: Internal Medicine

## 2019-11-05 ENCOUNTER — Other Ambulatory Visit: Payer: Self-pay

## 2019-11-05 VITALS — BP 130/80 | HR 70 | Temp 98.1°F | Ht 69.0 in | Wt 198.0 lb

## 2019-11-05 DIAGNOSIS — E785 Hyperlipidemia, unspecified: Secondary | ICD-10-CM

## 2019-11-05 DIAGNOSIS — N1831 Chronic kidney disease, stage 3a: Secondary | ICD-10-CM

## 2019-11-05 DIAGNOSIS — E119 Type 2 diabetes mellitus without complications: Secondary | ICD-10-CM

## 2019-11-05 DIAGNOSIS — I10 Essential (primary) hypertension: Secondary | ICD-10-CM | POA: Diagnosis not present

## 2019-11-05 DIAGNOSIS — Z01818 Encounter for other preprocedural examination: Secondary | ICD-10-CM

## 2019-11-05 LAB — LIPID PANEL
Cholesterol: 180 mg/dL (ref 0–200)
HDL: 59.1 mg/dL (ref >39.00)
LDL Cholesterol: 86 mg/dL (ref 0–99)
NonHDL: 120.55
Total CHOL/HDL Ratio: 3
Triglycerides: 175 mg/dL — ABNORMAL HIGH (ref 0.0–149.0)
VLDL: 35 mg/dL (ref 0.0–40.0)

## 2019-11-05 LAB — HEMOGLOBIN A1C: Hgb A1c MFr Bld: 7 % — ABNORMAL HIGH (ref 4.6–6.5)

## 2019-11-05 LAB — HEPATIC FUNCTION PANEL
ALT: 18 U/L (ref 0–35)
AST: 23 U/L (ref 0–37)
Albumin: 4.4 g/dL (ref 3.5–5.2)
Alkaline Phosphatase: 89 U/L (ref 39–117)
Bilirubin, Direct: 0.1 mg/dL (ref 0.0–0.3)
Total Bilirubin: 0.3 mg/dL (ref 0.2–1.2)
Total Protein: 7.9 g/dL (ref 6.0–8.3)

## 2019-11-05 LAB — BASIC METABOLIC PANEL
BUN: 18 mg/dL (ref 6–23)
CO2: 27 mEq/L (ref 19–32)
Calcium: 10 mg/dL (ref 8.4–10.5)
Chloride: 103 mEq/L (ref 96–112)
Creatinine, Ser: 1.06 mg/dL (ref 0.40–1.20)
GFR: 53.81 mL/min — ABNORMAL LOW (ref >60.00)
Glucose, Bld: 68 mg/dL — ABNORMAL LOW (ref 70–99)
Potassium: 4.2 mEq/L (ref 3.5–5.1)
Sodium: 138 mEq/L (ref 135–145)

## 2019-11-05 LAB — PHOSPHORUS: Phosphorus: 3.2 mg/dL (ref 2.3–4.6)

## 2019-11-05 LAB — VITAMIN D 25 HYDROXY (VIT D DEFICIENCY, FRACTURES): VITD: 23.33 ng/mL — ABNORMAL LOW (ref 30.00–100.00)

## 2019-11-05 NOTE — Patient Instructions (Addendum)
You had the flu shot today  Your form should be finished today  Please continue all other medications as before, and refills have been done if requested.  Please have the pharmacy call with any other refills you may need.  Please continue your efforts at being more active, low cholesterol diet, and weight control.  Please keep your appointments with your specialists as you may have planned  Please go to the LAB at the blood drawing area for the tests to be done  You will be contacted by phone if any changes need to be made immediately.  Otherwise, you will receive a letter about your results with an explanation, but please check with MyChart first.  Please remember to sign up for MyChart if you have not done so, as this will be important to you in the future with finding out test results, communicating by private email, and scheduling acute appointments online when needed.  Please make an Appointment to return in 6 months, or sooner if needed

## 2019-11-05 NOTE — Progress Notes (Signed)
Subjective:    Patient ID: Stacey [name], female    DOB: [birth year], 69 y.o.   MRN: [id number]  HPI Here to f/u; overall doing ok,  Pt denies chest pain, increasing sob or doe, wheezing, orthopnea, PND, increased LE swelling, palpitations, dizziness or syncope.  Pt denies new neurological symptoms such as new headache, or facial or extremity weakness or numbness.  Pt denies polydipsia, polyuria, or low sugar episode.  Pt states overall good compliance with meds, mostly trying to follow appropriate diet, with wt overall stable,  Wt Readings from Last 3 Encounters:  11/05/19 198 lb (89.8 kg)  04/29/19 197 lb (89.4 kg)  01/30/19 196 lb 3.2 oz (89 kg)   BP Readings from Last 3 Encounters:  11/05/19 130/80  04/29/19 139/68  01/30/19 (!) 142/84  Due for dental procedure, needs form filled out. No new complaints Past Medical History:  Diagnosis Date  . ANGIOEDEMA 03/07/2008   a. with ACE-I  . ANXIETY 09/16/2006  . ASTHMA 08/01/2006  . CAD (coronary artery disease)    a. 01/2010 : Minimal plaque at cardiac catheterization - CFX 20%, EF 70%;  b. 08/2012 Cath: Nl Cors, EF 65%.  . Chronic diastolic CHF (congestive heart failure) ([place]) 04/06/2010   a. In setting of HOCM.  Marland Kitchen Complete heart block ([place]) 02/26/2014   a. s/p MDT dual chamber pacemaker 02/2014  . COPD (chronic obstructive pulmonary disease) ([place])   . DEPRESSION 09/16/2006  . DIVERTICULOSIS, COLON, WITH HEMORRHAGE 04/06/2010  . DM (diabetes mellitus) in pregnancy, delivered w/postpartum condition 08/30/2010  . HYPERLIPIDEMIA 08/01/2006  . HYPERTENSION 08/01/2006  . 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 s/p septal myomectomy 4/12 at [facility] with Dr. Evelina Dun;  b. echo 5/12: EF 60-65%, LVOT peak 18 mmHg; grade 1 diast dysfxn, mild SAM (improved since myomectomy;  c. 03/2012 Echo: EF 60-65%, mod-sev basal septal asymm hypertrophy, basal septal HK, LVOT grad 45mHg, Gr 1 DD, , SAM, Mild MR, nl RV, PASP 342mg; 08/2012 Echo: technically difficult,  doubt LVOT obstruction, EF 60%, Gr 1 DD, mod-sev dil LA.  . Impaired glucose tolerance 06/25/2010  . LBBB (left bundle branch block) 06/17/2010  . OBESITY 09/16/2006  . [place]al artery [place]  . SICKLE CELL ANEMIA 08/01/2006  . Type II or unspecified type diabetes mellitus without mention of complication, uncontrolled 08/30/2010  . VENTRAL HERNIA 12/07/2006   Past Surgical History:  Procedure Laterality Date  . ABDOMINAL HYSTERECTOMY  1987  . CARDIAC SURGERY    . COLONOSCOPY N/A 12/26/2016   Procedure: COLONOSCOPY;  Surgeon: JaMilus BanisterMD;  Location: [facility];  Service: Endoscopy;  Laterality: N/A;  . ELECTROPHYSIOLOGY STUDY N/A 09/12/2012   Procedure: ELECTROPHYSIOLOGY STUDY;  Surgeon: StDeboraha SprangMD;  [facility];  Service: Cardiovascular;  Laterality: N/A;  . LEFT HEART CATH  Aug. 18, 2014   Medtronic heart device  . LEFT HEART CATHETERIZATION WITH CORONARY ANGIOGRAM N/A 09/10/2012   Procedure: LEFT HEART CATHETERIZATION WITH CORONARY ANGIOGRAM;  Surgeon: ChBurnell BlanksMD;  [facility];  Service: Cardiovascular;  Laterality: N/A;  . MYOMECTOMY     Septal  . PERMANENT PACEMAKER INSERTION N/A 02/26/2014   MDT Advisa dual chamber pacemaker implanted by Dr KlCaryl Comesor heart block  . VENTRAL HERNIA REPAIR  06/2006    reports that she quit smoking about 21 years ago. Her smoking use included cigarettes. She has a 5.00 pack-year smoking history. She has never used smokeless tobacco. She reports that  she does not drink alcohol and does not use drugs. family history includes Cancer in her daughter; Clotting disorder in her daughter and another family member; Colon cancer in her daughter; Diabetes in her paternal grandmother; Heart attack in her father and sister; Heart disease in her daughter and father; Hyperlipidemia in her brother, daughter, and sister; Hypertension in her brother, father, sister, and son; Stroke in her sister. She was adopted. Allergies    Allergen Reactions  . Dairy Aid [Lactase] Diarrhea and Other (See Comments)    Bloating and gastric distress  . Metformin And Related Other (See Comments)    GI upset  . Ace Inhibitors Other (See Comments)    REACTION: angioedema right eye  . Aspirin Other (See Comments)    Bleeding   . Codeine Other (See Comments)    Hallucinations, can take if with someone.  . Soy Allergy Other (See Comments)    Bleeding & cramps, diarrhea    Current Outpatient Medications on File Prior to Visit  Medication Sig Dispense Refill  . acetaminophen (TYLENOL) 500 MG tablet Take 500 mg by mouth daily as needed.    Marland Kitchen albuterol (VENTOLIN HFA) 108 (90 Base) MCG/ACT inhaler Inhale 2 puffs into the lungs every 6 (six) hours as needed for wheezing or shortness of breath. 18 g 5  . amLODipine (NORVASC) 10 MG tablet TAKE 1 TABLET BY MOUTH DAILY 90 tablet 3  . atorvastatin (LIPITOR) 40 MG tablet TAKE 1 TABLET BY MOUTH DAILY. 90 tablet 3  . Blood Glucose Monitoring Suppl (ONE TOUCH ULTRA 2) W/DEVICE KIT Use to check blood sugars daily Dx E11.9 1 each 0  . cetirizine (ZYRTEC) 10 MG tablet Take 10 mg by mouth daily.      . clonazePAM (KLONOPIN) 0.5 MG tablet TAKE 1 TABLET BY MOUTH TWICE DAILY AS NEEDED FOR ANXIETY 30 tablet 2  . fenofibrate (TRICOR) 145 MG tablet TAKE 1 TABLET BY MOUTH DAILY 90 tablet 3  . glipiZIDE (GLUCOTROL XL) 10 MG 24 hr tablet TAKE 1 TABLET BY MOUTH EVERY DAY WITH BREAKFAST 90 tablet 2  . glucose blood test strip 1 each by Other route daily. Use to check blood sugar daily Dx e11.9 100 each 11  . hydrochlorothiazide (MICROZIDE) 12.5 MG capsule TAKE 1 CAPSULE BY MOUTH DAILY 90 capsule 1  . hydrocortisone (ANUSOL-HC) 2.5 % rectal cream Place 1 application rectally as needed for hemorrhoids or anal itching.    . latanoprost (XALATAN) 0.005 % ophthalmic solution Place 1 drop into both eyes at bedtime.  3  . losartan (COZAAR) 100 MG tablet TAKE 1 TABLET BY MOUTH DAILY 90 tablet 1  . meclizine  (ANTIVERT) 12.5 MG tablet Take 1 tablet (12.5 mg total) by mouth 3 (three) times daily as needed for dizziness. 40 tablet 2  . metoprolol succinate (TOPROL-XL) 100 MG 24 hr tablet TAKE 1 TABLET(100 MG) BY MOUTH DAILY WITH OR IMMEDIATELY FOLLOWING A MEAL 90 tablet 2  . ondansetron (ZOFRAN ODT) 4 MG disintegrating tablet Take 1 tablet (4 mg total) by mouth every 8 (eight) hours as needed for nausea. 10 tablet 0  . ONETOUCH DELICA LANCETS 69G MISC USE TO HELP CHECK BLOOD SUGAR DAILY 200 each 0  . oxcarbazepine (TRILEPTAL) 600 MG tablet TAKE 1 TABLET(600 MG) BY MOUTH TWICE DAILY 180 tablet 1  . pantoprazole (PROTONIX) 40 MG tablet TAKE 1 TABLET BY MOUTH EVERY DAY 90 tablet 3  . pioglitazone (ACTOS) 45 MG tablet TAKE 1 TABLET BY MOUTH DAILY 90 tablet 2  .  potassium chloride (KLOR-CON) 10 MEQ tablet TAKE 2 TABLETS BY MOUTH DAILY 180 tablet 2  . prochlorperazine (COMPAZINE) 10 MG tablet Take 1 tablet (10 mg total) by mouth every 6 (six) hours as needed for nausea or vomiting. 30 tablet 1  . topiramate (TOPAMAX) 100 MG tablet TAKE 1 TABLET(100 MG) BY MOUTH DAILY 90 tablet 1   No current facility-administered medications on file prior to visit.   Review of Systems All otherwise neg per pt    Objective:   Physical Exam BP 130/80 (BP Location: Left Arm, Patient Position: Sitting, Cuff Size: Large)   Pulse 70   Temp 98.1 F (36.7 C) (Oral)   Ht _0  (1.753 m)   Wt 198 lb (89.8 kg)   SpO2 96%   BMI 29.24 kg/m  VS noted,  Constitutional: Pt appears in NAD HENT: Head: NCAT.  Right Ear: External ear normal.  Left Ear: External ear normal.  Eyes: . Pupils are equal, round, and reactive to light. Conjunctivae and EOM are normal Nose: without d/c or deformity Neck: Neck supple. Gross normal ROM Cardiovascular: Normal rate and regular rhythm.   Pulmonary/Chest: Effort normal and breath sounds without rales or wheezing.  Abd:  Soft, NT, ND, + BS, no organomegaly Neurological: Pt is alert. At  baseline orientation, motor grossly intact Skin: Skin is warm. No rashes, other new lesions, no LE edema Psychiatric: Pt behavior is normal without agitation  All otherwise neg per pt Lab Results  Component Value Date   WBC 6.0 03/13/2019   HGB 12.2 03/13/2019   HCT 37.9 03/13/2019   PLT 220.0 03/13/2019   GLUCOSE 68 (L) 11/05/2019   CHOL 180 11/05/2019   TRIG 175.0 (H) 11/05/2019   HDL 59.10 11/05/2019   LDLDIRECT 98.0 08/10/2018   LDLCALC 86 11/05/2019   ALT 18 11/05/2019   AST 23 11/05/2019   NA 138 11/05/2019   K 4.2 11/05/2019   CL 103 11/05/2019   CREATININE 1.06 11/05/2019   BUN 18 11/05/2019   CO2 27 11/05/2019   TSH 1.10 03/13/2019   INR 1.35 12/23/2016   HGBA1C 7.0 (H) 11/05/2019   MICROALBUR 6.6 (H) 03/13/2019    Lab Results  Component Value Date   HGBA1C 8.2 (H) 03/13/2019      Assessment & Plan:

## 2019-11-07 ENCOUNTER — Encounter: Payer: Self-pay | Admitting: Internal Medicine

## 2019-11-07 LAB — PTH, INTACT AND CALCIUM
Calcium: 9.9 mg/dL (ref 8.6–10.4)
PTH: 41 pg/mL (ref 14–64)

## 2019-11-07 NOTE — Telephone Encounter (Signed)
Form has been completed during OV by provider.

## 2019-11-12 ENCOUNTER — Telehealth: Payer: Self-pay | Admitting: Internal Medicine

## 2019-11-12 NOTE — Telephone Encounter (Signed)
Dulaglutide (TRULICITY) [phone] [place]  Fax # [phone] Phone# [phone] Needs a refill to be faxed to this pharmacy  Last apt- 11/05/19 Next apt- 05/05/20

## 2019-11-13 MED ORDER — TRULICITY 1.5 MG/0.5ML ~~LOC~~ SOAJ: 1.5000 mg | SUBCUTANEOUS | 3 refills | Status: DC

## 2019-11-13 NOTE — Telephone Encounter (Signed)
Ok done hardcopy °

## 2019-11-17 ENCOUNTER — Encounter: Payer: Self-pay | Admitting: Internal Medicine

## 2019-11-17 DIAGNOSIS — Z01818 Encounter for other preprocedural examination: Secondary | ICD-10-CM | POA: Insufficient documentation

## 2019-11-17 NOTE — Assessment & Plan Note (Addendum)
stable overall by history and exam, recent data reviewed with pt, and pt to continue medical treatment as before,  to f/u any worsening symptoms or concerns  I spent 31 minutes in preparing to see the patient by review of recent labs, imaging and procedures, obtaining and reviewing separately obtained history, communicating with the patient and family or caregiver, ordering medications, tests or procedures, and documenting clinical information in the EHR including the differential Dx, treatment, and any further evaluation and other management of hld, dm, htn, preop IM exam

## 2019-11-17 NOTE — Assessment & Plan Note (Signed)
stable overall by history and exam, recent data reviewed with pt, and pt to continue medical treatment as before,  to f/u any worsening symptoms or concerns  

## 2019-11-17 NOTE — Assessment & Plan Note (Signed)
Ok to hold asa x 5 days prior to dental procedure

## 2019-11-19 ENCOUNTER — Encounter: Payer: Self-pay | Admitting: Internal Medicine

## 2019-11-20 ENCOUNTER — Other Ambulatory Visit: Payer: Self-pay | Admitting: Internal Medicine

## 2019-11-25 ENCOUNTER — Other Ambulatory Visit: Payer: Self-pay | Admitting: Internal Medicine

## 2019-11-25 NOTE — Telephone Encounter (Signed)
Please refill as per office routine med refill policy (all routine meds refilled for 3 mo or monthly per pt preference up to one year from last visit, then month to month grace period for 3 mo, then further med refills will have to be denied)  

## 2019-12-09 ENCOUNTER — Other Ambulatory Visit: Payer: Self-pay

## 2019-12-09 NOTE — Patient Outreach (Signed)
  [facility]) Care Management Chronic Special Needs Program    12/09/2019  Name: Stacey [name], DOB: May 21, 1950  MRN: [id number]   [name] is enrolled in a chronic special needs plan for Diabetes. [facility] Care Management will continue to provide services for this member through 01/24/2020. The [facility] Care Management Team will assume care 01/25/2020.   Kathyrn Sheriff, RN, MSN, [facility] Chronic Care Management Coordinator [facility] [phone]

## 2019-12-19 ENCOUNTER — Other Ambulatory Visit: Payer: Self-pay | Admitting: Internal Medicine

## 2019-12-19 NOTE — Telephone Encounter (Signed)
Please refill as per office routine med refill policy (all routine meds refilled for 3 mo or monthly per pt preference up to one year from last visit, then month to month grace period for 3 mo, then further med refills will have to be denied)  

## 2020-01-06 ENCOUNTER — Ambulatory Visit (INDEPENDENT_AMBULATORY_CARE_PROVIDER_SITE_OTHER): Payer: HMO

## 2020-01-06 DIAGNOSIS — I442 Atrioventricular block, complete: Secondary | ICD-10-CM | POA: Diagnosis not present

## 2020-01-07 LAB — CUP PACEART REMOTE DEVICE CHECK
Battery Remaining Longevity: 53 mo
Battery Voltage: 2.98 V
Brady Statistic AP VP Percent: 15.72 %
Brady Statistic AP VS Percent: 0 %
Brady Statistic AS VP Percent: 83.12 %
Brady Statistic AS VS Percent: 1.17 %
Brady Statistic RA Percent Paced: 15.66 %
Brady Statistic RV Percent Paced: 98.64 %
Date Time Interrogation Session: 20211213112946
Implantable Lead Implant Date: 20160203
Implantable Lead Implant Date: 20160203
Implantable Lead Location: 753859
Implantable Lead Location: 753860
Implantable Lead Model: 5076
Implantable Lead Model: 5076
Implantable Pulse Generator Implant Date: 20160203
Lead Channel Impedance Value: 323 Ohm
Lead Channel Impedance Value: 418 Ohm
Lead Channel Impedance Value: 456 Ohm
Lead Channel Impedance Value: 456 Ohm
Lead Channel Pacing Threshold Amplitude: 0.625 V
Lead Channel Pacing Threshold Amplitude: 1.125 V
Lead Channel Pacing Threshold Pulse Width: 0.4 ms
Lead Channel Pacing Threshold Pulse Width: 0.4 ms
Lead Channel Sensing Intrinsic Amplitude: 2.375 mV
Lead Channel Sensing Intrinsic Amplitude: 2.375 mV
Lead Channel Sensing Intrinsic Amplitude: 7.375 mV
Lead Channel Sensing Intrinsic Amplitude: 7.375 mV
Lead Channel Setting Pacing Amplitude: 2.25 V
Lead Channel Setting Pacing Amplitude: 2.5 V
Lead Channel Setting Pacing Pulse Width: 0.4 ms
Lead Channel Setting Sensing Sensitivity: 2 mV

## 2020-01-09 ENCOUNTER — Telehealth: Payer: Self-pay | Admitting: Internal Medicine

## 2020-01-09 NOTE — Telephone Encounter (Signed)
Patient calling re: [employer] C[employer] form that she dropped off last week, Dr. Jonny Ruiz needs to complete pages 5, 6 and fax it back to [employer].  Please notify the patient when this has been completed.  Forms re-faxed from [employer] and placed in the Providers box

## 2020-01-20 NOTE — Progress Notes (Signed)
Remote pacemaker transmission.   

## 2020-01-22 NOTE — Telephone Encounter (Signed)
Patient called and is asking if the paperwork for [facility] has been faxed. Please call her back at [phone].

## 2020-01-28 ENCOUNTER — Other Ambulatory Visit: Payer: Self-pay

## 2020-01-28 NOTE — Telephone Encounter (Signed)
Patient calling back stating that they still have not received the forms at [facility], states this is urgent because they need to re-certify her medications. Patient is very upset and would like someone to call her back at [phone]

## 2020-01-29 NOTE — Telephone Encounter (Signed)
Documents have been re-faxed to [employer] on 01/27/2020 at 9:44 am.   **Called pt no answer, vm box was full.

## 2020-03-12 ENCOUNTER — Other Ambulatory Visit: Payer: Self-pay | Admitting: Internal Medicine

## 2020-03-12 DIAGNOSIS — Z1231 Encounter for screening mammogram for malignant neoplasm of breast: Secondary | ICD-10-CM

## 2020-03-17 ENCOUNTER — Other Ambulatory Visit: Payer: Self-pay

## 2020-03-17 ENCOUNTER — Ambulatory Visit
Admission: RE | Admit: 2020-03-17 | Discharge: 2020-03-17 | Disposition: A | Discharge status: Home / Self Care | Payer: HMO | Source: Ambulatory Visit | Attending: Internal Medicine | Admitting: Internal Medicine

## 2020-03-17 DIAGNOSIS — Z1231 Encounter for screening mammogram for malignant neoplasm of breast: Secondary | ICD-10-CM

## 2020-03-19 ENCOUNTER — Other Ambulatory Visit: Payer: Self-pay

## 2020-03-19 ENCOUNTER — Other Ambulatory Visit: Payer: HMO

## 2020-03-19 ENCOUNTER — Ambulatory Visit (INDEPENDENT_AMBULATORY_CARE_PROVIDER_SITE_OTHER): Payer: HMO

## 2020-03-19 ENCOUNTER — Ambulatory Visit (INDEPENDENT_AMBULATORY_CARE_PROVIDER_SITE_OTHER): Payer: HMO | Admitting: Nurse Practitioner

## 2020-03-19 ENCOUNTER — Encounter: Payer: Self-pay | Admitting: Nurse Practitioner

## 2020-03-19 VITALS — BP 138/70 | HR 78 | Temp 98.2°F | Ht 69.0 in | Wt 206.8 lb

## 2020-03-19 VITALS — BP 120/80 | HR 76 | Ht 66.5 in | Wt 206.8 lb

## 2020-03-19 DIAGNOSIS — R140 Abdominal distension (gaseous): Secondary | ICD-10-CM

## 2020-03-19 DIAGNOSIS — R1030 Lower abdominal pain, unspecified: Secondary | ICD-10-CM

## 2020-03-19 DIAGNOSIS — R143 Flatulence: Secondary | ICD-10-CM

## 2020-03-19 DIAGNOSIS — Z0000 Encounter for general adult medical examination without abnormal findings: Secondary | ICD-10-CM | POA: Diagnosis not present

## 2020-03-19 MED ORDER — DICYCLOMINE HCL 20 MG PO TABS: 20.0000 mg | ORAL_TABLET | Freq: Three times a day (TID) | ORAL | 3 refills | Status: AC | PRN

## 2020-03-19 NOTE — Patient Instructions (Addendum)
If you are age 69 or older, your body mass index should be between 23-30. Your Body mass index is 32.88 kg/m. If this is out of the aforementioned range listed, please consider follow up with your Primary Care Provider.  If you are age 54 or younger, your body mass index should be between 19-25. Your Body mass index is 32.88 kg/m. If this is out of the aformentioned range listed, please consider follow up with your Primary Care Provider.   Your provider has requested that you go to the basement level for lab work before leaving today. Press "B" on the elevator. The lab is located at the first door on the left as you exit the elevator.  START dicyclomine 20 mg 1 tablet before meals three times daily as needed for abdominal spasms.  Start following a low carb diet. Discuss this with your primary care physician as you may need to check your blood sugars more often.  We have scheduled you to follow up with Willette Cluster NP-C on April 16, 2020 at 8:30 am.  Thank you for entrusting me with your care and choosing [facility].  Willette Cluster, NP-C   https://www.diabeteseducator.org/docs/default-source/living-with-diabetes/conquering-the-grocery-store-v1.pdf?sfvrsn=4">  Carbohydrate Counting for Diabetes Mellitus, Adult Carbohydrate counting is a method of keeping track of how many carbohydrates you eat. Eating carbohydrates naturally increases the amount of sugar (glucose) in the blood. Counting how many carbohydrates you eat improves your blood glucose control, which helps you manage your diabetes. It is important to know how many carbohydrates you can safely have in each meal. This is different for every person. A dietitian can help you make a meal plan and calculate how many carbohydrates you should have at each meal and snack. What foods contain carbohydrates? Carbohydrates are found in the following foods:  Grains, such as breads and cereals.  Dried beans and soy  products.  Starchy vegetables, such as potatoes, peas, and corn.  Fruit and fruit juices.  Milk and yogurt.  Sweets and snack foods, such as cake, cookies, candy, chips, and soft drinks.   How do I count carbohydrates in foods? There are two ways to count carbohydrates in food. You can read food labels or learn standard serving sizes of foods. You can use either of the methods or a combination of both. Using the Nutrition Facts label The Nutrition Facts list is included on the labels of almost all packaged foods and beverages in the [place] It includes:  The serving size.  Information about nutrients in each serving, including the grams (g) of carbohydrate per serving. To use the Nutrition Facts:  Decide how many servings you will have.  Multiply the number of servings by the number of carbohydrates per serving.  The resulting number is the total amount of carbohydrates that you will be having. Learning the standard serving sizes of foods When you eat carbohydrate foods that are not packaged or do not include Nutrition Facts on the label, you need to measure the servings in order to count the amount of carbohydrates.  Measure the foods that you will eat with a food scale or measuring cup, if needed.  Decide how many standard-size servings you will eat.  Multiply the number of servings by 15. For foods that contain carbohydrates, one serving equals 15 g of carbohydrates. ? For example, if you eat 2 cups or 10 oz (300 g) of strawberries, you will have eaten 2 servings and 30 g of carbohydrates (2 servings x 15 g = 30 g).  For foods that have more than one food mixed, such as soups and casseroles, you must count the carbohydrates in each food that is included. The following list contains standard serving sizes of common carbohydrate-rich foods. Each of these servings has about 15 g of carbohydrates:  1 slice of bread.  1 six-inch (15 cm) tortilla.  ? cup or 2 oz (53 g) cooked rice  or pasta.   cup or 3 oz (85 g) cooked or canned, drained and rinsed beans or lentils.   cup or 3 oz (85 g) starchy vegetable, such as peas, corn, or squash.   cup or 4 oz (120 g) hot cereal.   cup or 3 oz (85 g) boiled or mashed potatoes, or  or 3 oz (85 g) of a large baked potato.   cup or 4 fl oz (118 mL) fruit juice.  1 cup or 8 fl oz (237 mL) milk.  1 small or 4 oz (106 g) apple.   or 2 oz (63 g) of a medium banana.  1 cup or 5 oz (150 g) strawberries.  3 cups or 1 oz (24 g) popped popcorn. What is an example of carbohydrate counting? To calculate the number of carbohydrates in this sample meal, follow the steps shown below. Sample meal  3 oz (85 g) chicken breast.  ? cup or 4 oz (106 g) brown rice.   cup or 3 oz (85 g) corn.  1 cup or 8 fl oz (237 mL) milk.  1 cup or 5 oz (150 g) strawberries with sugar-free whipped topping. Carbohydrate calculation 1. Identify the foods that contain carbohydrates: ? Rice. ? Corn. ? Milk. ? Strawberries. 2. Calculate how many servings you have of each food: ? 2 servings rice. ? 1 serving corn. ? 1 serving milk. ? 1 serving strawberries. 3. Multiply each number of servings by 15 g: ? 2 servings rice x 15 g = 30 g. ? 1 serving corn x 15 g = 15 g. ? 1 serving milk x 15 g = 15 g. ? 1 serving strawberries x 15 g = 15 g. 4. Add together all of the amounts to find the total grams of carbohydrates eaten: ? 30 g + 15 g + 15 g + 15 g = 75 g of carbohydrates total. What are tips for following this plan? Shopping  Develop a meal plan and then make a shopping list.  Buy fresh and frozen vegetables, fresh and frozen fruit, dairy, eggs, beans, lentils, and whole grains.  Look at food labels. Choose foods that have more fiber and less sugar.  Avoid processed foods and foods with added sugars. Meal planning  Aim to have the same amount of carbohydrates at each meal and for each snack time.  Plan to have regular, balanced  meals and snacks. Where to find more information  American Diabetes Association: www.diabetes.org  Centers for Disease Control and Prevention: FootballExhibition.com.br Summary  Carbohydrate counting is a method of keeping track of how many carbohydrates you eat.  Eating carbohydrates naturally increases the amount of sugar (glucose) in the blood.  Counting how many carbohydrates you eat improves your blood glucose control, which helps you manage your diabetes.  A dietitian can help you make a meal plan and calculate how many carbohydrates you should have at each meal and snack. This information is not intended to replace advice given to you by your health care provider. Make sure you discuss any questions you have with your health care provider.  Document Revised: 01/10/2019 Document Reviewed: 01/11/2019 Elsevier Patient Education  2021 [employer].

## 2020-03-19 NOTE — Progress Notes (Addendum)
  Subjective:   Stacey [name] is a 69 y.o. female who presents for Medicare Annual (Subsequent) preventive examination.  Review of Systems    No ROS. Medicare Wellness Visit. Additional risk factors are reflected in social history. Cardiac Risk Factors include: advanced age (>55men, >65 women);diabetes mellitus;dyslipidemia;hypertension;obesity (BMI >30kg/m2);family history of premature cardiovascular disease     Objective:    Today's Vitals   03/19/20 1406  BP: 138/70  Pulse: 78  Temp: 98.2 F (36.8 C)  SpO2: 93%  Weight: 206 lb 12.8 oz (93.8 kg)  Height: 5' 9" (1.753 m)  PainSc: 0-No pain   Body mass index is 30.54 kg/m.  Advanced Directives 03/19/2020 04/08/2019 07/11/2018 03/25/2018 03/25/2018 06/02/2017 01/26/2017  Does Patient Have a Medical Advance Directive? Yes Yes Yes No No No No  Type of Advance Directive Living will;Healthcare Power of Attorney Healthcare Power of Attorney;Living will Healthcare Power of Attorney;Living will - - - -  Does patient want to make changes to medical advance directive? No - Patient declined No - Patient declined No - Patient declined - - - -  Copy of Healthcare Power of Attorney in Chart? No - copy requested - - - - - -  Would patient like information on creating a medical advance directive? - - - No - Patient declined No - Patient declined No - Patient declined -    Current Medications (verified) Outpatient Encounter Medications as of 03/19/2020  Medication Sig  . acetaminophen (TYLENOL) 500 MG tablet Take 500 mg by mouth daily as needed.  . albuterol (VENTOLIN HFA) 108 (90 Base) MCG/ACT inhaler Inhale 2 puffs into the lungs every 6 (six) hours as needed for wheezing or shortness of breath.  . amLODipine (NORVASC) 10 MG tablet TAKE 1 TABLET BY MOUTH DAILY  . atorvastatin (LIPITOR) 40 MG tablet TAKE 1 TABLET BY MOUTH DAILY.  . Blood Glucose Monitoring Suppl (ONE TOUCH ULTRA 2) W/DEVICE KIT Use to check blood sugars daily Dx E11.9   . cetirizine (ZYRTEC) 10 MG tablet Take 10 mg by mouth daily.  . clonazePAM (KLONOPIN) 0.5 MG tablet TAKE 1 TABLET BY MOUTH TWICE DAILY AS NEEDED FOR ANXIETY  . dicyclomine (BENTYL) 20 MG tablet Take 1 tablet (20 mg total) by mouth 3 (three) times daily between meals as needed for spasms.  . Dulaglutide (TRULICITY) 1.5 MG/0.5ML SOPN Inject 1.5 mg into the skin once a week.  . fenofibrate (TRICOR) 145 MG tablet TAKE 1 TABLET BY MOUTH DAILY  . glipiZIDE (GLUCOTROL XL) 10 MG 24 hr tablet TAKE 1 TABLET BY MOUTH EVERY DAY WITH BREAKFAST  . glucose blood test strip 1 each by Other route daily. Use to check blood sugar daily Dx e11.9  . hydrochlorothiazide (MICROZIDE) 12.5 MG capsule TAKE 1 CAPSULE BY MOUTH DAILY  . hydrocortisone (ANUSOL-HC) 2.5 % rectal cream Place 1 application rectally as needed for hemorrhoids or anal itching.  . latanoprost (XALATAN) 0.005 % ophthalmic solution Place 1 drop into both eyes at bedtime.  . losartan (COZAAR) 100 MG tablet TAKE 1 TABLET BY MOUTH DAILY  . metoprolol succinate (TOPROL-XL) 100 MG 24 hr tablet TAKE 1 TABLET(100 MG) BY MOUTH DAILY WITH OR IMMEDIATELY FOLLOWING A MEAL  . ondansetron (ZOFRAN ODT) 4 MG disintegrating tablet Take 1 tablet (4 mg total) by mouth every 8 (eight) hours as needed for nausea.  . ONETOUCH DELICA LANCETS 33G MISC USE TO HELP CHECK BLOOD SUGAR DAILY  . oxcarbazepine (TRILEPTAL) 600 MG tablet TAKE 1 TABLET(600 MG) BY MOUTH   TWICE DAILY  . pantoprazole (PROTONIX) 40 MG tablet TAKE 1 TABLET BY MOUTH EVERY DAY  . pioglitazone (ACTOS) 45 MG tablet TAKE 1 TABLET BY MOUTH DAILY  . potassium chloride (KLOR-CON) 10 MEQ tablet TAKE 2 TABLETS BY MOUTH DAILY  . prochlorperazine (COMPAZINE) 10 MG tablet Take 1 tablet (10 mg total) by mouth every 6 (six) hours as needed for nausea or vomiting.  . topiramate (TOPAMAX) 100 MG tablet TAKE 1 TABLET(100 MG) BY MOUTH DAILY   No facility-administered encounter medications on file as of 03/19/2020.     Allergies (verified) Dairy aid [lactase], Metformin and related, Ace inhibitors, Aspirin, Codeine, and Soy allergy   History: Past Medical History:  Diagnosis Date  . ANGIOEDEMA 03/07/2008   a. with ACE-I  . ANXIETY 09/16/2006  . ASTHMA 08/01/2006  . CAD (coronary artery disease)    a. 01/2010 : Minimal plaque at cardiac catheterization - CFX 20%, EF 70%;  b. 08/2012 Cath: Nl Cors, EF 65%.  . Chronic diastolic CHF (congestive heart failure) ([place]) 04/06/2010   a. In setting of HOCM.  Marland Kitchen Complete heart block ([place]) 02/26/2014   a. s/p MDT dual chamber pacemaker 02/2014  . COPD (chronic obstructive pulmonary disease) ([place])   . DEPRESSION 09/16/2006  . DIVERTICULOSIS, COLON, WITH HEMORRHAGE 04/06/2010  . DM (diabetes mellitus) in pregnancy, delivered w/postpartum condition 08/30/2010  . HYPERLIPIDEMIA 08/01/2006  . HYPERTENSION 08/01/2006  . 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 s/p septal myomectomy 4/12 at [facility] with Dr. Evelina Dun;  b. echo 5/12: EF 60-65%, LVOT peak 18 mmHg; grade 1 diast dysfxn, mild SAM (improved since myomectomy;  c. 03/2012 Echo: EF 60-65%, mod-sev basal septal asymm hypertrophy, basal septal HK, LVOT grad 75mHg, Gr 1 DD, , SAM, Mild MR, nl RV, PASP 368mg; 08/2012 Echo: technically difficult, doubt LVOT obstruction, EF 60%, Gr 1 DD, mod-sev dil LA.  . Impaired glucose tolerance 06/25/2010  . LBBB (left bundle branch block) 06/17/2010  . OBESITY 09/16/2006  . [place]al artery aneurysm [place]  . SICKLE CELL ANEMIA 08/01/2006  . Type II or unspecified type diabetes mellitus without mention of complication, uncontrolled 08/30/2010  . VENTRAL HERNIA 12/07/2006   Past Surgical History:  Procedure Laterality Date  . ABDOMINAL HYSTERECTOMY  1987  . CARDIAC SURGERY    . COLONOSCOPY N/A 12/26/2016   Procedure: COLONOSCOPY;  Surgeon: JaMilus BanisterMD;  Location: [facility] END[facility]COPY;  Service: Endoscopy;  Laterality: N/A;  . ELECTROPHYSIOLOGY STUDY N/A 09/12/2012   Procedure: ELECTROPHYSIOLOGY STUDY;   Surgeon: StDeboraha SprangMD;  [facility];  Service: Cardiovascular;  Laterality: N/A;  . LEFT HEART CATH  Aug. 18, 2014   Medtronic heart device  . LEFT HEART CATHETERIZATION WITH CORONARY ANGIOGRAM N/A 09/10/2012   Procedure: LEFT HEART CATHETERIZATION WITH CORONARY ANGIOGRAM;  Surgeon: ChBurnell BlanksMD;  [facility];  Service: Cardiovascular;  Laterality: N/A;  . MYOMECTOMY     Septal  . PERMANENT PACEMAKER INSERTION N/A 02/26/2014   MDT Advisa dual chamber pacemaker implanted by Dr KlCaryl Comesor heart block  . VENTRAL HERNIA REPAIR  06/2006   Family History  Adopted: Yes  Problem Relation Age of Onset  . Heart disease Father   . Heart attack Father   . Hypertension Father        before age 996. Colon cancer Daughter        and bleeding disorder  . Hyperlipidemia Daughter   . Heart disease Daughter   . Heart  attack Sister   . Hyperlipidemia Sister   . Stroke Sister   . Hypertension Sister   . Hyperlipidemia Brother   . Hypertension Brother   . Diabetes Paternal Grandmother   . Clotting disorder Daughter   . Clotting disorder Other        granddaughter  . Hypertension Son    Social History   Socioeconomic History  . Marital status: Widowed    Spouse name: Not on file  . Number of children: 3  . Years of education: Not on file  . Highest education level: Not on file  Occupational History  . Occupation: retired teacher    Employer: [employer]  Tobacco Use  . Smoking status: Former Smoker    Packs/day: 0.25    Years: 20.00    Pack years: 5.00    Types: Cigarettes    Quit date: 01/24/1998    Years since quitting: 22.1  . Smokeless tobacco: Never Used  . Tobacco comment: Quit smoking 2001. Smoked on and off for 20 years. Smoked 2-3 cigars daily  Vaping Use  . Vaping Use: Never used  Substance and Sexual Activity  . Alcohol use: No    Alcohol/week: 0.0 standard drinks  . Drug use: No  . Sexual activity: Not Currently  Other  Topics Concern  . Not on file  Social History Narrative   Lives in [place] with husband.  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   Social Determinants of Health   Financial Resource Strain: Low Risk   . Difficulty of Paying Living Expenses: Not hard at all  Food Insecurity: No Food Insecurity  . Worried About Running Out of Food in the Last Year: Never true  . Ran Out of Food in the Last Year: Never true  Transportation Needs: No Transportation Needs  . Lack of Transportation (Medical): No  . Lack of Transportation (Non-Medical): No  Physical Activity: Inactive  . Days of Exercise per Week: 0 days  . Minutes of Exercise per Session: 0 min  Stress: No Stress Concern Present  . Feeling of Stress : Not at all  Social Connections: Moderately Integrated  . Frequency of Communication with Friends and Family: More than three times a week  . Frequency of Social Gatherings with Friends and Family: Once a week  . Attends Religious Services: More than 4 times per year  . Active Member of Clubs or Organizations: Yes  . Attends Club or Organization Meetings: More than 4 times per year  . Marital Status: Widowed    Tobacco Counseling Counseling given: Not Answered Comment: Quit smoking 2001. Smoked on and off for 20 years. Smoked 2-3 cigars daily   Clinical Intake:  Pre-visit preparation completed: Yes  Pain : No/denies pain Pain Score: 0-No pain     BMI - recorded: 30.54 Nutritional Status: BMI > 30  Obese Nutritional Risks: None Diabetes: Yes CBG done?: No Did pt. bring in CBG monitor from home?: No  How often do you need to have someone help you when you read instructions, pamphlets, or other written materials from your doctor or pharmacy?: 1 - Never What is the last grade level you completed in school?: Graduate Degree  Diabetic? yes  Interpreter Needed?: No  Information entered by :: Shenika Hatfield, LPN   Activities of Daily Living In your  present state of health, do you have any difficulty performing the following activities: 03/19/2020 04/08/2019  Hearing? N N  Comment scheduled for hearing   exam 03/23/2020 -  Vision? N N  Difficulty concentrating or making decisions? N N  Walking or climbing stairs? Y N  Comment lives on main level of house -  Dressing or bathing? N N  Doing errands, shopping? N N  Preparing Food and eating ? N N  Using the Toilet? N N  In the past six months, have you accidently leaked urine? N N  Do you have problems with loss of bowel control? N N  Managing your Medications? N N  Managing your Finances? N N  Housekeeping or managing your Housekeeping? N N  Some recent data might be hidden    Patient Care Team: Biagio Borg, MD as PCP - General (Internal Medicine) Deboraha Sprang, MD as Consulting Physician (Cardiology) Gatha Mayer, MD as Consulting Physician (Gastroenterology)  Indicate any recent Medical Services you may have received from other than [facility] providers in the past year (date may be approximate).     Assessment:   This is a routine wellness examination for [place].  Hearing/Vision screen No exam data present  Dietary issues and exercise activities discussed: Current Exercise Habits: The patient does not participate in regular exercise at present, Exercise limited by: respiratory conditions(s);cardiac condition(s)  Goals    .  Patient Stated: (pt-stated)      Take travel day trips for my enjoyment, start to go to [employer]. Take a train trip in the future and go to my family reunion in September. Enjoy my family and grand-children.   Client would like to renew this as a goal (2021)-Pandemic affected this goal. Receive your Covid-19 vaccine as you have scheduled. Continue your Covid-19 precautions: wear your mask; wait at least 6 feet distance from others; wash your hands continues to be important.      Depression Screen PHQ 2/9 Scores 03/19/2020 04/08/2019  03/08/2019 08/10/2018 07/11/2018 01/11/2018 06/02/2017  PHQ - 2 Score 0 0 0 1 2 0 3  PHQ- 9 Score - - - - 4 - 4    Fall Risk Fall Risk  03/19/2020 03/08/2019 08/10/2018 01/11/2018 06/02/2017  Falls in the past year? 0 0 0 0 No  Number falls in past yr: 0 - - - -  Injury with Fall? 0 - - - -  Risk for fall due to : No Fall Risks - - - -    FALL RISK PREVENTION PERTAINING TO THE HOME:  Any stairs in or around the home? Yes  If so, are there any without handrails? No  Home free of loose throw rugs in walkways, pet beds, electrical cords, etc? Yes  Adequate lighting in your home to reduce risk of falls? Yes   ASSISTIVE DEVICES UTILIZED TO PREVENT FALLS:  Life alert? No  Use of a cane, walker or w/c? No  Grab bars in the bathroom? Yes  Shower chair or bench in shower? Yes  Elevated toilet seat or a handicapped toilet? Yes   TIMED UP AND GO:  Was the test performed? No .  Length of time to ambulate 10 feet: 0 sec.   Gait steady and fast without use of assistive device  Cognitive Function: Normal cognitive status assessed by direct observation by this Nurse Health Advisor. No abnormalities found.          Immunizations Immunization History  Administered Date(s) Administered  . Influenza Whole 11/10/2008, 10/13/2009  . Influenza, High Dose Seasonal PF 10/26/2016, 10/27/2017  . Influenza,inj,Quad PF,6+ Mos 10/23/2013  . Influenza-Unspecified 12/04/2014, 11/05/2019  .  PFIZER(Purple Top)SARS-COV-2 Vaccination 04/29/2019, 05/20/2019, 11/19/2019  . Pneumococcal Conjugate-13 01/10/2017  . Pneumococcal Polysaccharide-23 09/29/2005, 08/30/2010, 01/28/2014  . Td 01/24/1993, 12/10/2008    TDAP status: Due, Education has been provided regarding the importance of this vaccine. Advised may receive this vaccine at local pharmacy or Health Dept. Aware to provide a copy of the vaccination record if obtained from local pharmacy or Health Dept. Verbalized acceptance and understanding.  Flu  Vaccine status: Up to date  Pneumococcal vaccine status: Up to date  Covid-19 vaccine status: Completed vaccines  Qualifies for Shingles Vaccine? Yes   Zostavax completed No   Shingrix Completed?: No.    Education has been provided regarding the importance of this vaccine. Patient has been advised to call insurance company to determine out of pocket expense if they have not yet received this vaccine. Advised may also receive vaccine at local pharmacy or Health Dept. Verbalized acceptance and understanding.  Screening Tests Health Maintenance  Topic Date Due  . TETANUS/TDAP  12/11/2018  . FOOT EXAM  01/12/2019  . PNA vac Low Risk Adult (2 of 2 - PPSV23) 01/29/2019  . HEMOGLOBIN A1C  05/05/2020  . OPHTHALMOLOGY EXAM  06/30/2020  . MAMMOGRAM  12/30/2020  . COLONOSCOPY (Pts 45-3yr Insurance coverage will need to be confirmed)  12/26/2021  . INFLUENZA VACCINE  Completed  . DEXA SCAN  Completed  . COVID-19 Vaccine  Completed  . Hepatitis C Screening  Completed    Health Maintenance  Health Maintenance Due  Topic Date Due  . TETANUS/TDAP  12/11/2018  . FOOT EXAM  01/12/2019  . PNA vac Low Risk Adult (2 of 2 - PPSV23) 01/29/2019    Colorectal cancer screening: Type of screening: Colonoscopy. Completed 12/26/2016. Repeat every 5 years  Mammogram status: Completed 02/2020. Repeat every year  Bone Density status: Completed 08/10/2017. Results reflect: Bone density results: OSTEOPENIA. Repeat every 2-3 years.  Lung Cancer Screening: (Low Dose CT Chest recommended if Age 69-80years, 30 pack-year currently smoking OR have quit w/in 15years.) does qualify.   Lung Cancer Screening Referral: no  Additional Screening:  Hepatitis C Screening: does qualify; Completed yes  Vision Screening: Recommended annual ophthalmology exams for early detection of glaucoma and other disorders of the eye. Is the patient up to date with their annual eye exam?  Yes  Who is the provider or what is the  name of the office in which the patient attends annual eye exams? [facility]. If pt is not established with a provider, would they like to be referred to a provider to establish care? No .   Dental Screening: Recommended annual dental exams for proper oral hygiene  Community Resource Referral / Chronic Care Management: CRR required this visit?  No   CCM required this visit?  No      Plan:     I have personally reviewed and noted the following in the patient's chart:   . Medical and social history . Use of alcohol, tobacco or illicit drugs  . Current medications and supplements . Functional ability and status . Nutritional status . Physical activity . Advanced directives . List of other physicians . Hospitalizations, surgeries, and ER visits in previous 12 months . Vitals . Screenings to include cognitive, depression, and falls . Referrals and appointments  In addition, I have reviewed and discussed with patient certain preventive protocols, quality metrics, and best practice recommendations. A written personalized care plan for preventive services as well as general preventive health recommendations were provided  to patient.     Sheral Flow, LPN   [id number]   Nurse Notes:  Medications reviewed with patient; no opioid use noted. Patient advised to start low carb diet per Dr. Sabino Donovan at [facility].

## 2020-03-19 NOTE — Progress Notes (Signed)
ASSESSMENT AND PLAN    # 70 yo female with several week history of generalized, crampy lower abdominal pain improved with heat and splinting. Etiology unclear. She denies constipation. Musculoskeletal pain? 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  --Doubt right spigelian hernia is source of her abdomina pain but will ask Dr. Ardis Hughs to review scan.  -- She can try Dicyclomine in case this is intestinal related pain.   # Chronic bloating --tTg, IgA  # Chronic mild, microcytic anemia  # [place] of colon cancer. A five year recall colonoscopy recommended at time of colonoscopy Oct 2015 but she had one December 2018 ( for bleeding) so new recall now 2023  # Diabetes, A1c was 7 in October, down from 8 in Feb 2021. On Trulicity x 2 years, dose increased a year ago.   HISTORY OF PRESENT ILLNESS     Primary Gastroenterologist : Oretha Caprice, MD      Chief Complaint : lower abdominal pain   Stacey [name] is a 70 y.o. female with PMH / [place] significant for diverticular bleed, DM, chronic diastolic CHF, pacemaker.   Stacey [name] is hear with complaints of worsening of her chronic symptoms. About once a month she gets episoses of diffuse generalized crampy abdominal discomfort . No rectal bleeding. Feels like menstrual cramp. The pain gets worse if she bends. No vaginal bleeding. Having a BM doesn't help the discomfort. Heat and splinting with hands helps. No fevers.   Additionally she complains of constant bloating with excessive flatus. She has significant bloating.  She has a limited diet, avoids or at least limits dairy,  roughage, nuts, soy and other triggers of the abdominal pain, bloating and gas. Her main concern is the abdominal bloating / distention. She hasn't seen a GYN in years but is s/p total hysterectomy 1987. She adamantly denies constipation , actually has a BM after every meal. No. blood in stool.  Her weight is up 10 pounds since January 2021. She does have occasional nausea but this just started.   She has a history of GERD but is asymptomatic on pantoprazole.   Patient checks cbg qam and blood sugars stay under 120.    Data Reviewed: October 2021 CMET unremarkable   Previous Endoscopic Evaluations / Pertinent Studies:   December 2018 colonoscopy for hematochezia --Pandiverticulosis, no bleeding diverticulum  Past Medical History:  Diagnosis Date  . ANGIOEDEMA 03/07/2008   a. with ACE-I  . ANXIETY 09/16/2006  . ASTHMA 08/01/2006  . CAD (coronary artery disease)    a. 01/2010 : Minimal plaque at cardiac catheterization - CFX 20%, EF 70%;  b. 08/2012 Cath: Nl Cors, EF 65%.  . Chronic diastolic CHF (congestive heart failure) ([place]) 04/06/2010   a. In setting of HOCM.  Marland Kitchen Complete heart block ([place]) 02/26/2014   a. s/p MDT dual chamber pacemaker 02/2014  . COPD (chronic obstructive pulmonary disease) ([place])   . DEPRESSION 09/16/2006  . DIVERTICULOSIS, COLON, WITH HEMORRHAGE 04/06/2010  . DM (diabetes mellitus) in pregnancy, delivered w/postpartum condition 08/30/2010  . HYPERLIPIDEMIA 08/01/2006  . HYPERTENSION 08/01/2006  . 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 s/p septal myomectomy 4/12 at [place] with Dr. Evelina Dun;  b. echo 5/12: EF 60-65%, LVOT peak 18 mmHg; grade 1 diast dysfxn, mild SAM (improved since myomectomy;  c. 03/2012 Echo: EF 60-65%, mod-sev basal septal asymm hypertrophy, basal septal HK, LVOT grad 28mmHg, Gr 1 DD, , SAM,  Mild MR, nl RV, PASP 35mmHg; 08/2012 Echo: technically difficult, doubt LVOT obstruction, EF 60%, Gr 1 DD, mod-sev dil LA.  . Impaired glucose tolerance 06/25/2010  . LBBB (left bundle branch block) 06/17/2010  . OBESITY 09/16/2006  . 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 SICKLE CELL ANEMIA 08/01/2006  . Type II or unspecified type diabetes mellitus without mention of complication, uncontrolled 08/30/2010  . VENTRAL HERNIA 12/07/2006    Current Medications, Allergies, Past  Surgical History, Family History and Social History were reviewed in [employer] record.   Current Outpatient Medications  Medication Sig Dispense Refill  . acetaminophen (TYLENOL) 500 MG tablet Take 500 mg by mouth daily as needed.    Marland Kitchen albuterol (VENTOLIN HFA) 108 (90 Base) MCG/ACT inhaler Inhale 2 puffs into the lungs every 6 (six) hours as needed for wheezing or shortness of breath. 18 g 5  . amLODipine (NORVASC) 10 MG tablet TAKE 1 TABLET BY MOUTH DAILY 90 tablet 3  . atorvastatin (LIPITOR) 40 MG tablet TAKE 1 TABLET BY MOUTH DAILY. 90 tablet 3  . Blood Glucose Monitoring Suppl (ONE TOUCH ULTRA 2) W/DEVICE KIT Use to check blood sugars daily Dx E11.9 1 each 0  . cetirizine (ZYRTEC) 10 MG tablet Take 10 mg by mouth daily.    . clonazePAM (KLONOPIN) 0.5 MG tablet TAKE 1 TABLET BY MOUTH TWICE DAILY AS NEEDED FOR ANXIETY 30 tablet 2  . Dulaglutide (TRULICITY) 1.5 KN/3.9JQ SOPN Inject 1.5 mg into the skin once a week. 6 mL 3  . fenofibrate (TRICOR) 145 MG tablet TAKE 1 TABLET BY MOUTH DAILY 90 tablet 3  . glipiZIDE (GLUCOTROL XL) 10 MG 24 hr tablet TAKE 1 TABLET BY MOUTH EVERY DAY WITH BREAKFAST 90 tablet 2  . glucose blood test strip 1 each by Other route daily. Use to check blood sugar daily Dx e11.9 100 each 11  . hydrochlorothiazide (MICROZIDE) 12.5 MG capsule TAKE 1 CAPSULE BY MOUTH DAILY 90 capsule 1  . hydrocortisone (ANUSOL-HC) 2.5 % rectal cream Place 1 application rectally as needed for hemorrhoids or anal itching.    . latanoprost (XALATAN) 0.005 % ophthalmic solution Place 1 drop into both eyes at bedtime.  3  . losartan (COZAAR) 100 MG tablet TAKE 1 TABLET BY MOUTH DAILY 90 tablet 1  . metoprolol succinate (TOPROL-XL) 100 MG 24 hr tablet TAKE 1 TABLET(100 MG) BY MOUTH DAILY WITH OR IMMEDIATELY FOLLOWING A MEAL 90 tablet 2  . ondansetron (ZOFRAN ODT) 4 MG disintegrating tablet Take 1 tablet (4 mg total) by mouth every 8 (eight) hours as needed for nausea. 10  tablet 0  . ONETOUCH DELICA LANCETS 73A MISC USE TO HELP CHECK BLOOD SUGAR DAILY 200 each 0  . oxcarbazepine (TRILEPTAL) 600 MG tablet TAKE 1 TABLET(600 MG) BY MOUTH TWICE DAILY 180 tablet 1  . pantoprazole (PROTONIX) 40 MG tablet TAKE 1 TABLET BY MOUTH EVERY DAY 90 tablet 3  . pioglitazone (ACTOS) 45 MG tablet TAKE 1 TABLET BY MOUTH DAILY 90 tablet 2  . potassium chloride (KLOR-CON) 10 MEQ tablet TAKE 2 TABLETS BY MOUTH DAILY 180 tablet 2  . prochlorperazine (COMPAZINE) 10 MG tablet Take 1 tablet (10 mg total) by mouth every 6 (six) hours as needed for nausea or vomiting. 30 tablet 1  . topiramate (TOPAMAX) 100 MG tablet TAKE 1 TABLET(100 MG) BY MOUTH DAILY 90 tablet 1   No current facility-administered medications for this visit.    Review of Systems: No chest pain. No shortness  of breath. No urinary complaints.   PHYSICAL EXAM :    Wt Readings from Last 3 Encounters:  03/19/20 206 lb 12.8 oz (93.8 kg)  11/05/19 198 lb (89.8 kg)  04/29/19 197 lb (89.4 kg)    Ht 5' 6.5" (1.689 m)   Wt 206 lb 12.8 oz (93.8 kg)   BMI 32.88 kg/m  Constitutional:  Pleasant female in no acute distress. Psychiatric: Normal mood and affect. Behavior is normal. EENT: Pupils normal.  Conjunctivae are normal. No scleral icterus. Neck supple.  Cardiovascular: Normal rate, regular rhythm. No edema Pulmonary/chest: Effort normal and breath sounds normal. No wheezing, rales or rhonchi. Abdominal: Soft, nondistended, nontender. Bowel sounds active throughout. There are no masses palpable. No hepatomegaly. Neurological: Alert and oriented to person place and time. Skin: Skin is warm and dry. No rashes noted.  Tye Savoy, NP  03/19/2020, 8:36 AM

## 2020-03-19 NOTE — Patient Instructions (Addendum)
Stacey [name] , Thank you for taking time to come for your Medicare Wellness Visit. I appreciate your ongoing commitment to your health goals. Please review the following plan we discussed and let me know if I can assist you in the future.   Screening recommendations/referrals: Colonoscopy: 12/26/2016; due every 5 years (2023) Mammogram: 02/26/2020 Bone Density: 08/10/2017; due every 2-3 years (2022) Recommended yearly ophthalmology/optometry visit for glaucoma screening and checkup Recommended yearly dental visit for hygiene and checkup  Vaccinations: Influenza vaccine: 11/05/2019 Pneumococcal vaccine: 01/28/2014, 01/10/2017 Tdap vaccine: 12/10/2008; due every 10 years (2020)  can check with local pharmacy for eligibility and price   Shingles vaccine: never done; can check with local pharmacy for eligibility and price   Covid-19: 04/29/2019, 05/20/2019, booster  Advanced directives: Advance directive discussed with you today. Even though you declined this today please call our office should you change your mind and we can give you the proper paperwork for you to fill out.  Conditions/risks identified: Yes; Reviewed health maintenance screenings with patient today and relevant education, vaccines, and/or referrals were provided. Please continue to do your personal lifestyle choices by: daily care of teeth and gums, regular physical activity (goal should be 5 days a week for 30 minutes), eat a healthy diet, avoid tobacco and drug use, limiting any alcohol intake, taking a low-dose aspirin (if not allergic or have been advised by your provider otherwise) and taking vitamins and minerals as recommended by your provider. Continue doing brain stimulating activities (puzzles, reading, adult coloring books, staying active) to keep memory sharp. Continue to eat heart healthy diet (full of fruits, vegetables, whole grains, lean protein, water--limit salt, fat, and sugar intake) and increase physical activity  as tolerated.  Next appointment: Please schedule your next Medicare Wellness Visit with your Nurse Health Advisor in 1 year by calling [phone].   Preventive Care 69 Years and Older, Female Preventive care refers to lifestyle choices and visits with your health care provider that can promote health and wellness. What does preventive care include?  A yearly physical exam. This is also called an annual well check.  Dental exams once or twice a year.  Routine eye exams. Ask your health care provider how often you should have your eyes checked.  Personal lifestyle choices, including:  Daily care of your teeth and gums.  Regular physical activity.  Eating a healthy diet.  Avoiding tobacco and drug use.  Limiting alcohol use.  Practicing safe sex.  Taking low-dose aspirin every day.  Taking vitamin and mineral supplements as recommended by your health care provider. What happens during an annual well check? The services and screenings done by your health care provider during your annual well check will depend on your age, overall health, lifestyle risk factors, and family history of disease. Counseling  Your health care provider may ask you questions about your:  Alcohol use.  Tobacco use.  Drug use.  Emotional well-being.  Home and relationship well-being.  Sexual activity.  Eating habits.  History of falls.  Memory and ability to understand (cognition).  Work and work Astronomer.  Reproductive health. Screening  You may have the following tests or measurements:  Height, weight, and BMI.  Blood pressure.  Lipid and cholesterol levels. These may be checked every 5 years, or more frequently if you are over 24 years old.  Skin check.  Lung cancer screening. You may have this screening every year starting at age 2 if you have a 30-pack-year history of smoking and currently  smoke or have quit within the past 15 years.  Fecal occult blood test (FOBT)  of the stool. You may have this test every year starting at age 29.  Flexible sigmoidoscopy or colonoscopy. You may have a sigmoidoscopy every 5 years or a colonoscopy every 10 years starting at age 47.  Hepatitis C blood test.  Hepatitis B blood test.  Sexually transmitted disease (STD) testing.  Diabetes screening. This is done by checking your blood sugar (glucose) after you have not eaten for a while (fasting). You may have this done every 1-3 years.  Bone density scan. This is done to screen for osteoporosis. You may have this done starting at age 55.  Mammogram. This may be done every 1-2 years. Talk to your health care provider about how often you should have regular mammograms. Talk with your health care provider about your test results, treatment options, and if necessary, the need for more tests. Vaccines  Your health care provider may recommend certain vaccines, such as:  Influenza vaccine. This is recommended every year.  Tetanus, diphtheria, and acellular pertussis (Tdap, Td) vaccine. You may need a Td booster every 10 years.  Zoster vaccine. You may need this after age 72.  Pneumococcal 13-valent conjugate (PCV13) vaccine. One dose is recommended after age 67.  Pneumococcal polysaccharide (PPSV23) vaccine. One dose is recommended after age 18. Talk to your health care provider about which screenings and vaccines you need and how often you need them. This information is not intended to replace advice given to you by your health care provider. Make sure you discuss any questions you have with your health care provider. Document Released: 02/06/2015 Document Revised: 09/30/2015 Document Reviewed: 11/11/2014 Elsevier Interactive Patient Education  2017 [employer].  Fall Prevention in the Home Falls can cause injuries. They can happen to people of all ages. There are many things you can do to make your home safe and to help prevent falls. What can I do on the outside of  my home?  Regularly fix the edges of walkways and driveways and fix any cracks.  Remove anything that might make you trip as you walk through a door, such as a raised step or threshold.  Trim any bushes or trees on the path to your home.  Use bright outdoor lighting.  Clear any walking paths of anything that might make someone trip, such as rocks or tools.  Regularly check to see if handrails are loose or broken. Make sure that both sides of any steps have handrails.  Any raised decks and porches should have guardrails on the edges.  Have any leaves, snow, or ice cleared regularly.  Use sand or salt on walking paths during winter.  Clean up any spills in your garage right away. This includes oil or grease spills. What can I do in the bathroom?  Use night lights.  Install grab bars by the toilet and in the tub and shower. Do not use towel bars as grab bars.  Use non-skid mats or decals in the tub or shower.  If you need to sit down in the shower, use a plastic, non-slip stool.  Keep the floor dry. Clean up any water that spills on the floor as soon as it happens.  Remove soap buildup in the tub or shower regularly.  Attach bath mats securely with double-sided non-slip rug tape.  Do not have throw rugs and other things on the floor that can make you trip. What can I do in  the bedroom?  Use night lights.  Make sure that you have a light by your bed that is easy to reach.  Do not use any sheets or blankets that are too big for your bed. They should not hang down onto the floor.  Have a firm chair that has side arms. You can use this for support while you get dressed.  Do not have throw rugs and other things on the floor that can make you trip. What can I do in the kitchen?  Clean up any spills right away.  Avoid walking on wet floors.  Keep items that you use a lot in easy-to-reach places.  If you need to reach something above you, use a strong step stool that has  a grab bar.  Keep electrical cords out of the way.  Do not use floor polish or wax that makes floors slippery. If you must use wax, use non-skid floor wax.  Do not have throw rugs and other things on the floor that can make you trip. What can I do with my stairs?  Do not leave any items on the stairs.  Make sure that there are handrails on both sides of the stairs and use them. Fix handrails that are broken or loose. Make sure that handrails are as long as the stairways.  Check any carpeting to make sure that it is firmly attached to the stairs. Fix any carpet that is loose or worn.  Avoid having throw rugs at the top or bottom of the stairs. If you do have throw rugs, attach them to the floor with carpet tape.  Make sure that you have a light switch at the top of the stairs and the bottom of the stairs. If you do not have them, ask someone to add them for you. What else can I do to help prevent falls?  Wear shoes that:  Do not have high heels.  Have rubber bottoms.  Are comfortable and fit you well.  Are closed at the toe. Do not wear sandals.  If you use a stepladder:  Make sure that it is fully opened. Do not climb a closed stepladder.  Make sure that both sides of the stepladder are locked into place.  Ask someone to hold it for you, if possible.  Clearly mark and make sure that you can see:  Any grab bars or handrails.  First and last steps.  Where the edge of each step is.  Use tools that help you move around (mobility aids) if they are needed. These include:  Canes.  Walkers.  Scooters.  Crutches.  Turn on the lights when you go into a dark area. Replace any light bulbs as soon as they burn out.  Set up your furniture so you have a clear path. Avoid moving your furniture around.  If any of your floors are uneven, fix them.  If there are any pets around you, be aware of where they are.  Review your medicines with your doctor. Some medicines can  make you feel dizzy. This can increase your chance of falling. Ask your doctor what other things that you can do to help prevent falls. This information is not intended to replace advice given to you by your health care provider. Make sure you discuss any questions you have with your health care provider. Document Released: 11/06/2008 Document Revised: 06/18/2015 Document Reviewed: 02/14/2014 Elsevier Interactive Patient Education  2017 [employer]

## 2020-03-20 LAB — TISSUE TRANSGLUTAMINASE, IGA: (tTG) Ab, IgA: <1 U/mL

## 2020-03-20 LAB — IGA: Immunoglobulin A: 228 mg/dL (ref 70–320)

## 2020-03-24 ENCOUNTER — Encounter: Payer: Self-pay | Admitting: Nurse Practitioner

## 2020-03-25 NOTE — Progress Notes (Signed)
I agree with the above note, plan 

## 2020-04-06 ENCOUNTER — Ambulatory Visit (INDEPENDENT_AMBULATORY_CARE_PROVIDER_SITE_OTHER): Payer: HMO

## 2020-04-06 DIAGNOSIS — I442 Atrioventricular block, complete: Secondary | ICD-10-CM

## 2020-04-07 LAB — CUP PACEART REMOTE DEVICE CHECK
Battery Remaining Longevity: 51 mo
Battery Voltage: 2.98 V
Brady Statistic AP VP Percent: 11.45 %
Brady Statistic AP VS Percent: 0 %
Brady Statistic AS VP Percent: 88.06 %
Brady Statistic AS VS Percent: 0.49 %
Brady Statistic RA Percent Paced: 11.43 %
Brady Statistic RV Percent Paced: 99.39 %
Date Time Interrogation Session: 20220314125907
Implantable Lead Implant Date: 20160203
Implantable Lead Implant Date: 20160203
Implantable Lead Location: 753859
Implantable Lead Location: 753860
Implantable Lead Model: 5076
Implantable Lead Model: 5076
Implantable Pulse Generator Implant Date: 20160203
Lead Channel Impedance Value: 323 Ohm
Lead Channel Impedance Value: 437 Ohm
Lead Channel Impedance Value: 456 Ohm
Lead Channel Impedance Value: 475 Ohm
Lead Channel Pacing Threshold Amplitude: 0.625 V
Lead Channel Pacing Threshold Amplitude: 0.875 V
Lead Channel Pacing Threshold Pulse Width: 0.4 ms
Lead Channel Pacing Threshold Pulse Width: 0.4 ms
Lead Channel Sensing Intrinsic Amplitude: 2.625 mV
Lead Channel Sensing Intrinsic Amplitude: 2.625 mV
Lead Channel Sensing Intrinsic Amplitude: 7.375 mV
Lead Channel Sensing Intrinsic Amplitude: 7.375 mV
Lead Channel Setting Pacing Amplitude: 2 V
Lead Channel Setting Pacing Amplitude: 2.5 V
Lead Channel Setting Pacing Pulse Width: 0.4 ms
Lead Channel Setting Sensing Sensitivity: 2 mV

## 2020-04-14 NOTE — Progress Notes (Signed)
Remote pacemaker transmission.   

## 2020-04-16 ENCOUNTER — Encounter: Payer: Self-pay | Admitting: Nurse Practitioner

## 2020-04-16 ENCOUNTER — Ambulatory Visit (INDEPENDENT_AMBULATORY_CARE_PROVIDER_SITE_OTHER): Payer: HMO | Admitting: Nurse Practitioner

## 2020-04-16 VITALS — BP 114/64 | HR 80 | Ht 69.0 in | Wt 206.2 lb

## 2020-04-16 DIAGNOSIS — R140 Abdominal distension (gaseous): Secondary | ICD-10-CM | POA: Diagnosis not present

## 2020-04-16 DIAGNOSIS — E7889 Other lipoprotein metabolism disorders: Secondary | ICD-10-CM | POA: Diagnosis not present

## 2020-04-16 DIAGNOSIS — Z6830 Body mass index (BMI) 30.0-30.9, adult: Secondary | ICD-10-CM

## 2020-04-16 DIAGNOSIS — R109 Unspecified abdominal pain: Secondary | ICD-10-CM | POA: Diagnosis not present

## 2020-04-16 DIAGNOSIS — E8889 Other specified metabolic disorders: Secondary | ICD-10-CM

## 2020-04-16 DIAGNOSIS — E669 Obesity, unspecified: Secondary | ICD-10-CM

## 2020-04-16 NOTE — Progress Notes (Signed)
ASSESSMENT AND PLAN    # 70 year old female with chronic intermittent bloating and abdominal pain .  Celiac studies normal.  Pain improved with dicyclomine.   # Hepatic steatosis, normal liver enzymes.  Blood sugars controlled, significant improvement in most recent hemoglobin A1c to 7.  She has hypertriglyceridemia but is on fenofibrate .  She does not drink alcohol . We discussed need for weight loss. She has tried unsucessfully to lose weight but really wants to.  Low carbohyrate diet would be ideal but she is diabetic and this would require more frequent monitoring of her blood sugars and/or possibly medication adjustment.  --I am going to refer her to weight management program through [facility]  If insurance will not cover the cost then I have asked patient to talk with her primary care doctor about blood sugar management while following a low carb diet.   HISTORY OF PRESENT ILLNESS    Chief Complaint : follow up on bloating, abdominal pain  Stacey [name] is a 70 y.o. female known to Dr.Jacobs with a past medical history diverticular bleed, DM, chronic diastolic CHF, pacemaker, spigelian hernia.   February 2022 office visit  worsening of some of her chronic GI symptoms including crampy abdominal pain, bloating, excessive gas.  She also has a history of GERD but was doing well on pantoprazole.  TTG, IgA obtained and normal.  She was given a trial of dicyclomine  INTERVAL HISTORY: Here for follow up. Dicyclomine helped, has only had to use it twice since I saw her. It does make her sleepy.  Her bowel movements are normal. GERD symptoms are controlled on daily PPI.   PREVIOUS ENDOSCOPIC EVALUATIONS / PERTINENT STUDIES:    August 2017 CT abdomen and pelvis with contrast for evaluation of abdominal pain --Marked hepatic steatosis --RLQ abdominal wall hernia containing nonobstructed small bowel loops --Diverticulosis --1 cm right renal artery aneurysm  December 2017 CTA  abdomen and pelvis with contrast --Fatty liver --RLQ abdominal wall hernia containing small bowel loops without obstruction or strangulation --Diverticulosis  December 2018 colonoscopy for hematochezia --Pandiverticulosis, no bleeding diverticulum  March 2020 CT scan of the abdomen and pelvis with contrast for abdominal pain --Pandiverticulosis --Large RLQ spigelian hernia, unchanged --2 adjacent duodenal diverticula --Suspected steatosis --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 ANGIOEDEMA 03/07/2008   a. with ACE-I  . ANXIETY 09/16/2006  . ASTHMA 08/01/2006  . CAD (coronary artery disease)    a. 01/2010 : Minimal plaque at cardiac catheterization - CFX 20%, EF 70%;  b. 08/2012 Cath: Nl Cors, EF 65%.  . Chronic diastolic CHF (congestive heart failure) ([place]) 04/06/2010   a. In setting of HOCM.  Marland Kitchen Complete heart block ([place]) 02/26/2014   a. s/p MDT dual chamber pacemaker 02/2014  . COPD (chronic obstructive pulmonary disease) ([place])   . DEPRESSION 09/16/2006  . DIVERTICULOSIS, COLON, WITH HEMORRHAGE 04/06/2010  . DM (diabetes mellitus) in pregnancy, delivered w/postpartum condition 08/30/2010  . HYPERLIPIDEMIA 08/01/2006  . HYPERTENSION 08/01/2006  . 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 s/p septal myomectomy 4/12 at [place] with Dr. Evelina Dun;  b. echo 5/12: EF 60-65%, LVOT peak 18 mmHg; grade 1 diast dysfxn, mild SAM (improved since myomectomy;  c. 03/2012 Echo: EF 60-65%, mod-sev basal septal asymm hypertrophy, basal septal HK, LVOT grad 72mHg, Gr 1  DD, , SAM, Mild MR, nl RV, PASP 1mHg; 08/2012 Echo: technically difficult, doubt LVOT obstruction, EF 60%, Gr 1 DD, mod-sev dil LA.  . Impaired glucose tolerance 06/25/2010  . LBBB (left bundle branch block) 06/17/2010  . OBESITY 09/16/2006  . Renal artery  aneurysm ([place]   . SICKLE CELL ANEMIA 08/01/2006  . Type II or unspecified type diabetes mellitus without mention of complication, uncontrolled 08/30/2010  . VENTRAL HERNIA 12/07/2006    Current Medications, Allergies, Past Surgical History, Family History and Social History were reviewed in [employer].   Current Outpatient Medications  Medication Sig Dispense Refill  . acetaminophen (TYLENOL) 500 MG tablet Take 500 mg by mouth daily as needed.    .Marland Kitchenalbuterol (VENTOLIN HFA) 108 (90 Base) MCG/ACT inhaler Inhale 2 puffs into the lungs every 6 (six) hours as needed for wheezing or shortness of breath. 18 g 5  . amLODipine (NORVASC) 10 MG tablet TAKE 1 TABLET BY MOUTH DAILY 90 tablet 3  . atorvastatin (LIPITOR) 40 MG tablet TAKE 1 TABLET BY MOUTH DAILY. 90 tablet 3  . Blood Glucose Monitoring Suppl (ONE TOUCH ULTRA 2) W/DEVICE KIT Use to check blood sugars daily Dx E11.9 1 each 0  . cetirizine (ZYRTEC) 10 MG tablet Take 10 mg by mouth daily.    . clonazePAM (KLONOPIN) 0.5 MG tablet TAKE 1 TABLET BY MOUTH TWICE DAILY AS NEEDED FOR ANXIETY 30 tablet 2  . dicyclomine (BENTYL) 20 MG tablet Take 1 tablet (20 mg total) by mouth 3 (three) times daily between meals as needed for spasms. 60 tablet 3  . Dulaglutide (TRULICITY) 1.5 MMW/4.1LKSOPN Inject 1.5 mg into the skin once a week. 6 mL 3  . fenofibrate (TRICOR) 145 MG tablet TAKE 1 TABLET BY MOUTH DAILY 90 tablet 3  . glipiZIDE (GLUCOTROL XL) 10 MG 24 hr tablet TAKE 1 TABLET BY MOUTH EVERY DAY WITH BREAKFAST 90 tablet 2  . glucose blood test strip 1 each by Other route daily. Use to check blood sugar daily Dx e11.9 100 each 11  . hydrochlorothiazide (MICROZIDE) 12.5 MG capsule TAKE 1 CAPSULE BY MOUTH DAILY 90 capsule 1  . hydrocortisone (ANUSOL-HC) 2.5 % rectal cream Place 1 application rectally as needed for hemorrhoids or anal itching.    . latanoprost (XALATAN) 0.005 % ophthalmic solution Place 1 drop into both eyes at  bedtime.  3  . losartan (COZAAR) 100 MG tablet TAKE 1 TABLET BY MOUTH DAILY 90 tablet 1  . metoprolol succinate (TOPROL-XL) 100 MG 24 hr tablet TAKE 1 TABLET(100 MG) BY MOUTH DAILY WITH OR IMMEDIATELY FOLLOWING A MEAL 90 tablet 2  . ondansetron (ZOFRAN ODT) 4 MG disintegrating tablet Take 1 tablet (4 mg total) by mouth every 8 (eight) hours as needed for nausea. 10 tablet 0  . ONETOUCH DELICA LANCETS 344WMISC USE TO HELP CHECK BLOOD SUGAR DAILY 200 each 0  . oxcarbazepine (TRILEPTAL) 600 MG tablet TAKE 1 TABLET(600 MG) BY MOUTH TWICE DAILY 180 tablet 1  . pantoprazole (PROTONIX) 40 MG tablet TAKE 1 TABLET BY MOUTH EVERY DAY 90 tablet 3  . pioglitazone (ACTOS) 45 MG tablet TAKE 1 TABLET BY MOUTH DAILY 90 tablet 2  . potassium chloride (KLOR-CON) 10 MEQ tablet TAKE 2 TABLETS BY MOUTH DAILY 180 tablet 2  . prochlorperazine (COMPAZINE) 10 MG tablet Take 1 tablet (10 mg total) by mouth every 6 (six) hours as needed for nausea or vomiting. 30 tablet 1  . topiramate (TOPAMAX) 100 MG  tablet TAKE 1 TABLET(100 MG) BY MOUTH DAILY 90 tablet 1   No current facility-administered medications for this visit.    Review of Systems: No chest pain. No shortness of breath. No urinary complaints.   PHYSICAL EXAM :    Wt Readings from Last 3 Encounters:  03/19/20 206 lb 12.8 oz (93.8 kg)  03/19/20 206 lb 12.8 oz (93.8 kg)  11/05/19 198 lb (89.8 kg)    BP 114/64   Pulse 80   Ht _0  (1.753 m)   Wt 206 lb 3.2 oz (93.5 kg)   SpO2 97%   BMI 30.45 kg/m  Constitutional:  Pleasant female in no acute distress. Psychiatric: Normal mood and affect. Behavior is normal. EENT: Pupils normal.  Conjunctivae are normal. No scleral icterus. Neck supple.  Cardiovascular: Normal rate, regular rhythm. No edema Pulmonary/chest: Effort normal and breath sounds normal. No wheezing, rales or rhonchi. Abdominal: Soft, protuberant,  nontender. Bowel sounds active throughout. There are no masses palpable.  Neurological:  Alert and oriented to person place and time. Skin: Skin is warm and dry. No rashes noted.  Tye Savoy, NP  04/16/2020, 8:24 AM

## 2020-04-16 NOTE — Patient Instructions (Addendum)
If you are age 69 or older, your body mass index should be between 23-30. Your Body mass index is 30.45 kg/m. If this is out of the aforementioned range listed, please consider follow up with your Primary Care Provider.  If you are age 38 or younger, your body mass index should be between 19-25. Your Body mass index is 30.45 kg/m. If this is out of the aformentioned range listed, please consider follow up with your Primary Care Provider.   We have sent a referral to Weight Management. Please allow 2 weeks for them to contact you.  Follow up as needed.  Thank you for entrusting me with your care and choosing [facility].  Willette Cluster, NP-C

## 2020-04-26 ENCOUNTER — Other Ambulatory Visit: Payer: Self-pay | Admitting: Internal Medicine

## 2020-04-26 NOTE — Telephone Encounter (Signed)
Please refill as per office routine med refill policy (all routine meds refilled for 3 mo or monthly per pt preference up to one year from last visit, then month to month grace period for 3 mo, then further med refills will have to be denied)  

## 2020-04-27 ENCOUNTER — Other Ambulatory Visit: Payer: Self-pay | Admitting: Internal Medicine

## 2020-04-28 ENCOUNTER — Other Ambulatory Visit: Payer: Self-pay | Admitting: Internal Medicine

## 2020-04-28 NOTE — Telephone Encounter (Signed)
Please refill as per office routine med refill policy (all routine meds refilled for 3 mo or monthly per pt preference up to one year from last visit, then month to month grace period for 3 mo, then further med refills will have to be denied)  

## 2020-05-05 ENCOUNTER — Encounter: Payer: Self-pay | Admitting: Internal Medicine

## 2020-05-05 ENCOUNTER — Ambulatory Visit (INDEPENDENT_AMBULATORY_CARE_PROVIDER_SITE_OTHER): Payer: HMO | Admitting: Internal Medicine

## 2020-05-05 ENCOUNTER — Other Ambulatory Visit: Payer: Self-pay

## 2020-05-05 VITALS — BP 118/70 | HR 78 | Temp 98.5°F | Ht 69.0 in | Wt 204.8 lb

## 2020-05-05 DIAGNOSIS — E559 Vitamin D deficiency, unspecified: Secondary | ICD-10-CM | POA: Diagnosis not present

## 2020-05-05 DIAGNOSIS — E538 Deficiency of other specified B group vitamins: Secondary | ICD-10-CM | POA: Diagnosis not present

## 2020-05-05 DIAGNOSIS — N1831 Chronic kidney disease, stage 3a: Secondary | ICD-10-CM

## 2020-05-05 DIAGNOSIS — Z23 Encounter for immunization: Secondary | ICD-10-CM

## 2020-05-05 DIAGNOSIS — E1165 Type 2 diabetes mellitus with hyperglycemia: Secondary | ICD-10-CM

## 2020-05-05 DIAGNOSIS — Z0000 Encounter for general adult medical examination without abnormal findings: Secondary | ICD-10-CM | POA: Diagnosis not present

## 2020-05-05 DIAGNOSIS — E7800 Pure hypercholesterolemia, unspecified: Secondary | ICD-10-CM

## 2020-05-05 LAB — MICROALBUMIN / CREATININE URINE RATIO
Creatinine,U: 69.1 mg/dL
Microalb Creat Ratio: 2.1 mg/g (ref 0.0–30.0)
Microalb, Ur: 1.5 mg/dL (ref 0.0–1.9)

## 2020-05-05 LAB — CBC WITH DIFFERENTIAL/PLATELET
Basophils Absolute: 0 10*3/uL (ref 0.0–0.1)
Basophils Relative: 0.7 % (ref 0.0–3.0)
Eosinophils Absolute: 0.1 10*3/uL (ref 0.0–0.7)
Eosinophils Relative: 1.9 % (ref 0.0–5.0)
HCT: 37.3 % (ref 36.0–46.0)
Hemoglobin: 12 g/dL (ref 12.0–15.0)
Lymphocytes Relative: 27.8 % (ref 12.0–46.0)
Lymphs Abs: 1.5 10*3/uL (ref 0.7–4.0)
MCHC: 32 g/dL (ref 30.0–36.0)
MCV: 77.7 fl — ABNORMAL LOW (ref 78.0–100.0)
Monocytes Absolute: 0.3 10*3/uL (ref 0.1–1.0)
Monocytes Relative: 6.3 % (ref 3.0–12.0)
Neutro Abs: 3.5 10*3/uL (ref 1.4–7.7)
Neutrophils Relative %: 63.3 % (ref 43.0–77.0)
Platelets: 223 10*3/uL (ref 150.0–400.0)
RBC: 4.8 Mil/uL (ref 3.87–5.11)
RDW: 16.6 % — ABNORMAL HIGH (ref 11.5–15.5)
WBC: 5.5 10*3/uL (ref 4.0–10.5)

## 2020-05-05 LAB — LIPID PANEL
Cholesterol: 175 mg/dL (ref 0–200)
HDL: 59 mg/dL (ref >39.00)
LDL Cholesterol: 83 mg/dL (ref 0–99)
NonHDL: 116.07
Total CHOL/HDL Ratio: 3
Triglycerides: 165 mg/dL — ABNORMAL HIGH (ref 0.0–149.0)
VLDL: 33 mg/dL (ref 0.0–40.0)

## 2020-05-05 LAB — HEPATIC FUNCTION PANEL
ALT: 16 U/L (ref 0–35)
AST: 19 U/L (ref 0–37)
Albumin: 4.3 g/dL (ref 3.5–5.2)
Alkaline Phosphatase: 77 U/L (ref 39–117)
Bilirubin, Direct: 0 mg/dL (ref 0.0–0.3)
Total Bilirubin: 0.3 mg/dL (ref 0.2–1.2)
Total Protein: 7.7 g/dL (ref 6.0–8.3)

## 2020-05-05 LAB — HEMOGLOBIN A1C: Hgb A1c MFr Bld: 6.4 % (ref 4.6–6.5)

## 2020-05-05 LAB — URINALYSIS, ROUTINE W REFLEX MICROSCOPIC
Bilirubin Urine: NEGATIVE
Hgb urine dipstick: NEGATIVE
Ketones, ur: NEGATIVE
Leukocytes,Ua: NEGATIVE
Nitrite: NEGATIVE
RBC / HPF: NONE SEEN (ref 0–?)
Specific Gravity, Urine: 1.015 (ref 1.000–1.030)
Total Protein, Urine: NEGATIVE
Urine Glucose: NEGATIVE
Urobilinogen, UA: 0.2 (ref 0.0–1.0)
pH: 5.5 (ref 5.0–8.0)

## 2020-05-05 LAB — VITAMIN D 25 HYDROXY (VIT D DEFICIENCY, FRACTURES): VITD: 30.42 ng/mL (ref 30.00–100.00)

## 2020-05-05 LAB — BASIC METABOLIC PANEL
BUN: 21 mg/dL (ref 6–23)
CO2: 26 mEq/L (ref 19–32)
Calcium: 10.1 mg/dL (ref 8.4–10.5)
Chloride: 107 mEq/L (ref 96–112)
Creatinine, Ser: 1.14 mg/dL (ref 0.40–1.20)
GFR: 49.38 mL/min — ABNORMAL LOW (ref >60.00)
Glucose, Bld: 59 mg/dL — ABNORMAL LOW (ref 70–99)
Potassium: 4.5 mEq/L (ref 3.5–5.1)
Sodium: 143 mEq/L (ref 135–145)

## 2020-05-05 LAB — TSH: TSH: 0.93 u[IU]/mL (ref 0.35–4.50)

## 2020-05-05 LAB — PHOSPHORUS: Phosphorus: 3 mg/dL (ref 2.3–4.6)

## 2020-05-05 LAB — VITAMIN B12: Vitamin B-12: 587 pg/mL (ref 211–911)

## 2020-05-05 NOTE — Progress Notes (Signed)
Patient ID: Stacey [name], female   DOB: [birth year], 70 y.o.   MRN: [id number]         Chief Complaint:: wellness exam and Follow-up  DM, depression, low vit d, ckd, hld       HPI:  Stacey [name] is a 70 y.o. female here for wellness exam; due for pneuomvax and plans to have the second booster covid soon, o/w up to date with preventive referrals and immunizations                        Also plans to f/u with vascular regardaing renal artery aneurysym.  Denies worsening depressive symptoms, suicidal ideation, or panic; has ongoing anxiety, maybe more recently due to husband died 2018-06-27(non covid - cancer) but annivesary bothers her.  So she bakes a lot and the family is very happy she gives them all. Not taking Vit D.  Pt denies chest pain, increased sob or doe, wheezing, orthopnea, PND, increased LE swelling, palpitations, dizziness or syncope.  .Denies new focal neuro s/s.  Trying to follow lower chol diet by not eating her baked goods.   Pt denies fever, wt loss, night sweats, loss of appetite, or other constitutional symptoms  No other new complaints   Wt Readings from Last 3 Encounters:  05/05/20 204 lb 12.8 oz (92.9 kg)  04/16/20 206 lb 3.2 oz (93.5 kg)  03/19/20 206 lb 12.8 oz (93.8 kg)   BP Readings from Last 3 Encounters:  05/05/20 118/70  04/16/20 114/64  03/19/20 138/70   Immunization History  Administered Date(s) Administered  . Influenza Whole 11/10/2008, 10/13/2009  . Influenza, High Dose Seasonal PF 10/26/2016, 10/27/2017  . Influenza,inj,Quad PF,6+ Mos 10/23/2013  . Influenza-Unspecified 12/04/2014, 11/05/2019  . PFIZER(Purple Top)SARS-COV-2 Vaccination 04/29/2019, 05/20/2019, 11/19/2019  . Pneumococcal Conjugate-13 01/10/2017  . Pneumococcal Polysaccharide-23 09/29/2005, 08/30/2010, 01/28/2014, 05/05/2020  . Td 01/24/1993, 12/10/2008  . Tdap 04/14/2020   There are no preventive care reminders to display for this patient.    Past Medical  History:  Diagnosis Date  . ANGIOEDEMA 03/07/2008   a. with ACE-I  . ANXIETY 09/16/2006  . ASTHMA 08/01/2006  . CAD (coronary artery disease)    a. 01/2010 : Minimal plaque at cardiac catheterization - CFX 20%, EF 70%;  b. 08/2012 Cath: Nl Cors, EF 65%.  . Chronic diastolic CHF (congestive heart failure) ([place]) 04/06/2010   a. In setting of HOCM.  Marland Kitchen Complete heart block ([place]) 02/26/2014   a. s/p MDT dual chamber pacemaker 02/2014  . COPD (chronic obstructive pulmonary disease) ([place])   . DEPRESSION 09/16/2006  . DIVERTICULOSIS, COLON, WITH HEMORRHAGE 04/06/2010  . DM (diabetes mellitus) in pregnancy, delivered w/postpartum condition 08/30/2010  . HYPERLIPIDEMIA 08/01/2006  . HYPERTENSION 08/01/2006  . 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 s/p septal myomectomy 4/12 at [place] with Dr. Evelina Dun;  b. echo 5/12: EF 60-65%, LVOT peak 18 mmHg; grade 1 diast dysfxn, mild SAM (improved since myomectomy;  c. 03/2012 Echo: EF 60-65%, mod-sev basal septal asymm hypertrophy, basal septal HK, LVOT grad 21mmHg, Gr 1 DD, , SAM, Mild MR, nl RV, PASP 52mmHg; 08/2012 Echo: technically difficult, doubt LVOT obstruction, EF 60%, Gr 1 DD, mod-sev dil LA.  . Impaired glucose tolerance 06/25/2010  . LBBB (left bundle branch block) 06/17/2010  . OBESITY 09/16/2006  . Renal artery aneurysm ([place])   . SICKLE CELL ANEMIA 08/01/2006  . Type II or unspecified type diabetes mellitus without mention of complication, uncontrolled  08/30/2010  . VENTRAL HERNIA 12/07/2006   Past Surgical History:  Procedure Laterality Date  . ABDOMINAL HYSTERECTOMY  1987  . CARDIAC SURGERY    . COLONOSCOPY N/A 12/26/2016   Procedure: COLONOSCOPY;  Surgeon: Milus Banister, MD;  Location: [facility] ENDOSCOPY;  Service: Endoscopy;  Laterality: N/A;  . ELECTROPHYSIOLOGY STUDY N/A 09/12/2012   Procedure: ELECTROPHYSIOLOGY STUDY;  Surgeon: Deboraha Sprang, MD;  Location: [facility] CATH LAB;  Service: Cardiovascular;  Laterality: N/A;  . LEFT HEART CATH  Aug. 18, 2014   Medtronic heart  device  . LEFT HEART CATHETERIZATION WITH CORONARY ANGIOGRAM N/A 09/10/2012   Procedure: LEFT HEART CATHETERIZATION WITH CORONARY ANGIOGRAM;  Surgeon: Burnell Blanks, MD;  Location: [facility] CATH LAB;  Service: Cardiovascular;  Laterality: N/A;  . MYOMECTOMY     Septal  . PERMANENT PACEMAKER INSERTION N/A 02/26/2014   MDT Advisa dual chamber pacemaker implanted by Dr Caryl Comes for heart block  . VENTRAL HERNIA REPAIR  06/2006    reports that she quit smoking about 22 years ago. Her smoking use included cigarettes. She has a 5.00 pack-year smoking history. She has never used smokeless tobacco. She reports that she does not drink alcohol and does not use drugs. family history includes Clotting disorder in her daughter and another family member; Colon cancer in her daughter; Diabetes in her paternal grandmother; Heart attack in her father and sister; Heart disease in her daughter and father; Hyperlipidemia in her brother, daughter, and sister; Hypertension in her brother, father, sister, and son; Stroke in her sister. She was adopted. Allergies  Allergen Reactions  . Dairy Aid [Lactase] Diarrhea and Other (See Comments)    Bloating and gastric distress  . Metformin And Related Other (See Comments)    GI upset  . Ace Inhibitors Other (See Comments)    REACTION: angioedema right eye  . Aspirin Other (See Comments)    Bleeding   . Codeine Other (See Comments)    Hallucinations, can take if with someone.  . Soy Allergy Other (See Comments)    Bleeding & cramps, diarrhea    Current Outpatient Medications on File Prior to Visit  Medication Sig Dispense Refill  . acetaminophen (TYLENOL) 500 MG tablet Take 500 mg by mouth daily as needed.    Marland Kitchen albuterol (VENTOLIN HFA) 108 (90 Base) MCG/ACT inhaler Inhale 2 puffs into the lungs every 6 (six) hours as needed for wheezing or shortness of breath. 18 g 5  . amLODipine (NORVASC) 10 MG tablet TAKE 1 TABLET BY MOUTH DAILY 90 tablet 3  . atorvastatin (LIPITOR)  40 MG tablet TAKE 1 TABLET BY MOUTH DAILY. 90 tablet 3  . Blood Glucose Monitoring Suppl (ONE TOUCH ULTRA 2) W/DEVICE KIT Use to check blood sugars daily Dx E11.9 1 each 0  . cetirizine (ZYRTEC) 10 MG tablet Take 10 mg by mouth daily.    . clonazePAM (KLONOPIN) 0.5 MG tablet TAKE 1 TABLET BY MOUTH TWICE DAILY AS NEEDED FOR ANXIETY 30 tablet 2  . dicyclomine (BENTYL) 20 MG tablet Take 1 tablet (20 mg total) by mouth 3 (three) times daily between meals as needed for spasms. 60 tablet 3  . Dulaglutide (TRULICITY) 1.5 ZO/1.0RU SOPN Inject 1.5 mg into the skin once a week. 6 mL 3  . fenofibrate (TRICOR) 145 MG tablet TAKE 1 TABLET BY MOUTH DAILY 90 tablet 3  . glipiZIDE (GLUCOTROL XL) 10 MG 24 hr tablet TAKE 1 TABLET BY MOUTH EVERY DAY WITH BREAKFAST 90 tablet 2  . glucose  blood test strip 1 each by Other route daily. Use to check blood sugar daily Dx e11.9 100 each 11  . hydrochlorothiazide (MICROZIDE) 12.5 MG capsule TAKE 1 CAPSULE BY MOUTH DAILY 90 capsule 1  . hydrocortisone (ANUSOL-HC) 2.5 % rectal cream Place 1 application rectally as needed for hemorrhoids or anal itching.    . losartan (COZAAR) 100 MG tablet TAKE 1 TABLET BY MOUTH DAILY 90 tablet 1  . metoprolol succinate (TOPROL-XL) 100 MG 24 hr tablet TAKE 1 TABLET(100 MG) BY MOUTH DAILY WITH OR IMMEDIATELY FOLLOWING A MEAL 30 tablet 0  . ondansetron (ZOFRAN ODT) 4 MG disintegrating tablet Take 1 tablet (4 mg total) by mouth every 8 (eight) hours as needed for nausea. 10 tablet 0  . ONETOUCH DELICA LANCETS 03J MISC USE TO HELP CHECK BLOOD SUGAR DAILY 200 each 0  . oxcarbazepine (TRILEPTAL) 600 MG tablet TAKE 1 TABLET(600 MG) BY MOUTH TWICE DAILY 180 tablet 1  . pantoprazole (PROTONIX) 40 MG tablet TAKE 1 TABLET BY MOUTH EVERY DAY 90 tablet 3  . pioglitazone (ACTOS) 45 MG tablet TAKE 1 TABLET BY MOUTH DAILY 30 tablet 0  . potassium chloride (KLOR-CON) 10 MEQ tablet TAKE 2 TABLETS BY MOUTH DAILY 180 tablet 1  . prochlorperazine (COMPAZINE) 10  MG tablet Take 1 tablet (10 mg total) by mouth every 6 (six) hours as needed for nausea or vomiting. 30 tablet 1  . topiramate (TOPAMAX) 100 MG tablet TAKE 1 TABLET(100 MG) BY MOUTH DAILY 90 tablet 1   No current facility-administered medications on file prior to visit.        ROS:  All others reviewed and negative.  Objective        PE:  BP 118/70 (BP Location: Left Arm, Patient Position: Sitting, Cuff Size: Large)   Pulse 78   Temp 98.5 F (36.9 C) (Oral)   Ht $R'5\' 9"'Lx$  (1.753 m)   Wt 204 lb 12.8 oz (92.9 kg)   SpO2 98%   BMI 30.24 kg/m                 Constitutional: Pt appears in NAD               HENT: Head: NCAT.                Right Ear: External ear normal.                 Left Ear: External ear normal.                Eyes: . Pupils are equal, round, and reactive to light. Conjunctivae and EOM are normal               Nose: without d/c or deformity               Neck: Neck supple. Gross normal ROM               Cardiovascular: Normal rate and regular rhythm.                 Pulmonary/Chest: Effort normal and breath sounds without rales or wheezing.                Abd:  Soft, NT, ND, + BS, no organomegaly               Neurological: Pt is alert. At baseline orientation, motor grossly intact               Skin:  Skin is warm. No rashes, no other new lesions, LE edema - none               Psychiatric: Pt behavior is normal without agitation   Micro: none  Cardiac tracings I have personally interpreted today:  none  Pertinent Radiological findings (summarize): none   Lab Results  Component Value Date   WBC 5.5 05/05/2020   HGB 12.0 05/05/2020   HCT 37.3 05/05/2020   PLT 223.0 05/05/2020   GLUCOSE 59 (L) 05/05/2020   CHOL 175 05/05/2020   TRIG 165.0 (H) 05/05/2020   HDL 59.00 05/05/2020   LDLDIRECT 98.0 08/10/2018   LDLCALC 83 05/05/2020   ALT 16 05/05/2020   AST 19 05/05/2020   NA 143 05/05/2020   K 4.5 05/05/2020   CL 107 05/05/2020   CREATININE 1.14 05/05/2020    BUN 21 05/05/2020   CO2 26 05/05/2020   TSH 0.93 05/05/2020   INR 1.35 12/23/2016   HGBA1C 6.4 05/05/2020   MICROALBUR 1.5 05/05/2020   Assessment/Plan:  Stacey [name] is a 70 y.o. Black or African American [2] female with  has a past medical history of ANGIOEDEMA (03/07/2008), ANXIETY (09/16/2006), ASTHMA (08/01/2006), CAD (coronary artery disease), Chronic diastolic CHF (congestive heart failure) ([place]) (04/06/2010), Complete heart block ([place]) (02/26/2014), COPD (chronic obstructive pulmonary disease) ([place]), DEPRESSION (09/16/2006), DIVERTICULOSIS, COLON, WITH HEMORRHAGE (04/06/2010), DM (diabetes mellitus) in pregnancy, delivered w/postpartum condition (08/30/2010), HYPERLIPIDEMIA (08/01/2006), HYPERTENSION (08/01/2006), Hypertr obst cardiomyop (08/01/2006), Impaired glucose tolerance (06/25/2010), LBBB (left bundle branch block) (06/17/2010), OBESITY (09/16/2006), Renal artery aneurysm ([place]), SICKLE CELL ANEMIA (08/01/2006), Type II or unspecified type diabetes mellitus without mention of complication, uncontrolled (08/30/2010), and VENTRAL HERNIA (12/07/2006).  Preventative health care Age and sex appropriate education and counseling updated with regular exercise and diet Referrals for preventative services - for pneumovax Immunizations addressed - none needed Smoking counseling  - none needed Evidence for depression or other mood disorder - none significant Most recent labs reviewed. I have personally reviewed and have noted: 1) the patient's medical and social history 2) The patient's current medications and supplements 3) The patient's height, weight, and BMI have been recorded in the chart   Stage 3a chronic kidney disease ([place]) Lab Results  Component Value Date   CREATININE 1.14 05/05/2020   Stable overall, cont to avoid nephrotoxins   Diabetes ([place]) Lab Results  Component Value Date   HGBA1C 6.4 05/05/2020   Stable, pt to continue current medical treatment  - trulicity,  glucotrol, actos,   Vitamin D deficiency Last vitamin D Lab Results  Component Value Date   VD25OH 30.42 05/05/2020   Low normal, to start oral replacement   Hyperlipidemia Lab Results  Component Value Date   LDLCALC 83 05/05/2020   Stable, pt to continue current statin lipitor 40   Followup: Return in about 6 months (around 11/04/2020).  Cathlean Cower, MD 05/10/2020 5:51 AM [facility]

## 2020-05-05 NOTE — Patient Instructions (Addendum)
You had the Pneumovax today  Please continue all other medications as before, and refills have been done if requested.  Please have the pharmacy call with any other refills you may need.  Please continue your efforts at being more active, low cholesterol diet, and weight control.  You are otherwise up to date with prevention measures today.  Please keep your appointments with your specialists as you may have planned  Please go to the LAB at the blood drawing area for the tests to be done  You will be contacted by phone if any changes need to be made immediately.  Otherwise, you will receive a letter about your results with an explanation, but please check with MyChart first.  Please remember to sign up for MyChart if you have not done so, as this will be important to you in the future with finding out test results, communicating by private email, and scheduling acute appointments online when needed.  Please make an Appointment to return in 6 months, or sooner if needed

## 2020-05-07 LAB — PTH, INTACT AND CALCIUM
Calcium: 10 mg/dL (ref 8.6–10.4)
PTH: 52 pg/mL (ref 16–77)

## 2020-05-10 ENCOUNTER — Encounter: Payer: Self-pay | Admitting: Internal Medicine

## 2020-05-10 DIAGNOSIS — E559 Vitamin D deficiency, unspecified: Secondary | ICD-10-CM | POA: Insufficient documentation

## 2020-05-10 NOTE — Assessment & Plan Note (Signed)
Lab Results  Component Value Date   HGBA1C 6.4 05/05/2020   Stable, pt to continue current medical treatment  - trulicity, glucotrol, actos,

## 2020-05-10 NOTE — Assessment & Plan Note (Signed)
Age and sex appropriate education and counseling updated with regular exercise and diet Referrals for preventative services - for pneumovax Immunizations addressed - none needed Smoking counseling  - none needed Evidence for depression or other mood disorder - none significant Most recent labs reviewed. I have personally reviewed and have noted: 1) the patient's medical and social history 2) The patient's current medications and supplements 3) The patient's height, weight, and BMI have been recorded in the chart

## 2020-05-10 NOTE — Assessment & Plan Note (Signed)
Lab Results  Component Value Date   CREATININE 1.14 05/05/2020   Stable overall, cont to avoid nephrotoxins

## 2020-05-10 NOTE — Assessment & Plan Note (Signed)
Lab Results  Component Value Date   LDLCALC 83 05/05/2020   Stable, pt to continue current statin lipitor 40

## 2020-05-10 NOTE — Assessment & Plan Note (Signed)
Last vitamin D Lab Results  Component Value Date   VD25OH 30.42 05/05/2020   Low normal, to start oral replacement

## 2020-06-02 ENCOUNTER — Other Ambulatory Visit: Payer: Self-pay | Admitting: Internal Medicine

## 2020-06-02 NOTE — Telephone Encounter (Signed)
Please refill as per office routine med refill policy (all routine meds refilled for 3 mo or monthly per pt preference up to one year from last visit, then month to month grace period for 3 mo, then further med refills will have to be denied)  

## 2020-06-04 ENCOUNTER — Other Ambulatory Visit: Payer: Self-pay | Admitting: Internal Medicine

## 2020-06-04 NOTE — Telephone Encounter (Signed)
Please refill as per office routine med refill policy (all routine meds refilled for 3 mo or monthly per pt preference up to one year from last visit, then month to month grace period for 3 mo, then further med refills will have to be denied)  

## 2020-07-06 ENCOUNTER — Ambulatory Visit (INDEPENDENT_AMBULATORY_CARE_PROVIDER_SITE_OTHER): Payer: HMO

## 2020-07-06 DIAGNOSIS — I442 Atrioventricular block, complete: Secondary | ICD-10-CM | POA: Diagnosis not present

## 2020-07-09 LAB — CUP PACEART REMOTE DEVICE CHECK
Battery Remaining Longevity: 45 mo
Battery Voltage: 2.98 V
Brady Statistic AP VP Percent: 17.17 %
Brady Statistic AP VS Percent: 0 %
Brady Statistic AS VP Percent: 82.78 %
Brady Statistic AS VS Percent: 0.06 %
Brady Statistic RA Percent Paced: 17.15 %
Brady Statistic RV Percent Paced: 99.89 %
Date Time Interrogation Session: 20220613091355
Implantable Lead Implant Date: 20160203
Implantable Lead Implant Date: 20160203
Implantable Lead Location: 753859
Implantable Lead Location: 753860
Implantable Lead Model: 5076
Implantable Lead Model: 5076
Implantable Pulse Generator Implant Date: 20160203
Lead Channel Impedance Value: 323 Ohm
Lead Channel Impedance Value: 456 Ohm
Lead Channel Impedance Value: 475 Ohm
Lead Channel Impedance Value: 513 Ohm
Lead Channel Pacing Threshold Amplitude: 0.625 V
Lead Channel Pacing Threshold Amplitude: 1 V
Lead Channel Pacing Threshold Pulse Width: 0.4 ms
Lead Channel Pacing Threshold Pulse Width: 0.4 ms
Lead Channel Sensing Intrinsic Amplitude: 2.375 mV
Lead Channel Sensing Intrinsic Amplitude: 2.375 mV
Lead Channel Sensing Intrinsic Amplitude: 7.625 mV
Lead Channel Sensing Intrinsic Amplitude: 7.625 mV
Lead Channel Setting Pacing Amplitude: 2 V
Lead Channel Setting Pacing Amplitude: 2.5 V
Lead Channel Setting Pacing Pulse Width: 0.4 ms
Lead Channel Setting Sensing Sensitivity: 2 mV

## 2020-07-10 ENCOUNTER — Other Ambulatory Visit: Payer: Self-pay | Admitting: Internal Medicine

## 2020-07-10 NOTE — Telephone Encounter (Signed)
Please refill as per office routine med refill policy (all routine meds refilled for 3 mo or monthly per pt preference up to one year from last visit, then month to month grace period for 3 mo, then further med refills will have to be denied)  

## 2020-07-13 ENCOUNTER — Other Ambulatory Visit: Payer: Self-pay | Admitting: Internal Medicine

## 2020-07-13 NOTE — Telephone Encounter (Signed)
Please refill as per office routine med refill policy (all routine meds refilled for 3 mo or monthly per pt preference up to one year from last visit, then month to month grace period for 3 mo, then further med refills will have to be denied)  

## 2020-07-15 ENCOUNTER — Other Ambulatory Visit: Payer: Self-pay | Admitting: Internal Medicine

## 2020-07-15 NOTE — Telephone Encounter (Signed)
Please refill as per office routine med refill policy (all routine meds refilled for 3 mo or monthly per pt preference up to one year from last visit, then month to month grace period for 3 mo, then further med refills will have to be denied)  

## 2020-07-27 ENCOUNTER — Other Ambulatory Visit: Payer: Self-pay | Admitting: Internal Medicine

## 2020-07-27 NOTE — Telephone Encounter (Signed)
Please refill as per office routine med refill policy (all routine meds refilled for 3 mo or monthly per pt preference up to one year from last visit, then month to month grace period for 3 mo, then further med refills will have to be denied)  

## 2020-07-28 NOTE — Progress Notes (Signed)
Remote pacemaker transmission.   

## 2020-07-30 NOTE — Telephone Encounter (Signed)
Upon review of chart, pt last OV 05/05/20, MD wrote for return OV in 74mo; no appt noted.  Pt called & appt made for 11/04/20.

## 2020-08-07 ENCOUNTER — Other Ambulatory Visit: Payer: Self-pay | Admitting: Internal Medicine

## 2020-08-07 ENCOUNTER — Other Ambulatory Visit: Payer: Self-pay

## 2020-08-07 MED ORDER — HYDROCHLOROTHIAZIDE 12.5 MG PO CAPS: 12.5000 mg | ORAL_CAPSULE | Freq: Every day | ORAL | 1 refills | Status: DC

## 2020-09-01 ENCOUNTER — Other Ambulatory Visit: Payer: Self-pay | Admitting: Internal Medicine

## 2020-09-01 NOTE — Telephone Encounter (Signed)
Please refill as per office routine med refill policy (all routine meds refilled for 3 mo or monthly per pt preference up to one year from last visit, then month to month grace period for 3 mo, then further med refills will have to be denied)  

## 2020-09-29 ENCOUNTER — Telehealth: Payer: Self-pay

## 2020-09-29 NOTE — Telephone Encounter (Signed)
Pt requesting a new prescription be written for Trulicity 1.5. It is on the patient med list.   Please new rx request via fax to [phone] ([facility])  Or to call [phone]

## 2020-10-01 ENCOUNTER — Other Ambulatory Visit: Payer: Self-pay | Admitting: Internal Medicine

## 2020-10-01 NOTE — Telephone Encounter (Signed)
Please refill as per office routine med refill policy (all routine meds to be refilled for 3 mo or monthly (per pt preference) up to one year from last visit, then month to month grace period for 3 mo, then further med refills will have to be denied) ? ?

## 2020-10-02 MED ORDER — TRULICITY 1.5 MG/0.5ML ~~LOC~~ SOAJ: 1.5000 mg | SUBCUTANEOUS | 3 refills | Status: DC

## 2020-10-02 NOTE — Telephone Encounter (Signed)
Hardcopy has been faxed

## 2020-10-02 NOTE — Telephone Encounter (Signed)
Done hardcopy to [facility]

## 2020-10-05 ENCOUNTER — Ambulatory Visit (INDEPENDENT_AMBULATORY_CARE_PROVIDER_SITE_OTHER): Payer: HMO

## 2020-10-05 ENCOUNTER — Other Ambulatory Visit: Payer: Self-pay | Admitting: Internal Medicine

## 2020-10-05 DIAGNOSIS — I495 Sick sinus syndrome: Secondary | ICD-10-CM

## 2020-10-05 DIAGNOSIS — I442 Atrioventricular block, complete: Secondary | ICD-10-CM

## 2020-10-05 MED ORDER — TRULICITY 1.5 MG/0.5ML ~~LOC~~ SOAJ: 1.5000 mg | SUBCUTANEOUS | 3 refills | Status: DC

## 2020-10-06 LAB — CUP PACEART REMOTE DEVICE CHECK
Battery Remaining Longevity: 37 mo
Battery Voltage: 2.97 V
Brady Statistic AP VP Percent: 15.27 %
Brady Statistic AP VS Percent: 0 %
Brady Statistic AS VP Percent: 84.67 %
Brady Statistic AS VS Percent: 0.06 %
Brady Statistic RA Percent Paced: 15.25 %
Brady Statistic RV Percent Paced: 99.84 %
Date Time Interrogation Session: 20220912094230
Implantable Lead Implant Date: 20160203
Implantable Lead Implant Date: 20160203
Implantable Lead Location: 753859
Implantable Lead Location: 753860
Implantable Lead Model: 5076
Implantable Lead Model: 5076
Implantable Pulse Generator Implant Date: 20160203
Lead Channel Impedance Value: 323 Ohm
Lead Channel Impedance Value: 437 Ohm
Lead Channel Impedance Value: 475 Ohm
Lead Channel Impedance Value: 494 Ohm
Lead Channel Pacing Threshold Amplitude: 0.625 V
Lead Channel Pacing Threshold Amplitude: 1 V
Lead Channel Pacing Threshold Pulse Width: 0.4 ms
Lead Channel Pacing Threshold Pulse Width: 0.4 ms
Lead Channel Sensing Intrinsic Amplitude: 10.75 mV
Lead Channel Sensing Intrinsic Amplitude: 10.75 mV
Lead Channel Sensing Intrinsic Amplitude: 2.75 mV
Lead Channel Sensing Intrinsic Amplitude: 2.75 mV
Lead Channel Setting Pacing Amplitude: 2 V
Lead Channel Setting Pacing Amplitude: 2.5 V
Lead Channel Setting Pacing Pulse Width: 0.4 ms
Lead Channel Setting Sensing Sensitivity: 2 mV

## 2020-10-09 NOTE — Progress Notes (Signed)
Remote pacemaker transmission.   

## 2020-10-27 ENCOUNTER — Other Ambulatory Visit: Payer: Self-pay | Admitting: Internal Medicine

## 2020-10-28 ENCOUNTER — Telehealth: Payer: Self-pay | Admitting: Nurse Practitioner

## 2020-10-28 NOTE — Telephone Encounter (Signed)
Inbound call from patient. States she believes she is having a diverticulitis flare and rectal bleeding.

## 2020-10-29 NOTE — Telephone Encounter (Signed)
Pt calling stating she started seeing a light amount of blood in her stool on Tuesday. She has no fever and reports no pain. She feels like she is starting to have a diverticulitis flare. Pt requesting tx for diverticulitis flare. Please advise.

## 2020-10-29 NOTE — Telephone Encounter (Signed)
Patient called to followup on phone call

## 2020-10-30 NOTE — Telephone Encounter (Signed)
Information presented does not warrant treatment for diverticulitis flare.  She may schedule an appointment with [place] next available me or APP or see PCP if that is sooner/better

## 2020-10-30 NOTE — Telephone Encounter (Signed)
Pt aware of Dr. Teresita Madura recommendations. States she put herself on a clear liquid diet and the bleeding she was seeing has stopped. Reports she has an appt with her PCP next week and will keep that as scheduled.

## 2020-11-04 ENCOUNTER — Ambulatory Visit (INDEPENDENT_AMBULATORY_CARE_PROVIDER_SITE_OTHER): Payer: HMO | Admitting: Internal Medicine

## 2020-11-04 ENCOUNTER — Other Ambulatory Visit: Payer: Self-pay

## 2020-11-04 ENCOUNTER — Encounter: Payer: Self-pay | Admitting: Internal Medicine

## 2020-11-04 VITALS — BP 132/66 | HR 75 | Temp 98.5°F | Ht 69.0 in | Wt 206.0 lb

## 2020-11-04 DIAGNOSIS — Z23 Encounter for immunization: Secondary | ICD-10-CM

## 2020-11-04 DIAGNOSIS — E538 Deficiency of other specified B group vitamins: Secondary | ICD-10-CM | POA: Diagnosis not present

## 2020-11-04 DIAGNOSIS — E7800 Pure hypercholesterolemia, unspecified: Secondary | ICD-10-CM

## 2020-11-04 DIAGNOSIS — N1831 Chronic kidney disease, stage 3a: Secondary | ICD-10-CM

## 2020-11-04 DIAGNOSIS — E559 Vitamin D deficiency, unspecified: Secondary | ICD-10-CM

## 2020-11-04 DIAGNOSIS — E1165 Type 2 diabetes mellitus with hyperglycemia: Secondary | ICD-10-CM | POA: Diagnosis not present

## 2020-11-04 LAB — BASIC METABOLIC PANEL
BUN: 25 mg/dL — ABNORMAL HIGH (ref 6–23)
CO2: 26 mEq/L (ref 19–32)
Calcium: 9.6 mg/dL (ref 8.4–10.5)
Chloride: 106 mEq/L (ref 96–112)
Creatinine, Ser: 1.21 mg/dL — ABNORMAL HIGH (ref 0.40–1.20)
GFR: 45.81 mL/min — ABNORMAL LOW (ref >60.00)
Glucose, Bld: 75 mg/dL (ref 70–99)
Potassium: 4.1 mEq/L (ref 3.5–5.1)
Sodium: 141 mEq/L (ref 135–145)

## 2020-11-04 LAB — HEPATIC FUNCTION PANEL
ALT: 15 U/L (ref 0–35)
AST: 19 U/L (ref 0–37)
Albumin: 4.2 g/dL (ref 3.5–5.2)
Alkaline Phosphatase: 73 U/L (ref 39–117)
Bilirubin, Direct: 0.1 mg/dL (ref 0.0–0.3)
Total Bilirubin: 0.3 mg/dL (ref 0.2–1.2)
Total Protein: 7.3 g/dL (ref 6.0–8.3)

## 2020-11-04 LAB — LIPID PANEL
Cholesterol: 159 mg/dL (ref 0–200)
HDL: 57.7 mg/dL (ref >39.00)
LDL Cholesterol: 71 mg/dL (ref 0–99)
NonHDL: 101.13
Total CHOL/HDL Ratio: 3
Triglycerides: 150 mg/dL — ABNORMAL HIGH (ref 0.0–149.0)
VLDL: 30 mg/dL (ref 0.0–40.0)

## 2020-11-04 LAB — HEMOGLOBIN A1C: Hgb A1c MFr Bld: 6.2 % (ref 4.6–6.5)

## 2020-11-04 LAB — VITAMIN D 25 HYDROXY (VIT D DEFICIENCY, FRACTURES): VITD: 26.58 ng/mL — ABNORMAL LOW (ref 30.00–100.00)

## 2020-11-04 MED ORDER — ATORVASTATIN CALCIUM 80 MG PO TABS: 80.0000 mg | ORAL_TABLET | Freq: Every day | ORAL | 3 refills | Status: DC

## 2020-11-04 MED ORDER — GLIPIZIDE ER 5 MG PO TB24: 5.0000 mg | ORAL_TABLET | Freq: Every day | ORAL | 3 refills | Status: DC

## 2020-11-04 NOTE — Progress Notes (Signed)
Patient ID: Stacey [name], female   DOB: [birth year], 70 y.o.   MRN: [id number]        Chief Complaint: follow up HTN, HLD and hyperglycemia, low vit d, ckd       HPI:  Stacey [name] is a 70 y.o. female here overall doing ok.  Taking vit d 1000 u qd.  Plans to have eye exam appt soon.  Pt denies chest pain, increased sob or doe, wheezing, orthopnea, PND, increased LE swelling, palpitations, dizziness or syncope.   Pt denies polydipsia, polyuria, or new focal neuro s/s.   Pt denies fever, wt loss, night sweats, loss of appetite, or other constitutional symptoms         Wt Readings from Last 3 Encounters:  11/04/20 206 lb (93.4 kg)  05/05/20 204 lb 12.8 oz (92.9 kg)  04/16/20 206 lb 3.2 oz (93.5 kg)   BP Readings from Last 3 Encounters:  11/04/20 132/66  05/05/20 118/70  04/16/20 114/64         Past Medical History:  Diagnosis Date   ANGIOEDEMA 03/07/2008   a. with ACE-I   ANXIETY 09/16/2006   ASTHMA 08/01/2006   CAD (coronary artery disease)    a. 01/2010 : Minimal plaque at cardiac catheterization - CFX 20%, EF 70%;  b. 08/2012 Cath: Nl Cors, EF 65%.   Chronic diastolic CHF (congestive heart failure) ([place]) 04/06/2010   a. In setting of HOCM.   Complete heart block ([place]) 02/26/2014   a. 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 s/p septal myomectomy 4/12 at [place] with Dr. Evelina Dun;  b. echo 5/12: EF 60-65%, LVOT peak 18 mmHg; grade 1 diast dysfxn, mild SAM (improved since myomectomy;  c. 03/2012 Echo: EF 60-65%, mod-sev basal septal asymm hypertrophy, basal septal HK, LVOT grad 56mmHg, Gr 1 DD, , SAM, Mild MR, nl RV, PASP 39mmHg; 08/2012 Echo: technically difficult, doubt LVOT  obstruction, EF 60%, Gr 1 DD, mod-sev dil LA.   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[place])    SICKLE CELL ANEMIA 08/01/2006   Type II or unspecified type diabetes mellitus without mention of complication, uncontrolled 08/30/2010   VENTRAL HERNIA 12/07/2006   Past Surgical History:  Procedure Laterality Date   ABDOMINAL HYSTERECTOMY  1987   CARDIAC SURGERY     COLONOSCOPY N/A 12/26/2016   Procedure: COLONOSCOPY;  Surgeon: Milus Banister, MD;  Location: [facility] ENDOSCOPY;  Service: Endoscopy;  Laterality: N/A;   ELECTROPHYSIOLOGY STUDY N/A 09/12/2012   Procedure: ELECTROPHYSIOLOGY STUDY;  Surgeon: Deboraha Sprang, MD;  Location: [facility] CATH LAB;  Service: Cardiovascular;  Laterality: N/A;   LEFT HEART CATH  Aug. 18, 2014   Medtronic heart device   LEFT HEART CATHETERIZATION WITH CORONARY ANGIOGRAM N/A 09/10/2012   Procedure: LEFT HEART CATHETERIZATION WITH CORONARY ANGIOGRAM;  Surgeon: Burnell Blanks, MD;  Location: [facility] CATH LAB;  Service: Cardiovascular;  Laterality: N/A;   MYOMECTOMY     Septal   PERMANENT PACEMAKER INSERTION N/A 02/26/2014   MDT Advisa dual chamber pacemaker implanted by Dr Caryl Comes for heart block   VENTRAL HERNIA REPAIR  06/2006    reports that she  quit smoking about 22 years ago. Her smoking use included cigarettes. She has a 5.00 pack-year smoking history. She has never used smokeless tobacco. She reports that she does not drink alcohol and does not use drugs. family history includes Clotting disorder in her daughter and another family member; Colon cancer in her daughter; Diabetes in her paternal grandmother; Heart attack in her father and sister; Heart disease in her daughter and father; Hyperlipidemia in her brother, daughter, and sister; Hypertension in her brother, father, sister, and son; Stroke in her sister. She was adopted. Allergies  Allergen Reactions   Dairy Aid [Lactase] Diarrhea  and Other (See Comments)    Bloating and gastric distress   Metformin And Related Other (See Comments)    GI upset   Ace Inhibitors Other (See Comments)    REACTION: angioedema right eye   Aspirin Other (See Comments)    Bleeding    Codeine Other (See Comments)    Hallucinations, can take if with someone.   Soy Allergy Other (See Comments)    Bleeding & cramps, diarrhea    Current Outpatient Medications on File Prior to Visit  Medication Sig Dispense Refill   acetaminophen (TYLENOL) 500 MG tablet Take 500 mg by mouth daily as needed.     albuterol (VENTOLIN HFA) 108 (90 Base) MCG/ACT inhaler Inhale 2 puffs into the lungs every 6 (six) hours as needed for wheezing or shortness of breath. 18 g 5   amLODipine (NORVASC) 10 MG tablet Take 1 tablet (10 mg total) by mouth daily. 90 tablet 1   Blood Glucose Monitoring Suppl (ONE TOUCH ULTRA 2) W/DEVICE KIT Use to check blood sugars daily Dx E11.9 1 each 0   cetirizine (ZYRTEC) 10 MG tablet Take 10 mg by mouth daily.     clonazePAM (KLONOPIN) 0.5 MG tablet TAKE 1 TABLET BY MOUTH TWICE DAILY AS NEEDED FOR ANXIETY 30 tablet 2   dicyclomine (BENTYL) 20 MG tablet Take 1 tablet (20 mg total) by mouth 3 (three) times daily between meals as needed for spasms. 60 tablet 3   Dulaglutide (TRULICITY) 1.5 GE/9.5MW SOPN Inject 1.5 mg into the skin once a week. 6 mL 3   fenofibrate (TRICOR) 145 MG tablet TAKE 1 TABLET BY MOUTH DAILY 90 tablet 3   glucose blood test strip 1 each by Other route daily. Use to check blood sugar daily Dx e11.9 100 each 11   hydrochlorothiazide (MICROZIDE) 12.5 MG capsule Take 1 capsule (12.5 mg total) by mouth daily. 90 capsule 1   hydrocortisone (ANUSOL-HC) 2.5 % rectal cream Place 1 application rectally as needed for hemorrhoids or anal itching.     losartan (COZAAR) 100 MG tablet TAKE 1 TABLET BY MOUTH DAILY 90 tablet 1   metoprolol succinate (TOPROL-XL) 100 MG 24 hr tablet TAKE 1 TABLET(100 MG) BY MOUTH DAILY WITH OR  IMMEDIATELY FOLLOWING A MEAL 90 tablet 3   ondansetron (ZOFRAN ODT) 4 MG disintegrating tablet Take 1 tablet (4 mg total) by mouth every 8 (eight) hours as needed for nausea. 10 tablet 0   ONETOUCH DELICA LANCETS 41L MISC USE TO HELP CHECK BLOOD SUGAR DAILY 200 each 0   oxcarbazepine (TRILEPTAL) 600 MG tablet TAKE 1 TABLET(600 MG) BY MOUTH TWICE DAILY 180 tablet 1   pantoprazole (PROTONIX) 40 MG tablet TAKE 1 TABLET BY MOUTH EVERY DAY 90 tablet 3   pioglitazone (ACTOS) 45 MG tablet TAKE 1 TABLET BY MOUTH DAILY 90 tablet 2   potassium chloride (KLOR-CON) 10 MEQ tablet TAKE  2 TABLETS BY MOUTH DAILY 180 tablet 1   prochlorperazine (COMPAZINE) 10 MG tablet Take 1 tablet (10 mg total) by mouth every 6 (six) hours as needed for nausea or vomiting. 30 tablet 1   topiramate (TOPAMAX) 100 MG tablet TAKE 1 TABLET(100 MG) BY MOUTH DAILY 90 tablet 1   No current facility-administered medications on file prior to visit.        ROS:  All others reviewed and negative.  Objective        PE:  BP 132/66 (BP Location: Right Arm, Patient Position: Sitting, Cuff Size: Large)   Pulse 75   Temp 98.5 F (36.9 C) (Oral)   Ht $R'5\' 9"'XF$  (1.753 m)   Wt 206 lb (93.4 kg)   SpO2 97%   BMI 30.42 kg/m                 Constitutional: Pt appears in NAD               HENT: Head: NCAT.                Right Ear: External ear normal.                 Left Ear: External ear normal.                Eyes: . Pupils are equal, round, and reactive to light. Conjunctivae and EOM are normal               Nose: without d/c or deformity               Neck: Neck supple. Gross normal ROM               Cardiovascular: Normal rate and regular rhythm.                 Pulmonary/Chest: Effort normal and breath sounds without rales or wheezing.                Abd:  Soft, NT, ND, + BS, no organomegaly               Neurological: Pt is alert. At baseline orientation, motor grossly intact               Skin: Skin is warm. No rashes, no other  new lesions, LE edema - none               Psychiatric: Pt behavior is normal without agitation   Micro: none  Cardiac tracings I have personally interpreted today:  none  Pertinent Radiological findings (summarize): none   Lab Results  Component Value Date   WBC 5.5 05/05/2020   HGB 12.0 05/05/2020   HCT 37.3 05/05/2020   PLT 223.0 05/05/2020   GLUCOSE 75 11/04/2020   CHOL 159 11/04/2020   TRIG 150.0 (H) 11/04/2020   HDL 57.70 11/04/2020   LDLDIRECT 98.0 08/10/2018   LDLCALC 71 11/04/2020   ALT 15 11/04/2020   AST 19 11/04/2020   NA 141 11/04/2020   K 4.1 11/04/2020   CL 106 11/04/2020   CREATININE 1.21 (H) 11/04/2020   BUN 25 (H) 11/04/2020   CO2 26 11/04/2020   TSH 0.93 05/05/2020   INR 1.35 12/23/2016   HGBA1C 6.2 11/04/2020   MICROALBUR 1.5 05/05/2020   Assessment/Plan:  Jannice Beitzel [name] is a 70 y.o. Black or African American [2] female with  has a past medical history of ANGIOEDEMA (03/07/2008), ANXIETY (09/16/2006), ASTHMA (08/01/2006),  CAD (coronary artery disease), Chronic diastolic CHF (congestive heart failure) ([place]) (04/06/2010), Complete heart block ([place]) (02/26/2014), COPD (chronic obstructive pulmonary disease) ([place]), DEPRESSION (09/16/2006), DIVERTICULOSIS, COLON, WITH HEMORRHAGE (04/06/2010), DM (diabetes mellitus) in pregnancy, delivered w/postpartum condition (08/30/2010), HYPERLIPIDEMIA (08/01/2006), HYPERTENSION (08/01/2006), Hypertr obst cardiomyop (08/01/2006), Impaired glucose tolerance (06/25/2010), LBBB (left bundle branch block) (06/17/2010), OBESITY (09/16/2006), 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  Diabetes ([place]) Lab Results  Component Value Date   HGBA1C 6.2 11/04/2020   Mild overcontrolled,, pt to decrease the glipizide ER to 5 mg qd   Vitamin D deficiency Last vitamin D Lab Results   Component Value Date   VD25OH 26.58 (L) 11/04/2020   Remains low, to increase oral replacement to 2000 u qd  Stage 3a chronic kidney disease ([place]) Lab Results  Component Value Date   CREATININE 1.21 (H) 11/04/2020   Stable overall, cont to avoid nephrotoxins   Hyperlipidemia Lab Results  Component Value Date   LDLCALC 71 11/04/2020   Mild uncontrolled, goal < 70, pt to ncrease current statin lipitor to 80 mg qd  Followup: Return in about 6 months (around 05/05/2021).  Cathlean Cower, MD 11/07/2020 7:22 PM [facility]

## 2020-11-04 NOTE — Patient Instructions (Addendum)
You had the flu shot today  Ok to DOUBLE up on your home VIt D3  Ok to decrease the glipizide ER to 5 mg per day  Ok to increase the lipitor to 80 mg per day  Please continue all other medications as before, and refills have been done if requested.  Please have the pharmacy call with any other refills you may need.  Please continue your efforts at being more active, low cholesterol diet, and weight control.  Please keep your appointments with your specialists as you may have planned - your eye doctor yearly  Please go to the LAB at the blood drawing area for the tests to be done  You will be contacted by phone if any changes need to be made immediately.  Otherwise, you will receive a letter about your results with an explanation, but please check with MyChart first.  Please remember to sign up for MyChart if you have not done so, as this will be important to you in the future with finding out test results, communicating by private email, and scheduling acute appointments online when needed.  Please make an Appointment to return in 6 months, or sooner if needed

## 2020-11-07 ENCOUNTER — Encounter: Payer: Self-pay | Admitting: Internal Medicine

## 2020-11-07 NOTE — Assessment & Plan Note (Signed)
Lab Results  Component Value Date   HGBA1C 6.2 11/04/2020   Mild overcontrolled,, pt to decrease the glipizide ER to 5 mg qd

## 2020-11-07 NOTE — Assessment & Plan Note (Signed)
Lab Results  Component Value Date   LDLCALC 71 11/04/2020   Mild uncontrolled, goal < 70, pt to ncrease current statin lipitor to 80 mg qd

## 2020-11-07 NOTE — Assessment & Plan Note (Signed)
Lab Results  Component Value Date   CREATININE 1.21 (H) 11/04/2020   Stable overall, cont to avoid nephrotoxins

## 2020-11-07 NOTE — Assessment & Plan Note (Signed)
Last vitamin D Lab Results  Component Value Date   VD25OH 26.58 (L) 11/04/2020   Remains low, to increase oral replacement to 2000 u qd

## 2020-12-02 ENCOUNTER — Telehealth: Payer: Self-pay | Admitting: *Deleted

## 2020-12-02 NOTE — Chronic Care Management (AMB) (Signed)
  Chronic Care Management   Note  12/02/2020 Name: Stacey [name] MRN: [id number] DOB: 03/10/1950  Stacey [name] is a 70 y.o. year old female who is a primary care patient of John, James W, MD. I reached out to Stacey [name] by phone today in response to a referral sent by Stacey [name]'s PCP.  Stacey [name] was given information about Chronic Care Management services today including:  CCM service includes personalized support from designated clinical staff supervised by her physician, including individualized plan of care and coordination with other care providers 24/7 contact phone numbers for assistance for urgent and routine care needs. Service will only be billed when office clinical staff spend 20 minutes or more in a month to coordinate care. Only one practitioner may furnish and bill the service in a calendar month. The patient may stop CCM services at any time (effective at the end of the month) by phone call to the office staff. The patient is responsible for co-pay (up to 20% after annual deductible is met) if co-pay is required by the individual health plan.   Patient agreed to services and verbal consent obtained.   Follow up plan: Telephone appointment with care management team member scheduled for:12/08/20  Stacey Snead  Care Guide, Embedded Care Coordination [place]  Care Management  Direct Dial: [phone]  

## 2020-12-08 ENCOUNTER — Ambulatory Visit (INDEPENDENT_AMBULATORY_CARE_PROVIDER_SITE_OTHER): Payer: HMO | Admitting: *Deleted

## 2020-12-08 DIAGNOSIS — E7800 Pure hypercholesterolemia, unspecified: Secondary | ICD-10-CM

## 2020-12-08 DIAGNOSIS — E1165 Type 2 diabetes mellitus with hyperglycemia: Secondary | ICD-10-CM

## 2020-12-08 NOTE — Chronic Care Management (AMB) (Addendum)
Chronic Care Management   CCM RN Visit Note  12/08/2020 Name: Stacey [name] MRN: [id number] DOB: [birth year]  Subjective: Stacey [name] is a 70 y.o. year old female who is a primary care Stacey of Biagio Borg, MD. The care management team was consulted for assistance with disease management and care coordination needs.    Engaged with Stacey by telephone for initial visit in response to provider referral for case management and/or care coordination services.   Consent to Services:  The Stacey was given information about Chronic Care Management services, agreed to services, and gave verbal consent 12/02/20 prior to initiation of services.  Please see initial visit note for detailed documentation.  Stacey agreed to services and verbal consent obtained.   Assessment: Review of Stacey past medical history, allergies, medications, health status, including review of consultants reports, laboratory and other test data, was performed as part of comprehensive evaluation and provision of chronic care management services.   SDOH (Social Determinants of Health) assessments and interventions performed:  SDOH Interventions    Flowsheet Row Most Recent Value  SDOH Interventions   Food Insecurity Interventions Intervention Not Indicated  Housing Interventions Intervention Not Indicated  [single family home]  Transportation Interventions Intervention Not Indicated  [Stacey drives self]      CCM Care Plan Allergies  Allergen Reactions   Dairy Aid [Tilactase] Diarrhea and Other (See Comments)    Bloating and gastric distress   Metformin And Related Other (See Comments)    GI upset   Ace Inhibitors Other (See Comments)    REACTION: angioedema right eye   Aspirin Other (See Comments)    Bleeding    Codeine Other (See Comments)    Hallucinations, can take if with someone.   Soy Allergy Other (See Comments)    Bleeding & cramps, diarrhea    Outpatient Encounter  Medications as of 12/08/2020  Medication Sig   acetaminophen (TYLENOL) 500 MG tablet Take 500 mg by mouth daily as needed.   albuterol (VENTOLIN HFA) 108 (90 Base) MCG/ACT inhaler Inhale 2 puffs into the lungs every 6 (six) hours as needed for wheezing or shortness of breath.   amLODipine (NORVASC) 10 MG tablet Take 1 tablet (10 mg total) by mouth daily.   atorvastatin (LIPITOR) 80 MG tablet Take 1 tablet (80 mg total) by mouth daily.   Blood Glucose Monitoring Suppl (ONE TOUCH ULTRA 2) W/DEVICE KIT Use to check blood sugars daily Dx E11.9   cetirizine (ZYRTEC) 10 MG tablet Take 10 mg by mouth daily.   clonazePAM (KLONOPIN) 0.5 MG tablet TAKE 1 TABLET BY MOUTH TWICE DAILY AS NEEDED FOR ANXIETY   dicyclomine (BENTYL) 20 MG tablet Take 1 tablet (20 mg total) by mouth 3 (three) times daily between meals as needed for spasms.   Dulaglutide (TRULICITY) 1.5 DJ/5.7SV SOPN Inject 1.5 mg into the skin once a week.   fenofibrate (TRICOR) 145 MG tablet TAKE 1 TABLET BY MOUTH DAILY   glipiZIDE (GLUCOTROL XL) 5 MG 24 hr tablet Take 1 tablet (5 mg total) by mouth daily with breakfast.   glucose blood test strip 1 each by Other route daily. Use to check blood sugar daily Dx e11.9   hydrochlorothiazide (MICROZIDE) 12.5 MG capsule Take 1 capsule (12.5 mg total) by mouth daily.   hydrocortisone (ANUSOL-HC) 2.5 % rectal cream Place 1 application rectally as needed for hemorrhoids or anal itching.   losartan (COZAAR) 100 MG tablet TAKE 1 TABLET BY MOUTH DAILY   metoprolol succinate (TOPROL-XL)  100 MG 24 hr tablet TAKE 1 TABLET(100 MG) BY MOUTH DAILY WITH OR IMMEDIATELY FOLLOWING A MEAL   ondansetron (ZOFRAN ODT) 4 MG disintegrating tablet Take 1 tablet (4 mg total) by mouth every 8 (eight) hours as needed for nausea.   ONETOUCH DELICA LANCETS 28U MISC USE TO HELP CHECK BLOOD SUGAR DAILY   oxcarbazepine (TRILEPTAL) 600 MG tablet TAKE 1 TABLET(600 MG) BY MOUTH TWICE DAILY   pantoprazole (PROTONIX) 40 MG tablet TAKE  1 TABLET BY MOUTH EVERY DAY   pioglitazone (ACTOS) 45 MG tablet TAKE 1 TABLET BY MOUTH DAILY   potassium chloride (KLOR-CON) 10 MEQ tablet TAKE 2 TABLETS BY MOUTH DAILY   prochlorperazine (COMPAZINE) 10 MG tablet Take 1 tablet (10 mg total) by mouth every 6 (six) hours as needed for nausea or vomiting.   topiramate (TOPAMAX) 100 MG tablet TAKE 1 TABLET(100 MG) BY MOUTH DAILY   No facility-administered encounter medications on file as of 12/08/2020.   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   Hematochezia    Acute blood loss anemia    Fibrinogen decreased ([facility])    Lower GI bleed 12/21/2016   Acute gastroenteritis 12/31/2015   N&V (nausea and vomiting) 09/29/2015   Diarrhea 09/29/2015   Abdominal mass, RLQ (right lower quadrant) 09/04/2015   Preventative health care 09/04/2015   Wheezing 11/14/2014   S/P myomectomy 04/08/2014   Complete heart block ([place]) 02/26/2014   LINQ recorder  02/26/2014   Pacemaker-Medtronic  MRI compatible 02/26/2014   Nocturnal hypoxemia 01/30/2014   Pancreatitis 10/22/2013   Odynophagia 10/22/2013   Chest pain syndrome 10/21/2013   Syncope 12/18/2012   Loop recorder-LINQ 12/18/2012   Palpitations 09/12/2012   Midsternal chest pain 09/12/2012   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   Abdominal pain 07/24/2012   Polyarthralgia 07/24/2012   CAD (coronary artery disease) 05/04/2012   COPD (chronic obstructive pulmonary disease) ([place]) 05/04/2012   Angioedema 03/04/2012   Varicose veins of lower extremities with other complications 13/24/4010   Abnormal liver function test 09/07/2011   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   Trigeminal  neuralgia 08/15/2011   Vertigo 07/27/2011   Diabetes ([place]) 08/30/2010   Encounter for long-term (current) use of high-risk medication 08/30/2010   Status post myomectomy 06/17/2010   LBBB (left bundle branch block) 27/25/3664   Diastolic heart failure ([place]) 04/06/2010   Diverticulosis of large intestine 04/06/2010   Diverticulosis 04/06/2010   RECTAL BLEEDING 04/06/2010   BRADYCARDIA 10/13/2009   ALLERGIC RHINITIS 12/07/2006   VENTRAL HERNIA 12/07/2006   COLONIC POLYPS, HX OF 12/07/2006   OBESITY 09/16/2006   ANXIETY 09/16/2006   DEPRESSION 09/16/2006   Hyperlipidemia 08/01/2006   Sickle cell anemia ([place]) 08/01/2006   Essential hypertension 08/01/2006   Hypertrophic obstructive cardiomyopathy ([place]) 08/01/2006   Asthma 08/01/2006   Conditions to be addressed/monitored:  HLD and DMII  Care Plan : RN Care Manager Plan of Care  Updates made by Knox Royalty, RN since 12/08/2020 12:00 AM     Problem: Chronic Disease Management Needs   Priority: High     Long-Range Goal: Development of Plan of Care for long term chronic disease management   Start Date: 12/08/2020  Expected  End Date: 12/08/2021  Priority: High  Note:   Current Barriers:  Chronic Disease Management support and education needs related to HLD and DMII  RNCM Clinical Goal(s):  Stacey will demonstrate Ongoing health management independence DMII; HLD  through collaboration with RN Care manager, provider, and care team.   Interventions: 1:1 collaboration with primary care provider regarding development and update of comprehensive plan of care as evidenced by provider attestation and co-signature Inter-disciplinary care team collaboration (see longitudinal plan of care) Evaluation of current treatment plan related to  self management and Stacey's adherence to plan as established by provider  Hyperlipidemia Interventions: GOAL STATUS: New Goal Medication review performed; medication list updated in electronic  medical record.  Provider established cholesterol goals reviewed; Counseled on importance of regular laboratory monitoring as prescribed; Provided HLD educational materials; Reviewed role and benefits of statin for ASCVD risk reduction; Reviewed importance of limiting foods high in cholesterol; Reviewed exercise goals and target of 150 minutes per week; Screening for signs and symptoms of depression related to chronic disease state;  Assessed social determinant of health barriers;  Confirmed Stacey has received flu vaccine for 2022-23 flu/ winter season and is up to date on COVID vaccine/ booster Assessed pain: denies chronic pain; reports "occasional" arthritis pain, well controlled by OTC "salves" Love referral for resources around dental provider services- reports her previous dental provider will not accept insurance, and reports limited insurance benefits, if any for dental services/ care  Diabetes Interventions: GOAL STATUS: New Goal Assessed Stacey's understanding of A1c goal: <6.5% Provided education to Stacey about basic DM disease process; Counseled on importance of regular laboratory monitoring as prescribed; Discussed plans with Stacey for ongoing care management follow up and provided Stacey with direct contact information for care management team; Reviewed scheduled/upcoming provider appointments including: None noted in 2022- encouraged Stacey to continue efforts to schedule annual eye exam; Referral made to community resources care guide team for assistance with dental resources- insurance plan does not cover dental; Review of Stacey status, including review of consultants reports, relevant laboratory and other test results, and medications completed; Reviewed recent fasting blood sugars: reports consistent values at home between 70-120; reports general ranges between 86-100; reports today's fasting at "87" Discussed signs/ symptoms  hypoglycemia: she denies signs/ symptoms- even when her blood sugars are as low as 70; reports she eats something after checking daily fasting blood sugars Reviewed current dietary strategies for self- management of DMII: Stacey currently limits carbohydrates, sugar, and portions; also follows general heart healthy, low salt diet- positive reinforcement provided with encouragement to continue efforts Confirmed no current medication concerns; independently self- manages, does not use pill box, has her own organization system at home that works for her Reviewed recent PCP office visit with Stacey 11/04/20: confirms she is taking new doses of atorvastatin and glipizide Discussed role of activity in setting of HLD/ DMII- Stacey active at baseline but does not get structured exercise regularly: benefits of structured exercise and activity discussed Discussed/ provided education around cardiovascular risks/ complications of high blood sugar in setting of HLD  Lab Results  Component Value Date   HGBA1C 6.2 11/04/2020  Stacey Goals/Self-Care Activities: As evidenced by review of EHR, collaboration with care team, and Stacey reporting during CCM RN CM outreach, Stacey [name] will: Self administer medications as prescribed Attend all scheduled provider appointments Call pharmacy for medication refills Call provider office for new concerns or questions Continue to check fasting (first thing in the morning, before  eating) blood sugars at home Continue to follow heart healthy, low salt, low cholesterol, carbohydrate-modified, low sugar diet Review enclosed educational material around food choices in setting of HLD Talk to the [place] to obtain possible resources available for dental provider services Continue efforts to schedule annual eye exam  Follow Up Plan:   Telephone follow up appointment with care management team member scheduled for:  Tuesday, March 02, 2021 at  11:30 am The Stacey has been provided with contact information for the care management team and has been advised to call with any health related questions or concerns     Plan: The Stacey has been provided with contact information for the care management team and has been advised to call with any health related questions or concerns  [name], RN, BSN, [place] [phone]: direct office [phone]: mobile

## 2020-12-08 NOTE — Patient Instructions (Signed)
Visit [place], it was nice talking with you today.    I look forward to talking to you again for a telephone update on Tuesday, March 02, 2021 at 11:30 am- please be listening out for my call that day.  I will call as close to 11:30 am as possible.   If you need to cancel or re-schedule our telephone visit, please call [phone] and one of our care guides will be happy to assist you.   I look forward to hearing about your progress.   Please don't hesitate to contact me if I can be of assistance to you before our next scheduled telephone appointment.   Stacey Rack, RN, BSN, [facility] RN Care Coordination- [place] [phone]: direct office [phone]: mobile   Patient Self-Care Activities: Patient Stacey [name] will: Self administer medications as prescribed Attend all scheduled provider appointments Call pharmacy for medication refills Call provider office for new concerns or questions Continue to check fasting (first thing in the morning, before eating) blood sugars at home Continue to follow heart healthy, low salt, low cholesterol, carbohydrate-modified, low sugar diet Review enclosed educational material around food choices in setting of HLD Talk to the [place] to obtain possible resources available for dental provider services Continue efforts to schedule annual eye exam  Cholesterol Content in Foods Cholesterol is a waxy, fat-like substance that helps to carry fat in the blood. The body needs cholesterol in small amounts, but too much cholesterol can cause damage to the arteries and heart. What foods have cholesterol? Cholesterol is found in animal-based foods, such as meat, seafood, and dairy. Generally, low-fat dairy and lean meats have less cholesterol than full-fat dairy and fatty meats. The milligrams of cholesterol per serving (mg per serving) of common cholesterol-containing foods are listed  below. Meats and other proteins Egg -- one large whole egg has 186 mg. Veal shank -- 4 oz (113 g) has 141 mg. Lean ground [place] (93% lean) -- 4 oz (113 g) has 118 mg. Fat-trimmed lamb loin -- 4 oz (113 g) has 106 mg. Lean ground beef (90% lean) -- 4 oz (113 g) has 100 mg. Lobster -- 3.5 oz (99 g) has 90 mg. Pork loin chops -- 4 oz (113 g) has 86 mg. Canned salmon -- 3.5 oz (99 g) has 83 mg. Fat-trimmed beef top loin -- 4 oz (113 g) has 78 mg. Frankfurter -- 1 frank (3.5 oz or 99 g) has 77 mg. Crab -- 3.5 oz (99 g) has 71 mg. Roasted chicken without skin, white meat -- 4 oz (113 g) has 66 mg. Light bologna -- 2 oz (57 g) has 45 mg. Deli-cut [place] -- 2 oz (57 g) has 31 mg. Canned tuna -- 3.5 oz (99 g) has 31 mg. Stacey [name] -- 1 oz (28 g) has 29 mg. Oysters and mussels (raw) -- 3.5 oz (99 g) has 25 mg. Mackerel -- 1 oz (28 g) has 22 mg. Trout -- 1 oz (28 g) has 20 mg. Pork sausage -- 1 link (1 oz or 28 g) has 17 mg. Salmon -- 1 oz (28 g) has 16 mg. Tilapia -- 1 oz (28 g) has 14 mg. Dairy Soft-serve ice cream --  cup (4 oz or 86 g) has 103 mg. Whole-milk yogurt -- 1 cup (8 oz or 245 g) has 29 mg. Cheddar cheese -- 1 oz (28 g) has 28 mg. American cheese -- 1 oz (28 g) has  28 mg. Whole milk -- 1 cup (8 oz or 250 mL) has 23 mg. 2% milk -- 1 cup (8 oz or 250 mL) has 18 mg. Cream cheese -- 1 tablespoon (Tbsp) (14.5 g) has 15 mg. Cottage cheese --  cup (4 oz or 113 g) has 14 mg. Low-fat (1%) milk -- 1 cup (8 oz or 250 mL) has 10 mg. Sour cream -- 1 Tbsp (12 g) has 8.5 mg. Low-fat yogurt -- 1 cup (8 oz or 245 g) has 8 mg. Nonfat Greek yogurt -- 1 cup (8 oz or 228 g) has 7 mg. Half-and-half cream -- 1 Tbsp (15 mL) has 5 mg. Fats and oils Cod liver oil -- 1 tablespoon (Tbsp) (13.6 g) has 82 mg. Butter -- 1 Tbsp (14 g) has 15 mg. Lard -- 1 Tbsp (12.8 g) has 14 mg. Bacon grease -- 1 Tbsp (12.9 g) has 14 mg. Mayonnaise -- 1 Tbsp (13.8 g) has 5-10 mg. Margarine -- 1 Tbsp (14 g) has 3-10  mg. The items listed above may not be a complete list of foods with cholesterol. Exact amounts of cholesterol in these foods may vary depending on specific ingredients and brands. Contact a dietitian for more information. What foods do not have cholesterol? Most plant-based foods do not have cholesterol unless you combine them with a food that has cholesterol. Foods without cholesterol include: Grains and cereals. Vegetables. Fruits. Vegetable oils, such as olive, canola, and sunflower oil. Legumes, such as peas, beans, and lentils. Nuts and seeds. Egg whites. The items listed above may not be a complete list of foods that do not have cholesterol. Contact a dietitian for more information. Summary The body needs cholesterol in small amounts, but too much cholesterol can cause damage to the arteries and heart. Cholesterol is found in animal-based foods, such as meat, seafood, and dairy. Generally, low-fat dairy and lean meats have less cholesterol than full-fat dairy and fatty meats. This information is not intended to replace advice given to you by your health care provider. Make sure you discuss any questions you have with your health care provider. Document Revised: 05/22/2020 Document Reviewed: 05/22/2020 Elsevier Patient Education  2022 [place].   PATIENT GOALS/PLAN OF CARE:  Care Plan : RN Care Manager Plan of Care  Updates made by Knox Royalty, RN since 12/08/2020 12:00 AM     Problem: Chronic Disease Management Needs   Priority: High     Long-Range Goal: Development of Plan of Care for long term chronic disease management   Start Date: 12/08/2020  Expected End Date: 12/08/2021  Priority: High  Note:   Current Barriers:  Chronic Disease Management support and education needs related to HLD and DMII  RNCM Clinical Goal(s):  Patient will demonstrate Ongoing health management independence DMII; HLD  through collaboration with RN Care manager, provider, and care team.    Interventions: 1:1 collaboration with primary care provider regarding development and update of comprehensive plan of care as evidenced by provider attestation and co-signature Inter-disciplinary care team collaboration (see longitudinal plan of care) Evaluation of current treatment plan related to  self management and patient's adherence to plan as established by provider  Hyperlipidemia Interventions: GOAL STATUS: New Goal Medication review performed; medication list updated in electronic medical record.  Provider established cholesterol goals reviewed; Counseled on importance of regular laboratory monitoring as prescribed; Provided HLD educational materials; Reviewed role and benefits of statin for ASCVD risk reduction; Reviewed importance of limiting foods high in cholesterol; Reviewed exercise  goals and target of 150 minutes per week; Screening for signs and symptoms of depression related to chronic disease state;  Assessed social determinant of health barriers;  Confirmed patient has received flu vaccine for 2022-23 flu/ winter season and is up to date on COVID vaccine/ booster  Diabetes Interventions: GOAL STATUS: New Goal Assessed patient's understanding of A1c goal: <6.5% Provided education to patient about basic DM disease process; Counseled on importance of regular laboratory monitoring as prescribed; Discussed plans with patient for ongoing care management follow up and provided patient with direct contact information for care management team; Reviewed scheduled/upcoming provider appointments including: None noted in 2022- encouraged patient to continue efforts to schedule annual eye exam; Referral made to community resources care guide team for assistance with dental resources- insurance plan does not cover dental; Review of patient status, including review of consultants reports, relevant laboratory and other test results, and medications completed; Reviewed recent fasting  blood sugars: reports consistent values at home between 70-120; reports general ranges between 86-100; reports today's fasting at "87" Discussed signs/ symptoms hypoglycemia: she denies signs/ symptoms- even when her blood sugars are as low as 70; reports she eats something after checking daily fasting blood sugars Reviewed current dietary strategies for self- management of DMII: patient currently limits carbohydrates, sugar, and portions; also follows general heart healthy, low salt diet- positive reinforcement provided with encouragement to continue efforts Confirmed no current medication concerns; independently self- manages, does not use pill box, has her own organization system at home that works for her Reviewed recent PCP office visit with patient 11/04/20: confirms she is taking new doses of atorvastatin and glipizide Discussed role of activity in setting of HLD/ DMII- patient active at baseline but does not get structured exercise regularly: benefits of structured exercise and activity discussed Discussed/ provided education around cardiovascular risks/ complications of high blood sugar in setting of HLD  Lab Results  Component Value Date   HGBA1C 6.2 11/04/2020  Patient Goals/Self-Care Activities: As evidenced by review of EHR, collaboration with care team, and patient reporting during CCM RN CM outreach, Patient [place] will: Self administer medications as prescribed Attend all scheduled provider appointments Call pharmacy for medication refills Call provider office for new concerns or questions Continue to check fasting (first thing in the morning, before eating) blood sugars at home Continue to follow heart healthy, low salt, low cholesterol, carbohydrate-modified, low sugar diet Review enclosed educational material around food choices in setting of HLD Talk to the [place] to obtain possible resources available for dental provider services Continue efforts to  schedule annual eye exam  Follow Up Plan:   Telephone follow up appointment with care management team member scheduled for:  Tuesday, March 02, 2021 at 11:30 am The patient has been provided with contact information for the care management team and has been advised to call with any health related questions or concerns     Consent to CCM Services: [name] was given information 12/02/20 about Chronic Care Management services including:  CCM service includes personalized support from designated clinical staff supervised by her physician, including individualized plan of care and coordination with other care providers 24/7 contact phone numbers for assistance for urgent and routine care needs. Service will only be billed when office clinical staff spend 20 minutes or more in a month to coordinate care. Only one practitioner may furnish and bill the service in a calendar month. The patient may stop CCM services at any time (effective at the  end of the month) by phone call to the office staff. The patient will be responsible for cost sharing (co-pay) of up to 20% of the service fee (after annual deductible is met).  Patient agreed to services and verbal consent obtained.   Patient verbalizes understanding of instructions provided today and agrees to view in MyChart Telephone follow up appointment with care management team member scheduled for:  Tuesday, March 02, 2021 at 11:30 am The patient has been provided with contact information for the care management team and has been advised to call with any health related questions or concerns  Stacey Rack, RN, BSN, [place] [phone]: direct office [phone]: mobile

## 2020-12-08 NOTE — Addendum Note (Signed)
Addended by: Michaela Corner on: 12/08/2020 01:00 PM   Modules accepted: Orders

## 2020-12-15 ENCOUNTER — Other Ambulatory Visit: Payer: Self-pay | Admitting: Internal Medicine

## 2020-12-23 DIAGNOSIS — E7800 Pure hypercholesterolemia, unspecified: Secondary | ICD-10-CM

## 2020-12-23 DIAGNOSIS — Z7984 Long term (current) use of oral hypoglycemic drugs: Secondary | ICD-10-CM

## 2020-12-23 DIAGNOSIS — E1165 Type 2 diabetes mellitus with hyperglycemia: Secondary | ICD-10-CM

## 2020-12-29 ENCOUNTER — Telehealth: Payer: Self-pay

## 2020-12-29 NOTE — Telephone Encounter (Signed)
   Telephone encounter was:  Successful.  12/29/2020 Name: Stacey [name] MRN: [id number] DOB: [birth year]  Stacey [name] is a 70 y.o. year old female who is a primary care patient of Jonny Ruiz, Len Blalock, MD . The community resource team was consulted for assistance with  dental provider information.  Care guide performed the following interventions: Spoke with patient about contacting [facility] for assistance in verifying if the dentist she wants to go to accepts her insurance. Patient stated that she would call this week.  Follow Up Plan:  Care guide will follow up with patient by phone over the next 7-10 days  Burel Kahre, AAS Paralegal, [facility] Care Guide  Embedded Care Coordination [facility]  Care Management  [address] [place], [place] [address] ??[email or web address]  ?? [phone]   [email or web address]

## 2021-01-04 ENCOUNTER — Ambulatory Visit (INDEPENDENT_AMBULATORY_CARE_PROVIDER_SITE_OTHER): Payer: HMO

## 2021-01-04 ENCOUNTER — Ambulatory Visit: Payer: HMO | Admitting: *Deleted

## 2021-01-04 DIAGNOSIS — E7800 Pure hypercholesterolemia, unspecified: Secondary | ICD-10-CM

## 2021-01-04 DIAGNOSIS — I495 Sick sinus syndrome: Secondary | ICD-10-CM

## 2021-01-04 DIAGNOSIS — E1165 Type 2 diabetes mellitus with hyperglycemia: Secondary | ICD-10-CM

## 2021-01-04 NOTE — Chronic Care Management (AMB) (Signed)
Chronic Care Management   CCM RN Visit Note  01/04/2021 Name: Stacey [name] MRN: [id number] DOB: [birth year]  Subjective: Stacey [name] is a 70 y.o. year old female who is a primary care patient of Biagio Borg, MD. The care management team was consulted for assistance with disease management and care coordination needs.    Engaged with patient by telephone for  acute/ unscheduled outreach  in response to provider referral for case management and/or care coordination services.   Consent to Services:  The patient was given information about Chronic Care Management services, agreed to services, and gave verbal consent prior to initiation of services.  Please see initial visit note for detailed documentation.  Patient agreed to services and verbal consent obtained.   Assessment: Review of patient past medical history, allergies, medications, health status, including review of consultants reports, laboratory and other test data, was performed as part of comprehensive evaluation and provision of chronic care management services.   CCM Care Plan  Allergies  Allergen Reactions   Dairy Aid [Tilactase] Diarrhea and Other (See Comments)    Bloating and gastric distress   Metformin And Related Other (See Comments)    GI upset   Ace Inhibitors Other (See Comments)    REACTION: angioedema right eye   Aspirin Other (See Comments)    Bleeding    Codeine Other (See Comments)    Hallucinations, can take if with someone.   Soy Allergy Other (See Comments)    Bleeding & cramps, diarrhea    Outpatient Encounter Medications as of 01/04/2021  Medication Sig   acetaminophen (TYLENOL) 500 MG tablet Take 500 mg by mouth daily as needed.   albuterol (VENTOLIN HFA) 108 (90 Base) MCG/ACT inhaler Inhale 2 puffs into the lungs every 6 (six) hours as needed for wheezing or shortness of breath.   amLODipine (NORVASC) 10 MG tablet Take 1 tablet (10 mg total) by mouth daily.    atorvastatin (LIPITOR) 80 MG tablet Take 1 tablet (80 mg total) by mouth daily.   Blood Glucose Monitoring Suppl (ONE TOUCH ULTRA 2) W/DEVICE KIT Use to check blood sugars daily Dx E11.9   cetirizine (ZYRTEC) 10 MG tablet Take 10 mg by mouth daily.   clonazePAM (KLONOPIN) 0.5 MG tablet TAKE 1 TABLET BY MOUTH TWICE DAILY AS NEEDED FOR ANXIETY   dicyclomine (BENTYL) 20 MG tablet Take 1 tablet (20 mg total) by mouth 3 (three) times daily between meals as needed for spasms.   Dulaglutide (TRULICITY) 1.5 OH/6.0VP SOPN Inject 1.5 mg into the skin once a week.   fenofibrate (TRICOR) 145 MG tablet TAKE 1 TABLET BY MOUTH DAILY   glipiZIDE (GLUCOTROL XL) 5 MG 24 hr tablet Take 1 tablet (5 mg total) by mouth daily with breakfast.   glucose blood test strip 1 each by Other route daily. Use to check blood sugar daily Dx e11.9   hydrochlorothiazide (MICROZIDE) 12.5 MG capsule Take 1 capsule (12.5 mg total) by mouth daily.   hydrocortisone (ANUSOL-HC) 2.5 % rectal cream Place 1 application rectally as needed for hemorrhoids or anal itching.   losartan (COZAAR) 100 MG tablet TAKE 1 TABLET BY MOUTH DAILY   metoprolol succinate (TOPROL-XL) 100 MG 24 hr tablet TAKE 1 TABLET(100 MG) BY MOUTH DAILY WITH OR IMMEDIATELY FOLLOWING A MEAL   ondansetron (ZOFRAN ODT) 4 MG disintegrating tablet Take 1 tablet (4 mg total) by mouth every 8 (eight) hours as needed for nausea.   ONETOUCH DELICA LANCETS 71G MISC USE TO HELP  CHECK BLOOD SUGAR DAILY   oxcarbazepine (TRILEPTAL) 600 MG tablet TAKE 1 TABLET(600 MG) BY MOUTH TWICE DAILY   pantoprazole (PROTONIX) 40 MG tablet TAKE 1 TABLET BY MOUTH EVERY DAY   pioglitazone (ACTOS) 45 MG tablet TAKE 1 TABLET BY MOUTH DAILY   potassium chloride (KLOR-CON) 10 MEQ tablet TAKE 2 TABLETS BY MOUTH DAILY   prochlorperazine (COMPAZINE) 10 MG tablet Take 1 tablet (10 mg total) by mouth every 6 (six) hours as needed for nausea or vomiting.   topiramate (TOPAMAX) 100 MG tablet TAKE 1 TABLET(100  MG) BY MOUTH DAILY   No facility-administered encounter medications on file as of 01/04/2021.   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   Hematochezia    Acute blood loss anemia    Fibrinogen decreased ([facility])    Lower GI bleed 12/21/2016   Acute gastroenteritis 12/31/2015   N&V (nausea and vomiting) 09/29/2015   Diarrhea 09/29/2015   Abdominal mass, RLQ (right lower quadrant) 09/04/2015   Preventative health care 09/04/2015   Wheezing 11/14/2014   S/P myomectomy 04/08/2014   Complete heart block ([place]) 02/26/2014   LINQ recorder  02/26/2014   Pacemaker-Medtronic  MRI compatible 02/26/2014   Nocturnal hypoxemia 01/30/2014   Pancreatitis 10/22/2013   Odynophagia 10/22/2013   Chest pain syndrome 10/21/2013   Syncope 12/18/2012   Loop recorder-LINQ 12/18/2012   Palpitations 09/12/2012   Midsternal chest pain 09/12/2012   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   Abdominal pain 07/24/2012   Polyarthralgia 07/24/2012   CAD (coronary artery disease) 05/04/2012   COPD (chronic obstructive pulmonary disease) ([place]) 05/04/2012   Angioedema 03/04/2012   Varicose veins of lower extremities with other complications 29/51/8841   Abnormal liver function test 09/07/2011   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   Trigeminal neuralgia 08/15/2011   Vertigo 07/27/2011   Diabetes ([place]) 08/30/2010   Encounter for long-term (current) use of high-risk medication 08/30/2010   Status post myomectomy 06/17/2010   LBBB (left bundle branch block) 66/06/3014   Diastolic heart failure ([place]) 04/06/2010   Diverticulosis of large intestine 04/06/2010   Diverticulosis 04/06/2010   RECTAL BLEEDING  04/06/2010   BRADYCARDIA 10/13/2009   ALLERGIC RHINITIS 12/07/2006   VENTRAL HERNIA 12/07/2006   COLONIC POLYPS, HX OF 12/07/2006   OBESITY 09/16/2006   ANXIETY 09/16/2006   DEPRESSION 09/16/2006   Hyperlipidemia 08/01/2006   Sickle cell anemia ([place]) 08/01/2006   Essential hypertension 08/01/2006   Hypertrophic obstructive cardiomyopathy ([place]) 08/01/2006   Asthma 08/01/2006   Conditions to be addressed/monitored:  HLD and DMII, new onset acute nasal congestion/ sore throat  Care Plan : RN Care Manager Plan of Care  Updates made by Knox Royalty, RN since 01/04/2021 12:00 AM     Problem: Chronic Disease Management Needs   Priority: High     Long-Range Goal: Development of Plan of Care for long term chronic disease management   Start Date: 12/08/2020  Expected End Date: 12/08/2021  Priority: High  Note:   Current Barriers:  Chronic Disease Management support and education needs related to HLD and DMII  RNCM Clinical Goal(s):  Patient will demonstrate Ongoing health management independence DMII; HLD  through collaboration with RN Care manager,  provider, and care team.   Interventions: 1:1 collaboration with primary care provider regarding development and update of comprehensive plan of care as evidenced by provider attestation and co-signature Inter-disciplinary care team collaboration (see longitudinal plan of care) Evaluation of current treatment plan related to  self management and patient's adherence to plan as established by provider 01/04/21: Acute/ unscheduled call from patient: returned patient's call Patient reports acute symptoms of sinus congestion, ear ache, sore throat that have persisted since onset on Thursday 12/31/20 Denies fever, body aches- states she thinks it is "a sinus infection or bad head cold;" took home COVID test: resulted negative Has been taking various OTC medications without relief in symtpoms States attempted to call office to schedule urgent  appointment- no one answered phone, so she is calling me Discussed options for treatment: advised patient to re-contact office schedulers and inform of her symptoms/ need for urgent office visit; she also understands that she can seek care at [facility] if necessary  Denies other concerns with medications, DMII/ HLD- states "nothing else" needed/ changed today  Hyperlipidemia Interventions: GOAL STATUS: New Goal Medication review performed; medication list updated in electronic medical record.  Provider established cholesterol goals reviewed; Counseled on importance of regular laboratory monitoring as prescribed; Provided HLD educational materials; Reviewed role and benefits of statin for ASCVD risk reduction; Reviewed importance of limiting foods high in cholesterol; Reviewed exercise goals and target of 150 minutes per week; Screening for signs and symptoms of depression related to chronic disease state;  Assessed social determinant of health barriers;  Confirmed patient has received flu vaccine for 2022-23 flu/ winter season and is up to date on COVID vaccine/ booster Assessed pain: denies chronic pain; reports "occasional" arthritis pain, well controlled by OTC "salves" [place] referral for resources around dental provider services- reports her previous dental provider will not accept insurance, and reports limited insurance benefits, if any for dental services/ care  Diabetes Interventions: GOAL STATUS: New Goal Assessed patient's understanding of A1c goal: <6.5% Provided education to patient about basic DM disease process; Counseled on importance of regular laboratory monitoring as prescribed; Discussed plans with patient for ongoing care management follow up and provided patient with direct contact information for care management team; Reviewed scheduled/upcoming provider appointments including: None noted in 2022- encouraged patient to continue efforts to schedule  annual eye exam; Referral made to community resources care guide team for assistance with dental resources- insurance plan does not cover dental; Review of patient status, including review of consultants reports, relevant laboratory and other test results, and medications completed; Reviewed recent fasting blood sugars: reports consistent values at home between 70-120; reports general ranges between 86-100; reports today's fasting at "87" Discussed signs/ symptoms hypoglycemia: she denies signs/ symptoms- even when her blood sugars are as low as 70; reports she eats something after checking daily fasting blood sugars Reviewed current dietary strategies for self- management of DMII: patient currently limits carbohydrates, sugar, and portions; also follows general heart healthy, low salt diet- positive reinforcement provided with encouragement to continue efforts Confirmed no current medication concerns; independently self- manages, does not use pill box, has her own organization system at home that works for her Reviewed recent PCP office visit with patient 11/04/20: confirms she is taking new doses of atorvastatin and glipizide Discussed role of activity in setting of HLD/ DMII- patient active at baseline but does not get structured exercise regularly: benefits of structured exercise and activity discussed Discussed/ provided education around cardiovascular risks/ complications  of high blood sugar in setting of HLD  Lab Results  Component Value Date   HGBA1C 6.2 11/04/2020  Patient Goals/Self-Care Activities: As evidenced by review of EHR, collaboration with care team, and patient reporting during CCM RN CM outreach,  Patient Stacey [name] will: Take medications as prescribed Attend all scheduled provider appointments Call pharmacy for medication refills Call provider office for new concerns or questions- please call Dr. [name] to schedule an urgent office visit appointment if your reported symptoms of  sinus congestion do not improve Continue to check fasting (first thing in the morning, before eating) blood sugars at home Continue to follow heart healthy, low salt, low cholesterol, carbohydrate-modified, low sugar diet Review enclosed educational material around food choices in setting of HLD Talk to the [place] to obtain possible resources available for dental provider services Continue efforts to schedule annual eye exam  Follow Up Plan:   Telephone follow up appointment with care management team member scheduled for:  Tuesday, March 02, 2021 at 11:30 am The patient has been provided with contact information for the care management team and has been advised to call with any health related questions or concerns     Plan: The patient has been provided with contact information for the care management team and has been advised to call with any health related questions or concerns   [name], RN, BSN, [place] [phone]: direct office

## 2021-01-04 NOTE — Patient Instructions (Signed)
Visit Information  Stacey [name], thank you for taking time to talk with me today. Please don't hesitate to contact me if I can be of assistance to you before our next scheduled telephone appointment.  Below are the goals we discussed today:  Patient Self-Care Activities: Patient Stacey [name] will: Take medications as prescribed Attend all scheduled provider appointments Call pharmacy for medication refills Call provider office for new concerns or questions- please call Dr. Jonny Ruiz to schedule an urgent office visit appointment if your reported symptoms of sinus congestion do not improve Continue to check fasting (first thing in the morning, before eating) blood sugars at home Continue to follow heart healthy, low salt, low cholesterol, carbohydrate-modified, low sugar diet Review enclosed educational material around food choices in setting of HLD Talk to the [facility] Guide to obtain possible resources available for dental provider services Continue efforts to schedule annual eye exam  Our next scheduled telephone follow up visit/ appointment with care management team member is scheduled on:  , Tuesday, March 02, 2021 at 11:30 am  If you need to cancel or re-schedule our visit, please call [phone] and our care guide team will be happy to assist you.   I look forward to hearing about your progress.   Caryl Pina, RN, BSN, CCRN [facility]umnus [facility] RN Care Coordination- [facility] [phone]: direct office  If you are experiencing a Mental Health or Behavioral Health Crisis or need someone to talk to, please  call the Suicide and Crisis Lifeline: 988 call the [place] National Suicide Prevention Lifeline: 703-876-5265 or TTY: 217-145-3097 TTY 205-020-7282) to talk to a trained counselor call 1-800-273-TALK (toll free, 24 hour hotline) go to [facility] [address], [place] [phone]) call 911   Patient  verbalizes understanding of instructions provided today and agrees to view in MyChart.  Sinusitis, Adult Sinusitis is soreness and swelling (inflammation) of your sinuses. Sinuses are hollow spaces in the bones around your face. They are located: Around your eyes. In the middle of your forehead. Behind your nose. In your cheekbones. Your sinuses and nasal passages are lined with a fluid called mucus. Mucus drains out of your sinuses. Swelling can trap mucus in your sinuses. This lets germs (bacteria, virus, or fungus) grow, which leads to infection. Most of the time, this condition is caused by a virus. What are the causes? This condition is caused by: Allergies. Asthma. Germs. Things that block your nose or sinuses. Growths in the nose (nasal polyps). Chemicals or irritants in the air. Fungus (rare). What increases the risk? You are more likely to develop this condition if: You have a weak body defense system (immune system). You do a lot of swimming or diving. You use nasal sprays too much. You smoke. What are the signs or symptoms? The main symptoms of this condition are pain and a feeling of pressure around the sinuses. Other symptoms include: Stuffy nose (congestion). Runny nose (drainage). Swelling and warmth in the sinuses. Headache. Toothache. A cough that may get worse at night. Mucus that collects in the throat or the back of the nose (postnasal drip). Being unable to smell and taste. Being very tired (fatigue). A fever. Sore throat. Bad breath. How is this diagnosed? This condition is diagnosed based on: Your symptoms. Your medical history. A physical exam. Tests to find out if your condition is short-term (acute) or long-term (chronic). Your doctor may: Check your nose for growths (polyps). Check your sinuses using a  tool that has a light (endoscope). Check for allergies or germs. Do imaging tests, such as an MRI or CT scan. How is this treated? Treatment  for this condition depends on the cause and whether it is short-term or long-term. If caused by a virus, your symptoms should go away on their own within 10 days. You may be given medicines to relieve symptoms. They include: Medicines that shrink swollen tissue in the nose. Medicines that treat allergies (antihistamines). A spray that treats swelling of the nostrils.  Rinses that help get rid of thick mucus in your nose (nasal saline washes). If caused by bacteria, your doctor may wait to see if you will get better without treatment. You may be given antibiotic medicine if you have: A very bad infection. A weak body defense system. If caused by growths in the nose, you may need to have surgery. Follow these instructions at home: Medicines Take, use, or apply over-the-counter and prescription medicines only as told by your doctor. These may include nasal sprays. If you were prescribed an antibiotic medicine, take it as told by your doctor. Do not stop taking the antibiotic even if you start to feel better. Hydrate and humidify  Drink enough water to keep your pee (urine) pale yellow. Use a cool mist humidifier to keep the humidity level in your home above 50%. Breathe in steam for 10-15 minutes, 3-4 times a day, or as told by your doctor. You can do this in the bathroom while a hot shower is running. Try not to spend time in cool or dry air. Rest Rest as much as you can. Sleep with your head raised (elevated). Make sure you get enough sleep each night. General instructions  Put a warm, moist washcloth on your face 3-4 times a day, or as often as told by your doctor. This will help with discomfort. Wash your hands often with soap and water. If there is no soap and water, use hand sanitizer. Do not smoke. Avoid being around people who are smoking (secondhand smoke). Keep all follow-up visits as told by your doctor. This is important. Contact a doctor if: You have a fever. Your symptoms  get worse. Your symptoms do not get better within 10 days. Get help right away if: You have a very bad headache. You cannot stop throwing up (vomiting). You have very bad pain or swelling around your face or eyes. You have trouble seeing. You feel confused. Your neck is stiff. You have trouble breathing. Summary Sinusitis is swelling of your sinuses. Sinuses are hollow spaces in the bones around your face. This condition is caused by tissues in your nose that become inflamed or swollen. This traps germs. These can lead to infection. If you were prescribed an antibiotic medicine, take it as told by your doctor. Do not stop taking it even if you start to feel better. Keep all follow-up visits as told by your doctor. This is important. This information is not intended to replace advice given to you by your health care provider. Make sure you discuss any questions you have with your health care provider. Document Revised: 06/12/2017 Document Reviewed: 06/12/2017 Elsevier Patient Education  2022 [employer].

## 2021-01-05 ENCOUNTER — Telehealth (INDEPENDENT_AMBULATORY_CARE_PROVIDER_SITE_OTHER): Payer: HMO | Admitting: Family Medicine

## 2021-01-05 DIAGNOSIS — R059 Cough, unspecified: Secondary | ICD-10-CM

## 2021-01-05 DIAGNOSIS — R0981 Nasal congestion: Secondary | ICD-10-CM

## 2021-01-05 LAB — CUP PACEART REMOTE DEVICE CHECK
Battery Remaining Longevity: 41 mo
Battery Voltage: 2.97 V
Brady Statistic AP VP Percent: 17.62 %
Brady Statistic AP VS Percent: 0 %
Brady Statistic AS VP Percent: 82.32 %
Brady Statistic AS VS Percent: 0.07 %
Brady Statistic RA Percent Paced: 17.57 %
Brady Statistic RV Percent Paced: 99.74 %
Date Time Interrogation Session: 20221213113840
Implantable Lead Implant Date: 20160203
Implantable Lead Implant Date: 20160203
Implantable Lead Location: 753859
Implantable Lead Location: 753860
Implantable Lead Model: 5076
Implantable Lead Model: 5076
Implantable Pulse Generator Implant Date: 20160203
Lead Channel Impedance Value: 342 Ohm
Lead Channel Impedance Value: 418 Ohm
Lead Channel Impedance Value: 456 Ohm
Lead Channel Impedance Value: 456 Ohm
Lead Channel Pacing Threshold Amplitude: 0.625 V
Lead Channel Pacing Threshold Amplitude: 1 V
Lead Channel Pacing Threshold Pulse Width: 0.4 ms
Lead Channel Pacing Threshold Pulse Width: 0.4 ms
Lead Channel Sensing Intrinsic Amplitude: 2.625 mV
Lead Channel Sensing Intrinsic Amplitude: 2.625 mV
Lead Channel Sensing Intrinsic Amplitude: 7.75 mV
Lead Channel Sensing Intrinsic Amplitude: 7.75 mV
Lead Channel Setting Pacing Amplitude: 2 V
Lead Channel Setting Pacing Amplitude: 2.5 V
Lead Channel Setting Pacing Pulse Width: 0.4 ms
Lead Channel Setting Sensing Sensitivity: 2 mV

## 2021-01-05 MED ORDER — BENZONATATE 100 MG PO CAPS: ORAL_CAPSULE | ORAL | 0 refills | Status: DC

## 2021-01-05 NOTE — Progress Notes (Signed)
Virtual Visit via Video Note  I connected with Stacey [name]  on 01/05/21 at  1:20 PM EST by a video enabled telemedicine application and verified that I am speaking with the correct person using two identifiers.  Location patient: home, [place] Location provider:work or home office Persons participating in the virtual visit: patient, provider  I discussed the limitations of evaluation and management by telemedicine and the availability of in person appointments. The patient expressed understanding and agreed to proceed.   HPI:  Acute telemedicine visit for cough and congestion: -Onset: about 5 days ago -Symptoms include: sore throat, hoarseness, cough, HA, sinus congestion -Denies: fever, SOB, cp except when coughing, vomiting, diarrhea, asthma symptoms or wheezing -inability to eat, drink get out of bed -no known sick contacts -2 covid tests a few days apart negative -Pertinent past medical history: see below -Pertinent medication allergies:  Allergies  Allergen Reactions   Dairy Aid [Tilactase] Diarrhea and Other (See Comments)    Bloating and gastric distress   Metformin And Related Other (See Comments)    GI upset   Ace Inhibitors Other (See Comments)    REACTION: angioedema right eye   Aspirin Other (See Comments)    Bleeding    Codeine Other (See Comments)    Hallucinations, can take if with someone.   Soy Allergy Other (See Comments)    Bleeding & cramps, diarrhea   -COVID-19 vaccine status:  Immunization History  Administered Date(s) Administered   Fluad Quad(high Dose 65+) 11/04/2020   Influenza Whole 11/10/2008, 10/13/2009   Influenza, High Dose Seasonal PF 10/26/2016, 10/27/2017   Influenza,inj,Quad PF,6+ Mos 10/23/2013   Influenza-Unspecified 12/04/2014, 11/05/2019   PFIZER(Purple Top)SARS-COV-2 Vaccination 04/29/2019, 05/20/2019, 11/19/2019, 05/22/2020   Pfizer Covid-19 Vaccine Bivalent Booster 45yrs & up 10/16/2020   Pneumococcal Conjugate-13 01/10/2017    Pneumococcal Polysaccharide-23 09/29/2005, 08/30/2010, 01/28/2014, 05/05/2020   Td 01/24/1993, 12/10/2008   Tdap 04/14/2020    ROS: See pertinent positives and negatives per HPI.  Past Medical History:  Diagnosis Date   ANGIOEDEMA 03/07/2008   a. with ACE-I   ANXIETY 09/16/2006   ASTHMA 08/01/2006   CAD (coronary artery disease)    a. 01/2010 : Minimal plaque at cardiac catheterization - CFX 20%, EF 70%;  b. 08/2012 Cath: Nl Cors, EF 65%.   Chronic diastolic CHF (congestive heart failure) (HCC) 04/06/2010   a. In setting of HOCM.   Complete heart block (HCC) 02/26/2014   a. 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 s/p septal myomectomy 4/12 at [facility] with Dr. Silvestre Mesi;  b. echo 5/12: EF 60-65%, LVOT peak 18 mmHg; grade 1 diast dysfxn, mild SAM (improved since myomectomy;  c. 03/2012 Echo: EF 60-65%, mod-sev basal septal asymm hypertrophy, basal septal HK, LVOT grad , Gr 1 DD, , SAM, Mild MR, nl RV, PASP ; 08/2012 Echo: technically difficult, doubt LVOT obstruction, EF 60%, Gr 1 DD, mod-sev dil LA.   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HCC)    SICKLE CELL ANEMIA 08/01/2006   Type II or unspecified type diabetes mellitus without mention of complication, uncontrolled 08/30/2010   VENTRAL HERNIA 12/07/2006    Past Surgical History:  Procedure Laterality Date   ABDOMINAL HYSTERECTOMY  1987   CARDIAC SURGERY     COLONOSCOPY N/A 12/26/2016   Procedure: COLONOSCOPY;  Surgeon: Milus Banister, MD;  Location: [facility] ENDOSCOPY;  Service: Endoscopy;  Laterality: N/A;   ELECTROPHYSIOLOGY STUDY N/A 09/12/2012   Procedure:  ELECTROPHYSIOLOGY STUDY;  Surgeon: Deboraha Sprang, MD;  Location: [facility] CATH LAB;  Service: Cardiovascular;  Laterality: N/A;   LEFT HEART CATH  Aug. 18, 2014   Medtronic heart device   LEFT HEART CATHETERIZATION WITH CORONARY ANGIOGRAM N/A 09/10/2012   Procedure: LEFT HEART CATHETERIZATION WITH CORONARY ANGIOGRAM;  Surgeon: Burnell Blanks, MD;  Location: [facility] CATH LAB;  Service: Cardiovascular;  Laterality: N/A;   MYOMECTOMY     Septal   PERMANENT PACEMAKER INSERTION N/A 02/26/2014   MDT Advisa dual chamber pacemaker implanted by Dr Caryl Comes for heart block   VENTRAL HERNIA REPAIR  06/2006     Current Outpatient Medications:    benzonatate (TESSALON PERLES) 100 MG capsule, 1-2 capsules up to twice daily as needed, Disp: 40 capsule, Rfl: 0   acetaminophen (TYLENOL) 500 MG tablet, Take 500 mg by mouth daily as needed., Disp: , Rfl:    albuterol (VENTOLIN HFA) 108 (90 Base) MCG/ACT inhaler, Inhale 2 puffs into the lungs every 6 (six) hours as needed for wheezing or shortness of breath., Disp: 18 g, Rfl: 5   amLODipine (NORVASC) 10 MG tablet, Take 1 tablet (10 mg total) by mouth daily., Disp: 90 tablet, Rfl: 1   atorvastatin (LIPITOR) 80 MG tablet, Take 1 tablet (80 mg total) by mouth daily., Disp: 90 tablet, Rfl: 3   Blood Glucose Monitoring Suppl (ONE TOUCH ULTRA 2) W/DEVICE KIT, Use to check blood sugars daily Dx E11.9, Disp: 1 each, Rfl: 0   cetirizine (ZYRTEC) 10 MG tablet, Take 10 mg by mouth daily., Disp: , Rfl:    clonazePAM (KLONOPIN) 0.5 MG tablet, TAKE 1 TABLET BY MOUTH TWICE DAILY AS NEEDED FOR ANXIETY, Disp: 30 tablet, Rfl: 2   dicyclomine (BENTYL) 20 MG tablet, Take 1 tablet (20 mg total) by mouth 3 (three) times daily between meals as needed for spasms., Disp: 60 tablet, Rfl: 3   Dulaglutide (TRULICITY) 1.5 UX/3.2GM SOPN, Inject 1.5 mg into the skin once a week., Disp: 6 mL, Rfl: 3   fenofibrate (TRICOR) 145 MG tablet, TAKE 1 TABLET BY MOUTH DAILY, Disp: 90 tablet, Rfl: 3   glipiZIDE  (GLUCOTROL XL) 5 MG 24 hr tablet, Take 1 tablet (5 mg total) by mouth daily with breakfast., Disp: 90 tablet, Rfl: 3   glucose blood test strip, 1 each by Other route daily. Use to check blood sugar daily Dx e11.9, Disp: 100 each, Rfl: 11   hydrochlorothiazide (MICROZIDE) 12.5 MG capsule, Take 1 capsule (12.5 mg total) by mouth daily., Disp: 90 capsule, Rfl: 1   hydrocortisone (ANUSOL-HC) 2.5 % rectal cream, Place 1 application rectally as needed for hemorrhoids or anal itching., Disp: , Rfl:    losartan (COZAAR) 100 MG tablet, TAKE 1 TABLET BY MOUTH DAILY, Disp: 90 tablet, Rfl: 1   metoprolol succinate (TOPROL-XL) 100 MG 24 hr tablet, TAKE 1 TABLET(100 MG) BY MOUTH DAILY WITH OR IMMEDIATELY FOLLOWING A MEAL, Disp: 90 tablet, Rfl: 3   ondansetron (ZOFRAN ODT) 4 MG disintegrating tablet, Take 1 tablet (4 mg total) by mouth every 8 (eight) hours as needed for nausea., Disp: 10 tablet, Rfl: 0   ONETOUCH DELICA LANCETS 01U MISC, USE TO HELP CHECK BLOOD SUGAR DAILY, Disp: 200  each, Rfl: 0   oxcarbazepine (TRILEPTAL) 600 MG tablet, TAKE 1 TABLET(600 MG) BY MOUTH TWICE DAILY, Disp: 180 tablet, Rfl: 1   pantoprazole (PROTONIX) 40 MG tablet, TAKE 1 TABLET BY MOUTH EVERY DAY, Disp: 90 tablet, Rfl: 3   pioglitazone (ACTOS) 45 MG tablet, TAKE 1 TABLET BY MOUTH DAILY, Disp: 90 tablet, Rfl: 2   potassium chloride (KLOR-CON) 10 MEQ tablet, TAKE 2 TABLETS BY MOUTH DAILY, Disp: 180 tablet, Rfl: 1   prochlorperazine (COMPAZINE) 10 MG tablet, Take 1 tablet (10 mg total) by mouth every 6 (six) hours as needed for nausea or vomiting., Disp: 30 tablet, Rfl: 1   topiramate (TOPAMAX) 100 MG tablet, TAKE 1 TABLET(100 MG) BY MOUTH DAILY, Disp: 90 tablet, Rfl: 1  EXAM:  VITALS per patient if applicable:  GENERAL: alert, oriented, appears well and in no acute distress  HEENT: atraumatic, conjunttiva clear, no obvious abnormalities on inspection of external nose and ears  NECK: normal movements of the head and  neck  LUNGS: on inspection no signs of respiratory distress, breathing rate appears normal, no obvious gross SOB, gasping or wheezing  CV: no obvious cyanosis  MS: moves all visible extremities without noticeable abnormality  PSYCH/NEURO: pleasant and cooperative, no obvious depression or anxiety, speech and thought processing grossly intact  ASSESSMENT AND PLAN:  Discussed the following assessment and plan:  Cough, unspecified type  Nasal congestion  -we discussed possible serious and likely etiologies, options for evaluation and workup, limitations of telemedicine visit vs in person visit, treatment, treatment risks and precautions. Pt is agreeable to treatment via telemedicine at this moment. Likely viral resp illness vs other. She has opted for Tessalon rx for cough and other measures per patient instruction. Alb prn - denies any need for alb at this time.  Advised to seek prompt vv follow up or in person care if worsening, new symptoms arise, or if is not improving with treatment. Discussed options for inperson care if PCP office not available. Did let this patient know that I only do telemedicine on Tuesdays and Thursdays for [place]. Advised to schedule follow up visit with PCP or [facility] if any further questions or concerns to avoid delays in care.   I discussed the assessment and treatment plan with the patient. The patient was provided an opportunity to ask questions and all were answered. The patient agreed with the plan and demonstrated an understanding of the instructions.     Lucretia Kern, DO

## 2021-01-05 NOTE — Patient Instructions (Signed)
°  HOME CARE TIPS:   -I sent the medication(s) we discussed to your pharmacy: Meds ordered this encounter  Medications   benzonatate (TESSALON PERLES) 100 MG capsule    Sig: 1-2 capsules up to twice daily as needed    Dispense:  40 capsule    Refill:  0     -can use tylenol  if needed for fevers, aches and pains per instructions  -can use nasal saline a few times per day if you have nasal congestion  -a humidifier or steam can sometimes help  -stay hydrated, drink plenty of fluids and eat small healthy meals - avoid dairy  -can take 1000 IU ( ) Vit D3 and 100-500 mg of Vit C daily per instructions  -follow up with your doctor in 2-3 days unless improving and feeling better  -stay home while sick, except to seek medical care. If you have COVID19, you will likely be contagious for 7-10 days. Flu or Influenza is likely contagious for about 7 days. Other respiratory viral infections remain contagious for 5-10+ days depending on the virus and many other factors. Wear a good mask that fits snugly (such as N95 or KN95) if around others to reduce the risk of transmission.  It was nice to meet you today, and I really hope you are feeling better soon. I help  out with telemedicine visits on Tuesdays and Thursdays and am happy to help if you need a follow up virtual visit on those days. Otherwise, if you have any concerns or questions following this visit please schedule a follow up visit with your Primary Care doctor or seek care at a local urgent care clinic to avoid delays in care.    Seek in person care or schedule a follow up video visit promptly if your symptoms worsen, new concerns arise or you are not improving with treatment. Call 911 and/or seek emergency care if your symptoms are severe or life threatening.

## 2021-01-12 NOTE — Progress Notes (Signed)
Remote pacemaker transmission.   

## 2021-01-13 ENCOUNTER — Telehealth: Payer: Self-pay

## 2021-01-13 NOTE — Telephone Encounter (Signed)
° °  Telephone encounter was:  Successful.  01/13/2021 Name: Stacey [name] MRN: [id number] DOB: Jan 09, 1951  [name] is a 70 y.o. year old female who is a primary care patient of Jonny Ruiz, Len Blalock, MD . The community resource team was consulted for assistance with  dental provider list.  Care guide performed the following interventions: Spoke with patient she has contacted [facility] and they are sending a list of approved dentist. The dentist she wanted is not covered so she will choose another dentist from the list once she receives it.  Follow Up Plan:  No further follow up planned at this time. The patient has been provided with needed resources.  Jennie Bolar, AAS Paralegal, [facility] Care Guide  Embedded Care Coordination [place]   Care Management  [address] [place], [place] [address] ??[email or web address]   ?? [phone]   [email or web address]

## 2021-01-26 ENCOUNTER — Other Ambulatory Visit: Payer: Self-pay | Admitting: Internal Medicine

## 2021-01-26 NOTE — Telephone Encounter (Signed)
Please refill as per office routine med refill policy (all routine meds to be refilled for 3 mo or monthly (per pt preference) up to one year from last visit, then month to month grace period for 3 mo, then further med refills will have to be denied) ? ?

## 2021-02-04 ENCOUNTER — Other Ambulatory Visit: Payer: Self-pay | Admitting: Internal Medicine

## 2021-02-04 DIAGNOSIS — Z1231 Encounter for screening mammogram for malignant neoplasm of breast: Secondary | ICD-10-CM

## 2021-02-08 ENCOUNTER — Telehealth: Payer: Self-pay

## 2021-02-08 NOTE — Telephone Encounter (Signed)
Error

## 2021-02-13 ENCOUNTER — Other Ambulatory Visit: Payer: Self-pay | Admitting: Internal Medicine

## 2021-02-28 ENCOUNTER — Other Ambulatory Visit: Payer: Self-pay | Admitting: Internal Medicine

## 2021-02-28 NOTE — Telephone Encounter (Signed)
Please refill as per office routine med refill policy (all routine meds to be refilled for 3 mo or monthly (per pt preference) up to one year from last visit, then month to month grace period for 3 mo, then further med refills will have to be denied) ? ?

## 2021-03-02 ENCOUNTER — Ambulatory Visit (INDEPENDENT_AMBULATORY_CARE_PROVIDER_SITE_OTHER): Payer: HMO | Admitting: *Deleted

## 2021-03-02 DIAGNOSIS — E1165 Type 2 diabetes mellitus with hyperglycemia: Secondary | ICD-10-CM

## 2021-03-02 DIAGNOSIS — E7800 Pure hypercholesterolemia, unspecified: Secondary | ICD-10-CM

## 2021-03-02 NOTE — Chronic Care Management (AMB) (Signed)
Chronic Care Management   CCM RN Visit Note  03/02/2021 Name: Stacey [name] MRN: [id number] DOB: April 30, 1950  Subjective: Stacey [name] is a 71 y.o. year old female who is a primary care patient of Biagio Borg, MD. The care management team was consulted for assistance with disease management and care coordination needs.    Engaged with patient by telephone for follow up visit in response to provider referral for case management and/or care coordination services.   Consent to Services:  The patient was given information about Chronic Care Management services, agreed to services, and gave verbal consent prior to initiation of services.  Please see initial visit note for detailed documentation.  Patient agreed to services and verbal consent obtained.   Assessment: Review of patient past medical history, allergies, medications, health status, including review of consultants reports, laboratory and other test data, was performed as part of comprehensive evaluation and provision of chronic care management services.   SDOH (Social Determinants of Health) assessments and interventions performed:  SDOH Interventions    Flowsheet Row Most Recent Value  SDOH Interventions   Food Insecurity Interventions Intervention Not Indicated  [continues to deny food insecurity]  Transportation Interventions Intervention Not Indicated  [Continues to drive self]        CCM Care Plan  Allergies  Allergen Reactions   Dairy Aid [Tilactase] Diarrhea and Other (See Comments)    Bloating and gastric distress   Metformin And Related Other (See Comments)    GI upset   Ace Inhibitors Other (See Comments)    REACTION: angioedema right eye   Aspirin Other (See Comments)    Bleeding    Codeine Other (See Comments)    Hallucinations, can take if with someone.   Soy Allergy Other (See Comments)    Bleeding & cramps, diarrhea    Outpatient Encounter Medications as of 03/02/2021   Medication Sig   amLODipine (NORVASC) 10 MG tablet TAKE 1 TABLET(10 MG) BY MOUTH DAILY   losartan (COZAAR) 100 MG tablet TAKE 1 TABLET BY MOUTH DAILY   acetaminophen (TYLENOL) 500 MG tablet Take 500 mg by mouth daily as needed.   albuterol (VENTOLIN HFA) 108 (90 Base) MCG/ACT inhaler Inhale 2 puffs into the lungs every 6 (six) hours as needed for wheezing or shortness of breath.   atorvastatin (LIPITOR) 80 MG tablet Take 1 tablet (80 mg total) by mouth daily.   benzonatate (TESSALON PERLES) 100 MG capsule 1-2 capsules up to twice daily as needed   Blood Glucose Monitoring Suppl (ONE TOUCH ULTRA 2) W/DEVICE KIT Use to check blood sugars daily Dx E11.9   cetirizine (ZYRTEC) 10 MG tablet Take 10 mg by mouth daily.   clonazePAM (KLONOPIN) 0.5 MG tablet TAKE 1 TABLET BY MOUTH TWICE DAILY AS NEEDED FOR ANXIETY   dicyclomine (BENTYL) 20 MG tablet Take 1 tablet (20 mg total) by mouth 3 (three) times daily between meals as needed for spasms.   Dulaglutide (TRULICITY) 1.5 UM/3.5TI SOPN Inject 1.5 mg into the skin once a week.   fenofibrate (TRICOR) 145 MG tablet TAKE 1 TABLET BY MOUTH DAILY   glipiZIDE (GLUCOTROL XL) 5 MG 24 hr tablet Take 1 tablet (5 mg total) by mouth daily with breakfast.   glucose blood test strip 1 each by Other route daily. Use to check blood sugar daily Dx e11.9   hydrochlorothiazide (MICROZIDE) 12.5 MG capsule Take 1 capsule (12.5 mg total) by mouth daily.   hydrocortisone (ANUSOL-HC) 2.5 % rectal cream Place 1  application rectally as needed for hemorrhoids or anal itching.   metoprolol succinate (TOPROL-XL) 100 MG 24 hr tablet TAKE 1 TABLET(100 MG) BY MOUTH DAILY WITH OR IMMEDIATELY FOLLOWING A MEAL   ondansetron (ZOFRAN ODT) 4 MG disintegrating tablet Take 1 tablet (4 mg total) by mouth every 8 (eight) hours as needed for nausea.   ONETOUCH DELICA LANCETS 48G MISC USE TO HELP CHECK BLOOD SUGAR DAILY   oxcarbazepine (TRILEPTAL) 600 MG tablet TAKE 1 TABLET(600 MG) BY MOUTH  TWICE DAILY   pantoprazole (PROTONIX) 40 MG tablet TAKE 1 TABLET BY MOUTH EVERY DAY   pioglitazone (ACTOS) 45 MG tablet TAKE 1 TABLET BY MOUTH DAILY   potassium chloride (KLOR-CON) 10 MEQ tablet TAKE 2 TABLETS BY MOUTH DAILY   prochlorperazine (COMPAZINE) 10 MG tablet Take 1 tablet (10 mg total) by mouth every 6 (six) hours as needed for nausea or vomiting.   topiramate (TOPAMAX) 100 MG tablet TAKE 1 TABLET(100 MG) BY MOUTH DAILY   No facility-administered encounter medications on file as of 03/02/2021.   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   Hematochezia    Acute blood loss anemia    Fibrinogen decreased (HCC)    Lower GI bleed 12/21/2016   Acute gastroenteritis 12/31/2015   N&V (nausea and vomiting) 09/29/2015   Diarrhea 09/29/2015   Abdominal mass, RLQ (right lower quadrant) 09/04/2015   Preventative health care 09/04/2015   Wheezing 11/14/2014   S/P myomectomy 04/08/2014   Complete heart block ([place]) 02/26/2014   LINQ recorder  02/26/2014   Pacemaker-Medtronic  MRI compatible 02/26/2014   Nocturnal hypoxemia 01/30/2014   Pancreatitis 10/22/2013   Odynophagia 10/22/2013   Chest pain syndrome 10/21/2013   Syncope 12/18/2012   Loop recorder-LINQ 12/18/2012   Palpitations 09/12/2012   Midsternal chest pain 09/12/2012   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   Abdominal pain 07/24/2012   Polyarthralgia 07/24/2012   CAD (coronary artery disease) 05/04/2012   COPD (chronic obstructive pulmonary disease) ([place]) 05/04/2012   Angioedema 03/04/2012   Varicose veins of lower extremities with other complications 89/16/9450   Abnormal liver function test 09/07/2011   Renal  artery aneurysm ([place]) 09/07/2011   Trigeminal neuralgia 08/15/2011   Vertigo 07/27/2011   Diabetes ([place]) 08/30/2010   Encounter for long-term (current) use of high-risk medication 08/30/2010   Status post myomectomy 06/17/2010   LBBB (left bundle branch block) 38/88/2800   Diastolic heart failure ([place]) 04/06/2010   Diverticulosis of large intestine 04/06/2010   Diverticulosis 04/06/2010   RECTAL BLEEDING 04/06/2010   BRADYCARDIA 10/13/2009   ALLERGIC RHINITIS 12/07/2006   VENTRAL HERNIA 12/07/2006   COLONIC POLYPS, HX OF 12/07/2006   OBESITY 09/16/2006   ANXIETY 09/16/2006   DEPRESSION 09/16/2006   Hyperlipidemia 08/01/2006   Sickle cell anemia ([place]) 08/01/2006   Essential hypertension 08/01/2006   Hypertrophic obstructive cardiomyopathy ([place]) 08/01/2006   Asthma 08/01/2006   Conditions to be addressed/monitored:  HTN, HLD, and DMII  Care Plan : RN Care Manager Plan of Care  Updates made by Stacey Royalty, RN since 03/02/2021 12:00 AM     Problem: Chronic Disease Management Needs   Priority: High     Long-Range Goal: Ongoing adherence to  established Plan of Care for long term chronic disease management   Start Date: 12/08/2020  Expected End Date: 12/08/2021  Priority: High  Note:   Current Barriers:  Chronic Disease Management support and education needs related to HLD and DMII  RNCM Clinical Goal(s):  Patient will demonstrate Ongoing health management independence DMII; HLD  through collaboration with RN Care manager, provider, and care team.   Interventions: 1:1 collaboration with primary care provider regarding development and update of comprehensive plan of care as evidenced by provider attestation and co-signature Inter-disciplinary care team collaboration (see longitudinal plan of care) Evaluation of current treatment plan related to  self management and patient's adherence to plan as established by provider SDOH updated: no new/ unmet needs identified Falls  assessment updated: continues to deny falls x 12 months; does not use assistive devices; positive reinforcement provided with encouragement to continue efforts Pain assessment updated: denies acute/ chronic pain Reviewed with patient recent video visit 01/05/21 post last (acute) outreach on 01/04/21: reports complete resolution of previously reported symptoms of nasal congestion, cough, sinus drainage Discussed medications: patient confirms that she remains adherent to prescribed medication regimen and denies medication concerns/ issues/ problems; confirms she continues to self manage medications  Hyperlipidemia Interventions: GOAL STATUS: 03/02/21- Goal on track/ progressing; Long Term goal Counseled on importance of regular laboratory monitoring as prescribed Reviewed role and benefits of statin for ASCVD risk reduction Reviewed importance of limiting foods high in cholesterol Reviewed exercise goals and target of 150 minutes per week Confirmed patient does not routinely monitor blood pressures at home  Diabetes Interventions: GOAL STATUS: 03/02/21- Goal on track/ progressing; Long Term goal Counseled on importance of regular laboratory monitoring as prescribed Discussed plans with patient for ongoing care management follow up and provided patient with direct contact information for care management team Reviewed scheduled/upcoming provider appointments including: again encouraged patient to continue efforts to schedule annual eye exam; 03/22/21- mammogram screening; 05/11/21- PCP office visit Reviewed recent fasting blood sugars: reports consistent values at home between 74-120; reports general ranges between 90-110; reports today's fasting at "105" Discussed signs/ symptoms hypoglycemia: she denies signs/ symptoms- even when her blood sugars are as low as 70; reports she eats something after checking daily fasting blood sugars; reports only one isolated value at home of "74" since our last  outreach Reviewed current dietary strategies for self- management of DMII: patient continues to limit carbohydrates, sugar, and portions; also follows general heart healthy, low salt diet- positive reinforcement provided with encouragement to continue efforts Previously provided education around role of activity in setting of HLD/ DMII reinforced- patient active at baseline but does not get structured exercise regularly: benefits of structured exercise and activity discussed  Lab Results  Component Value Date   HGBA1C 6.2 11/04/2020  Patient Goals/Self-Care Activities: As evidenced by review of EHR, collaboration with care team, and patient reporting during CCM RN CM outreach,  Patient [place] will: Take medications as prescribed Attend all scheduled provider appointments Call pharmacy for medication refills Call provider office for new concerns or questions Continue to check fasting (first thing in the morning, before eating) blood sugars at home- the blood sugar values we reviewed today are right on track where they should be Continue to follow heart healthy, low salt, low cholesterol, carbohydrate-modified, low sugar diet Keep up the great work preventing falls! Continue efforts to schedule annual eye exam Follow Up Plan:   Telephone follow up appointment with care management team member scheduled for:  Thursday May 13, 2021, at 11:30 am The patient has been provided with contact information for the care management team and has been advised to call with any health related questions or concerns     Plan: The patient has been provided with contact information for the care management team and has been advised to call with any health related questions or concerns   [name], RN, BSN, [place] [phone]: direct office

## 2021-03-02 NOTE — Patient Instructions (Signed)
Visit [place], thank you for taking time to talk with me today. Please don't hesitate to contact me if I can be of assistance to you before our next scheduled telephone appointment.  Below are the goals we discussed today:  Patient Self-Care Activities: Patient [place] will: Take medications as prescribed Attend all scheduled provider appointments Call pharmacy for medication refills Call provider office for new concerns or questions Continue to check fasting (first thing in the morning, before eating) blood sugars at home- the blood sugar values we reviewed today are right on track where they should be Continue to follow heart healthy, low salt, low cholesterol, carbohydrate-modified, low sugar diet Keep up the great work preventing falls! Continue efforts to schedule annual eye exam  Our next scheduled telephone follow up visit/ appointment with care management team member is scheduled on:   Thursday, May 13, 2021 at 11:30 am- This is a PHONE CALL appointment  If you need to cancel or re-schedule our visit, please call [phone] and our care guide team will be happy to assist you.   I look forward to hearing about your progress.   [name], RN, BSN, [place] [phone]: direct office  If you are experiencing a Mental Health or [place] or need someone to talk to, please  call the Suicide and Crisis Lifeline: 988 call the [place] National Suicide Prevention Lifeline: 5197971244 or TTY: 253-459-6833 TTY 6016901949) to talk to a trained counselor call 1-800-273-TALK (toll free, 24 hour hotline) go to [facility] [address], [place] [id number]) call 911   Patient verbalizes understanding of instructions and care plan provided today and agrees to view in [place]. Active MyChart status confirmed with patient  Living With  Diabetes Diabetes (type 1 diabetes mellitus or type 2 diabetes mellitus) is a condition in which the body does not have enough of a hormone called insulin, or the body does not respond properly to insulin. Normally, insulin allows sugars (glucose) to enter cells in the body. With diabetes, extra glucose builds up in the blood instead of going into cells. This results in high blood glucose (hyperglycemia). How to manage lifestyle changes Managing diabetes includes medical treatments as well as lifestyle changes. If diabetes is not managed well, serious physical and emotional complications can occur. Taking good care of yourself means that you are responsible for: Monitoring glucose regularly. Eating a healthy diet. Exercising regularly. Meeting with health care providers. Taking medicines as directed. Most people feel some stress about managing their diabetes. When this stress becomes too much, it is known as diabetes-related distress. This is very common. Living with diabetes can place you at risk for diabetes distress, depression, or anxiety. These disorders can make diabetes more difficult to manage. How to recognize stress You may have diabetes distress if you: Avoid or ignore your daily diabetes care. This includes glucose testing, following a meal plan, and taking medications. Feel overwhelmed by your daily diabetes care. Experience emotional reactions such as anger, sadness, or fear related to your daily diabetes care. Feel fear or shame about not doing everything perfectly that you have been told to do. Emotional distress Symptoms of diabetes distress include: Anger about having a diagnosis of diabetes. Fear or frustration about your diagnosis and the changes you need to make to manage the condition. Being overly worried about the care that you need or the cost of the care that you need.  Feeling like you caused your condition by doing something wrong. Fear about unpredictable  fluctuations in your blood glucose, like low or high blood glucose. Feeling judged by your health care providers. Feeling very alone with the disease. Depression Having diabetes means that you are at a higher risk for depression. Your health care provider may test (screen) you for symptoms of depression. It is important to recognize symptoms and to start treatment for depression soon after it is diagnosed. The following are some symptoms of depression: Loss of interest in things that you used to enjoy. Feeling depressed much or most of the time. A change in appetite. Trouble getting to sleep or staying asleep. Feeling tired most of the day. Feeling nervous and anxious. Feeling guilty and worrying that you are a burden to others. Having thoughts of hurting yourself or feeling that you want to die. If you have any of these symptoms, more days than not, for 2 weeks or longer, you may have depression. This would be a good time to contact your health care provider. Follow these instructions at home: Managing diabetes distress The following are some ways to manage emotional distress: Learn as much as you can about diabetes and its treatment. Take one step at a time to improve your management. Meet with a certified diabetes care and education specialist. Take a class to learn how to manage your condition. Consider working with a counselor or therapist. Keep a journal of your thoughts and concerns. Accept that some things are out of your control. Talk with other people who have diabetes. It can help to talk about the distress that you feel. Find ways to manage stress that work for you. These may include art or music therapy, exercise, meditation, and hobbies. Seek support from spiritual leaders, family, and friends.  General instructions Do your best to follow your diabetes management plan. If you are struggling to follow your plan, talk with a certified diabetes care and education specialist, or  with someone else who has diabetes. They may have ideas that will help. Forgive yourself for not being perfect. Almost everyone struggles with the tasks of diabetes. Keep all follow-up visits. This is important. Where to find support Search for information and support from the American Diabetes Association: www.diabetes.org Find a certified diabetes education and care specialist. Make an appointment through the Association of Diabetes Care & Education Specialists: www.diabeteseducator.org Contact a health care provider if: You believe your diabetes is getting out of control. You are concerned you may be depressed. You think your medications are not helping control your diabetes. You are feeling overwhelmed with your diabetes. Get help right away if: You have thoughts about hurting yourself or others. If you ever feel like you may hurt yourself or others, or have thoughts about taking your own life, get help right away. You can go to your nearest emergency department or call: Your local emergency services (911 in the [place] A suicide crisis helpline, such as the [facility] at (440) 730-2256 or 988 in the [place]. This is open 24 hours a day. Summary Diabetes (type 1 diabetes mellitus or type 2 diabetes mellitus) is a condition in which the body does not have enough of a hormone called insulin, or the body does not respond properly to insulin. Living with diabetes puts you at risk for medical and emotional issues, such as diabetes distress, depression, and anxiety. Recognizing the symptoms of diabetes distress and depression may help you avoid problems with your diabetes control. If  you experience symptoms, it is important to discuss this with your health care provider, certified diabetes care and education specialist, or therapist. It is important to start treatment for diabetes distress and depression soon after diagnosis. Ask your health care provider to recommend a  therapist who understands both depression and diabetes. This information is not intended to replace advice given to you by your health care provider. Make sure you discuss any questions you have with your health care provider. Document Revised: 08/05/2020 Document Reviewed: 05/23/2019 Elsevier Patient Education  [place].

## 2021-03-16 ENCOUNTER — Other Ambulatory Visit: Payer: Self-pay | Admitting: Internal Medicine

## 2021-03-16 NOTE — Telephone Encounter (Signed)
Please refill as per office routine med refill policy (all routine meds to be refilled for 3 mo or monthly (per pt preference) up to one year from last visit, then month to month grace period for 3 mo, then further med refills will have to be denied) ? ?

## 2021-03-22 ENCOUNTER — Other Ambulatory Visit: Payer: Self-pay

## 2021-03-22 ENCOUNTER — Ambulatory Visit
Admission: RE | Admit: 2021-03-22 | Discharge: 2021-03-22 | Disposition: A | Discharge status: Home / Self Care | Payer: HMO | Source: Ambulatory Visit | Attending: Internal Medicine | Admitting: Internal Medicine

## 2021-03-22 DIAGNOSIS — Z1231 Encounter for screening mammogram for malignant neoplasm of breast: Secondary | ICD-10-CM

## 2021-03-23 DIAGNOSIS — E7800 Pure hypercholesterolemia, unspecified: Secondary | ICD-10-CM | POA: Diagnosis not present

## 2021-03-23 DIAGNOSIS — E1165 Type 2 diabetes mellitus with hyperglycemia: Secondary | ICD-10-CM | POA: Diagnosis not present

## 2021-04-02 ENCOUNTER — Other Ambulatory Visit: Payer: Self-pay

## 2021-04-02 ENCOUNTER — Telehealth: Payer: Self-pay

## 2021-04-02 ENCOUNTER — Ambulatory Visit: Payer: HMO

## 2021-04-02 NOTE — Telephone Encounter (Signed)
Called patient , automatically goes to voice mail , left vm to call to reschedule MWV appointment. ? ? ?L.Sabree Nuon,LPN  ?

## 2021-04-05 ENCOUNTER — Ambulatory Visit (INDEPENDENT_AMBULATORY_CARE_PROVIDER_SITE_OTHER): Payer: HMO

## 2021-04-05 DIAGNOSIS — I495 Sick sinus syndrome: Secondary | ICD-10-CM

## 2021-04-05 LAB — CUP PACEART REMOTE DEVICE CHECK
Battery Remaining Longevity: 38 mo
Battery Voltage: 2.96 V
Brady Statistic AP VP Percent: 18.37 %
Brady Statistic AP VS Percent: 0 %
Brady Statistic AS VP Percent: 81.51 %
Brady Statistic AS VS Percent: 0.12 %
Brady Statistic RA Percent Paced: 18.32 %
Brady Statistic RV Percent Paced: 99.67 %
Date Time Interrogation Session: 20230313093937
Implantable Lead Implant Date: 20160203
Implantable Lead Implant Date: 20160203
Implantable Lead Location: 753859
Implantable Lead Location: 753860
Implantable Lead Model: 5076
Implantable Lead Model: 5076
Implantable Pulse Generator Implant Date: 20160203
Lead Channel Impedance Value: 323 Ohm
Lead Channel Impedance Value: 418 Ohm
Lead Channel Impedance Value: 437 Ohm
Lead Channel Impedance Value: 437 Ohm
Lead Channel Pacing Threshold Amplitude: 0.625 V
Lead Channel Pacing Threshold Amplitude: 0.75 V
Lead Channel Pacing Threshold Pulse Width: 0.4 ms
Lead Channel Pacing Threshold Pulse Width: 0.4 ms
Lead Channel Sensing Intrinsic Amplitude: 2.5 mV
Lead Channel Sensing Intrinsic Amplitude: 2.5 mV
Lead Channel Sensing Intrinsic Amplitude: 7.5 mV
Lead Channel Sensing Intrinsic Amplitude: 7.5 mV
Lead Channel Setting Pacing Amplitude: 2 V
Lead Channel Setting Pacing Amplitude: 2.5 V
Lead Channel Setting Pacing Pulse Width: 0.4 ms
Lead Channel Setting Sensing Sensitivity: 2 mV

## 2021-04-16 NOTE — Progress Notes (Signed)
Remote pacemaker transmission.   

## 2021-04-18 ENCOUNTER — Other Ambulatory Visit: Payer: Self-pay | Admitting: Internal Medicine

## 2021-05-11 ENCOUNTER — Encounter: Payer: Self-pay | Admitting: Internal Medicine

## 2021-05-11 ENCOUNTER — Ambulatory Visit (INDEPENDENT_AMBULATORY_CARE_PROVIDER_SITE_OTHER): Payer: HMO | Admitting: Internal Medicine

## 2021-05-11 VITALS — BP 136/72 | HR 68 | Temp 98.1°F | Ht 69.0 in | Wt 198.0 lb

## 2021-05-11 DIAGNOSIS — E559 Vitamin D deficiency, unspecified: Secondary | ICD-10-CM

## 2021-05-11 DIAGNOSIS — E7800 Pure hypercholesterolemia, unspecified: Secondary | ICD-10-CM | POA: Diagnosis not present

## 2021-05-11 DIAGNOSIS — E538 Deficiency of other specified B group vitamins: Secondary | ICD-10-CM

## 2021-05-11 DIAGNOSIS — Z0000 Encounter for general adult medical examination without abnormal findings: Secondary | ICD-10-CM | POA: Diagnosis not present

## 2021-05-11 DIAGNOSIS — E1165 Type 2 diabetes mellitus with hyperglycemia: Secondary | ICD-10-CM

## 2021-05-11 LAB — URINALYSIS, ROUTINE W REFLEX MICROSCOPIC
Bilirubin Urine: NEGATIVE
Hgb urine dipstick: NEGATIVE
Ketones, ur: NEGATIVE
Leukocytes,Ua: NEGATIVE
Nitrite: NEGATIVE
Specific Gravity, Urine: 1.015 (ref 1.000–1.030)
Total Protein, Urine: NEGATIVE
Urine Glucose: NEGATIVE
Urobilinogen, UA: 0.2 (ref 0.0–1.0)
pH: 6 (ref 5.0–8.0)

## 2021-05-11 LAB — MICROALBUMIN / CREATININE URINE RATIO
Creatinine,U: 62.3 mg/dL
Microalb Creat Ratio: 1.8 mg/g (ref 0.0–30.0)
Microalb, Ur: 1.1 mg/dL (ref 0.0–1.9)

## 2021-05-11 LAB — LIPID PANEL
Cholesterol: 159 mg/dL (ref 0–200)
HDL: 55.8 mg/dL
LDL Cholesterol: 72 mg/dL (ref 0–99)
NonHDL: 103.59
Total CHOL/HDL Ratio: 3
Triglycerides: 156 mg/dL — ABNORMAL HIGH (ref 0.0–149.0)
VLDL: 31.2 mg/dL (ref 0.0–40.0)

## 2021-05-11 LAB — CBC WITH DIFFERENTIAL/PLATELET
Basophils Absolute: 0 10*3/uL (ref 0.0–0.1)
Basophils Relative: 0.8 % (ref 0.0–3.0)
Eosinophils Absolute: 0.2 10*3/uL (ref 0.0–0.7)
Eosinophils Relative: 3.5 % (ref 0.0–5.0)
HCT: 36.7 % (ref 36.0–46.0)
Hemoglobin: 11.9 g/dL — ABNORMAL LOW (ref 12.0–15.0)
Lymphocytes Relative: 28.2 % (ref 12.0–46.0)
Lymphs Abs: 1.7 10*3/uL (ref 0.7–4.0)
MCHC: 32.3 g/dL (ref 30.0–36.0)
MCV: 77.7 fl — ABNORMAL LOW (ref 78.0–100.0)
Monocytes Absolute: 0.4 10*3/uL (ref 0.1–1.0)
Monocytes Relative: 7.2 % (ref 3.0–12.0)
Neutro Abs: 3.7 10*3/uL (ref 1.4–7.7)
Neutrophils Relative %: 60.3 % (ref 43.0–77.0)
Platelets: 215 10*3/uL (ref 150.0–400.0)
RBC: 4.73 Mil/uL (ref 3.87–5.11)
RDW: 17.2 % — ABNORMAL HIGH (ref 11.5–15.5)
WBC: 6.2 10*3/uL (ref 4.0–10.5)

## 2021-05-11 LAB — HEPATIC FUNCTION PANEL
ALT: 15 U/L (ref 0–35)
AST: 19 U/L (ref 0–37)
Albumin: 4.5 g/dL (ref 3.5–5.2)
Alkaline Phosphatase: 72 U/L (ref 39–117)
Bilirubin, Direct: 0.1 mg/dL (ref 0.0–0.3)
Total Bilirubin: 0.4 mg/dL (ref 0.2–1.2)
Total Protein: 7.7 g/dL (ref 6.0–8.3)

## 2021-05-11 LAB — TSH: TSH: 0.37 u[IU]/mL (ref 0.35–5.50)

## 2021-05-11 LAB — HEMOGLOBIN A1C: Hgb A1c MFr Bld: 6.5 % (ref 4.6–6.5)

## 2021-05-11 LAB — BASIC METABOLIC PANEL WITH GFR
BUN: 27 mg/dL — ABNORMAL HIGH (ref 6–23)
CO2: 26 meq/L (ref 19–32)
Calcium: 9.9 mg/dL (ref 8.4–10.5)
Chloride: 105 meq/L (ref 96–112)
Creatinine, Ser: 1.26 mg/dL — ABNORMAL HIGH (ref 0.40–1.20)
GFR: 43.48 mL/min — ABNORMAL LOW
Glucose, Bld: 80 mg/dL (ref 70–99)
Potassium: 4.8 meq/L (ref 3.5–5.1)
Sodium: 140 meq/L (ref 135–145)

## 2021-05-11 LAB — VITAMIN D 25 HYDROXY (VIT D DEFICIENCY, FRACTURES): VITD: 34.29 ng/mL (ref 30.00–100.00)

## 2021-05-11 LAB — VITAMIN B12: Vitamin B-12: 564 pg/mL (ref 211–911)

## 2021-05-11 NOTE — Progress Notes (Signed)
Patient ID: Stacey [name], female   DOB: 02/15/1950, 71 y.o.   MRN: [id number]         Chief Complaint:: wellness exam and Follow-up  Low vit d, hld, dm       HPI:  Stacey [name] is a 71 y.o. 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  Pt denies chest pain, increased sob or doe, wheezing, orthopnea, PND, increased LE swelling, palpitations, dizziness or syncope.   Pt denies polydipsia, polyuria, or new focal neuro s/s.    Pt denies fever, wt loss, night sweats, loss of appetite, or other constitutional symptoms  Not taking Vit d.  Has had several sugars in the 80's in the past wk.     Wt Readings from Last 3 Encounters:  05/11/21 198 lb (89.8 kg)  11/04/20 206 lb (93.4 kg)  05/05/20 204 lb 12.8 oz (92.9 kg)   BP Readings from Last 3 Encounters:  05/11/21 136/72  11/04/20 132/66  05/05/20 118/70   Immunization History  Administered Date(s) Administered   Fluad Quad(high Dose 65+) 11/04/2020   Influenza Whole 11/10/2008, 10/13/2009   Influenza, High Dose Seasonal PF 10/26/2016, 10/27/2017   Influenza,inj,Quad PF,6+ Mos 10/23/2013   Influenza-Unspecified 12/04/2014, 11/05/2019   PFIZER(Purple Top)SARS-COV-2 Vaccination 04/29/2019, 05/20/2019, 11/19/2019, 05/22/2020   Pfizer Covid-19 Vaccine Bivalent Booster 45yr & up 10/16/2020   Pneumococcal Conjugate-13 01/10/2017   Pneumococcal Polysaccharide-23 09/29/2005, 08/30/2010, 01/28/2014, 05/05/2020   Td 01/24/1993, 12/10/2008   Tdap 04/14/2020   There are no preventive care reminders to display for this patient.     Past Medical History:  Diagnosis Date   ANGIOEDEMA 03/07/2008   a. with ACE-I   ANXIETY 09/16/2006   ASTHMA 08/01/2006   CAD (coronary artery disease)    a. 01/2010 : Minimal plaque at cardiac catheterization - CFX 20%, EF 70%;  b. 08/2012 Cath: Nl Cors, EF 65%.    Chronic diastolic CHF (congestive heart failure) ([place] 04/06/2010   a. In setting of HOCM.   Complete heart block ([facility] 02/26/2014   a. 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 [facility] [facility] [place] [place] [place]  b. echo 5/12: EF 60-65%, LVOT peak 18 mmHg; grade 1 diast dysfxn, mild SAM (improved since myomectomy;  c. 03/2012 Echo: EF 60-65%, mod-sev basal septal asymm hypertrophy, basal septal HK, LVOT grad 637mg, Gr 1 DD, , SAM, Mild MR, nl RV, PASP 3541m; 08/2012 Echo: technically difficult, doubt LVOT obstruction, EF 60%, Gr 1 DD, mod-sev dil LA.   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[place]  SICKLE CELL ANEMIA 08/01/2006   Type II or unspecified type diabetes mellitus without mention of complication, uncontrolled 08/30/2010   VENTRAL HERNIA 12/07/2006   Past Surgical History:  Procedure Laterality Date   ABDOMINAL HYSTERECTOMY  1987   CARDIAC SURGERY     COLONOSCOPY N/A 12/26/2016   Procedure:  COLONOSCOPY;  Surgeon: Milus Banister, MD;  Location: [facility];  Service: Endoscopy;  Laterality: N/A;   ELECTROPHYSIOLOGY STUDY N/A 09/12/2012   Procedure: ELECTROPHYSIOLOGY STUDY;  Surgeon: Deboraha Sprang, MD;  Location: [facility] C[facility] LAB;  Service: Cardiovascular;  Laterality: N/A;   LEFT HEART CATH  Aug. 18, 2014   Medtronic heart device   LEFT HEART CATHETERIZATION WITH CORONARY ANGIOGRAM N/A 09/10/2012   Procedure: LEFT HEART CATHETERIZATION WITH CORONARY ANGIOGRAM;  Surgeon: Burnell Blanks, MD;  Location[facility] CATH LAB;  Service: Cardiovascular;  Laterality: N/A;   MYOMECTOMY     Septal    PERMANENT PACEMAKER INSERTION N/A 02/26/2014   MDT Advisa dual chamber pacemaker implanted by Dr Caryl Comes for heart block   VENTRAL HERNIA REPAIR  06/2006    reports that she quit smoking about 23 years ago. Her smoking use included cigarettes. She has a 5.00 pack-year smoking history. She has never used smokeless tobacco. She reports that she does not drink alcohol and does not use drugs. family history includes Clotting disorder in her daughter and another family member; Colon cancer in her daughter; Diabetes in her paternal grandmother; Heart attack in her father and sister; Heart disease in her daughter and father; Hyperlipidemia in her brother, daughter, and sister; Hypertension in her brother, father, sister, and son; Stroke in her sister. She was adopted. Allergies  Allergen Reactions   Dairy Aid [Tilactase] Diarrhea and Other (See Comments)    Bloating and gastric distress   Metformin And Related Other (See Comments)    GI upset   Ace Inhibitors Other (See Comments)    REACTION: angioedema right eye   Aspirin Other (See Comments)    Bleeding    Codeine Other (See Comments)    Hallucinations, can take if with someone.   Soy Allergy Other (See Comments)    Bleeding & cramps, diarrhea    Current Outpatient Medications on File Prior to Visit  Medication Sig Dispense Refill   acetaminophen (TYLENOL) 500 MG tablet Take 500 mg by mouth daily as needed.     albuterol (VENTOLIN HFA) 108 (90 Base) MCG/ACT inhaler Inhale 2 puffs into the lungs every 6 (six) hours as needed for wheezing or shortness of breath. 18 g 5   amLODipine (NORVASC) 10 MG tablet TAKE 1 TABLET(10 MG) BY MOUTH DAILY 90 tablet 1   atorvastatin (LIPITOR) 80 MG tablet Take 1 tablet (80 mg total) by mouth daily. 90 tablet 3   benzonatate (TESSALON PERLES) 100 MG capsule 1-2 capsules up to twice daily as needed 40 capsule 0   Blood Glucose Monitoring Suppl (ONE TOUCH ULTRA 2) W/DEVICE KIT Use to check blood sugars daily Dx E11.9  1 each 0   cetirizine (ZYRTEC) 10 MG tablet Take 10 mg by mouth daily.     clonazePAM (KLONOPIN) 0.5 MG tablet TAKE 1 TABLET BY MOUTH TWICE DAILY AS NEEDED FOR ANXIETY 30 tablet 2   dicyclomine (BENTYL) 20 MG tablet Take 1 tablet (20 mg total) by mouth 3 (three) times daily between meals as needed for spasms. 60 tablet 3   Dulaglutide (TRULICITY) 1.5 0000000 SOPN Inject 1.5 mg into the skin once a week. 6 mL 3   fenofibrate (TRICOR) 145 MG tablet TAKE 1 TABLET BY MOUTH DAILY 90 tablet 3   glipiZIDE (GLUCOTROL XL) 5 MG 24 hr tablet Take 1 tablet (5 mg total) by mouth daily with breakfast. 90 tablet 3   glucose blood test strip 1 each by Other route daily. Use  to check blood sugar daily Dx e11.9 100 each 11   hydrochlorothiazide (MICROZIDE) 12.5 MG capsule Take 1 capsule (12.5 mg total) by mouth daily. 90 capsule 1   hydrocortisone (ANUSOL-HC) 2.5 % rectal cream Place 1 application rectally as needed for hemorrhoids or anal itching.     losartan (COZAAR) 100 MG tablet TAKE 1 TABLET BY MOUTH DAILY 90 tablet 1   metoprolol succinate (TOPROL-XL) 100 MG 24 hr tablet TAKE 1 TABLET(100 MG) BY MOUTH DAILY WITH OR IMMEDIATELY FOLLOWING A MEAL 90 tablet 3   ondansetron (ZOFRAN ODT) 4 MG disintegrating tablet Take 1 tablet (4 mg total) by mouth every 8 (eight) hours as needed for nausea. 10 tablet 0   ONETOUCH DELICA LANCETS 99991111 MISC USE TO HELP CHECK BLOOD SUGAR DAILY 200 each 0   oxcarbazepine (TRILEPTAL) 600 MG tablet TAKE 1 TABLET(600 MG) BY MOUTH TWICE DAILY 180 tablet 1   pantoprazole (PROTONIX) 40 MG tablet TAKE 1 TABLET BY MOUTH EVERY DAY 90 tablet 0   pioglitazone (ACTOS) 45 MG tablet TAKE 1 TABLET BY MOUTH DAILY 90 tablet 1   potassium chloride (KLOR-CON M) 10 MEQ tablet TAKE 2 TABLETS BY MOUTH DAILY 180 tablet 1   prochlorperazine (COMPAZINE) 10 MG tablet Take 1 tablet (10 mg total) by mouth every 6 (six) hours as needed for nausea or vomiting. 30 tablet 1   topiramate (TOPAMAX) 100 MG tablet  TAKE 1 TABLET(100 MG) BY MOUTH DAILY 90 tablet 2   No current facility-administered medications on file prior to visit.        ROS:  All others reviewed and negative.  Objective        PE:  BP 136/72 (BP Location: Right Arm, Patient Position: Sitting, Cuff Size: Large)   Pulse 68   Temp 98.1 F (36.7 C) (Oral)   Ht '5\' 9"'$  (1.753 m)   Wt 198 lb (89.8 kg)   SpO2 98%   BMI 29.24 kg/m                 Constitutional: Pt appears in NAD               HENT: Head: NCAT.                Right Ear: External ear normal.                 Left Ear: External ear normal.                Eyes: . Pupils are equal, round, and reactive to light. Conjunctivae and EOM are normal               Nose: without d/c or deformity               Neck: Neck supple. Gross normal ROM               Cardiovascular: Normal rate and regular rhythm.                 Pulmonary/Chest: Effort normal and breath sounds without rales or wheezing.                Abd:  Soft, NT, ND, + BS, no organomegaly               Neurological: Pt is alert. At baseline orientation, motor grossly intact               Skin: Skin is warm. No rashes, no other new  lesions, LE edema - none               Psychiatric: Pt behavior is normal without agitation   Micro: none  Cardiac tracings I have personally interpreted today:  none  Pertinent Radiological findings (summarize): none   Lab Results  Component Value Date   WBC 6.2 05/11/2021   HGB 11.9 (L) 05/11/2021   HCT 36.7 05/11/2021   PLT 215.0 05/11/2021   GLUCOSE 80 05/11/2021   CHOL 159 05/11/2021   TRIG 156.0 (H) 05/11/2021   HDL 55.80 05/11/2021   LDLDIRECT 98.0 08/10/2018   LDLCALC 72 05/11/2021   ALT 15 05/11/2021   AST 19 05/11/2021   NA 140 05/11/2021   K 4.8 05/11/2021   CL 105 05/11/2021   CREATININE 1.26 (H) 05/11/2021   BUN 27 (H) 05/11/2021   CO2 26 05/11/2021   TSH 0.37 05/11/2021   INR 1.35 12/23/2016   HGBA1C 6.5 05/11/2021   MICROALBUR 1.1 05/11/2021    Assessment/Plan:  Stacey [name] is a 71 y.o. Black or African American [2] female with  has a past medical history of ANGIOEDEMA (03/07/2008), ANXIETY (09/16/2006), ASTHMA (08/01/2006), CAD (coronary artery disease), Chronic diastolic CHF (congestive heart failure) ([place]) (04/06/2010), Complete heart block ([place]) (02/26/2014), COPD (chronic obstructive pulmonary disease) ([place]), DEPRESSION (09/16/2006), DIVERTICULOSIS, COLON, WITH HEMORRHAGE (04/06/2010), DM (diabetes mellitus) in pregnancy, delivered w/postpartum condition (08/30/2010), HYPERLIPIDEMIA (08/01/2006), HYPERTENSION (08/01/2006), Hypertr obst cardiomyop (08/01/2006), Impaired glucose tolerance (06/25/2010), LBBB (left bundle branch block) (06/17/2010), OBESITY (09/16/2006), Renal artery aneurysm ([place]), SICKLE CELL ANEMIA (08/01/2006), Type II or unspecified type diabetes mellitus without mention of complication, uncontrolled (08/30/2010), and VENTRAL HERNIA (12/07/2006).  Diabetes [facility]) Lab Results  Component Value Date   HGBA1C 6.2 11/04/2020   overcontrolled pt to continue current medical treatment - trulicity, but consider d/c glucotrol for further low sugars  Vitamin D deficiency Last vitamin D Lab Results  Component Value Date   VD25OH 26.58 (L) 11/04/2020   Low, to startt oral replacement   Preventative health care Age and sex appropriate education and counseling updated with regular exercise and diet Referrals for preventative services - declines eye referral for now Immunizations addressed - declines shingrix Smoking counseling  - none needed Evidence for depression or other mood disorder - none significant Most recent labs reviewed. I have personally reviewed and have noted: 1) the patient's medical and social history 2) The patient's current medications and supplements 3) The patient's height, weight, and BMI have been recorded in the chart   Hyperlipidemia Lab Results  Component Value Date   [place] 72 05/11/2021    Stable, pt to continue current statin lipitor  Followup: Return in about 6 months (around 11/10/2021).  Cathlean Cower, MD 05/15/2021 9:02 PM [facility]

## 2021-05-11 NOTE — Assessment & Plan Note (Addendum)
Lab Results  ?Component Value Date  ? HGBA1C 6.2 11/04/2020  ? ?overcontrolled pt to continue current medical treatment - trulicity, but consider d/c glucotrol for further low sugars ?

## 2021-05-11 NOTE — Assessment & Plan Note (Signed)
Last vitamin D ?Lab Results  ?Component Value Date  ? VD25OH 26.58 (L) 11/04/2020  ? ?Low, to startt oral replacement ? ?

## 2021-05-11 NOTE — Patient Instructions (Signed)
Since your sugars overall are doing so well, dont be afraid to hold on taking the glipizide if you feel you are not eating as well or your sugars are running low ? ?Please take OTC Vitamin D3 at 2000 units per day, indefinitely, as you are ? ?Please continue all other medications as before, and refills have been done if requested. ? ?Please have the pharmacy call with any other refills you may need. ? ?Please continue your efforts at being more active, low cholesterol diet, and weight control. ? ?You are otherwise up to date with prevention measures today. ? ?Please keep your appointments with your specialists as you may have planned ? ?Please go to the LAB at the blood drawing area for the tests to be done ? ?You will be contacted by phone if any changes need to be made immediately.  Otherwise, you will receive a letter about your results with an explanation, but please check with MyChart first. ? ?Please remember to sign up for MyChart if you have not done so, as this will be important to you in the future with finding out test results, communicating by private email, and scheduling acute appointments online when needed. ? ?Please make an Appointment to return in 6 months, or sooner if needed ?

## 2021-05-13 ENCOUNTER — Ambulatory Visit (INDEPENDENT_AMBULATORY_CARE_PROVIDER_SITE_OTHER): Payer: HMO | Admitting: *Deleted

## 2021-05-13 DIAGNOSIS — E7800 Pure hypercholesterolemia, unspecified: Secondary | ICD-10-CM

## 2021-05-13 DIAGNOSIS — I10 Essential (primary) hypertension: Secondary | ICD-10-CM

## 2021-05-13 DIAGNOSIS — E1165 Type 2 diabetes mellitus with hyperglycemia: Secondary | ICD-10-CM

## 2021-05-13 NOTE — Chronic Care Management (AMB) (Signed)
Chronic Care Management   CCM RN Visit Note  05/13/2021 Name: Stacey [name] MRN: [id number] DOB: 10/17/1950  Subjective: Stacey [name] is a 71 y.o. year old female who is a primary care patient of Biagio Borg, MD. The care management team was consulted for assistance with disease management and care coordination needs.    Engaged with patient by telephone for follow up visit in response to provider referral for case management and/or care coordination services.   Consent to Services:  The patient was given information about Chronic Care Management services, agreed to services, and gave verbal consent prior to initiation of services.  Please see initial visit note for detailed documentation.  Patient agreed to services and verbal consent obtained.   Assessment: Review of patient past medical history, allergies, medications, health status, including review of consultants reports, laboratory and other test data, was performed as part of comprehensive evaluation and provision of chronic care management services.   SDOH (Social Determinants of Health) assessments and interventions performed:  SDOH Interventions    Flowsheet Row Most Recent Value  SDOH Interventions   Food Insecurity Interventions Intervention Not Indicated  [continues to deny food insecurity]  Transportation Interventions Intervention Not Indicated  [patient continues to drive self]     CCM Care Plan  Allergies  Allergen Reactions   Dairy Aid [Tilactase] Diarrhea and Other (See Comments)    Bloating and gastric distress   Metformin And Related Other (See Comments)    GI upset   Ace Inhibitors Other (See Comments)    REACTION: angioedema right eye   Aspirin Other (See Comments)    Bleeding    Codeine Other (See Comments)    Hallucinations, can take if with someone.   Soy Allergy Other (See Comments)    Bleeding & cramps, diarrhea    Outpatient Encounter Medications as of 05/13/2021   Medication Sig   acetaminophen (TYLENOL) 500 MG tablet Take 500 mg by mouth daily as needed.   albuterol (VENTOLIN HFA) 108 (90 Base) MCG/ACT inhaler Inhale 2 puffs into the lungs every 6 (six) hours as needed for wheezing or shortness of breath.   amLODipine (NORVASC) 10 MG tablet TAKE 1 TABLET(10 MG) BY MOUTH DAILY   atorvastatin (LIPITOR) 80 MG tablet Take 1 tablet (80 mg total) by mouth daily.   benzonatate (TESSALON PERLES) 100 MG capsule 1-2 capsules up to twice daily as needed   Blood Glucose Monitoring Suppl (ONE TOUCH ULTRA 2) W/DEVICE KIT Use to check blood sugars daily Dx E11.9   cetirizine (ZYRTEC) 10 MG tablet Take 10 mg by mouth daily.   clonazePAM (KLONOPIN) 0.5 MG tablet TAKE 1 TABLET BY MOUTH TWICE DAILY AS NEEDED FOR ANXIETY   dicyclomine (BENTYL) 20 MG tablet Take 1 tablet (20 mg total) by mouth 3 (three) times daily between meals as needed for spasms.   Dulaglutide (TRULICITY) 1.5 0000000 SOPN Inject 1.5 mg into the skin once a week.   fenofibrate (TRICOR) 145 MG tablet TAKE 1 TABLET BY MOUTH DAILY   glipiZIDE (GLUCOTROL XL) 5 MG 24 hr tablet Take 1 tablet (5 mg total) by mouth daily with breakfast.   glucose blood test strip 1 each by Other route daily. Use to check blood sugar daily Dx e11.9   hydrochlorothiazide (MICROZIDE) 12.5 MG capsule Take 1 capsule (12.5 mg total) by mouth daily.   hydrocortisone (ANUSOL-HC) 2.5 % rectal cream Place 1 application rectally as needed for hemorrhoids or anal itching.   losartan (COZAAR) 100 MG  tablet TAKE 1 TABLET BY MOUTH DAILY   metoprolol succinate (TOPROL-XL) 100 MG 24 hr tablet TAKE 1 TABLET(100 MG) BY MOUTH DAILY WITH OR IMMEDIATELY FOLLOWING A MEAL   ondansetron (ZOFRAN ODT) 4 MG disintegrating tablet Take 1 tablet (4 mg total) by mouth every 8 (eight) hours as needed for nausea.   ONETOUCH DELICA LANCETS 99991111 MISC USE TO HELP CHECK BLOOD SUGAR DAILY   oxcarbazepine (TRILEPTAL) 600 MG tablet TAKE 1 TABLET(600 MG) BY MOUTH  TWICE DAILY   pantoprazole (PROTONIX) 40 MG tablet TAKE 1 TABLET BY MOUTH EVERY DAY   pioglitazone (ACTOS) 45 MG tablet TAKE 1 TABLET BY MOUTH DAILY   potassium chloride (KLOR-CON M) 10 MEQ tablet TAKE 2 TABLETS BY MOUTH DAILY   prochlorperazine (COMPAZINE) 10 MG tablet Take 1 tablet (10 mg total) by mouth every 6 (six) hours as needed for nausea or vomiting.   topiramate (TOPAMAX) 100 MG tablet TAKE 1 TABLET(100 MG) BY MOUTH DAILY   No facility-administered encounter medications on file as of 05/13/2021.   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   Hematochezia    Acute blood loss anemia    Fibrinogen decreased ([facility])    Lower GI bleed 12/21/2016   Acute gastroenteritis 12/31/2015   N&V (nausea and vomiting) 09/29/2015   Diarrhea 09/29/2015   Abdominal mass, RLQ (right lower quadrant) 09/04/2015   Preventative health care 09/04/2015   Wheezing 11/14/2014   S/P myomectomy 04/08/2014   Complete heart block ([place]) 02/26/2014   LINQ recorder  02/26/2014   Pacemaker-Medtronic  MRI compatible 02/26/2014   Nocturnal hypoxemia 01/30/2014   Pancreatitis 10/22/2013   Odynophagia 10/22/2013   Chest pain syndrome 10/21/2013   Syncope 12/18/2012   Loop recorder-LINQ 12/18/2012   Palpitations 09/12/2012   Midsternal chest pain 09/12/2012   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   Abdominal pain 07/24/2012   Polyarthralgia 07/24/2012   CAD (coronary artery disease) 05/04/2012   COPD (chronic obstructive pulmonary disease) ([place]) 05/04/2012   Angioedema 03/04/2012   Varicose veins of lower extremities with other complications [id number]   Abnormal liver function test 09/07/2011    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   Trigeminal neuralgia 08/15/2011   Vertigo 07/27/2011   Diabetes ([place]) 08/30/2010   Encounter for long-term (current) use of high-risk medication 08/30/2010   Status post myomectomy 06/17/2010   LBBB (left bundle branch block) [id number]   Diastolic heart failure ([place]) 04/06/2010   Diverticulosis of large intestine 04/06/2010   Diverticulosis 04/06/2010   RECTAL BLEEDING 04/06/2010   BRADYCARDIA 10/13/2009   ALLERGIC RHINITIS 12/07/2006   VENTRAL HERNIA 12/07/2006   COLONIC POLYPS, HX OF 12/07/2006   OBESITY 09/16/2006   ANXIETY 09/16/2006   DEPRESSION 09/16/2006   Hyperlipidemia 08/01/2006   Sickle cell anemia ([place]) 08/01/2006   Essential hypertension 08/01/2006   Hypertrophic obstructive cardiomyopathy ([place]) 08/01/2006   Asthma 08/01/2006   Conditions to be addressed/monitored:  HTN, HLD, and DMII  Care Plan : RN Care Manager Plan of Care  Updates made by Knox Royalty, RN since 05/13/2021 12:00 AM     Problem: Chronic Disease Management Needs   Priority: High     Long-Range Goal: Ongoing adherence to established  Plan of Care for long term chronic disease management   Start Date: 12/08/2020  Expected End Date: 12/08/2021  Priority: High  Note:   Current Barriers:  Chronic Disease Management support and education needs related to HLD and DMII  RNCM Clinical Goal(s):  Patient will demonstrate Ongoing health management independence DMII; HLD  through collaboration with RN Care manager, provider, and care team.   Interventions: 1:1 collaboration with primary care provider regarding development and update of comprehensive plan of care as evidenced by provider attestation and co-signature Inter-disciplinary care team collaboration (see longitudinal plan of care) Evaluation of current treatment plan related to  self management and patient's adherence to plan as established by provider Review of patient status, including review of  consultants reports, relevant laboratory and other test results, and medications completed SDOH updated: no new/ unmet concerns identified Pain assessment updated: denies pain today Medications discussed: reports continues to independently self-manage and denies current concerns/ issues/ questions around medications; endorses adherence to taking all medications as prescribed Reviewed upcoming scheduled provider appointments: none; reminded patient to schedule follow up appointment with Dr. [name] in mid-October Reviewed recent PCP office visit with patient 05/11/21 Discussed plans with patient for ongoing care management follow up and provided patient with direct contact information for care management team     Hyperlipidemia Interventions: GOAL STATUS: 05/13/21- Goal on track/ progressing; Long Term goal Reviewed role and benefits of statin for ASCVD risk reduction Reviewed importance of limiting foods high in cholesterol Reviewed exercise goals and target of 150 minutes per week Confirmed patient does not routinely monitor blood pressures at home-- however, she follows her blood pressures during provider office visits and occasionally when out in community; verbalizes that she has a tendency toward white coat syndrome: she does not monitor at home routinely, as this can produce anxiety: we reviewed her recent blood pressures and no concerns were identified  Diabetes Interventions: GOAL STATUS: 05/13/21- Goal on track/ progressing; Long Term goal Provided education to patient about basic DM disease process Reviewed recent fasting blood sugars: reports consistent values at home between 84-120; reports general ranges between 80-90; reports today's fasting at "84" Reinforced previously provided education around signs/ symptoms and corresponding action plan for hypoglycemia: she denies recent low values at home/ signs/ symptoms- again reports "one" isolated low "in 70's" "several months ago: this does not  happen regularly and she did not experience signs/ symptoms hypoglycemia Confirmed that she discussed use of glipizide with PCP during office visit 05/11/21- she reports Dr. [name] advised not to take if fasting blood sugars are < 120- she has not needed to take post-recent office visit with PCP Reinforced previously provided education around dietary strategies for self- management of DMII: patient continues to limit carbohydrates, sugar, and portions; also follows general heart healthy, low salt diet- positive reinforcement provided with encouragement to continue efforts Previously provided education around role of activity in setting of HLD/ HTN/ DMII reinforced- patient active at baseline and tells me today that when she is working, she often gets > 3000 steps in: positive reinforcement provided with encouragement to continue efforts Reminded patient to schedule/ complete routine health maintenance provider visits for vision/ annual eye exam and to routinely monitor status of her feet  Lab Results  Component Value Date   HGBA1C 6.5 05/11/2021  Patient Goals/Self-Care Activities: As evidenced by review of EHR, collaboration with care team, and patient reporting during CCM RN CM outreach,  Patient Alayja will: Take medications as prescribed Attend all scheduled provider  appointments Call pharmacy for medication refills Call provider office for new concerns or questions Continue to check fasting (first thing in the morning, before eating) blood sugars at home- the blood sugar values we reviewed today are right on track where they should be Continue to follow heart healthy, low salt, low cholesterol, carbohydrate-modified, low sugar diet Keep up the great work preventing falls! Continue to stay as active as you are able Continue efforts to schedule annual eye exam Make an appointment for a follow up visit with Dr. [name]- it is due in mid-October 2023  Follow Up Plan:   Telephone follow up  appointment with care management team member scheduled for:  Monday November 22, 2021, at 11:30 am The patient has been provided with contact information for the care management team and has been advised to call with any health related questions or concerns     Plan: The patient has been provided with contact information for the care management team and has been advised to call with any health related questions or concerns  [name], RN, BSN, [place] [phone]: direct office

## 2021-05-13 NOTE — Patient Instructions (Signed)
Visit [name], thank you for taking time to talk with me today. Please don't hesitate to contact me if I can be of assistance to you before our next scheduled telephone appointment.  Below are the goals we discussed today:  Patient Self-Care Activities: Patient Stacey [name] will: Take medications as prescribed Attend all scheduled provider appointments Call pharmacy for medication refills Call provider office for new concerns or questions Continue to check fasting (first thing in the morning, before eating) blood sugars at home- the blood sugar values we reviewed today are right on track where they should be Continue to follow heart healthy, low salt, low cholesterol, carbohydrate-modified, low sugar diet Keep up the great work preventing falls! Continue to stay as active as you are able Continue efforts to schedule annual eye exam Make an appointment for a follow up visit with Dr. [name]- it is due in mid-October 2023  [place] next scheduled telephone follow up visit/ appointment is scheduled on: Monday, November 22, 2021 at 11:30 am- This is a PHONE CALL appointment  If you need to cancel or re-schedule our visit, please call [phone] and our care guide team will be happy to assist you.   I look forward to hearing about your progress.   [name], RN, BSN, [place] [phone]: direct office  If you are experiencing a Mental Health or [place] or need someone to talk to, please  call the Suicide and Crisis Lifeline: 988 call the [place] National Suicide Prevention Lifeline: (602) 422-8075 or TTY: 725-875-1083 TTY 980-520-7570) to talk to a trained counselor call 1-800-273-TALK (toll free, 24 hour hotline) go to [facility] [address] [place] [phone]) call 911   Patient verbalizes understanding of instructions and care plan provided today  and agrees to view in [place]. Active MyChart status confirmed with patient  Diabetes Mellitus and Standards of [place] with and managing diabetes (diabetes mellitus) can be complicated. Your diabetes treatment may be managed by a team of health care providers, including: A physician who specializes in diabetes (endocrinologist). You might also have visits with a nurse practitioner or physician assistant. Nurses. A registered dietitian. A certified diabetes care and education specialist. An exercise specialist. A pharmacist. An eye doctor. A foot specialist (podiatrist). A dental care provider. A primary care provider. A mental health care provider. How to manage your diabetes You can do many things to successfully manage your diabetes. Your health care providers will follow guidelines to help you get the best quality of care. Here are general guidelines for your diabetes management plan. Your health care providers may give you more specific instructions. Physical exams When you are diagnosed with diabetes, and each year after that, your health care provider will ask about your medical and family history. You will have a physical exam, which may include: Measuring your height, weight, and body mass index (BMI). Checking your blood pressure. This will be done at every routine medical visit. Your target blood pressure may vary depending on your medical conditions, your age, and other factors. A thyroid exam. A skin exam. Screening for nerve damage (peripheral neuropathy). This may include checking the pulse in your legs and feet and the level of sensation in your hands and feet. A foot exam to inspect the structure and skin of your feet, including checking for cuts, bruises, redness, blisters, sores, or other problems. Screening for blood vessel (vascular) problems. This may  include checking the pulse in your legs and feet and checking your temperature. Blood tests Depending on  your treatment plan and your personal needs, you may have the following tests: Hemoglobin A1C (HbA1C). This test provides information about blood sugar (glucose) control over the previous 2-3 months. It is used to adjust your treatment plan, if needed. This test will be done: At least 2 times a year, if you are meeting your treatment goals. 4 times a year, if you are not meeting your treatment goals or if your goals have changed. Lipid testing, including total cholesterol, LDL and HDL cholesterol, and triglyceride levels. The goal for LDL is less than 100 mg/dL (5.5 mmol/L). If you are at high risk for complications, the goal is less than 70 mg/dL (3.9 mmol/L). The goal for HDL is 40 mg/dL (2.2 mmol/L) or higher for men, and 50 mg/dL (2.8 mmol/L) or higher for women. An HDL cholesterol of 60 mg/dL (3.3 mmol/L) or higher gives some protection against heart disease. The goal for triglycerides is less than 150 mg/dL (8.3 mmol/L). Liver function tests. Kidney function tests. Thyroid function tests.  Dental and eye exams  Visit your dentist two times a year. If you have type 1 diabetes, your health care provider may recommend an eye exam within 5 years after you are diagnosed, and then once a year after your first exam. For children with type 1 diabetes, the health care provider may recommend an eye exam when your child is age 42 or older and has had diabetes for 3-5 years. After the first exam, your child should get an eye exam once a year. If you have type 2 diabetes, your health care provider may recommend an eye exam as soon as you are diagnosed, and then every 1-2 years after your first exam. Immunizations A yearly flu (influenza) vaccine is recommended annually for everyone 6 months or older. This is especially important if you have diabetes. The pneumonia (pneumococcal) vaccine is recommended for everyone 2 years or older who has diabetes. If you are age 81 or older, you may get the pneumonia  vaccine as a series of two separate shots. The hepatitis B vaccine is recommended for adults shortly after being diagnosed with diabetes. Adults and children with diabetes should receive all other vaccines according to age-specific recommendations from the Centers for Disease Control and Prevention (CDC). Mental and emotional health Screening for symptoms of eating disorders, anxiety, and depression is recommended at the time of diagnosis and after as needed. If your screening shows that you have symptoms, you may need more evaluation. You may work with a mental health care provider. Follow these instructions at home: Treatment plan You will monitor your blood glucose levels and may give yourself insulin. Your treatment plan will be reviewed at every medical visit. You and your health care provider will discuss: How you are taking your medicines, including insulin. Any side effects you have. Your blood glucose level target goals. How often you monitor your blood glucose level. Lifestyle habits, such as activity level and tobacco, alcohol, and substance use. Education Your health care provider will assess how well you are monitoring your blood glucose levels and whether you are taking your insulin and medicines correctly. He or she may refer you to: A certified diabetes care and education specialist to manage your diabetes throughout your life, starting at diagnosis. A registered dietitian who can create and review your personal nutrition plan. An exercise specialist who can discuss your activity level and  exercise plan. General instructions Take over-the-counter and prescription medicines only as told by your health care provider. Keep all follow-up visits. This is important. Where to find support There are many diabetes support networks, including: American Diabetes Association (ADA): diabetes.org Defeat Diabetes Foundation: defeatdiabetes.org Where to find more information American  Diabetes Association (ADA): www.diabetes.org Association of Diabetes Care & Education Specialists (ADCES): diabeteseducator.org International Diabetes Federation (IDF): https://www.munoz-bell.org/ Summary Managing diabetes (diabetes mellitus) can be complicated. Your diabetes treatment may be managed by a team of health care providers. Your health care providers follow guidelines to help you get the best quality care. You should have physical exams, blood tests, blood pressure monitoring, immunizations, and screening tests regularly. Stay updated on how to manage your diabetes. Your health care providers may also give you more specific instructions based on your individual health. This information is not intended to replace advice given to you by your health care provider. Make sure you discuss any questions you have with your health care provider. Document Revised: 07/18/2019 Document Reviewed: 07/18/2019 Elsevier Patient Education  [place].

## 2021-05-15 ENCOUNTER — Encounter: Payer: Self-pay | Admitting: Internal Medicine

## 2021-05-15 NOTE — Assessment & Plan Note (Signed)
Age and sex appropriate education and counseling updated with regular exercise and diet ?Referrals for preventative services - declines eye referral for now ?Immunizations addressed - declines shingrix ?Smoking counseling  - none needed ?Evidence for depression or other mood disorder - none significant ?Most recent labs reviewed. ?I have personally reviewed and have noted: ?1) the patient's medical and social history ?2) The patient's current medications and supplements ?3) The patient's height, weight, and BMI have been recorded in the chart ? ?

## 2021-05-15 NOTE — Assessment & Plan Note (Signed)
Lab Results  ?Component Value Date  ? LDLCALC 72 05/11/2021  ? ?Stable, pt to continue current statin lipitor ? ?

## 2021-05-23 DIAGNOSIS — E1169 Type 2 diabetes mellitus with other specified complication: Secondary | ICD-10-CM | POA: Diagnosis not present

## 2021-05-23 DIAGNOSIS — Z7984 Long term (current) use of oral hypoglycemic drugs: Secondary | ICD-10-CM | POA: Diagnosis not present

## 2021-05-23 DIAGNOSIS — E785 Hyperlipidemia, unspecified: Secondary | ICD-10-CM

## 2021-06-28 ENCOUNTER — Encounter: Payer: Self-pay | Admitting: Student

## 2021-06-28 ENCOUNTER — Ambulatory Visit (INDEPENDENT_AMBULATORY_CARE_PROVIDER_SITE_OTHER): Payer: HMO | Admitting: Student

## 2021-06-28 VITALS — BP 110/62 | HR 70 | Ht 69.0 in | Wt 197.0 lb

## 2021-06-28 DIAGNOSIS — I447 Left bundle-branch block, unspecified: Secondary | ICD-10-CM

## 2021-06-28 DIAGNOSIS — I495 Sick sinus syndrome: Secondary | ICD-10-CM

## 2021-06-28 DIAGNOSIS — I442 Atrioventricular block, complete: Secondary | ICD-10-CM

## 2021-06-28 DIAGNOSIS — I10 Essential (primary) hypertension: Secondary | ICD-10-CM | POA: Diagnosis not present

## 2021-06-28 LAB — CUP PACEART INCLINIC DEVICE CHECK
Battery Remaining Longevity: 35 mo
Battery Voltage: 2.96 V
Brady Statistic AP VP Percent: 16.07 %
Brady Statistic AP VS Percent: 0 %
Brady Statistic AS VP Percent: 83.66 %
Brady Statistic AS VS Percent: 0.27 %
Brady Statistic RA Percent Paced: 16.04 %
Brady Statistic RV Percent Paced: 99.58 %
Date Time Interrogation Session: 20230605101947
Implantable Lead Implant Date: 20160203
Implantable Lead Implant Date: 20160203
Implantable Lead Location: 753859
Implantable Lead Location: 753860
Implantable Lead Model: 5076
Implantable Lead Model: 5076
Implantable Pulse Generator Implant Date: 20160203
Lead Channel Impedance Value: 361 Ohm
Lead Channel Impedance Value: 475 Ohm
Lead Channel Impedance Value: 494 Ohm
Lead Channel Impedance Value: 513 Ohm
Lead Channel Pacing Threshold Amplitude: 0.75 V
Lead Channel Pacing Threshold Amplitude: 0.875 V
Lead Channel Pacing Threshold Pulse Width: 0.4 ms
Lead Channel Pacing Threshold Pulse Width: 0.4 ms
Lead Channel Sensing Intrinsic Amplitude: 15.75 mV
Lead Channel Sensing Intrinsic Amplitude: 15.75 mV
Lead Channel Sensing Intrinsic Amplitude: 2.375 mV
Lead Channel Sensing Intrinsic Amplitude: 2.875 mV
Lead Channel Setting Pacing Amplitude: 2 V
Lead Channel Setting Pacing Amplitude: 2.5 V
Lead Channel Setting Pacing Pulse Width: 0.4 ms
Lead Channel Setting Sensing Sensitivity: 2 mV

## 2021-06-28 NOTE — Progress Notes (Signed)
Electrophysiology Office Note Date: 06/28/2021  ID:  THEONE BOWELL, DOB 1950/04/11, MRN [id number]  PCP: Biagio Borg, MD Primary Cardiologist: None Electrophysiologist: Dr. Caryl Comes  CC: Pacemaker follow-up  Stacey [name] is a 71 y.o. female seen today for Stacey Axe, MD for routine electrophysiology followup.  Since last being seen in our clinic the patient reports doing well overall from a cardiac perspective.  she denies chest pain, palpitations, dyspnea, PND, orthopnea, nausea, vomiting, dizziness, syncope, edema, weight gain, or early satiety.  Device History: Medtronic Dual Chamber PPM implanted 2016 for CHB  Past Medical History:  Diagnosis Date   ANGIOEDEMA 03/07/2008   a. with ACE-I   ANXIETY 09/16/2006   ASTHMA 08/01/2006   CAD (coronary artery disease)    a. 01/2010 : Minimal plaque at cardiac catheterization - CFX 20%, EF 70%;  b. 08/2012 Cath: Nl Cors, EF 65%.   Chronic diastolic CHF (congestive heart failure) ([place]) 04/06/2010   a. In setting of HOCM.   Complete heart block ([place]) 02/26/2014   a. 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 s/p septal myomectomy 4/12 at [facility] with Dr. Evelina Dun;  b. echo 5/12: EF 60-65%, LVOT peak 18 mmHg; grade 1 diast dysfxn, mild SAM (improved since myomectomy;  c. 03/2012 Echo: EF 60-65%, mod-sev basal septal asymm hypertrophy, basal septal HK, LVOT grad 60mHg, Gr 1 DD, , SAM, Mild MR, nl RV, PASP 357mg; 08/2012 Echo: technically difficult, doubt LVOT obstruction, EF 60%, Gr 1 DD, mod-sev dil LA.   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[place]    SICKLE CELL ANEMIA 08/01/2006   Type II or unspecified type diabetes mellitus without mention of complication, uncontrolled 08/30/2010   VENTRAL HERNIA 12/07/2006   Past Surgical History:  Procedure Laterality Date   ABDOMINAL HYSTERECTOMY  1987   CARDIAC SURGERY     COLONOSCOPY N/A 12/26/2016   Procedure: COLONOSCOPY;  Surgeon: JaMilus BanisterMD;  Location: [facility];  Service: Endoscopy;  Laterality: N/A;   ELECTROPHYSIOLOGY STUDY N/A 09/12/2012   Procedure: ELECTROPHYSIOLOGY STUDY;  Surgeon: StDeboraha SprangMD;  [facility];  Service: Cardiovascular;  Laterality: N/A;   LEFT HEART CATH  Aug. 18, 2014   Medtronic heart device   LEFT HEART CATHETERIZATION WITH CORONARY ANGIOGRAM N/A 09/10/2012   Procedure: LEFT HEART CATHETERIZATION WITH CORONARY ANGIOGRAM;  Surgeon: ChBurnell BlanksMD;  [facility];  Service: Cardiovascular;  Laterality: N/A;   MYOMECTOMY     Septal   PERMANENT PACEMAKER INSERTION N/A 02/26/2014   MDT Advisa dual chamber pacemaker implanted by Dr KlCaryl Comesor heart block   VENTRAL HERNIA REPAIR  06/2006    Current Outpatient Medications  Medication Sig Dispense Refill   acetaminophen (TYLENOL) 500 MG tablet Take 500 mg by mouth daily as needed.     albuterol (VENTOLIN HFA) 108 (90 Base) MCG/ACT inhaler Inhale 2 puffs into the lungs every 6 (six) hours as needed for wheezing or shortness of breath. 18 g 5   amLODipine (NORVASC) 10 MG tablet TAKE 1 TABLET(10 MG) BY MOUTH DAILY 90 tablet 1   atorvastatin (LIPITOR) 80 MG  tablet Take 1 tablet (80 mg total) by mouth daily. 90 tablet 3   benzonatate (TESSALON PERLES) 100 MG capsule 1-2 capsules up to twice daily as needed 40 capsule 0   Blood Glucose Monitoring Suppl (ONE TOUCH ULTRA 2) W/DEVICE KIT Use to check blood sugars daily Dx E11.9 1 each 0   cetirizine (ZYRTEC) 10 MG tablet Take 10 mg by mouth daily.     clonazePAM (KLONOPIN) 0.5 MG tablet TAKE 1 TABLET BY MOUTH TWICE DAILY AS NEEDED FOR  ANXIETY 30 tablet 2   dicyclomine (BENTYL) 20 MG tablet Take 1 tablet (20 mg total) by mouth 3 (three) times daily between meals as needed for spasms. 60 tablet 3   Dulaglutide (TRULICITY) 1.5 HY/8.5OY SOPN Inject 1.5 mg into the skin once a week. 6 mL 3   fenofibrate (TRICOR) 145 MG tablet TAKE 1 TABLET BY MOUTH DAILY 90 tablet 3   glipiZIDE (GLUCOTROL XL) 5 MG 24 hr tablet Take 1 tablet (5 mg total) by mouth daily with breakfast. 90 tablet 3   glucose blood test strip 1 each by Other route daily. Use to check blood sugar daily Dx e11.9 100 each 11   hydrochlorothiazide (MICROZIDE) 12.5 MG capsule Take 1 capsule (12.5 mg total) by mouth daily. 90 capsule 1   hydrocortisone (ANUSOL-HC) 2.5 % rectal cream Place 1 application rectally as needed for hemorrhoids or anal itching.     losartan (COZAAR) 100 MG tablet TAKE 1 TABLET BY MOUTH DAILY 90 tablet 1   metoprolol succinate (TOPROL-XL) 100 MG 24 hr tablet TAKE 1 TABLET(100 MG) BY MOUTH DAILY WITH OR IMMEDIATELY FOLLOWING A MEAL 90 tablet 3   ondansetron (ZOFRAN ODT) 4 MG disintegrating tablet Take 1 tablet (4 mg total) by mouth every 8 (eight) hours as needed for nausea. 10 tablet 0   ONETOUCH DELICA LANCETS 77A MISC USE TO HELP CHECK BLOOD SUGAR DAILY 200 each 0   oxcarbazepine (TRILEPTAL) 600 MG tablet TAKE 1 TABLET(600 MG) BY MOUTH TWICE DAILY 180 tablet 1   pantoprazole (PROTONIX) 40 MG tablet TAKE 1 TABLET BY MOUTH EVERY DAY 90 tablet 0   pioglitazone (ACTOS) 45 MG tablet TAKE 1 TABLET BY MOUTH DAILY 90 tablet 1   potassium chloride (KLOR-CON M) 10 MEQ tablet TAKE 2 TABLETS BY MOUTH DAILY 180 tablet 1   prochlorperazine (COMPAZINE) 10 MG tablet Take 1 tablet (10 mg total) by mouth every 6 (six) hours as needed for nausea or vomiting. 30 tablet 1   topiramate (TOPAMAX) 100 MG tablet TAKE 1 TABLET(100 MG) BY MOUTH DAILY 90 tablet 2   No current facility-administered medications for this visit.    Allergies:   Dairy aid [tilactase], Metformin  and related, Ace inhibitors, Aspirin, Codeine, and Soy allergy   Social History: Social History   Socioeconomic History   Marital status: Widowed    Spouse name: Not on file   Number of children: 3   Years of education: Not on file   Highest education level: Not on file  Occupational History   Occupation: retired Product manager: [employer]  Tobacco Use   Smoking status: Former    Packs/day: 0.25    Years: 20.00    Pack years: 5.00    Types: Cigarettes    Quit date: 01/24/1998    Years since quitting: 23.4   Smokeless tobacco: Never   Tobacco comments:    Quit smoking 2001. Smoked on and off for 20 years. Smoked 2-3 cigars daily  Vaping Use   Vaping Use: Never used  Substance and Sexual Activity   Alcohol use: No    Alcohol/week: 0.0 standard drinks   Drug use: No   Sexual activity: Not Currently  Other Topics Concern   Not on file  Social History Narrative   Lives in [place] with husband.  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   Social Determinants of Health   Financial Resource Strain: Not on file  Food Insecurity: No Food Insecurity   Worried About Charity fundraiser in the Last Year: Never true   Ran Out of Food in the Last Year: Never true  Transportation Needs: No Transportation Needs   Lack of Transportation (Medical): No   Lack of Transportation (Non-Medical): No  Physical Activity: Not on file  Stress: Not on file  Social Connections: Not on file  Intimate Partner Violence: Not on file    Family History: Family History  Adopted: Yes  Problem Relation Age of Onset   Heart disease Father    Heart attack Father    Hypertension Father        before age 19   Colon cancer Daughter        and bleeding disorder   Hyperlipidemia Daughter    Heart disease Daughter    Heart attack Sister    Hyperlipidemia Sister    Stroke Sister    Hypertension Sister    Hyperlipidemia Brother    Hypertension Brother    Diabetes  Paternal Grandmother    Clotting disorder Daughter    Clotting disorder Other        granddaughter   Hypertension Son      Review of Systems: All other systems reviewed and are otherwise negative except as noted above.  Physical Exam: Vitals:   06/28/21 0957  BP: 110/62  Pulse: 70  SpO2: 97%  Weight: 197 lb (89.4 kg)  Height: 5' 9" (1.753 m)     GEN- The patient is well appearing, alert and oriented x 3 today.   HEENT: normocephalic, atraumatic; sclera clear, conjunctiva pink; hearing intact; oropharynx clear; neck supple  Lungs- Clear to ausculation bilaterally, normal work of breathing.  No wheezes, rales, rhonchi Heart- Regular rate and rhythm, no murmurs, rubs or gallops  GI- soft, non-tender, non-distended, bowel sounds present  Extremities- no clubbing or cyanosis. No edema MS- no significant deformity or atrophy Skin- warm and dry, no rash or lesion; PPM pocket well healed Psych- euthymic mood, full affect Neuro- strength and sensation are intact  PPM Interrogation- reviewed in detail today,  See PACEART report  EKG:  EKG is ordered today. Personal review of ekg ordered today shows AS-Vp at 70   Recent Labs: 05/11/2021: ALT 15; BUN 27; Creatinine, Ser 1.26; Hemoglobin 11.9; Platelets 215.0; Potassium 4.8; Sodium 140; TSH 0.37   Wt Readings from Last 3 Encounters:  06/28/21 197 lb (89.4 kg)  05/11/21 198 lb (89.8 kg)  11/04/20 206 lb (93.4 kg)     Other studies Reviewed: Additional studies/ records that were reviewed today include: Previous EP office notes, Previous remote checks, Most recent labwork.   Assessment and Plan:  HCM  S/p myectomy   Left bundle branch block   Complete heart block s/p MDT PPM  The patients device was interrogated and the information was fully reviewed.   See PaceArt Report.  No changes today.    Hypertension  Stable on current regimen    Grief   Doing very  well overall. No syncope.   DM2 being managed by PCP and has  been able to come off some medications.   No current cardiac complaints  Current medicines are reviewed at length with the patient today.    Labs/ tests ordered today include:  Orders Placed This Encounter  Procedures   EKG 12-Lead   Disposition:   Follow up with Dr. Caryl Comes in 12 months   Signed, Shirley Friar, PA-C  06/28/2021 10:22 AM  [place] [address] [place] [address] [phone] (office) [phone] (fax)

## 2021-06-28 NOTE — Patient Instructions (Signed)
Medication Instructions:  Your physician recommends that you continue on your current medications as directed. Please refer to the Current Medication list given to you today.  *If you need a refill on your cardiac medications before your next appointment, please call your pharmacy*   Lab Work: None If you have labs (blood work) drawn today and your tests are completely normal, you will receive your results only by: . MyChart Message (if you have MyChart) OR . A paper copy in the mail If you have any lab test that is abnormal or we need to change your treatment, we will call you to review the results.   Follow-Up: At [facility], you and your health needs are our priority.  As part of our continuing mission to provide you with exceptional heart care, we have created designated Provider Care Teams.  These Care Teams include your primary Cardiologist (physician) and Advanced Practice Providers (APPs -  Physician Assistants and Nurse Practitioners) who all work together to provide you with the care you need, when you need it.  Your next appointment:   1 year(s)  The format for your next appointment:   In Person  Provider:   Steven Klein, MD   

## 2021-07-05 ENCOUNTER — Ambulatory Visit (INDEPENDENT_AMBULATORY_CARE_PROVIDER_SITE_OTHER): Payer: HMO

## 2021-07-05 DIAGNOSIS — I495 Sick sinus syndrome: Secondary | ICD-10-CM

## 2021-07-06 LAB — CUP PACEART REMOTE DEVICE CHECK
Battery Remaining Longevity: 35 mo
Battery Voltage: 2.96 V
Brady Statistic AP VP Percent: 24.09 %
Brady Statistic AP VS Percent: 0 %
Brady Statistic AS VP Percent: 75.9 %
Brady Statistic AS VS Percent: 0.01 %
Brady Statistic RA Percent Paced: 24.06 %
Brady Statistic RV Percent Paced: 99.88 %
Date Time Interrogation Session: 20230612174617
Implantable Lead Implant Date: 20160203
Implantable Lead Implant Date: 20160203
Implantable Lead Location: 753859
Implantable Lead Location: 753860
Implantable Lead Model: 5076
Implantable Lead Model: 5076
Implantable Pulse Generator Implant Date: 20160203
Lead Channel Impedance Value: 323 Ohm
Lead Channel Impedance Value: 437 Ohm
Lead Channel Impedance Value: 437 Ohm
Lead Channel Impedance Value: 456 Ohm
Lead Channel Pacing Threshold Amplitude: 0.625 V
Lead Channel Pacing Threshold Amplitude: 0.875 V
Lead Channel Pacing Threshold Pulse Width: 0.4 ms
Lead Channel Pacing Threshold Pulse Width: 0.4 ms
Lead Channel Sensing Intrinsic Amplitude: 15.75 mV
Lead Channel Sensing Intrinsic Amplitude: 15.75 mV
Lead Channel Sensing Intrinsic Amplitude: 2.375 mV
Lead Channel Sensing Intrinsic Amplitude: 2.375 mV
Lead Channel Setting Pacing Amplitude: 2 V
Lead Channel Setting Pacing Amplitude: 2.5 V
Lead Channel Setting Pacing Pulse Width: 0.4 ms
Lead Channel Setting Sensing Sensitivity: 2 mV

## 2021-07-21 ENCOUNTER — Telehealth: Payer: Self-pay | Admitting: Internal Medicine

## 2021-07-21 MED ORDER — FENOFIBRATE 145 MG PO TABS: 145.0000 mg | ORAL_TABLET | Freq: Every day | ORAL | 2 refills | Status: DC

## 2021-07-21 MED ORDER — AMLODIPINE BESYLATE 10 MG PO TABS: ORAL_TABLET | ORAL | 2 refills | Status: DC

## 2021-07-21 MED ORDER — PANTOPRAZOLE SODIUM 40 MG PO TBEC: 40.0000 mg | DELAYED_RELEASE_TABLET | Freq: Every day | ORAL | 2 refills | Status: DC

## 2021-07-21 MED ORDER — GLIPIZIDE ER 5 MG PO TB24: 5.0000 mg | ORAL_TABLET | Freq: Every day | ORAL | 2 refills | Status: DC

## 2021-07-21 NOTE — Telephone Encounter (Signed)
Caller & Relationship to patient: Lashica Crawford-Fewell  Call back number: [phone]  Date of last office visit: 05/11/21  Date of next office visit:   Medication(s) to be refilled:  amLODipine (NORVASC) 10 MG tablet fenofibrate (TRICOR) 145 MG tablet glipiZIDE (GLUCOTROL XL) 5 MG 24 hr tablet pantoprazole (PROTONIX) 40 MG tablet   Preferred Pharmacy:  [facility] #[id number] [place], [place] - [address] AT [facility] OF [facility] & [address] Phone:  [phone]  Fax:  [phone]

## 2021-07-26 ENCOUNTER — Telehealth: Payer: Self-pay | Admitting: Internal Medicine

## 2021-07-26 NOTE — Telephone Encounter (Signed)
1.Medication Requested: metoprolol succinate (TOPROL-XL) 100 MG 24 hr tablet 2. Pharmacy (Name, Street, [facility]): [facility] [phone] - [place], [place] - [address] AT [facility] OF [facility] & [address] Phone:  [phone]  Fax:  [phone]     3. On Med List: yes  4. Last Visit with PCP:  5. Next visit date with PCP:   Agent: Please be advised that RX refills may take up to 3 business days. We ask that you follow-up with your pharmacy.

## 2021-07-28 NOTE — Progress Notes (Signed)
Remote pacemaker transmission.   

## 2021-07-30 MED ORDER — METOPROLOL SUCCINATE ER 100 MG PO TB24
ORAL_TABLET | ORAL | 3 refills | Status: DC
Start: 2021-07-30 — End: 2022-07-25

## 2021-07-30 NOTE — Addendum Note (Signed)
Addended by: Zenovia Jordan A on: 07/30/2021 09:20 AM   Modules accepted: Orders

## 2021-07-30 NOTE — Telephone Encounter (Signed)
Pt is calling for an update on  metoprolol succinate (TOPROL-XL) 100 MG 24 hr tablet  Pharmacy: [facility] #[id number] - , [place] - [address] AT [facility] OF [facility] & [address]  LOV 05/11/21

## 2021-08-17 ENCOUNTER — Ambulatory Visit: Payer: HMO | Admitting: *Deleted

## 2021-08-17 DIAGNOSIS — E7800 Pure hypercholesterolemia, unspecified: Secondary | ICD-10-CM

## 2021-08-17 DIAGNOSIS — E1165 Type 2 diabetes mellitus with hyperglycemia: Secondary | ICD-10-CM

## 2021-08-17 NOTE — Chronic Care Management (AMB) (Signed)
Care Management    RN Visit Note  08/17/2021 Name: Stacey [name] MRN: [id number] DOB: [birth year]  Subjective: Stacey [name] is a 71 y.o. year old female who is a primary care patient of Stacey Borg, MD. The care management team was consulted for assistance with disease management and care coordination needs.    Engaged with patient by telephone for follow up visit/ RN CM case closure in response to provider referral for case management and/or care coordination services.   Consent to Services:   Stacey [name] was given information about Care Management services 12/02/20 including:  Care Management services includes personalized support from designated clinical staff supervised by her physician, including individualized plan of care and coordination with other care providers 24/7 contact phone numbers for assistance for urgent and routine care needs. The patient may stop case management services at any time by phone call to the office staff.  Patient agreed to services and consent obtained.   Assessment: Review of patient past medical history, allergies, medications, health status, including review of consultants reports, laboratory and other test data, was performed as part of comprehensive evaluation and provision of chronic care management services.   SDOH (Social Determinants of Health) assessments and interventions performed:  SDOH Interventions    Flowsheet Row Most Recent Value  SDOH Interventions   Food Insecurity Interventions Intervention Not Indicated  [continues to deny food insecurity]  Housing Interventions Intervention Not Indicated  Transportation Interventions Intervention Not Indicated  [continues to drive self]     Care Plan  Allergies  Allergen Reactions   Dairy Aid [Tilactase] Diarrhea and Other (See Comments)    Bloating and gastric distress   Metformin And Related Other (See Comments)    GI upset   Ace Inhibitors Other  (See Comments)    REACTION: angioedema right eye   Aspirin Other (See Comments)    Bleeding    Codeine Other (See Comments)    Hallucinations, can take if with someone.   Soy Allergy Other (See Comments)    Bleeding & cramps, diarrhea    Outpatient Encounter Medications as of 08/17/2021  Medication Sig   acetaminophen (TYLENOL) 500 MG tablet Take 500 mg by mouth daily as needed.   albuterol (VENTOLIN HFA) 108 (90 Base) MCG/ACT inhaler Inhale 2 puffs into the lungs every 6 (six) hours as needed for wheezing or shortness of breath.   amLODipine (NORVASC) 10 MG tablet TAKE 1 TABLET(10 MG) BY MOUTH DAILY   atorvastatin (LIPITOR) 80 MG tablet Take 1 tablet (80 mg total) by mouth daily.   benzonatate (TESSALON PERLES) 100 MG capsule 1-2 capsules up to twice daily as needed   Blood Glucose Monitoring Suppl (ONE TOUCH ULTRA 2) W/DEVICE KIT Use to check blood sugars daily Dx E11.9   cetirizine (ZYRTEC) 10 MG tablet Take 10 mg by mouth daily.   clonazePAM (KLONOPIN) 0.5 MG tablet TAKE 1 TABLET BY MOUTH TWICE DAILY AS NEEDED FOR ANXIETY   dicyclomine (BENTYL) 20 MG tablet Take 1 tablet (20 mg total) by mouth 3 (three) times daily between meals as needed for spasms.   Dulaglutide (TRULICITY) 1.5 OQ/9.4TM SOPN Inject 1.5 mg into the skin once a week.   fenofibrate (TRICOR) 145 MG tablet Take 1 tablet (145 mg total) by mouth daily.   glipiZIDE (GLUCOTROL XL) 5 MG 24 hr tablet Take 1 tablet (5 mg total) by mouth daily with breakfast.   glucose blood test strip 1 each by Other route daily. Use to check  blood sugar daily Dx e11.9   hydrochlorothiazide (MICROZIDE) 12.5 MG capsule Take 1 capsule (12.5 mg total) by mouth daily.   hydrocortisone (ANUSOL-HC) 2.5 % rectal cream Place 1 application rectally as needed for hemorrhoids or anal itching.   losartan (COZAAR) 100 MG tablet TAKE 1 TABLET BY MOUTH DAILY   metoprolol succinate (TOPROL-XL) 100 MG 24 hr tablet TAKE 1 TABLET(100 MG) BY MOUTH DAILY WITH OR  IMMEDIATELY FOLLOWING A MEAL   ondansetron (ZOFRAN ODT) 4 MG disintegrating tablet Take 1 tablet (4 mg total) by mouth every 8 (eight) hours as needed for nausea.   ONETOUCH DELICA LANCETS 96G MISC USE TO HELP CHECK BLOOD SUGAR DAILY   oxcarbazepine (TRILEPTAL) 600 MG tablet TAKE 1 TABLET(600 MG) BY MOUTH TWICE DAILY   pantoprazole (PROTONIX) 40 MG tablet Take 1 tablet (40 mg total) by mouth daily.   pioglitazone (ACTOS) 45 MG tablet TAKE 1 TABLET BY MOUTH DAILY   potassium chloride (KLOR-CON M) 10 MEQ tablet TAKE 2 TABLETS BY MOUTH DAILY   prochlorperazine (COMPAZINE) 10 MG tablet Take 1 tablet (10 mg total) by mouth every 6 (six) hours as needed for nausea or vomiting.   topiramate (TOPAMAX) 100 MG tablet TAKE 1 TABLET(100 MG) BY MOUTH DAILY   No facility-administered encounter medications on file as of 08/17/2021.   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   Hematochezia    Acute blood loss anemia    Fibrinogen decreased ([facility])    Lower GI bleed 12/21/2016   Acute gastroenteritis 12/31/2015   N&V (nausea and vomiting) 09/29/2015   Diarrhea 09/29/2015   Abdominal mass, RLQ (right lower quadrant) 09/04/2015   Preventative health care 09/04/2015   Wheezing 11/14/2014   S/P myomectomy 04/08/2014   Complete heart block ([place]) 02/26/2014   LINQ recorder  02/26/2014   Pacemaker-Medtronic  MRI compatible 02/26/2014   Nocturnal hypoxemia 01/30/2014   Pancreatitis 10/22/2013   Odynophagia 10/22/2013   Chest pain syndrome 10/21/2013   Syncope 12/18/2012   Loop recorder-LINQ 12/18/2012   Palpitations 09/12/2012   Midsternal chest pain 09/12/2012   Frequent PVCs  09/12/2012   Abdominal pain 07/24/2012   Polyarthralgia 07/24/2012   CAD (coronary artery disease) 05/04/2012   COPD (chronic obstructive pulmonary disease) ([place]) 05/04/2012   Angioedema 03/04/2012   Varicose veins of lower extremities with other complications 83/66/2947   Abnormal liver function test 09/07/2011   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   Trigeminal neuralgia 08/15/2011   Vertigo 07/27/2011   Diabetes ([place]) 08/30/2010   Encounter for long-term (current) use of high-risk medication 08/30/2010   Status post myomectomy 06/17/2010   LBBB (left bundle branch block) 65/46/5035   Diastolic heart failure ([place]) 04/06/2010   Diverticulosis of large intestine 04/06/2010   Diverticulosis 04/06/2010   RECTAL BLEEDING 04/06/2010   BRADYCARDIA 10/13/2009   ALLERGIC RHINITIS 12/07/2006   VENTRAL HERNIA 12/07/2006   COLONIC POLYPS, HX OF 12/07/2006   OBESITY 09/16/2006   ANXIETY 09/16/2006   DEPRESSION 09/16/2006   Hyperlipidemia 08/01/2006   Sickle cell anemia ([place]) 08/01/2006   Essential hypertension 08/01/2006   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   Asthma 08/01/2006   Conditions to be addressed/monitored: HLD and  DMII  Care Plan : RN Care Manager Plan of Care  Updates made by Knox Royalty, RN since 08/17/2021 12:00 AM     Problem: Chronic Disease Management Needs   Priority: High     Long-Range Goal: Ongoing adherence to established Plan of Care for long term chronic disease management   Start Date: 12/08/2020  Expected End Date: 12/08/2021  Priority: High  Note:   Current Barriers:  Chronic Disease Management support and education needs related to HLD and DMII  RNCM Clinical Goal(s):  Patient will demonstrate Ongoing health management independence DMII; HLD  through collaboration with RN Care manager, provider, and care team.   Interventions: 1:1 collaboration with primary care provider regarding development and update of comprehensive  plan of care as evidenced by provider attestation and co-signature Inter-disciplinary care team collaboration (see longitudinal plan of care) Evaluation of current treatment plan related to  self management and patient's adherence to plan as established by provider Review of patient status, including review of consultants reports, relevant laboratory and other test results, and medications completed SDOH updated: no new/ unmet concerns identified Pain assessment updated: denies pain today Medications discussed: reports continues to independently self-manage and denies current concerns/ issues/ questions around medications; endorses adherence to taking all medications as prescribed Reviewed upcoming scheduled provider appointments: 08/23/21- [facility]; confirms she is aware and has plans to attend as scheduled Discussed plans with patient for ongoing care management follow up- patient denies current care coordination/ care management needs and is agreeable to CCM RN CM case closure today; verbalizes understanding to contact PCP or other care providers for any needs that arise in the future, and confirms she has contact information for all care providers     Hyperlipidemia Interventions: GOAL STATUS: 08/17/21- Goal met; Long Term goal Reviewed importance of limiting foods high in cholesterol Reviewed exercise goals and target of 150 minutes per week  Diabetes Interventions: GOAL STATUS: 08/17/21- Goal met; Long Term goal Counseled on importance of regular laboratory monitoring as prescribed Advised patient, providing education and rationale, to check cbg fasting QD and record, calling PCP for findings outside established parameters Review of patient status, including review of consultants reports, relevant laboratory and other test results, and medications completed Assessed social determinant of health barriers Reviewed recent fasting blood sugars: reports consistent values at home between 90-120; reports  today's fasting at "101" Reinforced previously provided education around signs/ symptoms and corresponding action plan for hypoglycemia: she denies recent low values at home/ signs/ symptoms Confirmed patient rarely needs to take glipizide; states "haven't needed in a long while;" at PCP office visit 05/11/21, Dr. [name] advised to take only for blood sugars > 120  Reinforced previously provided education around dietary strategies for self- management of DMII: she continues to limit carbohydrates, sugar, and portions; also follows general heart healthy, low salt diet- positive reinforcement provided with encouragement to continue efforts Previously provided education around role of activity in setting of HLD/ HTN/ DMII reinforced- patient confirms she remains active at baseline and tells me today that she continues getting > 3000 steps in/day when she works: positive reinforcement provided with encouragement to continue efforts  Lab Results  Component Value Date   HGBA1C 6.5 05/11/2021     Plan:  No further follow up required: patient denies current care coordination/ care management needs and is agreeable to RN CM case closure today; case closure accordingly   [name], RN, BSN, [place] (  [phone]: direct office

## 2021-08-23 ENCOUNTER — Ambulatory Visit (INDEPENDENT_AMBULATORY_CARE_PROVIDER_SITE_OTHER): Payer: HMO

## 2021-08-23 DIAGNOSIS — Z0000 Encounter for general adult medical examination without abnormal findings: Secondary | ICD-10-CM | POA: Diagnosis not present

## 2021-08-23 NOTE — Progress Notes (Signed)
Subjective:   Stacey [name] is a 71 y.o. female who presents for Medicare Annual (Subsequent) preventive examination.   I connected with Stacey [name]  today by telephone and verified that I am speaking with the correct person using two identifiers. Location patient: home Location provider: work Persons participating in the virtual visit: patient, provider.   I discussed the limitations, risks, security and privacy concerns of performing an evaluation and management service by telephone and the availability of in person appointments. I also discussed with the patient that there may be a patient responsible charge related to this service. The patient expressed understanding and verbally consented to this telephonic visit.    Interactive audio and video telecommunications were attempted between this provider and patient, however failed, due to patient having technical difficulties OR patient did not have access to video capability.  We continued and completed visit with audio only.    Review of Systems     Cardiac Risk Factors include: advanced age (>48mn, >>42women);diabetes mellitus     Objective:    Today's Vitals   There is no height or weight on file to calculate BMI.     08/23/2021    8:59 AM 03/19/2020    2:44 PM 04/08/2019    4:01 PM 07/11/2018   12:56 PM 03/25/2018    3:12 AM 03/25/2018    3:01 AM 06/02/2017    4:19 PM  Advanced Directives  Does Patient Have a Medical Advance Directive? Yes Yes Yes Yes No No No  Type of AParamedicof [place] will Living will;Healthcare Power of [place] will [place] will     Does patient want to make changes to medical advance directive?  No - Patient declined No - Patient declined No - Patient declined     Copy of [place] Chart? No - copy requested No - copy requested       Would patient like  information on creating a medical advance directive?     No - Patient declined No - Patient declined No - Patient declined    Current Medications (verified) Outpatient Encounter Medications as of 08/23/2021  Medication Sig   acetaminophen (TYLENOL) 500 MG tablet Take 500 mg by mouth daily as needed.   albuterol (VENTOLIN HFA) 108 (90 Base) MCG/ACT inhaler Inhale 2 puffs into the lungs every 6 (six) hours as needed for wheezing or shortness of breath.   amLODipine (NORVASC) 10 MG tablet TAKE 1 TABLET(10 MG) BY MOUTH DAILY   atorvastatin (LIPITOR) 80 MG tablet Take 1 tablet (80 mg total) by mouth daily.   benzonatate (TESSALON PERLES) 100 MG capsule 1-2 capsules up to twice daily as needed   Blood Glucose Monitoring Suppl (ONE TOUCH ULTRA 2) W/DEVICE KIT Use to check blood sugars daily Dx E11.9   cetirizine (ZYRTEC) 10 MG tablet Take 10 mg by mouth daily.   clonazePAM (KLONOPIN) 0.5 MG tablet TAKE 1 TABLET BY MOUTH TWICE DAILY AS NEEDED FOR ANXIETY   dicyclomine (BENTYL) 20 MG tablet Take 1 tablet (20 mg total) by mouth 3 (three) times daily between meals as needed for spasms.   Dulaglutide (TRULICITY) 1.5 MYY/5.0PTSOPN Inject 1.5 mg into the skin once a week.   fenofibrate (TRICOR) 145 MG tablet Take 1 tablet (145 mg total) by mouth daily.   glipiZIDE (GLUCOTROL XL) 5 MG 24 hr tablet Take 1 tablet (5 mg total) by mouth daily with breakfast.  glucose blood test strip 1 each by Other route daily. Use to check blood sugar daily Dx e11.9   hydrochlorothiazide (MICROZIDE) 12.5 MG capsule Take 1 capsule (12.5 mg total) by mouth daily.   hydrocortisone (ANUSOL-HC) 2.5 % rectal cream Place 1 application rectally as needed for hemorrhoids or anal itching.   losartan (COZAAR) 100 MG tablet TAKE 1 TABLET BY MOUTH DAILY   metoprolol succinate (TOPROL-XL) 100 MG 24 hr tablet TAKE 1 TABLET(100 MG) BY MOUTH DAILY WITH OR IMMEDIATELY FOLLOWING A MEAL   ondansetron (ZOFRAN ODT) 4 MG disintegrating tablet Take  1 tablet (4 mg total) by mouth every 8 (eight) hours as needed for nausea.   ONETOUCH DELICA LANCETS 51V MISC USE TO HELP CHECK BLOOD SUGAR DAILY   oxcarbazepine (TRILEPTAL) 600 MG tablet TAKE 1 TABLET(600 MG) BY MOUTH TWICE DAILY   pantoprazole (PROTONIX) 40 MG tablet Take 1 tablet (40 mg total) by mouth daily.   pioglitazone (ACTOS) 45 MG tablet TAKE 1 TABLET BY MOUTH DAILY   potassium chloride (KLOR-CON M) 10 MEQ tablet TAKE 2 TABLETS BY MOUTH DAILY   prochlorperazine (COMPAZINE) 10 MG tablet Take 1 tablet (10 mg total) by mouth every 6 (six) hours as needed for nausea or vomiting.   topiramate (TOPAMAX) 100 MG tablet TAKE 1 TABLET(100 MG) BY MOUTH DAILY   No facility-administered encounter medications on file as of 08/23/2021.    Allergies (verified) Dairy aid [tilactase], Metformin and related, Ace inhibitors, Aspirin, Codeine, and Soy allergy   History: Past Medical History:  Diagnosis Date   ANGIOEDEMA 03/07/2008   a. with ACE-I   ANXIETY 09/16/2006   ASTHMA 08/01/2006   CAD (coronary artery disease)    a. 01/2010 : Minimal plaque at cardiac catheterization - CFX 20%, EF 70%;  b. 08/2012 Cath: Nl Cors, EF 65%.   Chronic diastolic CHF (congestive heart failure) ([place]) 04/06/2010   a. In setting of HOCM.   Complete heart block ([place]) 02/26/2014   a. 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 s/p septal myomectomy 4/12 at [facility] with Dr. Evelina Dun;  b. echo 5/12: EF 60-65%, LVOT peak 18 mmHg; grade 1 diast dysfxn, mild SAM (improved since myomectomy;  c. 03/2012 Echo: EF 60-65%, mod-sev basal septal asymm hypertrophy, basal septal HK, LVOT grad 81mHg, Gr 1 DD, , SAM, Mild MR, nl RV, PASP 39mg; 08/2012  Echo: technically difficult, doubt LVOT obstruction, EF 60%, Gr 1 DD, mod-sev dil LA.   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[place]   SICKLE CELL ANEMIA 08/01/2006   Type II or unspecified type diabetes mellitus without mention of complication, uncontrolled 08/30/2010   VENTRAL HERNIA 12/07/2006   Past Surgical History:  Procedure Laterality Date   ABDOMINAL HYSTERECTOMY  1987   CARDIAC SURGERY     COLONOSCOPY N/A 12/26/2016   Procedure: COLONOSCOPY;  Surgeon: JaMilus BanisterMD;  Location: [facility] END[facility]COPY;  Service: Endoscopy;  Laterality: N/A;   ELECTROPHYSIOLOGY STUDY N/A 09/12/2012   Procedure: ELECTROPHYSIOLOGY STUDY;  Surgeon: StDeboraha SprangMD;  [facility];  Service: Cardiovascular;  Laterality: N/A;   LEFT HEART CATH  Aug. 18, 2014  Medtronic heart device   LEFT HEART CATHETERIZATION WITH CORONARY ANGIOGRAM N/A 09/10/2012   Procedure: LEFT HEART CATHETERIZATION WITH CORONARY ANGIOGRAM;  Surgeon: Burnell Blanks, MD;  Location: [facility] LAB;  Service: Cardiovascular;  Laterality: N/A;   MYOMECTOMY     Septal   PERMANENT PACEMAKER INSERTION N/A 02/26/2014   MDT Advisa dual chamber pacemaker implanted by Dr Caryl Comes for heart block   VENTRAL HERNIA REPAIR  06/2006   Family History  Adopted: Yes  Problem Relation Age of Onset   Heart disease Father    Heart attack Father    Hypertension Father        before age 31   Colon cancer Daughter        and bleeding disorder   Hyperlipidemia Daughter    Heart disease Daughter    Heart attack Sister    Hyperlipidemia Sister    Stroke Sister    Hypertension Sister    Hyperlipidemia Brother    Hypertension Brother    Diabetes Paternal Grandmother    Clotting disorder Daughter    Clotting disorder Other        granddaughter   Hypertension Son    Social History   Socioeconomic History   Marital status: Widowed    Spouse name: Not on  file   Number of children: 3   Years of education: Not on file   Highest education level: Not on file  Occupational History   Occupation: retired Product manager: [employer]  Tobacco Use   Smoking status: Former    Packs/day: 0.25    Years: 20.00    Total pack years: 5.00    Types: Cigarettes    Quit date: 01/24/1998    Years since quitting: 23.5   Smokeless tobacco: Never   Tobacco comments:    Quit smoking 2001. Smoked on and off for 20 years. Smoked 2-3 cigars daily  Vaping Use   Vaping Use: Never used  Substance and Sexual Activity   Alcohol use: No    Alcohol/week: 0.0 standard drinks of alcohol   Drug use: No   Sexual activity: Not Currently  Other Topics Concern   Not on file  Social History Narrative   Lives in [place] with husband.  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   Social Determinants of Health   Financial Resource Strain: Low Risk  (08/23/2021)   Overall Financial Resource Strain (CARDIA)    Difficulty of Paying Living Expenses: Not hard at all  Food Insecurity: No Food Insecurity (08/23/2021)   Hunger Vital Sign    Worried About Running Out of Food in the Last Year: Never true    Ran Out of Food in the Last Year: Never true  Transportation Needs: No Transportation Needs (08/23/2021)   PRAPARE - Hydrologist (Medical): No    Lack of Transportation (Non-Medical): No  Physical Activity: Sufficiently Active (08/23/2021)   Exercise Vital Sign    Days of Exercise per Week: 3 days    Minutes of Exercise per Session: 60 min  Stress: No Stress Concern Present (08/23/2021)   [place]    Feeling of Stress : Not at all  Social Connections: Moderately Isolated (08/23/2021)   Social Connection and Isolation Panel [NHANES]    Frequency of Communication with Friends and Family: Three times a week    Frequency of Social Gatherings with  Friends and  Family: Three times a week    Attends Religious Services: Never    Active Member of Clubs or Organizations: Yes    Attends Archivist Meetings: 1 to 4 times per year    Marital Status: Widowed    Tobacco Counseling Counseling given: Not Answered Tobacco comments: Quit smoking 2001. Smoked on and off for 20 years. Smoked 2-3 cigars daily   Clinical Intake:  Pre-visit preparation completed: Yes  Pain : No/denies pain     Nutritional Risks: None Diabetes: Yes CBG done?: No Did pt. bring in CBG monitor from home?: No  How often do you need to have someone help you when you read instructions, pamphlets, or other written materials from your doctor or pharmacy?: 1 - Never What is the last grade level you completed in school?: master  Diabetic?yes Nutrition Risk Assessment:  Has the patient had any N/V/D within the last 2 months?  No  Does the patient have any non-healing wounds?  No  Has the patient had any unintentional weight loss or weight gain?  No   Diabetes:  Is the patient diabetic?  Yes  If diabetic, was a CBG obtained today?  No  Did the patient bring in their glucometer from home?  No  How often do you monitor your CBG's? Daily .   Financial Strains and Diabetes Management:  Are you having any financial strains with the device, your supplies or your medication? No .  Does the patient want to be seen by Chronic Care Management for management of their diabetes?  No  Would the patient like to be referred to a Nutritionist or for Diabetic Management?  No   Diabetic Exams:  Diabetic Eye Exam: Completed 08/2020 Diabetic Foot Exam: Overdue, Pt has been advised about the importance in completing this exam. Pt is scheduled for diabetic foot exam on next office visit .  Interpreter Needed?: No  Information entered by :: L.Wilson,LPN   Activities of Daily Living    08/23/2021    9:04 AM  In your present state of health, do you have any  difficulty performing the following activities:  Hearing? 0  Vision? 0  Difficulty concentrating or making decisions? 0  Walking or climbing stairs? 0  Dressing or bathing? 0  Doing errands, shopping? 0  Preparing Food and eating ? N  Using the Toilet? N  In the past six months, have you accidently leaked urine? N  Do you have problems with loss of bowel control? N  Managing your Medications? N  Managing your Finances? N  Housekeeping or managing your Housekeeping? N    Patient Care Team: Biagio Borg, MD as PCP - General (Internal Medicine) Deboraha Sprang, MD as PCP - Electrophysiology (Cardiology) Deboraha Sprang, MD as Consulting Physician (Cardiology) Gatha Mayer, MD as Consulting Physician (Gastroenterology) Willia Craze, NP as Nurse Practitioner (Gastroenterology)  Indicate any recent Medical Services you may have received from other than [facility] providers in the past year (date may be approximate).     Assessment:   This is a routine wellness examination for [place].  Hearing/Vision screen Vision Screening - Comments:: Annual eye exams wears glasses   Dietary issues and exercise activities discussed: Current Exercise Habits: Home exercise routine, Type of exercise: walking, Time (Minutes): 60, Frequency (Times/Week): 3, Weekly Exercise (Minutes/Week): 180, Intensity: Mild, Exercise limited by: None identified   Goals Addressed   None    Depression Screen    08/23/2021    9:00  AM 08/23/2021    8:56 AM 05/11/2021    9:39 AM 05/11/2021    9:03 AM 12/08/2020   11:30 AM 05/05/2020    8:58 AM 03/19/2020    2:43 PM  PHQ 2/9 Scores  PHQ - 2 Score 0 0 0 0 0 0 0    Fall Risk    08/23/2021    9:00 AM 08/17/2021    4:10 PM 05/11/2021    9:39 AM 05/11/2021    9:03 AM 03/02/2021   11:30 AM  Fall Risk   Falls in the past year? 0 0 0 0   Comment  continues to deny new/ recent falls x 12 months; does not use assistive devices   Denies new/ recent falls since last  outreach 12/08/21  Number falls in past yr: 0 0 0 0   Injury with Fall? 0 0 0 0   Comment  N/A- no falls reported     Risk for fall due to :  No Fall Risks   No Fall Risks  Follow up Falls evaluation completed;Education provided Falls prevention discussed   Falls prevention discussed    FALL RISK PREVENTION PERTAINING TO THE HOME:  Any stairs in or around the home? Yes  If so, are there any without handrails? No  Home free of loose throw rugs in walkways, pet beds, electrical cords, etc? Yes  Adequate lighting in your home to reduce risk of falls? Yes   ASSISTIVE DEVICES UTILIZED TO PREVENT FALLS:  Life alert? No  Use of a cane, walker or w/c? No  Grab bars in the bathroom? Yes  Shower chair or bench in shower? Yes  Elevated toilet seat or a handicapped toilet? No     Cognitive Function:    Normal cognitive status assessed by telephone conversation  by this Nurse Health Advisor. No abnormalities found.      08/23/2021    9:05 AM  6CIT Screen  What Year? 0 points  What month? 0 points  What time? 0 points  Count back from 20 0 points  Months in reverse 0 points  Repeat phrase 4 points  Total Score 4 points    Immunizations Immunization History  Administered Date(s) Administered   Fluad Quad(high Dose 65+) 11/04/2020   Influenza Whole 11/10/2008, 10/13/2009   Influenza, High Dose Seasonal PF 10/26/2016, 10/27/2017   Influenza,inj,Quad PF,6+ Mos 10/23/2013   Influenza-Unspecified 12/04/2014, 11/05/2019   PFIZER(Purple Top)SARS-COV-2 Vaccination 04/29/2019, 05/20/2019, 11/19/2019, 05/22/2020   Pfizer Covid-19 Vaccine Bivalent Booster 70yr & up 10/16/2020   Pneumococcal Conjugate-13 01/10/2017   Pneumococcal Polysaccharide-23 09/29/2005, 08/30/2010, 01/28/2014, 05/05/2020   Td 01/24/1993, 12/10/2008   Tdap 04/14/2020    TDAP status: Up to date  Flu Vaccine status: Up to date  Pneumococcal vaccine status: Up to date  Covid-19 vaccine status: Completed  vaccines  Qualifies for Shingles Vaccine? Yes   Zostavax completed No   Shingrix Completed?: No.    Education has been provided regarding the importance of this vaccine. Patient has been advised to call insurance company to determine out of pocket expense if they have not yet received this vaccine. Advised may also receive vaccine at local pharmacy or Health Dept. Verbalized acceptance and understanding.  Screening Tests Health Maintenance  Topic Date Due   Zoster Vaccines- Shingrix (1 of 2) Never done   OPHTHALMOLOGY EXAM  06/30/2020   INFLUENZA VACCINE  08/24/2021   HEMOGLOBIN A1C  11/10/2021   COLONOSCOPY (Pts 45-431yrInsurance coverage will need to be confirmed)  12/26/2021   MAMMOGRAM  03/22/2022   Diabetic kidney evaluation - GFR measurement  05/12/2022   Diabetic kidney evaluation - Urine ACR  05/12/2022   FOOT EXAM  05/12/2022   TETANUS/TDAP  04/15/2030   Pneumonia Vaccine 56+ Years old  Completed   DEXA SCAN  Completed   COVID-19 Vaccine  Completed   Hepatitis C Screening  Completed   HPV VACCINES  Aged Out    Health Maintenance  Health Maintenance Due  Topic Date Due   Zoster Vaccines- Shingrix (1 of 2) Never done   OPHTHALMOLOGY EXAM  06/30/2020  Colorectal cancer screening: Type of screening: Colonoscopy. Completed 12/26/2016. Repeat every 5 years  Mammogram status: Completed 03/22/2021. Repeat every year  Bone Density status: Completed 08/10/2017. Results reflect: Bone density results: OSTEOPENIA. Repeat every 5 years.  Lung Cancer Screening: (Low Dose CT Chest recommended if Age 3-80 years, 30 pack-year currently smoking OR have quit w/in 15years.) does not qualify.   Lung Cancer Screening Referral: n/a  Additional Screening:  Hepatitis C Screening: does not qualify; Completed 09/07/2011  Vision Screening: Recommended annual ophthalmology exams for early detection of glaucoma and other disorders of the eye. Is the patient up to date with their annual eye  exam?  Yes  Who is the provider or what is the name of the office in which the patient attends annual eye exams? Dr.Miller If pt is not established with a provider, would they like to be referred to a provider to establish care? No .   Dental Screening: Recommended annual dental exams for proper oral hygiene  Community Resource Referral / Chronic Care Management: CRR required this visit?  No   CCM required this visit?  No      Plan:     I have personally reviewed and noted the following in the patient's chart:   Medical and social history Use of alcohol, tobacco or illicit drugs  Current medications and supplements including opioid prescriptions.  Functional ability and status Nutritional status Physical activity Advanced directives List of other physicians Hospitalizations, surgeries, and ER visits in previous 12 months Vitals Screenings to include cognitive, depression, and falls Referrals and appointments  In addition, I have reviewed and discussed with patient certain preventive protocols, quality metrics, and best practice recommendations. A written personalized care plan for preventive services as well as general preventive health recommendations were provided to patient.     Daphane Shepherd, LPN   10/22/901   Nurse Notes: none

## 2021-08-23 NOTE — Patient Instructions (Signed)
[name] , Thank you for taking time to come for your Medicare Wellness Visit. I appreciate your ongoing commitment to your health goals. Please review the following plan we discussed and let me know if I can assist you in the future.   Screening recommendations/referrals: Colonoscopy: 12/26/2016 Mammogram: 03/22/2021 Bone Density: 08/10/2017 Recommended yearly ophthalmology/optometry visit for glaucoma screening and checkup Recommended yearly dental visit for hygiene and checkup  Vaccinations: Influenza vaccine: completed  Pneumococcal vaccine: completed  Tdap vaccine: 04/14/2020 Shingles vaccine: will consider     Advanced directives: yes   Conditions/risks identified: none   Next appointment: none    Preventive Care 65 Years and Older, Female Preventive care refers to lifestyle choices and visits with your health care provider that can promote health and wellness. What does preventive care include? A yearly physical exam. This is also called an annual well check. Dental exams once or twice a year. Routine eye exams. Ask your health care provider how often you should have your eyes checked. Personal lifestyle choices, including: Daily care of your teeth and gums. Regular physical activity. Eating a healthy diet. Avoiding tobacco and drug use. Limiting alcohol use. Practicing safe sex. Taking low-dose aspirin every day. Taking vitamin and mineral supplements as recommended by your health care provider. What happens during an annual well check? The services and screenings done by your health care provider during your annual well check will depend on your age, overall health, lifestyle risk factors, and family history of disease. Counseling  Your health care provider may ask you questions about your: Alcohol use. Tobacco use. Drug use. Emotional well-being. Home and relationship well-being. Sexual activity. Eating habits. History of falls. Memory and  ability to understand (cognition). Work and work Astronomer. Reproductive health. Screening  You may have the following tests or measurements: Height, weight, and BMI. Blood pressure. Lipid and cholesterol levels. These may be checked every 5 years, or more frequently if you are over 2 years old. Skin check. Lung cancer screening. You may have this screening every year starting at age 39 if you have a 30-pack-year history of smoking and currently smoke or have quit within the past 15 years. Fecal occult blood test (FOBT) of the stool. You may have this test every year starting at age 27. Flexible sigmoidoscopy or colonoscopy. You may have a sigmoidoscopy every 5 years or a colonoscopy every 10 years starting at age 26. Hepatitis C blood test. Hepatitis B blood test. Sexually transmitted disease (STD) testing. Diabetes screening. This is done by checking your blood sugar (glucose) after you have not eaten for a while (fasting). You may have this done every 1-3 years. Bone density scan. This is done to screen for osteoporosis. You may have this done starting at age 20. Mammogram. This may be done every 1-2 years. Talk to your health care provider about how often you should have regular mammograms. Talk with your health care provider about your test results, treatment options, and if necessary, the need for more tests. Vaccines  Your health care provider may recommend certain vaccines, such as: Influenza vaccine. This is recommended every year. Tetanus, diphtheria, and acellular pertussis (Tdap, Td) vaccine. You may need a Td booster every 10 years. Zoster vaccine. You may need this after age 71. Pneumococcal 13-valent conjugate (PCV13) vaccine. One dose is recommended after age 9. Pneumococcal polysaccharide (PPSV23) vaccine. One dose is recommended after age 55. Talk to your health care provider about which screenings and vaccines you need and how often  you need them. This information is  not intended to replace advice given to you by your health care provider. Make sure you discuss any questions you have with your health care provider. Document Released: 02/06/2015 Document Revised: 09/30/2015 Document Reviewed: 11/11/2014 Elsevier Interactive Patient Education  2017 [place] Prevention in the Home Falls can cause injuries. They can happen to people of all ages. There are many things you can do to make your home safe and to help prevent falls. What can I do on the outside of my home? Regularly fix the edges of walkways and driveways and fix any cracks. Remove anything that might make you trip as you walk through a door, such as a raised step or threshold. Trim any bushes or trees on the path to your home. Use bright outdoor lighting. Clear any walking paths of anything that might make someone trip, such as rocks or tools. Regularly check to see if handrails are loose or broken. Make sure that both sides of any steps have handrails. Any raised decks and porches should have guardrails on the edges. Have any leaves, snow, or ice cleared regularly. Use sand or salt on walking paths during winter. Clean up any spills in your garage right away. This includes oil or grease spills. What can I do in the bathroom? Use night lights. Install grab bars by the toilet and in the tub and shower. Do not use towel bars as grab bars. Use non-skid mats or decals in the tub or shower. If you need to sit down in the shower, use a plastic, non-slip stool. Keep the floor dry. Clean up any water that spills on the floor as soon as it happens. Remove soap buildup in the tub or shower regularly. Attach bath mats securely with double-sided non-slip rug tape. Do not have throw rugs and other things on the floor that can make you trip. What can I do in the bedroom? Use night lights. Make sure that you have a light by your bed that is easy to reach. Do not use any sheets or blankets that  are too big for your bed. They should not hang down onto the floor. Have a firm chair that has side arms. You can use this for support while you get dressed. Do not have throw rugs and other things on the floor that can make you trip. What can I do in the kitchen? Clean up any spills right away. Avoid walking on wet floors. Keep items that you use a lot in easy-to-reach places. If you need to reach something above you, use a strong step stool that has a grab bar. Keep electrical cords out of the way. Do not use floor polish or wax that makes floors slippery. If you must use wax, use non-skid floor wax. Do not have throw rugs and other things on the floor that can make you trip. What can I do with my stairs? Do not leave any items on the stairs. Make sure that there are handrails on both sides of the stairs and use them. Fix handrails that are broken or loose. Make sure that handrails are as long as the stairways. Check any carpeting to make sure that it is firmly attached to the stairs. Fix any carpet that is loose or worn. Avoid having throw rugs at the top or bottom of the stairs. If you do have throw rugs, attach them to the floor with carpet tape. Make sure that you have a light  switch at the top of the stairs and the bottom of the stairs. If you do not have them, ask someone to add them for you. What else can I do to help prevent falls? Wear shoes that: Do not have high heels. Have rubber bottoms. Are comfortable and fit you well. Are closed at the toe. Do not wear sandals. If you use a stepladder: Make sure that it is fully opened. Do not climb a closed stepladder. Make sure that both sides of the stepladder are locked into place. Ask someone to hold it for you, if possible. Clearly mark and make sure that you can see: Any grab bars or handrails. First and last steps. Where the edge of each step is. Use tools that help you move around (mobility aids) if they are needed. These  include: Canes. Walkers. Scooters. Crutches. Turn on the lights when you go into a dark area. Replace any light bulbs as soon as they burn out. Set up your furniture so you have a clear path. Avoid moving your furniture around. If any of your floors are uneven, fix them. If there are any pets around you, be aware of where they are. Review your medicines with your doctor. Some medicines can make you feel dizzy. This can increase your chance of falling. Ask your doctor what other things that you can do to help prevent falls. This information is not intended to replace advice given to you by your health care provider. Make sure you discuss any questions you have with your health care provider. Document Released: 11/06/2008 Document Revised: 06/18/2015 Document Reviewed: 02/14/2014 Elsevier Interactive Patient Education  2017 [employer].

## 2021-09-08 ENCOUNTER — Other Ambulatory Visit: Payer: Self-pay

## 2021-09-08 MED ORDER — LOSARTAN POTASSIUM 100 MG PO TABS: 100.0000 mg | ORAL_TABLET | Freq: Every day | ORAL | 1 refills | Status: DC

## 2021-09-09 ENCOUNTER — Other Ambulatory Visit: Payer: Self-pay

## 2021-09-09 MED ORDER — HYDROCHLOROTHIAZIDE 12.5 MG PO CAPS: 12.5000 mg | ORAL_CAPSULE | Freq: Every day | ORAL | 1 refills | Status: DC

## 2021-10-04 ENCOUNTER — Ambulatory Visit (INDEPENDENT_AMBULATORY_CARE_PROVIDER_SITE_OTHER): Payer: HMO

## 2021-10-04 DIAGNOSIS — I495 Sick sinus syndrome: Secondary | ICD-10-CM | POA: Diagnosis not present

## 2021-10-06 LAB — CUP PACEART REMOTE DEVICE CHECK
Battery Remaining Longevity: 32 mo
Battery Voltage: 2.95 V
Brady Statistic AP VP Percent: 24.6 %
Brady Statistic AP VS Percent: 0 %
Brady Statistic AS VP Percent: 75.37 %
Brady Statistic AS VS Percent: 0.03 %
Brady Statistic RA Percent Paced: 24.55 %
Brady Statistic RV Percent Paced: 99.8 %
Date Time Interrogation Session: 20230911222242
Implantable Lead Implant Date: 20160203
Implantable Lead Implant Date: 20160203
Implantable Lead Location: 753859
Implantable Lead Location: 753860
Implantable Lead Model: 5076
Implantable Lead Model: 5076
Implantable Pulse Generator Implant Date: 20160203
Lead Channel Impedance Value: 342 Ohm
Lead Channel Impedance Value: 418 Ohm
Lead Channel Impedance Value: 437 Ohm
Lead Channel Impedance Value: 437 Ohm
Lead Channel Pacing Threshold Amplitude: 0.625 V
Lead Channel Pacing Threshold Amplitude: 0.75 V
Lead Channel Pacing Threshold Pulse Width: 0.4 ms
Lead Channel Pacing Threshold Pulse Width: 0.4 ms
Lead Channel Sensing Intrinsic Amplitude: 19.125 mV
Lead Channel Sensing Intrinsic Amplitude: 19.125 mV
Lead Channel Sensing Intrinsic Amplitude: 2.125 mV
Lead Channel Sensing Intrinsic Amplitude: 2.125 mV
Lead Channel Setting Pacing Amplitude: 2 V
Lead Channel Setting Pacing Amplitude: 2.5 V
Lead Channel Setting Pacing Pulse Width: 0.4 ms
Lead Channel Setting Sensing Sensitivity: 2 mV

## 2021-10-21 ENCOUNTER — Other Ambulatory Visit: Payer: Self-pay | Admitting: Internal Medicine

## 2021-10-21 NOTE — Progress Notes (Signed)
Remote pacemaker transmission.   

## 2021-11-06 ENCOUNTER — Encounter: Payer: Self-pay | Admitting: Internal Medicine

## 2021-11-15 ENCOUNTER — Other Ambulatory Visit: Payer: Self-pay | Admitting: Internal Medicine

## 2021-11-22 ENCOUNTER — Telehealth: Payer: HMO

## 2021-11-24 ENCOUNTER — Telehealth: Payer: Self-pay | Admitting: *Deleted

## 2021-11-24 ENCOUNTER — Encounter: Payer: Self-pay | Admitting: Internal Medicine

## 2021-11-24 NOTE — Telephone Encounter (Signed)
[employer] patient assistant Re-enrollment period ends 01/23/2022. Pt been notified w/ application  instructions. Pt voiced understanding.

## 2021-12-20 ENCOUNTER — Other Ambulatory Visit: Payer: Self-pay

## 2021-12-20 DIAGNOSIS — E7800 Pure hypercholesterolemia, unspecified: Secondary | ICD-10-CM

## 2021-12-20 MED ORDER — ATORVASTATIN CALCIUM 80 MG PO TABS: 80.0000 mg | ORAL_TABLET | Freq: Every day | ORAL | 3 refills | Status: DC

## 2021-12-21 ENCOUNTER — Other Ambulatory Visit: Payer: Self-pay | Admitting: Internal Medicine

## 2021-12-21 NOTE — Telephone Encounter (Signed)
Please refill as per office routine med refill policy (all routine meds to be refilled for 3 mo or monthly (per pt preference) up to one year from last visit, then month to month grace period for 3 mo, then further med refills will have to be denied) ? ?

## 2022-01-03 ENCOUNTER — Ambulatory Visit (INDEPENDENT_AMBULATORY_CARE_PROVIDER_SITE_OTHER): Payer: PPO

## 2022-01-03 DIAGNOSIS — I442 Atrioventricular block, complete: Secondary | ICD-10-CM | POA: Diagnosis not present

## 2022-01-04 LAB — CUP PACEART REMOTE DEVICE CHECK
Battery Remaining Longevity: 29 mo
Battery Voltage: 2.94 V
Brady Statistic AP VP Percent: 23.84 %
Brady Statistic AP VS Percent: 0.01 %
Brady Statistic AS VP Percent: 75.48 %
Brady Statistic AS VS Percent: 0.67 %
Brady Statistic RA Percent Paced: 23.83 %
Brady Statistic RV Percent Paced: 99.29 %
Date Time Interrogation Session: 20231212153925
Implantable Lead Connection Status: 753985
Implantable Lead Connection Status: 753985
Implantable Lead Implant Date: 20160203
Implantable Lead Implant Date: 20160203
Implantable Lead Location: 753859
Implantable Lead Location: 753860
Implantable Lead Model: 5076
Implantable Lead Model: 5076
Implantable Pulse Generator Implant Date: 20160203
Lead Channel Impedance Value: 342 Ohm
Lead Channel Impedance Value: 418 Ohm
Lead Channel Impedance Value: 437 Ohm
Lead Channel Impedance Value: 437 Ohm
Lead Channel Pacing Threshold Amplitude: 0.625 V
Lead Channel Pacing Threshold Amplitude: 0.75 V
Lead Channel Pacing Threshold Pulse Width: 0.4 ms
Lead Channel Pacing Threshold Pulse Width: 0.4 ms
Lead Channel Sensing Intrinsic Amplitude: 10.875 mV
Lead Channel Sensing Intrinsic Amplitude: 10.875 mV
Lead Channel Sensing Intrinsic Amplitude: 2.5 mV
Lead Channel Sensing Intrinsic Amplitude: 2.5 mV
Lead Channel Setting Pacing Amplitude: 2.25 V
Lead Channel Setting Pacing Amplitude: 2.5 V
Lead Channel Setting Pacing Pulse Width: 0.4 ms
Lead Channel Setting Sensing Sensitivity: 2 mV
Zone Setting Status: 755011
Zone Setting Status: 755011

## 2022-02-07 NOTE — Progress Notes (Signed)
Remote pacemaker transmission.   

## 2022-03-07 ENCOUNTER — Other Ambulatory Visit: Payer: Self-pay | Admitting: Internal Medicine

## 2022-03-07 NOTE — Telephone Encounter (Signed)
Please refill as per office routine med refill policy (all routine meds to be refilled for 3 mo or monthly (per pt preference) up to one year from last visit, then month to month grace period for 3 mo, then further med refills will have to be denied) ? ?

## 2022-03-11 ENCOUNTER — Other Ambulatory Visit: Payer: Self-pay

## 2022-03-11 MED ORDER — HYDROCHLOROTHIAZIDE 12.5 MG PO CAPS: 12.5000 mg | ORAL_CAPSULE | Freq: Every day | ORAL | 1 refills | Status: DC

## 2022-03-22 ENCOUNTER — Ambulatory Visit (INDEPENDENT_AMBULATORY_CARE_PROVIDER_SITE_OTHER): Payer: PPO | Admitting: Internal Medicine

## 2022-03-22 VITALS — BP 136/78 | HR 74 | Temp 98.8°F | Ht 69.0 in | Wt 197.0 lb

## 2022-03-22 DIAGNOSIS — I10 Essential (primary) hypertension: Secondary | ICD-10-CM | POA: Diagnosis not present

## 2022-03-22 DIAGNOSIS — G500 Trigeminal neuralgia: Secondary | ICD-10-CM | POA: Diagnosis not present

## 2022-03-22 DIAGNOSIS — E1165 Type 2 diabetes mellitus with hyperglycemia: Secondary | ICD-10-CM | POA: Diagnosis not present

## 2022-03-22 DIAGNOSIS — N1831 Chronic kidney disease, stage 3a: Secondary | ICD-10-CM | POA: Diagnosis not present

## 2022-03-22 DIAGNOSIS — E538 Deficiency of other specified B group vitamins: Secondary | ICD-10-CM

## 2022-03-22 DIAGNOSIS — Z23 Encounter for immunization: Secondary | ICD-10-CM

## 2022-03-22 DIAGNOSIS — E559 Vitamin D deficiency, unspecified: Secondary | ICD-10-CM | POA: Diagnosis not present

## 2022-03-22 DIAGNOSIS — Z0001 Encounter for general adult medical examination with abnormal findings: Secondary | ICD-10-CM

## 2022-03-22 DIAGNOSIS — E7800 Pure hypercholesterolemia, unspecified: Secondary | ICD-10-CM

## 2022-03-22 MED ORDER — CARBAMAZEPINE ER 100 MG PO TB12: 100.0000 mg | ORAL_TABLET | Freq: Two times a day (BID) | ORAL | 5 refills | Status: DC

## 2022-03-22 NOTE — Patient Instructions (Signed)
You had the flu shot today  Please take all new medication as prescribed - the generic tegretol  Please continue all other medications as before, and refills have been done if requested.  Please have the pharmacy call with any other refills you may need.  Please continue your efforts at being more active, low cholesterol diet, and weight control.  You are otherwise up to date with prevention measures today.  Please keep your appointments with your specialists as you may have planned  You will be contacted regarding the referral for: Neurosurgury - Dr Myrle Sheng [place]  Please go to the LAB at the blood drawing area for the tests to be done  You will be contacted by phone if any changes need to be made immediately.  Otherwise, you will receive a letter about your results with an explanation, but please check with MyChart first.  Please remember to sign up for MyChart if you have not done so, as this will be important to you in the future with finding out test results, communicating by private email, and scheduling acute appointments online when needed.  Please make an Appointment to return in 6 months, or sooner if needed

## 2022-03-22 NOTE — Progress Notes (Signed)
Patient ID: Stacey [name], female   DOB: [birth year], 72 y.o.   MRN: [id number]         Chief Complaint:: wellness exam and referral for neurologist  With trigeminal neuralgia, dm, htn, hld       HPI:  Stacey [name] is a 72 y.o. female here for wellness exam; has been scheduled for mamogram, waiting on dental appt , aolso has appt for eye exam soon; declines covid booster, colonoscopy but will consider shingrix at pharmacy , for flu shot godya, o/w up to date                        Also has recurrence of 3 days severe right sided facial pain mostly upper and mid face, wanted to get started on referral to NS Pt denies chest pain, increased sob or doe, wheezing, orthopnea, PND, increased LE swelling, palpitations, dizziness or syncope.   Pt denies polydipsia, polyuria, or new focal neuro s/s.    Pt denies fever, wt loss, night sweats, loss of appetite, or other constitutional symptoms     Wt Readings from Last 3 Encounters:  03/22/22 197 lb (89.4 kg)  06/28/21 197 lb (89.4 kg)  05/11/21 198 lb (89.8 kg)   BP Readings from Last 3 Encounters:  03/22/22 136/78  06/28/21 110/62  05/11/21 136/72   Immunization History  Administered Date(s) Administered   Fluad Quad(high Dose 65+) 11/04/2020, 03/22/2022   Influenza Whole 11/10/2008, 10/13/2009   Influenza, High Dose Seasonal PF 10/26/2016, 10/27/2017   Influenza,inj,Quad PF,6+ Mos 10/23/2013   Influenza-Unspecified 12/04/2014, 11/05/2019   PFIZER(Purple Top)SARS-COV-2 Vaccination 04/29/2019, 05/20/2019, 11/19/2019, 05/22/2020   Pfizer Covid-19 Vaccine Bivalent Booster 57yr & up 10/16/2020   Pneumococcal Conjugate-13 01/10/2017   Pneumococcal Polysaccharide-23 09/29/2005, 08/30/2010, 01/28/2014, 05/05/2020   Td 01/24/1993, 12/10/2008   Tdap 04/14/2020   Health Maintenance Due  Topic Date Due   MAMMOGRAM  03/22/2022      Past Medical History:  Diagnosis Date   ANGIOEDEMA 03/07/2008   a. with ACE-I   ANXIETY  09/16/2006   ASTHMA 08/01/2006   CAD (coronary artery disease)    a. 01/2010 : Minimal plaque at cardiac catheterization - CFX 20%, EF 70%;  b. 08/2012 Cath: Nl Cors, EF 65%.   Chronic diastolic CHF ([place]estive heart failure) [place] 04/06/[place]   a. In setting of HOCM.   [place] 02/26[place]   a. 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 [facility]  b. echo 5/12: EF 60-65%, LVOT peak 18 mmHg; grade 1 diast dysfxn, mild SAM (improved since myomectomy;  c. 03/2012 Echo: EF 60-65%, mod-sev basal septal asymm hypertrophy, basal septal HK, LVOT grad 654mg, Gr 1 DD, , SAM, Mild MR, nl RV, PASP 3525m; 08/2012 Echo: technically difficult, doubt LVOT obstruction, EF 60%, Gr 1 DD, mod-sev dil LA.   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  SICKLE CELL ANEMIA 08/01/2006   Type II or unspecified type diabetes mellitus without mention of complication, uncontrolled 08/30/2010   VENTRAL HERNIA 12/07/2006   Past Surgical History:  Procedure  Laterality Date   ABDOMINAL HYSTERECTOMY  1987   CARDIAC SURGERY     COLONOSCOPY N/A 12/26/2016   Procedure: COLONOSCOPY;  Surgeon: Milus Banister, MD;  Location: [facility] ENDOSCOPY;  Service: Endoscopy;  Laterality: N/A;   ELECTROPHYSIOLOGY STUDY N/A 09/12/2012   Procedure: ELECTROPHYSIOLOGY STUDY;  Surgeon: Deboraha Sprang, MD;  Location: [facility] CATH LAB;  Service: Cardiovascular;  Laterality: N/A;   LEFT HEART CATH  Aug. 18, 2014   Medtronic heart device   LEFT HEART CATHETERIZATION WITH CORONARY ANGIOGRAM N/A 09/10/2012   Procedure: LEFT HEART  CATHETERIZATION WITH CORONARY ANGIOGRAM;  Surgeon: Burnell Blanks, MD;  Location: [facility] CATH LAB;  Service: Cardiovascular;  Laterality: N/A;   MYOMECTOMY     Septal   PERMANENT PACEMAKER INSERTION N/A 02/26/2014   MDT Advisa dual chamber pacemaker implanted by Dr Caryl Comes for heart block   VENTRAL HERNIA REPAIR  06/2006    reports that she quit smoking about 24 years ago. Her smoking use included cigarettes. She has a 5.00 pack-year smoking history. She has never used smokeless tobacco. She reports that she does not drink alcohol and does not use drugs. family history includes Clotting disorder in her daughter and another family member; Colon cancer in her daughter; Diabetes in her paternal grandmother; Heart attack in her father and sister; Heart disease in her daughter and father; Hyperlipidemia in her brother, daughter, and sister; Hypertension in her brother, father, sister, and son; Stroke in her sister. She was adopted. Allergies  Allergen Reactions   Dairy Aid [Tilactase] Diarrhea and Other (See Comments)    Bloating and gastric distress   Metformin And Related Other (See Comments)    GI upset   Ace Inhibitors Other (See Comments)    REACTION: angioedema right eye   Aspirin Other (See Comments)    Bleeding    Codeine Other (See Comments)    Hallucinations, can take if with someone.   Soy Allergy Other (See Comments)    Bleeding & cramps, diarrhea    Current Outpatient Medications on File Prior to Visit  Medication Sig Dispense Refill   acetaminophen (TYLENOL) 500 MG tablet Take 500 mg by mouth daily as needed.     albuterol (VENTOLIN HFA) 108 (90 Base) MCG/ACT inhaler Inhale 2 puffs into the lungs every 6 (six) hours as needed for wheezing or shortness of breath. 18 g 5   amLODipine (NORVASC) 10 MG tablet TAKE 1 TABLET(10 MG) BY MOUTH DAILY 90 tablet 2   atorvastatin (LIPITOR) 80 MG tablet Take 1 tablet (80 mg total) by mouth daily. 90 tablet 3   benzonatate (TESSALON PERLES)  100 MG capsule 1-2 capsules up to twice daily as needed 40 capsule 0   Blood Glucose Monitoring Suppl (ONE TOUCH ULTRA 2) W/DEVICE KIT Use to check blood sugars daily Dx E11.9 1 each 0   cetirizine (ZYRTEC) 10 MG tablet Take 10 mg by mouth daily.     clonazePAM (KLONOPIN) 0.5 MG tablet TAKE 1 TABLET BY MOUTH TWICE DAILY AS NEEDED FOR ANXIETY 30 tablet 2   dicyclomine (BENTYL) 20 MG tablet Take 1 tablet (20 mg total) by mouth 3 (three) times daily between meals as needed for spasms. 60 tablet 3   Dulaglutide (TRULICITY) 1.5 0000000 SOPN Inject 1.5 mg into the skin once a week. 6 mL 3   fenofibrate (TRICOR) 145 MG tablet Take 1 tablet (145 mg total) by mouth daily. 90 tablet 2   glipiZIDE (GLUCOTROL XL) 5 MG 24 hr tablet Take 1  tablet (5 mg total) by mouth daily with breakfast. 90 tablet 2   glucose blood test strip 1 each by Other route daily. Use to check blood sugar daily Dx e11.9 100 each 11   hydrochlorothiazide (MICROZIDE) 12.5 MG capsule Take 1 capsule (12.5 mg total) by mouth daily. 90 capsule 1   hydrocortisone (ANUSOL-HC) 2.5 % rectal cream Place 1 application rectally as needed for hemorrhoids or anal itching.     losartan (COZAAR) 100 MG tablet TAKE 1 TABLET(100 MG) BY MOUTH DAILY 90 tablet 1   metoprolol succinate (TOPROL-XL) 100 MG 24 hr tablet TAKE 1 TABLET(100 MG) BY MOUTH DAILY WITH OR IMMEDIATELY FOLLOWING A MEAL 90 tablet 3   ondansetron (ZOFRAN ODT) 4 MG disintegrating tablet Take 1 tablet (4 mg total) by mouth every 8 (eight) hours as needed for nausea. 10 tablet 0   ONETOUCH DELICA LANCETS 99991111 MISC USE TO HELP CHECK BLOOD SUGAR DAILY 200 each 0   oxcarbazepine (TRILEPTAL) 600 MG tablet TAKE 1 TABLET(600 MG) BY MOUTH TWICE DAILY 180 tablet 1   pantoprazole (PROTONIX) 40 MG tablet Take 1 tablet (40 mg total) by mouth daily. 90 tablet 2   pioglitazone (ACTOS) 45 MG tablet TAKE 1 TABLET BY MOUTH DAILY 90 tablet 1   potassium chloride (KLOR-CON M) 10 MEQ tablet TAKE 2 TABLETS BY  MOUTH DAILY 180 tablet 1   prochlorperazine (COMPAZINE) 10 MG tablet Take 1 tablet (10 mg total) by mouth every 6 (six) hours as needed for nausea or vomiting. 30 tablet 1   topiramate (TOPAMAX) 100 MG tablet TAKE 1 TABLET(100 MG) BY MOUTH DAILY 90 tablet 1   No current facility-administered medications on file prior to visit.        ROS:  All others reviewed and negative.  Objective        PE:  BP 136/78 (BP Location: Left Arm, Patient Position: Sitting, Cuff Size: Large)   Pulse 74   Temp 98.8 F (37.1 C) (Oral)   Ht '5\' 9"'$  (1.753 m)   Wt 197 lb (89.4 kg)   SpO2 93%   BMI 29.09 kg/m                 Constitutional: Pt appears in NAD               HENT: Head: NCAT.                Right Ear: External ear normal.                 Left Ear: External ear normal.                Eyes: . Pupils are equal, round, and reactive to light. Conjunctivae and EOM are normal               Nose: without d/c or deformity               Neck: Neck supple. Gross normal ROM               Cardiovascular: Normal rate and regular rhythm.                 Pulmonary/Chest: Effort normal and breath sounds without rales or wheezing.                Abd:  Soft, NT, ND, + BS, no organomegaly               Neurological: Pt is alert. At  baseline orientation, motor grossly intact               Skin: Skin is warm. No rashes, no other new lesions, LE edema - none               Psychiatric: Pt behavior is normal without agitation   Micro: none  Cardiac tracings I have personally interpreted today:  none  Pertinent Radiological findings (summarize): none   Lab Results  Component Value Date   WBC 8.0 03/22/2022   HGB 12.4 03/22/2022   HCT 38.6 03/22/2022   PLT 243.0 03/22/2022   GLUCOSE 80 03/22/2022   CHOL 160 03/22/2022   TRIG 105.0 03/22/2022   HDL 59.20 03/22/2022   LDLDIRECT 98.0 08/10/2018   LDLCALC 80 03/22/2022   ALT 11 03/22/2022   AST 16 03/22/2022   NA 141 03/22/2022   K 4.3 03/22/2022   CL  105 03/22/2022   CREATININE 1.15 03/22/2022   BUN 22 03/22/2022   CO2 28 03/22/2022   TSH 0.37 03/22/2022   INR 1.35 12/23/2016   HGBA1C 6.5 03/22/2022   MICROALBUR 4.1 (H) 03/22/2022   Assessment/Plan:  Stacey [name] is a 72 y.o. Black or African American [2] female with  has a past medical history of ANGIOEDEMA (03/07/2008), ANXIETY (09/16/2006), ASTHMA (08/01/2006), CAD (coronary artery disease), Chronic diastolic CHF (congestive heart failure) ([place]) (04/06/2010), Complete heart block ([place]) (02/26/2014), COPD (chronic obstructive pulmonary disease) ([place]), DEPRESSION (09/16/2006), DIVERTICULOSIS, COLON, WITH HEMORRHAGE (04/06/2010), DM (diabetes mellitus) in pregnancy, delivered w/postpartum condition (08/30/2010), HYPERLIPIDEMIA (08/01/2006), HYPERTENSION (08/01/2006), Hypertr obst cardiomyop (08/01/2006), Impaired glucose tolerance (06/25/2010), LBBB (left bundle branch block) (06/17/2010), OBESITY (09/16/2006), 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  Encounter for well adult exam with abnormal findings Age and sex appropriate education and counseling updated with regular exercise and diet Referrals for preventative services - pt for mammogram dental appt and eye exam soon, declines colonoscopy Immunizations addressed - declines covid booster, for flu shot Smoking counseling  - none needed Evidence for depression or other mood disorder - none significant Most recent labs reviewed. I have personally reviewed and have noted: 1) the patient's medical and social history 2) The patient's current medications and supplements 3) The patient's height, weight, and BMI have been recorded in the chart   Trigeminal neuralgia Now recurred, for tegretol start, and refer NS Dr Myrle Sheng [place]  Diabetes [facility]) Lab Results  Component Value Date   HGBA1C 6.5  03/22/2022   Stable, pt to continue current medical treatment trulicity 1.5 weekly, glipizide 5 mg qd, actos 45 mg qd   Essential hypertension BP Readings from Last 3 Encounters:  03/22/22 136/78  06/28/21 110/62  05/11/21 136/72   Stable, pt to continue medical treatment norvasc 10 mg qd   Hyperlipidemia Lab Results  Component Value Date   LDLCALC 80 03/22/2022   Stable, pt to continue current statin lipitor 80 mg and tricor 145 mg qd and lower chol diet , declines other change   Stage 3a chronic kidney disease ([place]) Lab Results  Component Value Date   CREATININE 1.15 03/22/2022   Stable overall, cont to avoid nephrotoxins   Vitamin D deficiency Last vitamin D Lab Results  Component Value Date   VD25OH 38.04 03/22/2022   Low, to start oral replacement  Followup: Return in about 6 months (around 09/20/2022).  Cathlean Cower, MD 03/25/2022 9:51 PM [place]  [facility]

## 2022-03-23 LAB — CBC WITH DIFFERENTIAL/PLATELET
Basophils Absolute: 0.1 10*3/uL (ref 0.0–0.1)
Basophils Relative: 0.7 % (ref 0.0–3.0)
Eosinophils Absolute: 0.1 10*3/uL (ref 0.0–0.7)
Eosinophils Relative: 1.1 % (ref 0.0–5.0)
HCT: 38.6 % (ref 36.0–46.0)
Hemoglobin: 12.4 g/dL (ref 12.0–15.0)
Lymphocytes Relative: 20.2 % (ref 12.0–46.0)
Lymphs Abs: 1.6 10*3/uL (ref 0.7–4.0)
MCHC: 32.1 g/dL (ref 30.0–36.0)
MCV: 77.3 fl — ABNORMAL LOW (ref 78.0–100.0)
Monocytes Absolute: 0.5 10*3/uL (ref 0.1–1.0)
Monocytes Relative: 6 % (ref 3.0–12.0)
Neutro Abs: 5.8 10*3/uL (ref 1.4–7.7)
Neutrophils Relative %: 72 % (ref 43.0–77.0)
Platelets: 243 10*3/uL (ref 150.0–400.0)
RBC: 5 Mil/uL (ref 3.87–5.11)
RDW: 17 % — ABNORMAL HIGH (ref 11.5–15.5)
WBC: 8 10*3/uL (ref 4.0–10.5)

## 2022-03-23 LAB — BASIC METABOLIC PANEL
BUN: 22 mg/dL (ref 6–23)
CO2: 28 mEq/L (ref 19–32)
Calcium: 10.4 mg/dL (ref 8.4–10.5)
Chloride: 105 mEq/L (ref 96–112)
Creatinine, Ser: 1.15 mg/dL (ref 0.40–1.20)
GFR: 48.22 mL/min — ABNORMAL LOW (ref >60.00)
Glucose, Bld: 80 mg/dL (ref 70–99)
Potassium: 4.3 mEq/L (ref 3.5–5.1)
Sodium: 141 mEq/L (ref 135–145)

## 2022-03-23 LAB — URINALYSIS, ROUTINE W REFLEX MICROSCOPIC
Bilirubin Urine: NEGATIVE
Hgb urine dipstick: NEGATIVE
Ketones, ur: NEGATIVE
Leukocytes,Ua: NEGATIVE
Nitrite: NEGATIVE
RBC / HPF: NONE SEEN (ref 0–?)
Specific Gravity, Urine: 1.015 (ref 1.000–1.030)
Total Protein, Urine: NEGATIVE
Urine Glucose: NEGATIVE
Urobilinogen, UA: 0.2 (ref 0.0–1.0)
pH: 6 (ref 5.0–8.0)

## 2022-03-23 LAB — HEMOGLOBIN A1C: Hgb A1c MFr Bld: 6.5 % (ref 4.6–6.5)

## 2022-03-23 LAB — HEPATIC FUNCTION PANEL
ALT: 11 U/L (ref 0–35)
AST: 16 U/L (ref 0–37)
Albumin: 4.4 g/dL (ref 3.5–5.2)
Alkaline Phosphatase: 76 U/L (ref 39–117)
Bilirubin, Direct: 0.1 mg/dL (ref 0.0–0.3)
Total Bilirubin: 0.3 mg/dL (ref 0.2–1.2)
Total Protein: 7.7 g/dL (ref 6.0–8.3)

## 2022-03-23 LAB — LIPID PANEL
Cholesterol: 160 mg/dL (ref 0–200)
HDL: 59.2 mg/dL (ref >39.00)
LDL Cholesterol: 80 mg/dL (ref 0–99)
NonHDL: 100.84
Total CHOL/HDL Ratio: 3
Triglycerides: 105 mg/dL (ref 0.0–149.0)
VLDL: 21 mg/dL (ref 0.0–40.0)

## 2022-03-23 LAB — VITAMIN D 25 HYDROXY (VIT D DEFICIENCY, FRACTURES): VITD: 38.04 ng/mL (ref 30.00–100.00)

## 2022-03-23 LAB — TSH: TSH: 0.37 u[IU]/mL (ref 0.35–5.50)

## 2022-03-23 LAB — MICROALBUMIN / CREATININE URINE RATIO
Creatinine,U: 58.9 mg/dL
Microalb Creat Ratio: 7 mg/g (ref 0.0–30.0)
Microalb, Ur: 4.1 mg/dL — ABNORMAL HIGH (ref 0.0–1.9)

## 2022-03-23 LAB — VITAMIN B12: Vitamin B-12: 400 pg/mL (ref 211–911)

## 2022-03-25 ENCOUNTER — Encounter: Payer: Self-pay | Admitting: Internal Medicine

## 2022-03-25 NOTE — Assessment & Plan Note (Addendum)
Age and sex appropriate education and counseling updated with regular exercise and diet Referrals for preventative services - pt for mammogram dental appt and eye exam soon, declines colonoscopy Immunizations addressed - declines covid booster, for flu shot Smoking counseling  - none needed Evidence for depression or other mood disorder - none significant Most recent labs reviewed. I have personally reviewed and have noted: 1) the patient's medical and social history 2) The patient's current medications and supplements 3) The patient's height, weight, and BMI have been recorded in the chart

## 2022-03-25 NOTE — Progress Notes (Deleted)
Patient ID: Stacey [name], female   DOB: [birth year], 72 y.o.   MRN: [id number]

## 2022-03-25 NOTE — Assessment & Plan Note (Signed)
Lab Results  Component Value Date   LDLCALC 80 03/22/2022   Stable, pt to continue current statin lipitor 80 mg and tricor 145 mg qd and lower chol diet , declines other change

## 2022-03-25 NOTE — Assessment & Plan Note (Signed)
BP Readings from Last 3 Encounters:  03/22/22 136/78  06/28/21 110/62  05/11/21 136/72   Stable, pt to continue medical treatment norvasc 10 mg qd

## 2022-03-25 NOTE — Assessment & Plan Note (Signed)
Now recurred, for tegretol start, and refer NS Dr Myrle Sheng [phone]

## 2022-03-25 NOTE — Assessment & Plan Note (Signed)
Lab Results  Component Value Date   HGBA1C 6.5 03/22/2022   Stable, pt to continue current medical treatment trulicity 1.5 weekly, glipizide 5 mg qd, actos 45 mg qd

## 2022-03-25 NOTE — Assessment & Plan Note (Signed)
Last vitamin D Lab Results  Component Value Date   VD25OH 38.04 03/22/2022   Low, to start oral replacement

## 2022-03-25 NOTE — Assessment & Plan Note (Signed)
Lab Results  Component Value Date   CREATININE 1.15 03/22/2022   Stable overall, cont to avoid nephrotoxins

## 2022-04-04 ENCOUNTER — Ambulatory Visit: Payer: PPO

## 2022-04-04 DIAGNOSIS — I442 Atrioventricular block, complete: Secondary | ICD-10-CM | POA: Diagnosis not present

## 2022-04-07 LAB — CUP PACEART REMOTE DEVICE CHECK
Battery Remaining Longevity: 27 mo
Battery Voltage: 2.93 V
Brady Statistic AP VP Percent: 24.09 %
Brady Statistic AP VS Percent: 0.02 %
Brady Statistic AS VP Percent: 75.4 %
Brady Statistic AS VS Percent: 0.49 %
Brady Statistic RA Percent Paced: 24.06 %
Brady Statistic RV Percent Paced: 99.33 %
Date Time Interrogation Session: 20240313115957
Implantable Lead Connection Status: 753985
Implantable Lead Connection Status: 753985
Implantable Lead Implant Date: 20160203
Implantable Lead Implant Date: 20160203
Implantable Lead Location: 753859
Implantable Lead Location: 753860
Implantable Lead Model: 5076
Implantable Lead Model: 5076
Implantable Pulse Generator Implant Date: 20160203
Lead Channel Impedance Value: 323 Ohm
Lead Channel Impedance Value: 399 Ohm
Lead Channel Impedance Value: 437 Ohm
Lead Channel Impedance Value: 456 Ohm
Lead Channel Pacing Threshold Amplitude: 0.5 V
Lead Channel Pacing Threshold Amplitude: 1 V
Lead Channel Pacing Threshold Pulse Width: 0.4 ms
Lead Channel Pacing Threshold Pulse Width: 0.4 ms
Lead Channel Sensing Intrinsic Amplitude: 13 mV
Lead Channel Sensing Intrinsic Amplitude: 13 mV
Lead Channel Sensing Intrinsic Amplitude: 2.625 mV
Lead Channel Sensing Intrinsic Amplitude: 2.625 mV
Lead Channel Setting Pacing Amplitude: 2 V
Lead Channel Setting Pacing Amplitude: 2.5 V
Lead Channel Setting Pacing Pulse Width: 0.4 ms
Lead Channel Setting Sensing Sensitivity: 2 mV
Zone Setting Status: 755011
Zone Setting Status: 755011

## 2022-04-23 ENCOUNTER — Other Ambulatory Visit: Payer: Self-pay | Admitting: Internal Medicine

## 2022-04-25 ENCOUNTER — Other Ambulatory Visit: Payer: Self-pay | Admitting: Internal Medicine

## 2022-05-04 ENCOUNTER — Other Ambulatory Visit: Payer: Self-pay

## 2022-05-04 MED ORDER — AMLODIPINE BESYLATE 10 MG PO TABS: ORAL_TABLET | ORAL | 2 refills | Status: DC

## 2022-05-04 MED ORDER — FENOFIBRATE 145 MG PO TABS: 145.0000 mg | ORAL_TABLET | Freq: Every day | ORAL | 2 refills | Status: DC

## 2022-05-09 DIAGNOSIS — G500 Trigeminal neuralgia: Secondary | ICD-10-CM | POA: Diagnosis not present

## 2022-05-11 NOTE — Progress Notes (Signed)
Remote pacemaker transmission.   

## 2022-06-08 ENCOUNTER — Telehealth: Payer: Self-pay

## 2022-06-08 NOTE — Telephone Encounter (Signed)
Contacted Stacey [name] to schedule their annual wellness visit. Appointment made for 06/16/22.  Agnes Lawrence, CMA (AAMA)  [facility] [phone]

## 2022-06-16 ENCOUNTER — Ambulatory Visit (INDEPENDENT_AMBULATORY_CARE_PROVIDER_SITE_OTHER): Payer: PPO

## 2022-06-16 VITALS — Ht 69.0 in | Wt 192.0 lb

## 2022-06-16 DIAGNOSIS — Z0000 Encounter for general adult medical examination without abnormal findings: Secondary | ICD-10-CM | POA: Diagnosis not present

## 2022-06-16 NOTE — Patient Instructions (Addendum)
Stacey [name] , Thank you for taking time to come for your Medicare Wellness Visit. I appreciate your ongoing commitment to your health goals. Please review the following plan we discussed and let me know if I can assist you in the future.   These are the goals we discussed:  Goals      My goal for 2024 is to maintain my health by losing weight, controlling my diabetes and eating healthy.        This is a list of the screening recommended for you and due dates:  Health Maintenance  Topic Date Due   Eye exam for diabetics  06/30/2020   COVID-19 Vaccine (6 - 2023-24 season) 09/24/2021   Mammogram  03/22/2022   Zoster (Shingles) Vaccine (1 of 2) 06/20/2022*   Colon Cancer Screening  03/23/2023*   Flu Shot  08/25/2022   Hemoglobin A1C  09/20/2022   Yearly kidney function blood test for diabetes  03/23/2023   Yearly kidney health urinalysis for diabetes  03/23/2023   Complete foot exam   03/23/2023   Medicare Annual Wellness Visit  06/16/2023   DTaP/Tdap/Td vaccine (4 - Td or Tdap) 04/15/2030   Pneumonia Vaccine  Completed   DEXA scan (bone density measurement)  Completed   Hepatitis C Screening  Completed   HPV Vaccine  Aged Out  *Topic was postponed. The date shown is not the original due date.    Advanced directives: Yes  Conditions/risks identified: Yes; Type II Diabetes  Next appointment: Follow up in one year for your annual wellness visit.   Preventive Care 45 Years and Older, Female Preventive care refers to lifestyle choices and visits with your health care provider that can promote health and wellness. What does preventive care include? A yearly physical exam. This is also called an annual well check. Dental exams once or twice a year. Routine eye exams. Ask your health care provider how often you should have your eyes checked. Personal lifestyle choices, including: Daily care of your teeth and gums. Regular physical activity. Eating a healthy  diet. Avoiding tobacco and drug use. Limiting alcohol use. Practicing safe sex. Taking low-dose aspirin every day. Taking vitamin and mineral supplements as recommended by your health care provider. What happens during an annual well check? The services and screenings done by your health care provider during your annual well check will depend on your age, overall health, lifestyle risk factors, and family history of disease. Counseling  Your health care provider may ask you questions about your: Alcohol use. Tobacco use. Drug use. Emotional well-being. Home and relationship well-being. Sexual activity. Eating habits. History of falls. Memory and ability to understand (cognition). Work and work Astronomer. Reproductive health. Screening  You may have the following tests or measurements: Height, weight, and BMI. Blood pressure. Lipid and cholesterol levels. These may be checked every 5 years, or more frequently if you are over 88 years old. Skin check. Lung cancer screening. You may have this screening every year starting at age 89 if you have a 30-pack-year history of smoking and currently smoke or have quit within the past 15 years. Fecal occult blood test (FOBT) of the stool. You may have this test every year starting at age 55. Flexible sigmoidoscopy or colonoscopy. You may have a sigmoidoscopy every 5 years or a colonoscopy every 10 years starting at age 12. Hepatitis C blood test. Hepatitis B blood test. Sexually transmitted disease (STD) testing. Diabetes screening. This is done by checking your blood sugar (glucose)  after you have not eaten for a while (fasting). You may have this done every 1-3 years. Bone density scan. This is done to screen for osteoporosis. You may have this done starting at age 28. Mammogram. This may be done every 1-2 years. Talk to your health care provider about how often you should have regular mammograms. Talk with your health care provider about  your test results, treatment options, and if necessary, the need for more tests. Vaccines  Your health care provider may recommend certain vaccines, such as: Influenza vaccine. This is recommended every year. Tetanus, diphtheria, and acellular pertussis (Tdap, Td) vaccine. You may need a Td booster every 10 years. Zoster vaccine. You may need this after age 20. Pneumococcal 13-valent conjugate (PCV13) vaccine. One dose is recommended after age 13. Pneumococcal polysaccharide (PPSV23) vaccine. One dose is recommended after age 43. Talk to your health care provider about which screenings and vaccines you need and how often you need them. This information is not intended to replace advice given to you by your health care provider. Make sure you discuss any questions you have with your health care provider. Document Released: 02/06/2015 Document Revised: 09/30/2015 Document Reviewed: 11/11/2014 Elsevier Interactive Patient Education  2017 [employer].  Fall Prevention in the Home Falls can cause injuries. They can happen to people of all ages. There are many things you can do to make your home safe and to help prevent falls. What can I do on the outside of my home? Regularly fix the edges of walkways and driveways and fix any cracks. Remove anything that might make you trip as you walk through a door, such as a raised step or threshold. Trim any bushes or trees on the path to your home. Use bright outdoor lighting. Clear any walking paths of anything that might make someone trip, such as rocks or tools. Regularly check to see if handrails are loose or broken. Make sure that both sides of any steps have handrails. Any raised decks and porches should have guardrails on the edges. Have any leaves, snow, or ice cleared regularly. Use sand or salt on walking paths during winter. Clean up any spills in your garage right away. This includes oil or grease spills. What can I do in the bathroom? Use  night lights. Install grab bars by the toilet and in the tub and shower. Do not use towel bars as grab bars. Use non-skid mats or decals in the tub or shower. If you need to sit down in the shower, use a plastic, non-slip stool. Keep the floor dry. Clean up any water that spills on the floor as soon as it happens. Remove soap buildup in the tub or shower regularly. Attach bath mats securely with double-sided non-slip rug tape. Do not have throw rugs and other things on the floor that can make you trip. What can I do in the bedroom? Use night lights. Make sure that you have a light by your bed that is easy to reach. Do not use any sheets or blankets that are too big for your bed. They should not hang down onto the floor. Have a firm chair that has side arms. You can use this for support while you get dressed. Do not have throw rugs and other things on the floor that can make you trip. What can I do in the kitchen? Clean up any spills right away. Avoid walking on wet floors. Keep items that you use a lot in easy-to-reach places. If  you need to reach something above you, use a strong step stool that has a grab bar. Keep electrical cords out of the way. Do not use floor polish or wax that makes floors slippery. If you must use wax, use non-skid floor wax. Do not have throw rugs and other things on the floor that can make you trip. What can I do with my stairs? Do not leave any items on the stairs. Make sure that there are handrails on both sides of the stairs and use them. Fix handrails that are broken or loose. Make sure that handrails are as long as the stairways. Check any carpeting to make sure that it is firmly attached to the stairs. Fix any carpet that is loose or worn. Avoid having throw rugs at the top or bottom of the stairs. If you do have throw rugs, attach them to the floor with carpet tape. Make sure that you have a light switch at the top of the stairs and the bottom of the  stairs. If you do not have them, ask someone to add them for you. What else can I do to help prevent falls? Wear shoes that: Do not have high heels. Have rubber bottoms. Are comfortable and fit you well. Are closed at the toe. Do not wear sandals. If you use a stepladder: Make sure that it is fully opened. Do not climb a closed stepladder. Make sure that both sides of the stepladder are locked into place. Ask someone to hold it for you, if possible. Clearly mark and make sure that you can see: Any grab bars or handrails. First and last steps. Where the edge of each step is. Use tools that help you move around (mobility aids) if they are needed. These include: Canes. Walkers. Scooters. Crutches. Turn on the lights when you go into a dark area. Replace any light bulbs as soon as they burn out. Set up your furniture so you have a clear path. Avoid moving your furniture around. If any of your floors are uneven, fix them. If there are any pets around you, be aware of where they are. Review your medicines with your doctor. Some medicines can make you feel dizzy. This can increase your chance of falling. Ask your doctor what other things that you can do to help prevent falls. This information is not intended to replace advice given to you by your health care provider. Make sure you discuss any questions you have with your health care provider. Document Released: 11/06/2008 Document Revised: 06/18/2015 Document Reviewed: 02/14/2014 Elsevier Interactive Patient Education  2017 [employer].

## 2022-06-16 NOTE — Progress Notes (Signed)
I connected with  Stacey [name] on 06/16/22 by a audio enabled telemedicine application and verified that I am speaking with the correct person using two identifiers.  Patient Location: Home  Provider Location: Office/Clinic  I discussed the limitations of evaluation and management by telemedicine. The patient expressed understanding and agreed to proceed.  Subjective:   Stacey [name] is a 72 y.o. female who presents for Medicare Annual (Subsequent) preventive examination.  Review of Systems     Cardiac Risk Factors include: advanced age (>3men, >43 women);diabetes mellitus;dyslipidemia;family history of premature cardiovascular disease;hypertension;sedentary lifestyle     Objective:    Today's Vitals   06/16/22 1535  Weight: 192 lb (87.1 kg)  Height: 5\' 9"  (1.753 m)  PainSc: 0-No pain   Body mass index is 28.35 kg/m.     06/16/2022    3:39 PM 08/23/2021    8:59 AM 03/19/2020    2:44 PM 04/08/2019    4:01 PM 07/11/2018   12:56 PM 03/25/2018    3:12 AM 03/25/2018    3:01 AM  Advanced Directives  Does Patient Have a Medical Advance Directive? Yes Yes Yes Yes Yes No No  Type of Estate agent of [place];Living will Healthcare Power of [place];Living will Living will;Healthcare Power of [employer] Power of [place];Living will Healthcare Power of [place];Living will    Does patient want to make changes to medical advance directive?   No - Patient declined No - Patient declined No - Patient declined    Copy of Healthcare Power of Attorney in Chart? No - copy requested No - copy requested No - copy requested      Would patient like information on creating a medical advance directive?      No - Patient declined No - Patient declined    Current Medications (verified) Outpatient Encounter Medications as of 06/16/2022  Medication Sig   acetaminophen (TYLENOL) 500 MG tablet Take 500 mg by mouth daily as needed.   albuterol  (VENTOLIN HFA) 108 (90 Base) MCG/ACT inhaler Inhale 2 puffs into the lungs every 6 (six) hours as needed for wheezing or shortness of breath.   amLODipine (NORVASC) 10 MG tablet TAKE 1 TABLET(10 MG) BY MOUTH DAILY   atorvastatin (LIPITOR) 80 MG tablet Take 1 tablet (80 mg total) by mouth daily.   benzonatate (TESSALON PERLES) 100 MG capsule 1-2 capsules up to twice daily as needed   Blood Glucose Monitoring Suppl (ONE TOUCH ULTRA 2) W/DEVICE KIT Use to check blood sugars daily Dx E11.9   carbamazepine (TEGRETOL-XR) 100 MG 12 hr tablet Take 1 tablet (100 mg total) by mouth 2 (two) times daily.   cetirizine (ZYRTEC) 10 MG tablet Take 10 mg by mouth daily.   clonazePAM (KLONOPIN) 0.5 MG tablet TAKE 1 TABLET BY MOUTH TWICE DAILY AS NEEDED FOR ANXIETY   dicyclomine (BENTYL) 20 MG tablet Take 1 tablet (20 mg total) by mouth 3 (three) times daily between meals as needed for spasms.   Dulaglutide (TRULICITY) 1.5 MG/0.5ML SOPN Inject 1.5 mg into the skin once a week.   fenofibrate (TRICOR) 145 MG tablet Take 1 tablet (145 mg total) by mouth daily.   glipiZIDE (GLUCOTROL XL) 5 MG 24 hr tablet Take 1 tablet (5 mg total) by mouth daily with breakfast.   glucose blood test strip 1 each by Other route daily. Use to check blood sugar daily Dx e11.9   hydrochlorothiazide (MICROZIDE) 12.5 MG capsule Take 1 capsule (12.5 mg total) by mouth daily.  hydrocortisone (ANUSOL-HC) 2.5 % rectal cream Place 1 application rectally as needed for hemorrhoids or anal itching.   losartan (COZAAR) 100 MG tablet TAKE 1 TABLET(100 MG) BY MOUTH DAILY   metoprolol succinate (TOPROL-XL) 100 MG 24 hr tablet TAKE 1 TABLET(100 MG) BY MOUTH DAILY WITH OR IMMEDIATELY FOLLOWING A MEAL   ondansetron (ZOFRAN ODT) 4 MG disintegrating tablet Take 1 tablet (4 mg total) by mouth every 8 (eight) hours as needed for nausea.   ONETOUCH DELICA LANCETS 33G MISC USE TO HELP CHECK BLOOD SUGAR DAILY   oxcarbazepine (TRILEPTAL) 600 MG tablet TAKE 1  TABLET(600 MG) BY MOUTH TWICE DAILY   pantoprazole (PROTONIX) 40 MG tablet TAKE 1 TABLET(40 MG) BY MOUTH DAILY   pioglitazone (ACTOS) 45 MG tablet TAKE 1 TABLET BY MOUTH DAILY   potassium chloride (KLOR-CON M) 10 MEQ tablet TAKE 2 TABLETS BY MOUTH DAILY   prochlorperazine (COMPAZINE) 10 MG tablet Take 1 tablet (10 mg total) by mouth every 6 (six) hours as needed for nausea or vomiting.   topiramate (TOPAMAX) 100 MG tablet TAKE 1 TABLET(100 MG) BY MOUTH DAILY (Patient taking differently: Take 50 mg by mouth 2 (two) times daily. 3 tablets)   No facility-administered encounter medications on file as of 06/16/2022.    Allergies (verified) Dairy aid [tilactase], Metformin and related, Ace inhibitors, Aspirin, Codeine, and Soy allergy   History: Past Medical History:  Diagnosis Date   ANGIOEDEMA 03/07/2008   a. with ACE-I   ANXIETY 09/16/2006   ASTHMA 08/01/2006   CAD (coronary artery disease)    a. 01/2010 : Minimal plaque at cardiac catheterization - CFX 20%, EF 70%;  b. 08/2012 Cath: Nl Cors, EF 65%.   Chronic diastolic CHF (congestive heart failure) (HCC) 04/06/2010   a. In setting of HOCM.   Complete heart block (HCC) 02/26/2014   a. 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 s/p septal myomectomy 4/12 at [facility] with Dr. Silvestre Mesi;  b. echo 5/12: EF 60-65%, LVOT peak 18 mmHg; grade 1 diast dysfxn, mild SAM (improved since myomectomy;  c. 03/2012 Echo: EF 60-65%, mod-sev basal septal asymm hypertrophy, basal septal HK, LVOT grad , Gr 1 DD, , SAM, Mild MR, nl RV, PASP ; 08/2012 Echo: technically difficult, doubt LVOT obstruction, EF 60%, Gr 1 DD, mod-sev dil LA.   Impaired glucose tolerance 06/25/2010    LBBB (left bundle branch block) 06/17/2010   OBESITY 09/16/2006   Renal artery aneurysm (HCC)    SICKLE CELL ANEMIA 08/01/2006   Type II or unspecified type diabetes mellitus without mention of complication, uncontrolled 08/30/2010   VENTRAL HERNIA 12/07/2006   Past Surgical History:  Procedure Laterality Date   ABDOMINAL HYSTERECTOMY  1987   CARDIAC SURGERY     COLONOSCOPY N/A 12/26/2016   Procedure: COLONOSCOPY;  Surgeon: Rachael Fee, MD;  Location: [facility] END[facility]COPY;  Service: Endoscopy;  Laterality: N/A;   ELECTROPHYSIOLOGY STUDY N/A 09/12/2012   Procedure: ELECTROPHYSIOLOGY STUDY;  Surgeon: [name], MD;  Location: [facility] CATH LAB;  Service: Cardiovascular;  Laterality: N/A;   LEFT HEART CATH  Aug. 18, 2014   Medtronic heart device   LEFT HEART CATHETERIZATION WITH CORONARY ANGIOGRAM N/A 09/10/2012   Procedure: LEFT HEART CATHETERIZATION WITH CORONARY ANGIOGRAM;  Surgeon: Kathleene Hazel, MD;  Location: [facility] C[facility] LAB;  Service: Cardiovascular;  Laterality: N/A;   MYOMECTOMY     Septal   PERMANENT PACEMAKER INSERTION N/A 02/26/2014   MDT Advisa dual chamber pacemaker implanted by Dr Graciela Husbands for heart block   VENTRAL HERNIA REPAIR  06/2006   Family History  Adopted: Yes  Problem Relation Age of Onset   Heart disease Father    Heart attack Father    Hypertension Father        before age 11   Heart attack Sister    Hyperlipidemia Sister    Stroke Sister    Hypertension Sister    Hyperlipidemia Brother    Hypertension Brother    Diabetes Paternal Grandmother    Colon cancer Daughter        and bleeding disorder   Hyperlipidemia Daughter    Heart disease Daughter    Clotting disorder Daughter    Hypertension Son    Clotting disorder Other        granddaughter   Social History   Socioeconomic History   Marital status: Widowed    Spouse name: Not on file   Number of children: 3   Years of education: Not on file   Highest education level: Not on file  Occupational History    Occupation: retired Magazine features editor: [employer]  Tobacco Use   Smoking status: Former    Packs/day: 0.25    Years: 20.00    Additional pack years: 0.00    Total pack years: 5.00    Types: Cigarettes    Quit date: 01/24/1998    Years since quitting: 24.4   Smokeless tobacco: Never   Tobacco comments:    Quit smoking 2001. Smoked on and off for 20 years. Smoked 2-3 cigars daily  Vaping Use   Vaping Use: Never used  Substance and Sexual Activity   Alcohol use: No    Alcohol/week: 0.0 standard drinks of alcohol   Drug use: No   Sexual activity: Not Currently  Other Topics Concern   Not on file  Social History Narrative   Lives in [place] with husband.  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   Social Determinants of Health   Financial Resource Strain: Low Risk  (06/16/2022)   Overall Financial Resource Strain (CARDIA)    Difficulty of Paying Living Expenses: Not hard at all  Food Insecurity: No Food Insecurity (06/16/2022)   Hunger Vital Sign    Worried About Running Out of Food in the Last Year: Never true    Ran Out of Food in the Last Year: Never true  Transportation Needs: No Transportation Needs (06/16/2022)   PRAPARE - Administrator, Civil Service (Medical): No    Lack of Transportation (Non-Medical): No  Physical Activity: Inactive (06/16/2022)   Exercise Vital Sign    Days of Exercise per Week: 0 days    Minutes of Exercise per Session: 0 min  Stress: No Stress Concern Present (06/16/2022)   [employer] of Occupational Health - Occupational Stress Questionnaire    Feeling of Stress : Not at all  Social Connections: Moderately Isolated (06/16/2022)   Social Connection and Isolation Panel [NHANES]    Frequency of Communication with Friends and Family: Three times a week    Frequency of Social Gatherings with Friends and Family: Three times a week    Attends Religious Services: Never    Active Member of  Clubs or  Organizations: Yes    Attends Banker Meetings: 1 to 4 times per year    Marital Status: Widowed    Tobacco Counseling Counseling given: Not Answered Tobacco comments: Quit smoking 2001. Smoked on and off for 20 years. Smoked 2-3 cigars daily   Clinical Intake:  Pre-visit preparation completed: Yes  Pain : No/denies pain Pain Score: 0-No pain     BMI - recorded: 28.35 Nutritional Status: BMI 25 -29 Overweight Nutritional Risks: None Diabetes: Yes CBG done?: No Did pt. bring in CBG monitor from home?: No  How often do you need to have someone help you when you read instructions, pamphlets, or other written materials from your doctor or pharmacy?: 1 - Never What is the last grade level you completed in school?: College Graduate  Nutrition Risk Assessment:  Has the patient had any N/V/D within the last 2 months?  No  Does the patient have any non-healing wounds?  No  Has th e patient had any unintentional weight loss or weight gain?  No   Diabetes:  Is the patient diabetic?  Yes  If diabetic, was a CBG obtained today?  No  Did the patient bring in their glucometer from home?  No  How often do you monitor your CBG's? daily.   Financial Strains and Diabetes Management:  Are you having any financial strains with the device, your supplies or your medication? No .  Does the patient want to be seen by Chronic Care Management for management of their diabetes?  No  Would the patient like to be referred to a Nutritionist or for Diabetic Management?  No   Diabetic Exams:  Diabetic Eye Exam: Overdue for diabetic eye exam. Pt has been advised about the importance in completing this exam. Patient advised to call and schedule an eye exam. Diabetic Foot Exam: Completed 03/22/2022   Interpreter Needed?: No  Information entered by :: Carmin Dibartolo N. Daron Stutz, LPN.   Activities of Daily Living    06/16/2022    3:40 PM 08/23/2021    9:04 AM  In your present state of  health, do you have any difficulty performing the following activities:  Hearing? 0 0  Vision? 0 0  Difficulty concentrating or making decisions? 0 0  Walking or climbing stairs? 0 0  Dressing or bathing? 0 0  Doing errands, shopping? 0 0  Preparing Food and eating ? N N  Using the Toilet? N N  In the past six months, have you accidently leaked urine? N N  Do you have problems with loss of bowel control? N N  Managing your Medications? N N  Managing your Finances? N N  Housekeeping or managing your Housekeeping? N N    Patient Care Team: [name], MD as PCP - General (Internal Medicine) [name], MD as PCP - Electrophysiology (Cardiology) [name], MD as Consulting Physician (Cardiology) [name], MD as Consulting Physician (Gastroenterology) Meredith Pel, NP as Nurse Practitioner (Gastroenterology)  Indicate any recent Medical Services you may have received from other than [facility] providers in the past year (date may be approximate).     Assessment:   This is a routine wellness examination for [place].  Hearing/Vision screen Hearing Screening - Comments:: Denies hearing difficulties    Vision Screening - Comments:: Wears rx glasses - up to date with routine eye exams with [facility]   Dietary issues and exercise activities discussed: Current Exercise Habits: The patient does not  participate in regular exercise at present, Exercise limited by: respiratory conditions(s);cardiac condition(s)   Goals Addressed             This Visit's Progress    My goal for 2024 is to maintain my health by losing weight, controlling my diabetes and eating healthy.        Depression Screen    06/16/2022    3:46 PM 03/22/2022    3:15 PM 08/23/2021    9:00 AM 08/23/2021    8:56 AM 05/11/2021    9:39 AM 05/11/2021    9:03 AM 12/08/2020   11:30 AM  PHQ 2/9 Scores  PHQ - 2 Score 0 0 0 0 0 0 0  PHQ- 9 Score 0 0         Fall Risk    06/16/2022    3:40  PM 03/22/2022    3:16 PM 08/23/2021    9:00 AM 08/17/2021    4:10 PM 05/11/2021    9:39 AM  Fall Risk   Falls in the past year? 0 0 0 0 0  Comment    continues to deny new/ recent falls x 12 months; does not use assistive devices   Number falls in past yr: 0 0 0 0 0  Injury with Fall? 0 0 0 0 0  Comment    N/A- no falls reported   Risk for fall due to : No Fall Risks No Fall Risks  No Fall Risks   Follow up Falls prevention discussed Falls evaluation completed;Education provided Falls evaluation completed;Education provided Falls prevention discussed     FALL RISK PREVENTION PERTAINING TO THE HOME:  Any stairs in or around the home? Yes  If so, are there any without handrails? No  Home free of loose throw rugs in walkways, pet beds, electrical cords, etc? Yes  Adequate lighting in your home to reduce risk of falls? Yes   ASSISTIVE DEVICES UTILIZED TO PREVENT FALLS:  Life alert? No  Use of a cane, walker or w/c? No  Grab bars in the bathroom? Yes  Shower chair or bench in shower? Yes  Elevated toilet seat or a handicapped toilet? No   TIMED UP AND GO:  Was the test performed? No . Telephonic Visit   Cognitive Function:        06/16/2022    3:48 PM 08/23/2021    9:05 AM  6CIT Screen  What Year? 0 points 0 points  What month? 0 points 0 points  What time? 0 points 0 points  Count back from 20 0 points 0 points  Months in reverse 0 points 0 points  Repeat phrase 0 points 4 points  Total Score 0 points 4 points    Immunizations Immunization History  Administered Date(s) Administered   Fluad Quad(high Dose 65+) 11/04/2020, 03/22/2022   Influenza Whole 11/10/2008, 10/13/2009   Influenza, High Dose Seasonal PF 10/26/2016, 10/27/2017   Influenza,inj,Quad PF,6+ Mos 10/23/2013   Influenza-Unspecified 12/04/2014, 11/05/2019   PFIZER(Purple Top)SARS-COV-2 Vaccination 04/29/2019, 05/20/2019, 11/19/2019, 05/22/2020   Pfizer Covid-19 Vaccine Bivalent Booster 77yrs & up  10/16/2020   Pneumococcal Conjugate-13 01/10/2017   Pneumococcal Polysaccharide-23 09/29/2005, 08/30/2010, 01/28/2014, 05/05/2020   Td 01/24/1993, 12/10/2008   Tdap 04/14/2020    TDAP status: Up to date  Flu Vaccine status: Up to date  Pneumococcal vaccine status: Up to date  Covid-19 vaccine status: Completed vaccines  Qualifies for Shingles Vaccine? Yes   Zostavax completed No   Shingrix Completed?: No.    Education  has been provided regarding the importance of this vaccine. Patient has been advised to call insurance company to determine out of pocket expense if they have not yet received this vaccine. Advised may also receive vaccine at local pharmacy or Health Dept. Verbalized acceptance and understanding.  Screening Tests Health Maintenance  Topic Date Due   OPHTHALMOLOGY EXAM  06/30/2020   COVID-19 Vaccine (6 - 2023-24 season) 09/24/2021   MAMMOGRAM  03/22/2022   Zoster Vaccines- Shingrix (1 of 2) 06/20/2022 (Originally 08/01/1969)   Colonoscopy  03/23/2023 (Originally 12/26/2021)   INFLUENZA VACCINE  08/25/2022   HEMOGLOBIN A1C  09/20/2022   Diabetic kidney evaluation - eGFR measurement  03/23/2023   Diabetic kidney evaluation - Urine ACR  03/23/2023   FOOT EXAM  03/23/2023   Medicare Annual Wellness (AWV)  06/16/2023   DTaP/Tdap/Td (4 - Td or Tdap) 04/15/2030   Pneumonia Vaccine 31+ Years old  Completed   DEXA SCAN  Completed   Hepatitis C Screening  Completed   HPV VACCINES  Aged Out    Health Maintenance  Health Maintenance Due  Topic Date Due   OPHTHALMOLOGY EXAM  06/30/2020   COVID-19 Vaccine (6 - 2023-24 season) 09/24/2021   MAMMOGRAM  03/22/2022    Colorectal cancer screening: Type of screening: Colonoscopy. Completed 12/26/2016. Repeat every 5 years  Mammogram status: Completed 03/22/2021. Repeat every year  Bone Density status: Completed 08/10/2017. Results reflect: Bone density results: OSTEOPENIA. Repeat every 2-3 years.  Lung Cancer Screening: (Low  Dose CT Chest recommended if Age 45-80 years, 30 pack-year currently smoking OR have quit w/in 15years.) does not qualify.   Lung Cancer Screening Referral: no  Additional Screening:  Hepatitis C Screening: does qualify; Completed 09/06/2021  Vision Screening: Recommended annual ophthalmology exams for early detection of glaucoma and other disorders of the eye. Is the patient up to date with their annual eye exam?  No  Who is the provider or what is the name of the office in which the patient attends annual eye exams? [facility] If pt is not established with a provider, would they like to be referred to a provider to establish care? No .   Dental Screening: Recommended annual dental exams for proper oral hygiene  Community Resource Referral / Chronic Care Management: CRR required this visit?  No   CCM required this visit?  No      Plan:     I have personally reviewed and noted the following in the patient's chart:   Medical and social history Use of alcohol, tobacco or illicit drugs  Current medications and supplements including opioid prescriptions. Patient is not currently taking opioid prescriptions. Functional ability and status Nutritional status Physical activity Advanced directives List of other physicians Hospitalizations, surgeries, and ER visits in previous 12 months Vitals Screenings to include cognitive, depression, and falls Referrals and appointments  In addition, I have reviewed and discussed with patient certain preventive protocols, quality metrics, and best practice recommendations. A written personalized care plan for preventive services as well as general preventive health recommendations were provided to patient.     Mickeal Needy, LPN   2/84/1324   Nurse Notes: Normal cognitive status assessed by direct observation via telephone conversation by this Nurse Health Advisor. No abnormalities found.   Patient has declined any referral or  screenings at this time due to health issues.

## 2022-06-29 DIAGNOSIS — G500 Trigeminal neuralgia: Secondary | ICD-10-CM | POA: Diagnosis not present

## 2022-06-29 DIAGNOSIS — Z79899 Other long term (current) drug therapy: Secondary | ICD-10-CM | POA: Diagnosis not present

## 2022-06-29 DIAGNOSIS — Z9889 Other specified postprocedural states: Secondary | ICD-10-CM | POA: Diagnosis not present

## 2022-06-29 DIAGNOSIS — R519 Headache, unspecified: Secondary | ICD-10-CM | POA: Diagnosis not present

## 2022-06-29 DIAGNOSIS — Z950 Presence of cardiac pacemaker: Secondary | ICD-10-CM | POA: Diagnosis not present

## 2022-06-29 DIAGNOSIS — G549 Nerve root and plexus disorder, unspecified: Secondary | ICD-10-CM | POA: Diagnosis not present

## 2022-07-04 ENCOUNTER — Ambulatory Visit (INDEPENDENT_AMBULATORY_CARE_PROVIDER_SITE_OTHER): Payer: PPO

## 2022-07-04 DIAGNOSIS — I442 Atrioventricular block, complete: Secondary | ICD-10-CM

## 2022-07-06 LAB — CUP PACEART REMOTE DEVICE CHECK
Battery Remaining Longevity: 21 mo
Battery Voltage: 2.92 V
Brady Statistic AP VP Percent: 34.82 %
Brady Statistic AP VS Percent: 0.2 %
Brady Statistic AS VP Percent: 63.51 %
Brady Statistic AS VS Percent: 1.47 %
Brady Statistic RA Percent Paced: 34.93 %
Brady Statistic RV Percent Paced: 98.18 %
Date Time Interrogation Session: 20240612125013
Implantable Lead Connection Status: 753985
Implantable Lead Connection Status: 753985
Implantable Lead Implant Date: 20160203
Implantable Lead Implant Date: 20160203
Implantable Lead Location: 753859
Implantable Lead Location: 753860
Implantable Lead Model: 5076
Implantable Lead Model: 5076
Implantable Pulse Generator Implant Date: 20160203
Lead Channel Impedance Value: 342 Ohm
Lead Channel Impedance Value: 456 Ohm
Lead Channel Impedance Value: 456 Ohm
Lead Channel Impedance Value: 475 Ohm
Lead Channel Pacing Threshold Amplitude: 0.625 V
Lead Channel Pacing Threshold Amplitude: 0.875 V
Lead Channel Pacing Threshold Pulse Width: 0.4 ms
Lead Channel Pacing Threshold Pulse Width: 0.4 ms
Lead Channel Sensing Intrinsic Amplitude: 10.25 mV
Lead Channel Sensing Intrinsic Amplitude: 10.25 mV
Lead Channel Sensing Intrinsic Amplitude: 2.75 mV
Lead Channel Sensing Intrinsic Amplitude: 2.75 mV
Lead Channel Setting Pacing Amplitude: 2 V
Lead Channel Setting Pacing Amplitude: 2.5 V
Lead Channel Setting Pacing Pulse Width: 0.4 ms
Lead Channel Setting Sensing Sensitivity: 2 mV
Zone Setting Status: 755011
Zone Setting Status: 755011

## 2022-07-14 ENCOUNTER — Other Ambulatory Visit: Payer: Self-pay | Admitting: Internal Medicine

## 2022-07-14 ENCOUNTER — Other Ambulatory Visit: Payer: Self-pay

## 2022-07-25 ENCOUNTER — Telehealth: Payer: Self-pay | Admitting: Internal Medicine

## 2022-07-25 MED ORDER — METOPROLOL SUCCINATE ER 100 MG PO TB24: ORAL_TABLET | ORAL | 1 refills | Status: DC

## 2022-07-25 NOTE — Telephone Encounter (Signed)
Sent refill back to [facility].Marland KitchenSantiago Bumpers

## 2022-07-25 NOTE — Telephone Encounter (Signed)
Patient has contacted pharmacy to get medication filled and has taken her last pill today 07/25/2022. She is completely out of the below medication.    Prescription Request  07/25/2022  LOV: 03/22/2022  What is the name of the medication or equipment? metoprolol succinate (TOPROL-XL) 100 MG 24 hr tablet   Have you contacted your pharmacy to request a refill? Yes   Which pharmacy would you like this sent to?  [facility] #[id number] [place], [place] - [address] AT [facility] [address] [place] [address] Phone: [phone] Fax: [phone]    Patient notified that their request is being sent to the clinical staff for review and that they should receive a response within 2 business days.   Please advise at Mobile [phone] (mobile)

## 2022-07-25 NOTE — Progress Notes (Signed)
Remote pacemaker transmission.   

## 2022-08-12 ENCOUNTER — Encounter: Payer: Self-pay | Admitting: *Deleted

## 2022-09-06 ENCOUNTER — Telehealth: Payer: Self-pay | Admitting: Internal Medicine

## 2022-09-06 MED ORDER — OXCARBAZEPINE 600 MG PO TABS: 600.0000 mg | ORAL_TABLET | Freq: Two times a day (BID) | ORAL | 1 refills | Status: DC

## 2022-09-06 NOTE — Telephone Encounter (Signed)
Done erx 

## 2022-09-06 NOTE — Telephone Encounter (Signed)
Patient said she has enough for today, but will be out of medication by tomorrow.   Prescription Request  09/06/2022  LOV: 03/22/2022  What is the name of the medication or equipment?  oxcarbazepine (TRILEPTAL) 600 MG tablet  Have you contacted your pharmacy to request a refill? Yes   Which pharmacy would you like this sent to?  [facility] #[id number] [place], [place] - [address] AT [facility] & [address] [place] [address] Phone: [phone] Fax: [phone]    Patient notified that their request is being sent to the clinical staff for review and that they should receive a response within 2 business days.   Please advise at Mobile [phone] (mobile)

## 2022-09-15 ENCOUNTER — Other Ambulatory Visit: Payer: Self-pay

## 2022-09-15 MED ORDER — HYDROCHLOROTHIAZIDE 12.5 MG PO CAPS: 12.5000 mg | ORAL_CAPSULE | Freq: Every day | ORAL | 1 refills | Status: DC

## 2022-09-22 DIAGNOSIS — G500 Trigeminal neuralgia: Secondary | ICD-10-CM | POA: Diagnosis not present

## 2022-09-28 ENCOUNTER — Other Ambulatory Visit: Payer: Self-pay | Admitting: Pharmacist

## 2022-09-28 NOTE — Progress Notes (Signed)
Pharmacy Quality Measure Review  This patient is appearing on a report for being at risk of failing the adherence measure for diabetes medications this calendar year.   Medication: pioglitazone 45 mg Last fill date: 05/18/22 for 90 day supply  Called pt and she was unsure if she needed a refill on pioglitazone. Had her check medication bottles and she realized she was out of medication. She plans to call pharmacy tomorrow and request a refill.  Adam Phenix, PharmD PGY-1 Pharmacy Resident

## 2022-10-03 ENCOUNTER — Ambulatory Visit (INDEPENDENT_AMBULATORY_CARE_PROVIDER_SITE_OTHER): Payer: PPO

## 2022-10-03 DIAGNOSIS — I442 Atrioventricular block, complete: Secondary | ICD-10-CM | POA: Diagnosis not present

## 2022-10-10 LAB — CUP PACEART REMOTE DEVICE CHECK
Battery Remaining Longevity: 19 mo
Battery Voltage: 2.91 V
Brady Statistic AP VP Percent: 34.66 %
Brady Statistic AP VS Percent: 0.04 %
Brady Statistic AS VP Percent: 64.51 %
Brady Statistic AS VS Percent: 0.78 %
Brady Statistic RA Percent Paced: 34.62 %
Brady Statistic RV Percent Paced: 99.03 %
Date Time Interrogation Session: 20240915154634
Implantable Lead Connection Status: 753985
Implantable Lead Connection Status: 753985
Implantable Lead Implant Date: 20160203
Implantable Lead Implant Date: 20160203
Implantable Lead Location: 753859
Implantable Lead Location: 753860
Implantable Lead Model: 5076
Implantable Lead Model: 5076
Implantable Pulse Generator Implant Date: 20160203
Lead Channel Impedance Value: 342 Ohm
Lead Channel Impedance Value: 456 Ohm
Lead Channel Impedance Value: 494 Ohm
Lead Channel Impedance Value: 513 Ohm
Lead Channel Pacing Threshold Amplitude: 0.5 V
Lead Channel Pacing Threshold Amplitude: 0.875 V
Lead Channel Pacing Threshold Pulse Width: 0.4 ms
Lead Channel Pacing Threshold Pulse Width: 0.4 ms
Lead Channel Sensing Intrinsic Amplitude: 14.875 mV
Lead Channel Sensing Intrinsic Amplitude: 14.875 mV
Lead Channel Sensing Intrinsic Amplitude: 3.75 mV
Lead Channel Sensing Intrinsic Amplitude: 3.75 mV
Lead Channel Setting Pacing Amplitude: 2 V
Lead Channel Setting Pacing Amplitude: 2.5 V
Lead Channel Setting Pacing Pulse Width: 0.4 ms
Lead Channel Setting Sensing Sensitivity: 2 mV
Zone Setting Status: 755011
Zone Setting Status: 755011

## 2022-10-11 DIAGNOSIS — Z510 Encounter for antineoplastic radiation therapy: Secondary | ICD-10-CM | POA: Diagnosis not present

## 2022-10-11 DIAGNOSIS — G500 Trigeminal neuralgia: Secondary | ICD-10-CM | POA: Diagnosis not present

## 2022-10-13 NOTE — Progress Notes (Signed)
Remote pacemaker transmission.   

## 2022-11-02 ENCOUNTER — Telehealth: Payer: Self-pay | Admitting: Internal Medicine

## 2022-11-02 ENCOUNTER — Other Ambulatory Visit: Payer: Self-pay

## 2022-11-02 ENCOUNTER — Other Ambulatory Visit: Payer: Self-pay | Admitting: Internal Medicine

## 2022-11-02 NOTE — Telephone Encounter (Signed)
Refill already sen today.

## 2022-11-02 NOTE — Telephone Encounter (Signed)
Prescription Request  11/02/2022  LOV: 03/22/2022  What is the name of the medication or equipment? losartan (COZAAR) 100 MG tablet   Have you contacted your pharmacy to request a refill? Yes   Which pharmacy would you like this sent to?  [facility] #[id number] [place], [place] - [address] AT [facility] & [address] [place] [address] Phone: [phone] Fax: [phone]    Patient notified that their request is being sent to the clinical staff for review and that they should receive a response within 2 business days.   Please advise at Mobile [phone] (mobile)

## 2022-12-19 ENCOUNTER — Telehealth: Payer: Self-pay

## 2022-12-19 NOTE — Patient Outreach (Signed)
Attempted to contact patient regarding care gaps. Left voicemail for patient to return my call at [phone].  Nicholes Rough, CMA Care Guide [employer]

## 2023-01-02 ENCOUNTER — Ambulatory Visit (INDEPENDENT_AMBULATORY_CARE_PROVIDER_SITE_OTHER): Payer: PPO

## 2023-01-02 DIAGNOSIS — I442 Atrioventricular block, complete: Secondary | ICD-10-CM

## 2023-01-02 DIAGNOSIS — I495 Sick sinus syndrome: Secondary | ICD-10-CM | POA: Diagnosis not present

## 2023-01-07 ENCOUNTER — Other Ambulatory Visit: Payer: Self-pay | Admitting: Internal Medicine

## 2023-01-08 LAB — CUP PACEART REMOTE DEVICE CHECK
Battery Remaining Longevity: 18 mo
Battery Voltage: 2.91 V
Brady Statistic AP VP Percent: 42.17 %
Brady Statistic AP VS Percent: 0.02 %
Brady Statistic AS VP Percent: 57.77 %
Brady Statistic AS VS Percent: 0.04 %
Brady Statistic RA Percent Paced: 42.16 %
Brady Statistic RV Percent Paced: 99.85 %
Date Time Interrogation Session: 20241213214116
Implantable Lead Connection Status: 753985
Implantable Lead Connection Status: 753985
Implantable Lead Implant Date: 20160203
Implantable Lead Implant Date: 20160203
Implantable Lead Location: 753859
Implantable Lead Location: 753860
Implantable Lead Model: 5076
Implantable Lead Model: 5076
Implantable Pulse Generator Implant Date: 20160203
Lead Channel Impedance Value: 342 Ohm
Lead Channel Impedance Value: 399 Ohm
Lead Channel Impedance Value: 437 Ohm
Lead Channel Impedance Value: 475 Ohm
Lead Channel Pacing Threshold Amplitude: 0.625 V
Lead Channel Pacing Threshold Amplitude: 0.875 V
Lead Channel Pacing Threshold Pulse Width: 0.4 ms
Lead Channel Pacing Threshold Pulse Width: 0.4 ms
Lead Channel Sensing Intrinsic Amplitude: 2.625 mV
Lead Channel Sensing Intrinsic Amplitude: 2.625 mV
Lead Channel Sensing Intrinsic Amplitude: 7.625 mV
Lead Channel Sensing Intrinsic Amplitude: 7.625 mV
Lead Channel Setting Pacing Amplitude: 2 V
Lead Channel Setting Pacing Amplitude: 2.5 V
Lead Channel Setting Pacing Pulse Width: 0.4 ms
Lead Channel Setting Sensing Sensitivity: 2 mV
Zone Setting Status: 755011
Zone Setting Status: 755011

## 2023-01-09 ENCOUNTER — Other Ambulatory Visit: Payer: Self-pay

## 2023-01-30 ENCOUNTER — Other Ambulatory Visit: Payer: Self-pay | Admitting: Internal Medicine

## 2023-01-30 ENCOUNTER — Other Ambulatory Visit: Payer: Self-pay

## 2023-02-14 DIAGNOSIS — Z9889 Other specified postprocedural states: Secondary | ICD-10-CM | POA: Diagnosis not present

## 2023-02-14 DIAGNOSIS — Z923 Personal history of irradiation: Secondary | ICD-10-CM | POA: Diagnosis not present

## 2023-02-14 DIAGNOSIS — G500 Trigeminal neuralgia: Secondary | ICD-10-CM | POA: Diagnosis not present

## 2023-02-17 DIAGNOSIS — Z9889 Other specified postprocedural states: Secondary | ICD-10-CM | POA: Insufficient documentation

## 2023-02-24 ENCOUNTER — Encounter (HOSPITAL_BASED_OUTPATIENT_CLINIC_OR_DEPARTMENT_OTHER): Payer: Self-pay

## 2023-03-08 ENCOUNTER — Other Ambulatory Visit: Payer: Self-pay | Admitting: Internal Medicine

## 2023-03-09 ENCOUNTER — Other Ambulatory Visit: Payer: Self-pay

## 2023-03-13 ENCOUNTER — Encounter: Payer: Self-pay | Admitting: Internal Medicine

## 2023-03-13 ENCOUNTER — Telehealth: Payer: Self-pay | Admitting: Internal Medicine

## 2023-03-13 NOTE — Telephone Encounter (Signed)
 Ok this was sent to patient directely per [facility] to contact pt to let her know, but also print and send to her if she is unable to print herself.    thanks

## 2023-03-13 NOTE — Telephone Encounter (Signed)
 Copied from CRM [id number]. Topic: General - Other >> Mar 13, 2023  2:17 PM Sim Boast F wrote: Reason for CRM: Patient needs letter for [facility] duty signed, the letter via Earleen Reaper is not signed

## 2023-03-13 NOTE — Telephone Encounter (Signed)
Ok done hardcopy to cma 

## 2023-03-13 NOTE — Telephone Encounter (Signed)
 Copied from CRM [id number]. Topic: General - Other >> Mar 13, 2023 10:16 AM [place] J wrote: Reason for CRM: Patient has been called for federal jury and would like a doctor's note excusing her and stated she is not physical able.

## 2023-03-14 NOTE — Telephone Encounter (Signed)
 Copied from CRM [id number]. Topic: General - Other >> Mar 14, 2023 12:42 PM [name] wrote: Reason for CRM: Patient needs letter for Stacey [name] duty signed, the letter via [employer] is not signed. Patient states that the letter does not have his signature so she can not use it. She said if he could please sign the letter then resend it back through MyChart.

## 2023-03-15 NOTE — Telephone Encounter (Signed)
 This is not possible to send a signed letter to my knowledge - I do not know how to send attachment to pt messages  I have done hardcopy of the request to CMA, so perhaps you could do this, or have pt pick up in person      thanks

## 2023-03-16 NOTE — Telephone Encounter (Signed)
 Called and let Pt know letter is ready for pickup and will be placed up front.

## 2023-03-19 ENCOUNTER — Other Ambulatory Visit: Payer: Self-pay | Admitting: Internal Medicine

## 2023-03-20 ENCOUNTER — Other Ambulatory Visit: Payer: Self-pay

## 2023-04-05 ENCOUNTER — Other Ambulatory Visit: Payer: Self-pay | Admitting: Internal Medicine

## 2023-04-05 ENCOUNTER — Other Ambulatory Visit: Payer: Self-pay

## 2023-04-24 ENCOUNTER — Other Ambulatory Visit: Payer: Self-pay | Admitting: Internal Medicine

## 2023-04-24 NOTE — Telephone Encounter (Unsigned)
 Copied from CRM [id number]. Topic: Clinical - Medication Refill >> Apr 24, 2023 12:33 PM Adaysia C wrote: Most Recent Primary Care Visit:  Provider: Mickeal Needy  Department: [facility]  Visit Type: MEDICARE AWV, SEQUENTIAL  Date: 06/16/2022  Medication: Dulaglutide (TRULICITY) 1.5 MG/0.5ML SOPN  Has the patient contacted their pharmacy? Yes, patient needs PCP to submit the RX refill because she is no longer using the provider that was previously issuing RX refills for this medication. (Agent: If no, request that the patient contact the pharmacy for the refill. If patient does not wish to contact the pharmacy document the reason why and proceed with request.) (Agent: If yes, when and what did the pharmacy advise?)  Is this the correct pharmacy for this prescription? Yes If no, delete pharmacy and type the correct one.  This is the patient's preferred pharmacy:  [id number] [place], [place] - [address] AT [facility] [address] [place] [address] Phone: [phone] Fax: [phone]   Has the prescription been filled recently? No  Is the patient out of the medication? No, patient has 2 weeks worth of medication left  Has the patient been seen for an appointment in the last year OR does the patient have an upcoming appointment? Yes  Can we respond through MyChart? Yes  Agent: Please be advised that Rx refills may take up to 3 business days. We ask that you follow-up with your pharmacy.

## 2023-04-25 NOTE — Telephone Encounter (Signed)
 Over a year since pt has been seen. Refilled denied, needs appt

## 2023-04-29 ENCOUNTER — Other Ambulatory Visit: Payer: Self-pay | Admitting: Internal Medicine

## 2023-04-30 ENCOUNTER — Other Ambulatory Visit: Payer: Self-pay | Admitting: Internal Medicine

## 2023-05-01 ENCOUNTER — Other Ambulatory Visit: Payer: Self-pay

## 2023-05-01 ENCOUNTER — Other Ambulatory Visit: Payer: Self-pay | Admitting: Internal Medicine

## 2023-05-15 DIAGNOSIS — G500 Trigeminal neuralgia: Secondary | ICD-10-CM | POA: Diagnosis not present

## 2023-05-16 ENCOUNTER — Encounter: Payer: Self-pay | Admitting: Internal Medicine

## 2023-05-16 ENCOUNTER — Telehealth: Admitting: Internal Medicine

## 2023-05-16 DIAGNOSIS — E1165 Type 2 diabetes mellitus with hyperglycemia: Secondary | ICD-10-CM

## 2023-05-16 DIAGNOSIS — E538 Deficiency of other specified B group vitamins: Secondary | ICD-10-CM

## 2023-05-16 DIAGNOSIS — E559 Vitamin D deficiency, unspecified: Secondary | ICD-10-CM

## 2023-05-16 DIAGNOSIS — E875 Hyperkalemia: Secondary | ICD-10-CM | POA: Insufficient documentation

## 2023-05-16 DIAGNOSIS — E7800 Pure hypercholesterolemia, unspecified: Secondary | ICD-10-CM

## 2023-05-16 DIAGNOSIS — Z1211 Encounter for screening for malignant neoplasm of colon: Secondary | ICD-10-CM

## 2023-05-16 DIAGNOSIS — Z0000 Encounter for general adult medical examination without abnormal findings: Secondary | ICD-10-CM | POA: Insufficient documentation

## 2023-05-16 LAB — HEPATIC FUNCTION PANEL
ALT: 9 U/L (ref 0–35)
AST: 11 U/L (ref 0–37)
Albumin: 4.1 g/dL (ref 3.5–5.2)
Alkaline Phosphatase: 66 U/L (ref 39–117)
Bilirubin, Direct: 0.1 mg/dL (ref 0.0–0.3)
Total Bilirubin: 0.3 mg/dL (ref 0.2–1.2)
Total Protein: 7.2 g/dL (ref 6.0–8.3)

## 2023-05-16 LAB — BASIC METABOLIC PANEL WITH GFR
BUN: 28 mg/dL — ABNORMAL HIGH (ref 6–23)
CO2: 27 meq/L (ref 19–32)
Calcium: 9.4 mg/dL (ref 8.4–10.5)
Chloride: 107 meq/L (ref 96–112)
Creatinine, Ser: 1.29 mg/dL — ABNORMAL HIGH (ref 0.40–1.20)
GFR: 41.67 mL/min — ABNORMAL LOW (ref >60.00)
Glucose, Bld: 100 mg/dL — ABNORMAL HIGH (ref 70–99)
Potassium: 4.2 meq/L (ref 3.5–5.1)
Sodium: 141 meq/L (ref 135–145)

## 2023-05-16 LAB — TSH: TSH: 0.34 u[IU]/mL — ABNORMAL LOW (ref 0.35–5.50)

## 2023-05-16 LAB — LIPID PANEL
Cholesterol: 236 mg/dL — ABNORMAL HIGH (ref 0–200)
HDL: 55.1 mg/dL (ref >39.00)
LDL Cholesterol: 122 mg/dL — ABNORMAL HIGH (ref 0–99)
NonHDL: 181.22
Total CHOL/HDL Ratio: 4
Triglycerides: 296 mg/dL — ABNORMAL HIGH (ref 0.0–149.0)
VLDL: 59.2 mg/dL — ABNORMAL HIGH (ref 0.0–40.0)

## 2023-05-16 LAB — MICROALBUMIN / CREATININE URINE RATIO
Creatinine,U: 98.1 mg/dL
Microalb Creat Ratio: 15.1 mg/g (ref 0.0–30.0)
Microalb, Ur: 1.5 mg/dL (ref 0.0–1.9)

## 2023-05-16 MED ORDER — ALBUTEROL SULFATE HFA 108 (90 BASE) MCG/ACT IN AERS: 2.0000 | INHALATION_SPRAY | Freq: Four times a day (QID) | RESPIRATORY_TRACT | 5 refills | Status: AC | PRN

## 2023-05-16 MED ORDER — TOPIRAMATE 100 MG PO TABS: 50.0000 mg | ORAL_TABLET | Freq: Two times a day (BID) | ORAL | 1 refills | Status: AC

## 2023-05-16 MED ORDER — OXCARBAZEPINE 600 MG PO TABS: 600.0000 mg | ORAL_TABLET | Freq: Two times a day (BID) | ORAL | 1 refills | Status: AC

## 2023-05-16 MED ORDER — TRULICITY 1.5 MG/0.5ML ~~LOC~~ SOAJ
1.5000 mg | SUBCUTANEOUS | 3 refills | Status: DC
Start: 2023-05-16 — End: 2023-07-19

## 2023-05-16 MED ORDER — PANTOPRAZOLE SODIUM 40 MG PO TBEC
40.0000 mg | DELAYED_RELEASE_TABLET | Freq: Every day | ORAL | 3 refills | Status: AC
Start: 2023-05-16 — End: ?

## 2023-05-16 MED ORDER — HYDROCHLOROTHIAZIDE 12.5 MG PO CAPS: 12.5000 mg | ORAL_CAPSULE | Freq: Every day | ORAL | 3 refills | Status: AC

## 2023-05-16 MED ORDER — CARBAMAZEPINE ER 100 MG PO TB12: 100.0000 mg | ORAL_TABLET | Freq: Two times a day (BID) | ORAL | 3 refills | Status: AC

## 2023-05-16 MED ORDER — AMLODIPINE BESYLATE 10 MG PO TABS: ORAL_TABLET | ORAL | 3 refills | Status: AC

## 2023-05-16 MED ORDER — ATORVASTATIN CALCIUM 80 MG PO TABS
80.0000 mg | ORAL_TABLET | Freq: Every day | ORAL | 3 refills | Status: AC
Start: 2023-05-16 — End: ?

## 2023-05-16 MED ORDER — LOSARTAN POTASSIUM 100 MG PO TABS: 100.0000 mg | ORAL_TABLET | Freq: Every day | ORAL | 3 refills | Status: AC

## 2023-05-16 MED ORDER — FENOFIBRATE 145 MG PO TABS: 145.0000 mg | ORAL_TABLET | Freq: Every day | ORAL | 3 refills | Status: AC

## 2023-05-16 MED ORDER — METOPROLOL SUCCINATE ER 100 MG PO TB24
ORAL_TABLET | ORAL | 3 refills | Status: AC
Start: 2023-05-16 — End: ?

## 2023-05-16 MED ORDER — PIOGLITAZONE HCL 45 MG PO TABS: 45.0000 mg | ORAL_TABLET | Freq: Every day | ORAL | 3 refills | Status: AC

## 2023-05-16 MED ORDER — GLIPIZIDE ER 5 MG PO TB24: 5.0000 mg | ORAL_TABLET | Freq: Every day | ORAL | 3 refills | Status: AC

## 2023-05-16 MED ORDER — POTASSIUM CHLORIDE CRYS ER 10 MEQ PO TBCR: 20.0000 meq | EXTENDED_RELEASE_TABLET | Freq: Every day | ORAL | 3 refills | Status: AC

## 2023-05-16 NOTE — Progress Notes (Signed)
 Patient ID: Stacey [name], female   DOB: 1950-03-28, 73 y.o.   MRN: [id number]  Virtual Visit via Video Note - Wellness exam  I connected with Stacey [name] on 05/16/23 at  1:40 PM EDT by a video enabled telemedicine application and verified that I am speaking with the correct person using two identifiers.  Location of all participants today Patient: at home Provider: at office   I discussed the limitations of evaluation and management by telemedicine and the availability of in person appointments. The patient expressed understanding and agreed to proceed.  History of Present Illness: Pt denies chest pain, increased sob or doe, wheezing, orthopnea, PND, increased LE swelling, palpitations, dizziness or syncope.   Pt denies polydipsia, polyuria, or new focal neuro s/s.    Pt denies fever, wt loss, night sweats, loss of appetite, or other constitutional symptoms   K per oncology mild elevated at 5.8.  Due for colonoscopy and lab.   Past Medical History:  Diagnosis Date   ANGIOEDEMA 03/07/2008   a. with ACE-I   ANXIETY 09/16/2006   ASTHMA 08/01/2006   CAD (coronary artery disease)    a. 01/2010 : Minimal plaque at cardiac catheterization - CFX 20%, EF 70%;  b. 08/2012 Cath: Nl Cors, EF 65%.   Chronic diastolic CHF (congestive heart failure) (HCC) 04/06/2010   a. In setting of HOCM.   Complete heart block (HCC) 02/26/2014   a. 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 s/p septal myomectomy 4/12 at [facility] with Dr. Minus Amel;  b. echo 5/12: EF 60-65%, LVOT peak 18 mmHg; grade 1 diast dysfxn, mild SAM (improved since myomectomy;  c. 03/2012 Echo: EF 60-65%, mod-sev basal septal asymm  hypertrophy, basal septal HK, LVOT grad , Gr 1 DD, , SAM, Mild MR, nl RV, PASP ; 08/2012 Echo: technically difficult, doubt LVOT obstruction, EF 60%, Gr 1 DD, mod-sev dil LA.   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HCC)    SICKLE CELL ANEMIA 08/01/2006   Type II or unspecified type diabetes mellitus without mention of complication, uncontrolled 08/30/2010   VENTRAL HERNIA 12/07/2006   Past Surgical History:  Procedure Laterality Date   ABDOMINAL HYSTERECTOMY  1987   CARDIAC SURGERY     COLONOSCOPY N/A 12/26/2016   Procedure: COLONOSCOPY;  Surgeon: Janel Medford, MD;  Location: [facility];  Service: Endoscopy;  Laterality: N/A;   ELECTROPHYSIOLOGY STUDY N/A 09/12/2012   Procedure: ELECTROPHYSIOLOGY STUDY;  Surgeon: Verona Goodwill, MD;  Location: [facility] C[facility];  Service: Cardiovascular;  Laterality: N/A;   LEFT HEART CATH  Aug. 18, 2014   Medtronic heart device   LEFT HEART CATHETERIZATION WITH CORONARY ANGIOGRAM N/A 09/10/2012   Procedure: LEFT HEART CATHETERIZATION WITH CORONARY ANGIOGRAM;  Surgeon: Odie Benne, MD;  Location: [facility] CATH LAB;  Service: Cardiovascular;  Laterality: N/A;   MYOMECTOMY     Septal   PERMANENT PACEMAKER INSERTION N/A 02/26/2014   MDT [employer]visa dual chamber pacemaker implanted by Dr Rodolfo Clan for heart block   VENTRAL HERNIA REPAIR  06/2006    reports that she quit smoking about 25 years ago. Her smoking use included cigarettes. She  started smoking about 45 years ago. She has a 5 pack-year smoking history. She has never used smokeless tobacco. She reports that she does not drink alcohol and does not use drugs. family history includes Clotting disorder in her daughter and another family member; Colon cancer in her daughter; Diabetes in her paternal grandmother; Heart attack in her father and sister; Heart disease in her daughter and father; Hyperlipidemia in her brother,  daughter, and sister; Hypertension in her brother, father, sister, and son; Stroke in her sister. She was adopted. Allergies  Allergen Reactions   Dairy Aid [Tilactase] Diarrhea and Other (See Comments)    Bloating and gastric distress   Metformin  And Related Other (See Comments)    GI upset   Ace Inhibitors Other (See Comments)    REACTION: angioedema right eye   Aspirin  Other (See Comments)    Bleeding    Codeine  Other (See Comments)    Hallucinations, can take if with someone.   Soy Allergy (Obsolete) Other (See Comments)    Bleeding & cramps, diarrhea    Current Outpatient Medications on File Prior to Visit  Medication Sig Dispense Refill   acetaminophen  (TYLENOL ) 500 MG tablet Take 500 mg by mouth daily as needed.     cetirizine (ZYRTEC) 10 MG tablet Take 10 mg by mouth daily.     clonazePAM  (KLONOPIN ) 0.5 MG tablet TAKE 1 TABLET BY MOUTH TWICE DAILY AS NEEDED FOR ANXIETY 30 tablet 2   dicyclomine  (BENTYL ) 20 MG tablet Take 1 tablet (20 mg total) by mouth 3 (three) times daily between meals as needed for spasms. 60 tablet 3   No current facility-administered medications on file prior to visit.    Observations/Objective: Alert, NAD, appropriate mood and affect, resps normal, cn 2-12 intact, moves all 4s, no visible rash or swelling Lab Results  Component Value Date   WBC 8.0 03/22/2022   HGB 12.4 03/22/2022   HCT 38.6 03/22/2022   PLT 243.0 03/22/2022   GLUCOSE 80 03/22/2022   CHOL 160 03/22/2022   TRIG 105.0 03/22/2022   HDL 59.20 03/22/2022   LDLDIRECT 98.0 08/10/2018   LDLCALC 80 03/22/2022   ALT 11 03/22/2022   AST 16 03/22/2022   NA 141 03/22/2022   K 4.3 03/22/2022   CL 105 03/22/2022   CREATININE 1.15 03/22/2022   BUN 22 03/22/2022   CO2 28 03/22/2022   TSH 0.37 03/22/2022   INR 1.35 12/23/2016   HGBA1C 6.5 03/22/2022   MICROALBUR 4.1 (H) 03/22/2022   Assessment and Plan: See notes  Follow Up Instructions: See notes   I discussed the assessment  and treatment plan with the patient. The patient was provided an opportunity to ask questions and all were answered. The patient agreed with the plan and demonstrated an understanding of the instructions.   The patient was advised to call back or seek an in-person evaluation if the symptoms worsen or if the condition fails to improve as anticipated.   Rosalia Colonel, MD

## 2023-05-16 NOTE — Assessment & Plan Note (Signed)
Last vitamin D Lab Results  Component Value Date   VD25OH 38.04 03/22/2022   Low, to start oral replacement

## 2023-05-16 NOTE — Assessment & Plan Note (Signed)
 Lab Results  Component Value Date   LDLCALC 80 03/22/2022   Uncontrolled,, pt to continue current statin lipitor 70 mg every day, declines add repatha for now

## 2023-05-16 NOTE — Patient Instructions (Signed)
 Please continue all other medications as before, and refills have been done if requested.  Please have the pharmacy call with any other refills you may need.  Please continue your efforts at being more active, low cholesterol diet, and weight control.  You are otherwise up to date with prevention measures today.  Please keep your appointments with your specialists as you may have planned  You will be contacted regarding the referral for: colonoscopy  Please go to the LAB at the blood drawing area for the tests to be done  You will be contacted by phone if any changes need to be made immediately.  Otherwise, you will receive a letter about your results with an explanation, but please check with MyChart first.

## 2023-05-16 NOTE — Assessment & Plan Note (Signed)
 Age and sex appropriate education and counseling updated with regular exercise and diet Referrals for preventative services - for colonoscopy Immunizations addressed - none needed Smoking counseling  - none needed Evidence for depression or other mood disorder - none significant Most recent labs reviewed. I have personally reviewed and have noted: 1) the patient's medical and social history 2) The patient's current medications and supplements 3) The patient's height, weight, and BMI have been recorded in the chart

## 2023-05-16 NOTE — Assessment & Plan Note (Signed)
 Pt to decrease the potassium 10 meq qd

## 2023-05-16 NOTE — Assessment & Plan Note (Signed)
 Lab Results  Component Value Date   HGBA1C 6.5 03/22/2022   Stable, pt to continue current medical treatment trulicity  1.5 mg weekly,  glucotrol  xl 5qd, actos  45 every day and f/u a1c

## 2023-05-17 ENCOUNTER — Other Ambulatory Visit (HOSPITAL_COMMUNITY): Payer: Self-pay

## 2023-05-17 ENCOUNTER — Encounter: Payer: Self-pay | Admitting: Internal Medicine

## 2023-05-17 ENCOUNTER — Telehealth: Payer: Self-pay | Admitting: Pharmacy Technician

## 2023-05-17 LAB — HEMOGLOBIN A1C: Hgb A1c MFr Bld: 6.3 % (ref 4.6–6.5)

## 2023-05-17 LAB — VITAMIN B12: Vitamin B-12: 305 pg/mL (ref 211–911)

## 2023-05-17 LAB — VITAMIN D 25 HYDROXY (VIT D DEFICIENCY, FRACTURES): VITD: 23.01 ng/mL — ABNORMAL LOW (ref 30.00–100.00)

## 2023-05-17 NOTE — Telephone Encounter (Signed)
 Pharmacy Patient Advocate Encounter  Received notification from [facility] [employer] that Prior Authorization for TRULICITY  1.5MG /0.5ML has been APPROVED from 05/17/2023 to 05/16/2024. Ran test claim, Copay is $0.00/ 84 DAYS. This test claim was processed through [facility]- copay amounts may vary at other pharmacies due to pharmacy/plan contracts, or as the patient moves through the different stages of their insurance plan.   PA #/Case ID/Reference #: [id number]

## 2023-05-17 NOTE — Telephone Encounter (Signed)
 Pharmacy Patient Advocate Encounter   Received notification from Onbase that prior authorization for Trulicity  1.5MG /0.5ML auto-injectors is required/requested.   Insurance verification completed.   The patient is insured through [facility] ADVANTAGE/RX ADVANCE .   Per test claim: PA required; PA submitted to above mentioned insurance via CoverMyMeds Key/confirmation #/[facility] Status is pending

## 2023-05-23 DIAGNOSIS — Z923 Personal history of irradiation: Secondary | ICD-10-CM | POA: Diagnosis not present

## 2023-05-23 DIAGNOSIS — G500 Trigeminal neuralgia: Secondary | ICD-10-CM | POA: Diagnosis not present

## 2023-05-23 DIAGNOSIS — Z9889 Other specified postprocedural states: Secondary | ICD-10-CM | POA: Diagnosis not present

## 2023-06-14 ENCOUNTER — Encounter: Payer: Self-pay | Admitting: Internal Medicine

## 2023-06-20 ENCOUNTER — Ambulatory Visit (INDEPENDENT_AMBULATORY_CARE_PROVIDER_SITE_OTHER): Payer: PPO

## 2023-06-20 ENCOUNTER — Encounter: Payer: Self-pay | Admitting: Internal Medicine

## 2023-06-20 VITALS — Ht 69.0 in | Wt 192.0 lb

## 2023-06-20 DIAGNOSIS — Z1231 Encounter for screening mammogram for malignant neoplasm of breast: Secondary | ICD-10-CM

## 2023-06-20 DIAGNOSIS — E119 Type 2 diabetes mellitus without complications: Secondary | ICD-10-CM | POA: Diagnosis not present

## 2023-06-20 DIAGNOSIS — Z0100 Encounter for examination of eyes and vision without abnormal findings: Secondary | ICD-10-CM

## 2023-06-20 DIAGNOSIS — Z0000 Encounter for general adult medical examination without abnormal findings: Secondary | ICD-10-CM | POA: Diagnosis not present

## 2023-06-20 DIAGNOSIS — Z1211 Encounter for screening for malignant neoplasm of colon: Secondary | ICD-10-CM

## 2023-06-20 NOTE — Progress Notes (Signed)
 Subjective:   Stacey [name] is a 73 y.o. who presents for a Medicare Wellness preventive visit.  As a reminder, Annual Wellness Visits don't include a physical exam, and some assessments may be limited, especially if this visit is performed virtually. We may recommend an in-person follow-up visit with your provider if needed.  Visit Complete: Virtual I connected with  Stacey [name] on 06/20/23 by a audio enabled telemedicine application and verified that I am speaking with the correct person using two identifiers.  Patient Location: Home  Provider Location: Office/Clinic  I discussed the limitations of evaluation and management by telemedicine. The patient expressed understanding and agreed to proceed.  Vital Signs: Because this visit was a virtual/telehealth visit, some criteria may be missing or patient reported. Any vitals not documented were not able to be obtained and vitals that have been documented are patient reported.  VideoDeclined- This patient declined Librarian, academic. Therefore the visit was completed with audio only.  Persons Participating in Visit: Patient.  AWV Questionnaire: Yes: Patient Medicare AWV questionnaire was completed by the patient on 06/16/2023; I have confirmed that all information answered by patient is correct and no changes since this date.  Cardiac Risk Factors include: advanced age (>68men, >12 women);dyslipidemia;diabetes mellitus;hypertension     Objective:     Today's Vitals   06/20/23 1546  Weight: 192 lb (87.1 kg)  Height: 5\' 9"  (1.753 m)   Body mass index is 28.35 kg/m.     06/20/2023    3:44 PM 06/16/2022    3:39 PM 08/23/2021    8:59 AM 03/19/2020    2:44 PM 04/08/2019    4:01 PM 07/11/2018   12:56 PM 03/25/2018    3:12 AM  Advanced Directives  Does Patient Have a Medical Advance Directive? Yes Yes Yes Yes Yes Yes No  Type of Estate agent of  [place];Living will Healthcare Power of [place];Living will Healthcare Power of [place];Living will Living will;Healthcare Power of [employer] Power of [place];Living will Healthcare Power of [place];Living will   Does patient want to make changes to medical advance directive?    No - Patient declined No - Patient declined No - Patient declined   Copy of Healthcare Power of Attorney in Chart? No - copy requested No - copy requested No - copy requested No - copy requested     Would patient like information on creating a medical advance directive?       No - Patient declined    Current Medications (verified) Outpatient Encounter Medications as of 06/20/2023  Medication Sig   acetaminophen  (TYLENOL ) 500 MG tablet Take 500 mg by mouth daily as needed.   albuterol  (VENTOLIN  HFA) 108 (90 Base) MCG/ACT inhaler Inhale 2 puffs into the lungs every 6 (six) hours as needed for wheezing or shortness of breath.   amLODipine  (NORVASC ) 10 MG tablet TAKE 1 TABLET(10 MG) BY MOUTH DAILY   atorvastatin  (LIPITOR) 80 MG tablet Take 1 tablet (80 mg total) by mouth daily.   carbamazepine  (TEGRETOL -XR) 100 MG 12 hr tablet Take 1 tablet (100 mg total) by mouth 2 (two) times daily.   cetirizine (ZYRTEC) 10 MG tablet Take 10 mg by mouth daily.   clonazePAM  (KLONOPIN ) 0.5 MG tablet TAKE 1 TABLET BY MOUTH TWICE DAILY AS NEEDED FOR ANXIETY   dicyclomine  (BENTYL ) 20 MG tablet Take 1 tablet (20 mg total) by mouth 3 (three) times daily between meals as needed for spasms.   Dulaglutide  (TRULICITY ) 1.5  MG/0.5ML SOAJ Inject 1.5 mg into the skin once a week.   fenofibrate  (TRICOR ) 145 MG tablet Take 1 tablet (145 mg total) by mouth daily.   glipiZIDE  (GLUCOTROL  XL) 5 MG 24 hr tablet Take 1 tablet (5 mg total) by mouth daily with breakfast.   hydrochlorothiazide  (MICROZIDE ) 12.5 MG capsule Take 1 capsule (12.5 mg total) by mouth daily.   losartan  (COZAAR ) 100 MG tablet Take 1 tablet (100 mg total) by mouth daily.    metoprolol  succinate (TOPROL -XL) 100 MG 24 hr tablet TAKE 1 TABLET(100 MG) BY MOUTH DAILY WITH OR IMMEDIATELY FOLLOWING A MEAL   oxcarbazepine  (TRILEPTAL ) 600 MG tablet Take 1 tablet (600 mg total) by mouth 2 (two) times daily.   pantoprazole  (PROTONIX ) 40 MG tablet Take 1 tablet (40 mg total) by mouth daily.   pioglitazone  (ACTOS ) 45 MG tablet Take 1 tablet (45 mg total) by mouth daily.   potassium chloride  (KLOR-CON  M) 10 MEQ tablet Take 2 tablets (20 mEq total) by mouth daily.   topiramate  (TOPAMAX ) 100 MG tablet Take 0.5 tablets (50 mg total) by mouth 2 (two) times daily. 3 tablets   No facility-administered encounter medications on file as of 06/20/2023.    Allergies (verified) Dairy aid [tilactase], Metformin  and related, Ace inhibitors, Aspirin , Codeine , and Soy allergy (obsolete)   History: Past Medical History:  Diagnosis Date   ANGIOEDEMA 03/07/2008   a. with ACE-I   ANXIETY 09/16/2006   ASTHMA 08/01/2006   CAD (coronary artery disease)    a. 01/2010 : Minimal plaque at cardiac catheterization - CFX 20%, EF 70%;  b. 08/2012 Cath: Nl Cors, EF 65%.   Chronic diastolic CHF (congestive heart failure) (HCC) 04/06/2010   a. In setting of HOCM.   Complete heart block (HCC) 02/26/2014   a. 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 s/p septal myomectomy 4/12 at [facility] with Dr. Minus Amel;  b. echo 5/12: EF 60-65%, LVOT peak 18 mmHg; grade 1 diast dysfxn, mild SAM (improved since myomectomy;  c. 03/2012 Echo: EF 60-65%, mod-sev basal septal asymm hypertrophy, basal septal HK, LVOT grad , Gr 1 DD, , SAM, Mild MR, nl RV, PASP ; 08/2012 Echo: technically difficult, doubt LVOT obstruction, EF 60%, Gr 1  DD, mod-sev dil LA.   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HCC)    SICKLE CELL ANEMIA 08/01/2006   Type II or unspecified type diabetes mellitus without mention of complication, uncontrolled 08/30/2010   VENTRAL HERNIA 12/07/2006   Past Surgical History:  Procedure Laterality Date   ABDOMINAL HYSTERECTOMY  1987   CARDIAC SURGERY     COLONOSCOPY N/A 12/26/2016   Procedure: COLONOSCOPY;  Surgeon: Janel Medford, MD;  Location: [facility] ENDOSCOPY;  Service: Endoscopy;  Laterality: N/A;   ELECTROPHYSIOLOGY STUDY N/A 09/12/2012   Procedure: ELECTROPHYSIOLOGY STUDY;  Surgeon: Verona Goodwill, MD;  Location: [facility] CATH LAB;  Service: Cardiovascular;  Laterality: N/A;   LEFT HEART CATH  Aug. 18, 2014   Medtronic heart device   LEFT HEART CATHETERIZATION WITH CORONARY ANGIOGRAM N/A 09/10/2012   Procedure: LEFT HEART CATHETERIZATION WITH CORONARY ANGIOGRAM;  Surgeon: Odie Benne, MD;  Location: [facility]  CATH LAB;  Service: Cardiovascular;  Laterality: N/A;   MYOMECTOMY     Septal   PERMANENT PACEMAKER INSERTION N/A 02/26/2014   MDT Advisa dual chamber pacemaker implanted by Dr Rodolfo Clan for heart block   VENTRAL HERNIA REPAIR  06/2006   Family History  Adopted: Yes  Problem Relation Age of Onset   Heart disease Father    Heart attack Father    Hypertension Father        before age 72   Heart attack Sister    Hyperlipidemia Sister    Stroke Sister    Hypertension Sister    Hyperlipidemia Brother    Hypertension Brother    Diabetes Paternal Grandmother    Colon cancer Daughter        and bleeding disorder   Hyperlipidemia Daughter    Heart disease Daughter    Clotting disorder Daughter    Hypertension Son    Clotting disorder Other        granddaughter   Social History   Socioeconomic History   Marital status: Widowed    Spouse name: Not on file   Number of children: 3   Years of education: Not on file    Highest education level: Master's degree (e.g., MA, MS, MEng, MEd, MSW, MBA)  Occupational History   Occupation: retired Magazine features editor: [employer]  Tobacco Use   Smoking status: Former    Current packs/day: 0.00    Average packs/day: 0.3 packs/day for 20.0 years (5.0 ttl pk-yrs)    Types: Cigarettes    Start date: 01/24/1978    Quit date: 01/24/1998    Years since quitting: 25.4    Passive exposure: Past   Smokeless tobacco: Never   Tobacco comments:    Quit smoking 2001. Smoked on and off for 20 years. Smoked 2-3 cigars daily  Vaping Use   Vaping status: Never Used  Substance and Sexual Activity   Alcohol use: No    Alcohol/week: 0.0 standard drinks of alcohol   Drug use: No   Sexual activity: Not Currently  Other Topics Concern   Not on file  Social History Narrative   Lives in [place] with husband.  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   Social Drivers of Corporate investment banker Strain: Low Risk  (06/20/2023)   Overall Financial Resource Strain (CARDIA)    Difficulty of Paying Living Expenses: Not very hard  Food Insecurity: No Food Insecurity (06/20/2023)   Hunger Vital Sign    Worried About Running Out of Food in the Last Year: Never true    Ran Out of Food in the Last Year: Never true  Transportation Needs: Unknown (06/20/2023)   PRAPARE - Transportation    Lack of Transportation (Medical): No    Lack of Transportation (Non-Medical): Patient declined  Physical Activity: Sufficiently Active (06/20/2023)   Exercise Vital Sign    Days of Exercise per Week: 3 days    Minutes of Exercise per Session: 60 min  Recent Concern: Physical Activity - Insufficiently Active (06/16/2023)   Exercise Vital Sign    Days of Exercise per Week: 1 day    Minutes of Exercise per Session: 60 min  Stress: No Stress Concern Present (06/20/2023)   [employer] of Occupational Health - Occupational Stress Questionnaire    Feeling of Stress : Not at  all  Social Connections: Socially Isolated (06/20/2023)   Social Connection and Isolation Panel [NHANES]  Frequency of Communication with Friends and Family: More than three times a week    Frequency of Social Gatherings with Friends and Family: More than three times a week    Attends Religious Services: Never    Database administrator or Organizations: No    Attends Banker Meetings: Never    Marital Status: Widowed    Tobacco Counseling Counseling given: No Tobacco comments: Quit smoking 2001. Smoked on and off for 20 years. Smoked 2-3 cigars daily    Clinical Intake:  Pre-visit preparation completed: Yes  Pain : No/denies pain     BMI - recorded: 28.35 Nutritional Risks: None Diabetes: Yes CBG done?: Yes CBG resulted in Enter/ Edit results?: Yes (fasting - 90) Did pt. bring in CBG monitor from home?: No  Lab Results  Component Value Date   HGBA1C 6.3 05/16/2023   HGBA1C 6.5 03/22/2022   HGBA1C 6.5 05/11/2021     How often do you need to have someone help you when you read instructions, pamphlets, or other written materials from your doctor or pharmacy?: 1 - Never  Interpreter Needed?: No  Information entered by :: Kandy Orris, CMA   Activities of Daily Living     06/20/2023    3:53 PM 06/16/2023    7:00 PM  In your present state of health, do you have any difficulty performing the following activities:  Hearing? 0 0  Vision? 0 0  Difficulty concentrating or making decisions? 0 0  Walking or climbing stairs? 1 1  Comment per Cardiologist stated to not climb steep and many stairs   Dressing or bathing? 0 0  Doing errands, shopping? 0 0  Preparing Food and eating ? N N  Using the Toilet? N N  In the past six months, have you accidently leaked urine? N N  Do you have problems with loss of bowel control? N N  Managing your Medications? N N  Managing your Finances? N N  Housekeeping or managing your Housekeeping? N N    Patient Care  Team: Roslyn Coombe, MD as PCP - General (Internal Medicine) Verona Goodwill, MD as PCP - Electrophysiology (Cardiology) Verona Goodwill, MD as Consulting Physician (Cardiology) Kenney Peacemaker, MD as Consulting Physician (Gastroenterology) Arlee Bellows, NP as Nurse Practitioner (Gastroenterology)  Indicate any recent Medical Services you may have received from other than [facility] providers in the past year (date may be approximate).     Assessment:    This is a routine wellness examination for [place].  Hearing/Vision screen Hearing Screening - Comments:: Denies hearing difficulties   Vision Screening - Comments:: Wears rx glasses - Referral sent to Dr Juanito Norma   Goals Addressed               This Visit's Progress     Patient Stated (pt-stated)        Patient stated she's wanting to lose weight - lose about 20lbs       Depression Screen     06/20/2023    3:54 PM 06/16/2022    3:46 PM 03/22/2022    3:15 PM 08/23/2021    9:00 AM 08/23/2021    8:56 AM 05/11/2021    9:39 AM 05/11/2021    9:03 AM  PHQ 2/9 Scores  PHQ - 2 Score 0 0 0 0 0 0 0  PHQ- 9 Score 0 0 0        Fall Risk     06/20/2023  3:54 PM 06/16/2023    7:00 PM 06/16/2022    3:40 PM 03/22/2022    3:16 PM 08/23/2021    9:00 AM  Fall Risk   Falls in the past year? 0 0 0 0 0  Number falls in past yr: 0  0 0 0  Injury with Fall? 0  0 0 0  Risk for fall due to : No Fall Risks  No Fall Risks No Fall Risks   Follow up Falls evaluation completed;Falls prevention discussed  Falls prevention discussed Falls evaluation completed;Education provided Falls evaluation completed;Education provided    MEDICARE RISK AT HOME:  Medicare Risk at Home Any stairs in or around the home?: Yes If so, are there any without handrails?: No Home free of loose throw rugs in walkways, pet beds, electrical cords, etc?: Yes Adequate lighting in your home to reduce risk of falls?: Yes Life alert?: No Use of a cane, walker or w/c?:  No Grab bars in the bathroom?: No Shower chair or bench in shower?: No Elevated toilet seat or a handicapped toilet?: No  TIMED UP AND GO:  Was the test performed?  No  Cognitive Function: 6CIT completed        06/20/2023    3:59 PM 06/16/2022    3:48 PM 08/23/2021    9:05 AM  6CIT Screen  What Year? 0 points 0 points 0 points  What month? 0 points 0 points 0 points  What time? 0 points 0 points 0 points  Count back from 20 0 points 0 points 0 points  Months in reverse 0 points 0 points 0 points  Repeat phrase 0 points 0 points 4 points  Total Score 0 points 0 points 4 points    Immunizations Immunization History  Administered Date(s) Administered   Fluad Quad(high Dose 65+) 11/04/2020, 03/22/2022   Influenza Whole 11/10/2008, 10/13/2009   Influenza, High Dose Seasonal PF 10/26/2016, 10/27/2017   Influenza,inj,Quad PF,6+ Mos 10/23/2013   Influenza-Unspecified 12/04/2014, 11/05/2019   PFIZER(Purple Top)SARS-COV-2 Vaccination 04/29/2019, 05/20/2019, 11/19/2019, 05/22/2020   Pfizer Covid-19 Vaccine Bivalent Booster 52yrs & up 10/16/2020   Pneumococcal Conjugate-13 01/10/2017   Pneumococcal Polysaccharide-23 09/29/2005, 08/30/2010, 01/28/2014, 05/05/2020   Td 01/24/1993, 12/10/2008   Tdap 04/14/2020    Screening Tests Health Maintenance  Topic Date Due   Zoster Vaccines- Shingrix (1 of 2) Never done   OPHTHALMOLOGY EXAM  06/30/2020   Colonoscopy  12/26/2021   MAMMOGRAM  03/22/2022   FOOT EXAM  03/23/2023   INFLUENZA VACCINE  08/25/2023   HEMOGLOBIN A1C  11/15/2023   Diabetic kidney evaluation - eGFR measurement  05/15/2024   Diabetic kidney evaluation - Urine ACR  05/15/2024   Medicare Annual Wellness (AWV)  06/19/2024   DTaP/Tdap/Td (4 - Td or Tdap) 04/15/2030   Pneumonia Vaccine 65+ Years old  Completed   DEXA SCAN  Completed   Hepatitis C Screening  Completed   HPV VACCINES  Aged Out   Meningococcal B Vaccine  Aged Out   COVID-19 Vaccine  Discontinued     Health Maintenance  Health Maintenance Due  Topic Date Due   Zoster Vaccines- Shingrix (1 of 2) Never done   OPHTHALMOLOGY EXAM  06/30/2020   Colonoscopy  12/26/2021   MAMMOGRAM  03/22/2022   FOOT EXAM  03/23/2023   Health Maintenance Items Addressed:  Mammogram ordered, Referral sent to GI for colonoscopy, Referral sent to Optometry/Ophthalmology   Additional Screening:  Vision Screening: Recommended annual ophthalmology exams for early detection of glaucoma and other  disorders of the eye.  Dental Screening: Recommended annual dental exams for proper oral hygiene  Community Resource Referral / Chronic Care Management: CRR required this visit?  No   CCM required this visit?  No   Plan:    I have personally reviewed and noted the following in the patient's chart:   Medical and social history Use of alcohol, tobacco or illicit drugs  Current medications and supplements including opioid prescriptions. Patient is not currently taking opioid prescriptions. Functional ability and status Nutritional status Physical activity Advanced directives List of other physicians Hospitalizations, surgeries, and ER visits in previous 12 months Vitals Screenings to include cognitive, depression, and falls Referrals and appointments  In addition, I have reviewed and discussed with patient certain preventive protocols, quality metrics, and best practice recommendations. A written personalized care plan for preventive services as well as general preventive health recommendations were provided to patient.   Patria Bookbinder, CMA   06/20/2023   After Visit Summary: (MyChart) Due to this being a telephonic visit, the after visit summary with patients personalized plan was offered to patient via MyChart   Notes: Nothing significant to report at this time.

## 2023-06-20 NOTE — Patient Instructions (Addendum)
 Stacey [name] , Thank you for taking time out of your busy schedule to complete your Annual Wellness Visit with me. I enjoyed our conversation and look forward to speaking with you again next year. I, as well as your care team,  appreciate your ongoing commitment to your health goals. Please review the following plan we discussed and let me know if I can assist you in the future. Your Game plan/ To Do List    Referrals: If you haven't heard from the office you've been referred to, please reach out to them at the phone provided.  Referral to Dr Dema Filler (Ophthalmologist); Referral for a Screening Mammogram; Referral to [facility] (Dr Willy Harvest) for a Colonoscopy Follow up Visits: Next Medicare AWV with our clinical staff: 06/21/2024   Have you seen your provider in the last 6 months (3 months if uncontrolled diabetes)? No Next Office Visit with your provider: 07/19/2023  Clinician Recommendations:  Aim for 30 minutes of exercise or brisk walking, 6-8 glasses of water, and 5 servings of fruits and vegetables each day.       This is a list of the screening recommended for you and due dates:  Health Maintenance  Topic Date Due   Zoster (Shingles) Vaccine (1 of 2) Never done   Eye exam for diabetics  06/30/2020   Colon Cancer Screening  12/26/2021   Mammogram  03/22/2022   Complete foot exam   03/23/2023   Flu Shot  08/25/2023   Hemoglobin A1C  11/15/2023   Yearly kidney function blood test for diabetes  05/15/2024   Yearly kidney health urinalysis for diabetes  05/15/2024   Medicare Annual Wellness Visit  06/19/2024   DTaP/Tdap/Td vaccine (4 - Td or Tdap) 04/15/2030   Pneumonia Vaccine  Completed   DEXA scan (bone density measurement)  Completed   Hepatitis C Screening  Completed   HPV Vaccine  Aged Out   Meningitis B Vaccine  Aged Out   COVID-19 Vaccine  Discontinued    Advanced directives: (Copy Requested) Please bring a copy of your health care power of attorney and  living will to the office to be added to your chart at your convenience. You can mail to [facility] [address] [place], [place] [address] or email to [email or web address] Advance Care Planning is important because it:  [x]  Makes sure you receive the medical care that is consistent with your values, goals, and preferences  [x]  It provides guidance to your family and loved ones and reduces their decisional burden about whether or not they are making the right decisions based on your wishes.  Follow the link provided in your after visit summary or read over the paperwork we have mailed to you to help you started getting your Advance Directives in place. If you need assistance in completing these, please reach out to us  so that we can help you!

## 2023-07-03 ENCOUNTER — Ambulatory Visit (INDEPENDENT_AMBULATORY_CARE_PROVIDER_SITE_OTHER): Payer: HMO

## 2023-07-03 DIAGNOSIS — I495 Sick sinus syndrome: Secondary | ICD-10-CM | POA: Diagnosis not present

## 2023-07-03 DIAGNOSIS — I442 Atrioventricular block, complete: Secondary | ICD-10-CM

## 2023-07-04 LAB — CUP PACEART REMOTE DEVICE CHECK
Battery Remaining Longevity: 12 mo
Battery Voltage: 2.88 V
Brady Statistic AP VP Percent: 44.71 %
Brady Statistic AP VS Percent: 0.01 %
Brady Statistic AS VP Percent: 55.23 %
Brady Statistic AS VS Percent: 0.06 %
Brady Statistic RA Percent Paced: 44.59 %
Brady Statistic RV Percent Paced: 99.66 %
Date Time Interrogation Session: 20250609090718
Implantable Lead Connection Status: 753985
Implantable Lead Connection Status: 753985
Implantable Lead Implant Date: 20160203
Implantable Lead Implant Date: 20160203
Implantable Lead Location: 753859
Implantable Lead Location: 753860
Implantable Lead Model: 5076
Implantable Lead Model: 5076
Implantable Pulse Generator Implant Date: 20160203
Lead Channel Impedance Value: 342 Ohm
Lead Channel Impedance Value: 399 Ohm
Lead Channel Impedance Value: 418 Ohm
Lead Channel Impedance Value: 456 Ohm
Lead Channel Pacing Threshold Amplitude: 0.5 V
Lead Channel Pacing Threshold Amplitude: 1 V
Lead Channel Pacing Threshold Pulse Width: 0.4 ms
Lead Channel Pacing Threshold Pulse Width: 0.4 ms
Lead Channel Sensing Intrinsic Amplitude: 13.5 mV
Lead Channel Sensing Intrinsic Amplitude: 13.5 mV
Lead Channel Sensing Intrinsic Amplitude: 2.625 mV
Lead Channel Sensing Intrinsic Amplitude: 2.625 mV
Lead Channel Setting Pacing Amplitude: 2 V
Lead Channel Setting Pacing Amplitude: 2.5 V
Lead Channel Setting Pacing Pulse Width: 0.4 ms
Lead Channel Setting Sensing Sensitivity: 2 mV
Zone Setting Status: 755011
Zone Setting Status: 755011

## 2023-07-11 ENCOUNTER — Ambulatory Visit
Admission: RE | Admit: 2023-07-11 | Discharge: 2023-07-11 | Disposition: A | Discharge status: Home / Self Care | Source: Ambulatory Visit | Attending: Internal Medicine | Admitting: Internal Medicine

## 2023-07-11 DIAGNOSIS — Z1231 Encounter for screening mammogram for malignant neoplasm of breast: Secondary | ICD-10-CM

## 2023-07-19 ENCOUNTER — Encounter: Payer: Self-pay | Admitting: Internal Medicine

## 2023-07-19 ENCOUNTER — Ambulatory Visit (INDEPENDENT_AMBULATORY_CARE_PROVIDER_SITE_OTHER): Admitting: Internal Medicine

## 2023-07-19 VITALS — BP 124/78 | HR 65 | Temp 98.1°F | Ht 69.0 in | Wt 195.0 lb

## 2023-07-19 DIAGNOSIS — I10 Essential (primary) hypertension: Secondary | ICD-10-CM | POA: Diagnosis not present

## 2023-07-19 DIAGNOSIS — E7800 Pure hypercholesterolemia, unspecified: Secondary | ICD-10-CM

## 2023-07-19 DIAGNOSIS — E559 Vitamin D deficiency, unspecified: Secondary | ICD-10-CM

## 2023-07-19 DIAGNOSIS — N1831 Chronic kidney disease, stage 3a: Secondary | ICD-10-CM

## 2023-07-19 DIAGNOSIS — E1165 Type 2 diabetes mellitus with hyperglycemia: Secondary | ICD-10-CM

## 2023-07-19 MED ORDER — TRULICITY 1.5 MG/0.5ML ~~LOC~~ SOAJ: 1.5000 mg | SUBCUTANEOUS | 3 refills | Status: AC

## 2023-07-19 NOTE — Progress Notes (Signed)
 Chief Complaint: follow up HTN, HLD and DM, low vit d       HPI:  Stacey [name] is a 73 y.o. female here overall doing ok, with improved anxiety trying to wean down meds.  Denies worsening depressive symptoms, suicidal ideation, or panic  Pt denies chest pain, increased sob or doe, wheezing, orthopnea, PND, increased LE swelling, palpitations, dizziness or syncope.   Pt denies polydipsia, polyuria, or new focal neuro s/s.    Pt denies fever, wt loss, night sweats, loss of appetite, or other constitutional symptoms  Due for shingrix at pharmacy  Has been out of statin recent weeks.       Wt Readings from Last 3 Encounters:  07/19/23 195 lb (88.5 kg)  06/20/23 192 lb (87.1 kg)  06/16/22 192 lb (87.1 kg)   BP Readings from Last 3 Encounters:  07/19/23 124/78  03/22/22 136/78  06/28/21 110/62         Past Medical History:  Diagnosis Date   ANGIOEDEMA 03/07/2008   a. with ACE-I   ANXIETY 09/16/2006   ASTHMA 08/01/2006   CAD (coronary artery disease)    a. 01/2010 : Minimal plaque at cardiac catheterization - CFX 20%, EF 70%;  b. 08/2012 Cath: Nl Cors, EF 65%.   Chronic diastolic CHF (congestive heart failure) (HCC) 04/06/2010   a. In setting of HOCM.   Complete heart block (HCC) 02/26/2014   a. 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 s/p septal myomectomy 4/12 at [facility] with Dr. Alford;  b. echo 5/12: EF 60-65%, LVOT peak 18 mmHg; grade 1 diast dysfxn, mild SAM (improved since myomectomy;  c. 03/2012 Echo: EF 60-65%, mod-sev basal septal asymm hypertrophy, basal septal HK, LVOT grad , Gr 1 DD, , SAM, Mild MR, nl RV, PASP ; 08/2012 Echo: technically difficult, doubt LVOT  obstruction, EF 60%, Gr 1 DD, mod-sev dil LA.   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HCC)    SICKLE CELL ANEMIA 08/01/2006   Type II or unspecified type diabetes mellitus without mention of complication, uncontrolled 08/30/2010   VENTRAL HERNIA 12/07/2006   Past Surgical History:  Procedure Laterality Date   ABDOMINAL HYSTERECTOMY  1987   CARDIAC SURGERY     COLONOSCOPY N/A 12/26/2016   Procedure: COLONOSCOPY;  Surgeon: Teressa Toribio SQUIBB, MD;  Location: [facility];  Service: Endoscopy;  Laterality: N/A;   ELECTROPHYSIOLOGY STUDY N/A 09/12/2012   Procedure: ELECTROPHYSIOLOGY STUDY;  Surgeon: Elspeth JAYSON Sage, MD;  Location: [facility] CATH LAB;  Service: Cardiovascular;  Laterality: N/A;   LEFT HEART CATH  Aug. 18, 2014   Medtronic heart device   LEFT HEART CATHETERIZATION WITH CORONARY ANGIOGRAM N/A 09/10/2012   Procedure: LEFT HEART CATHETERIZATION WITH CORONARY ANGIOGRAM;  Surgeon: Lonni JONETTA Cash, MD;  Location: [facility] C[facility] LAB;  Service: Cardiovascular;  Laterality: N/A;   MYOMECTOMY     Septal   PERMANENT PACEMAKER INSERTION N/A 02/26/2014   MDT Advisa dual chamber pacemaker implanted by Dr Sage for heart block   VENTRAL HERNIA REPAIR  06/2006    reports that she quit smoking about 25 years ago. Her smoking  use included cigarettes. She started smoking about 45 years ago. She has a 5 pack-year smoking history. She has been exposed to tobacco smoke. She has never used smokeless tobacco. She reports that she does not drink alcohol and does not use drugs. family history includes Clotting disorder in her daughter and another family member; Colon cancer in her daughter; Diabetes in her paternal grandmother; Heart attack in her father and sister; Heart disease in her daughter and father; Hyperlipidemia in her brother, daughter, and sister; Hypertension in her brother, father, sister, and son; Stroke in her sister. She  was adopted. Allergies  Allergen Reactions   Dairy Aid [Tilactase] Diarrhea and Other (See Comments)    Bloating and gastric distress   Metformin  And Related Other (See Comments)    GI upset   Ace Inhibitors Other (See Comments)    REACTION: angioedema right eye   Aspirin  Other (See Comments)    Bleeding    Codeine  Other (See Comments)    Hallucinations, can take if with someone.   Soy Allergy (Obsolete) Other (See Comments)    Bleeding & cramps, diarrhea    Current Outpatient Medications on File Prior to Visit  Medication Sig Dispense Refill   acetaminophen  (TYLENOL ) 500 MG tablet Take 500 mg by mouth daily as needed.     albuterol  (VENTOLIN  HFA) 108 (90 Base) MCG/ACT inhaler Inhale 2 puffs into the lungs every 6 (six) hours as needed for wheezing or shortness of breath. 18 g 5   amLODipine  (NORVASC ) 10 MG tablet TAKE 1 TABLET(10 MG) BY MOUTH DAILY 90 tablet 3   atorvastatin  (LIPITOR) 80 MG tablet Take 1 tablet (80 mg total) by mouth daily. 90 tablet 3   carbamazepine  (TEGRETOL -XR) 100 MG 12 hr tablet Take 1 tablet (100 mg total) by mouth 2 (two) times daily. 180 tablet 3   cetirizine (ZYRTEC) 10 MG tablet Take 10 mg by mouth daily.     clonazePAM  (KLONOPIN ) 0.5 MG tablet TAKE 1 TABLET BY MOUTH TWICE DAILY AS NEEDED FOR ANXIETY 30 tablet 2   dicyclomine  (BENTYL ) 20 MG tablet Take 1 tablet (20 mg total) by mouth 3 (three) times daily between meals as needed for spasms. 60 tablet 3   fenofibrate  (TRICOR ) 145 MG tablet Take 1 tablet (145 mg total) by mouth daily. 90 tablet 3   glipiZIDE  (GLUCOTROL  XL) 5 MG 24 hr tablet Take 1 tablet (5 mg total) by mouth daily with breakfast. 90 tablet 3   hydrochlorothiazide  (MICROZIDE ) 12.5 MG capsule Take 1 capsule (12.5 mg total) by mouth daily. 90 capsule 3   losartan  (COZAAR ) 100 MG tablet Take 1 tablet (100 mg total) by mouth daily. 90 tablet 3   metoprolol  succinate (TOPROL -XL) 100 MG 24 hr tablet TAKE 1 TABLET(100 MG) BY MOUTH DAILY WITH OR  IMMEDIATELY FOLLOWING A MEAL 90 tablet 3   oxcarbazepine  (TRILEPTAL ) 600 MG tablet Take 1 tablet (600 mg total) by mouth 2 (two) times daily. 180 tablet 1   pantoprazole  (PROTONIX ) 40 MG tablet Take 1 tablet (40 mg total) by mouth daily. 90 tablet 3   pioglitazone  (ACTOS ) 45 MG tablet Take 1 tablet (45 mg total) by mouth daily. 90 tablet 3   potassium chloride  (KLOR-CON  M) 10 MEQ tablet Take 2 tablets (20 mEq total) by mouth daily. 90 tablet 3   topiramate  (TOPAMAX ) 100 MG tablet Take 0.5 tablets (50 mg total) by mouth 2 (two) times daily. 3 tablets 180 tablet 1   No current facility-administered medications on file  prior to visit.        ROS:  All others reviewed and negative.  Objective        PE:  BP 124/78 (BP Location: Left Arm, Patient Position: Sitting, Cuff Size: Normal)   Pulse 65   Temp 98.1 F (36.7 C) (Oral)   Ht 5' 9 (1.753 m)   Wt 195 lb (88.5 kg)   SpO2 97%   BMI 28.80 kg/m                 Constitutional: Pt appears in NAD               HENT: Head: NCAT.                Right Ear: External ear normal.                 Left Ear: External ear normal.                Eyes: . Pupils are equal, round, and reactive to light. Conjunctivae and EOM are normal               Nose: without d/c or deformity               Neck: Neck supple. Gross normal ROM               Cardiovascular: Normal rate and regular rhythm.                 Pulmonary/Chest: Effort normal and breath sounds without rales or wheezing.                Abd:  Soft, NT, ND, + BS, no organomegaly               Neurological: Pt is alert. At baseline orientation, motor grossly intact               Skin: Skin is warm. No rashes, no other new lesions, LE edema - none               Psychiatric: Pt behavior is normal without agitation   Micro: none  Cardiac tracings I have personally interpreted today:  none  Pertinent Radiological findings (summarize): none   Lab Results  Component Value Date   WBC 8.0  03/22/2022   HGB 12.4 03/22/2022   HCT 38.6 03/22/2022   PLT 243.0 03/22/2022   GLUCOSE 100 (H) 05/16/2023   CHOL 236 (H) 05/16/2023   TRIG 296.0 (H) 05/16/2023   HDL 55.10 05/16/2023   LDLDIRECT 98.0 08/10/2018   LDLCALC 122 (H) 05/16/2023   ALT 9 05/16/2023   AST 11 05/16/2023   NA 141 05/16/2023   K 4.2 05/16/2023   CL 107 05/16/2023   CREATININE 1.29 (H) 05/16/2023   BUN 28 (H) 05/16/2023   CO2 27 05/16/2023   TSH 0.34 (L) 05/16/2023   INR 1.35 12/23/2016   HGBA1C 6.3 05/16/2023   MICROALBUR 1.5 05/16/2023   Assessment/Plan:  Daralyn Bert [name] is a 73 y.o. Black or African American [2] female with  has a past medical history of ANGIOEDEMA (03/07/2008), ANXIETY (09/16/2006), ASTHMA (08/01/2006), CAD (coronary artery disease), Chronic diastolic CHF (congestive heart failure) (HCC) (04/06/2010), Complete heart block (HCC) (02/26/2014), COPD (chronic obstructive pulmonary disease) (HCC), DEPRESSION (09/16/2006), DIVERTICULOSIS, COLON, WITH HEMORRHAGE (04/06/2010), DM (diabetes mellitus) in pregnancy, delivered w/postpartum condition (08/30/2010), HYPERLIPIDEMIA (08/01/2006), HYPERTENSION (08/01/2006), Hypertr obst cardiomyop (08/01/2006), Impaired glucose tolerance (06/25/2010), LBBB (left bundle branch block) (06/17/2010),  OBESITY (09/16/2006), Renal artery aneurysm (HCC), SICKLE CELL ANEMIA (08/01/2006), Type II or unspecified type diabetes mellitus without mention of complication, uncontrolled (08/30/2010), and VENTRAL HERNIA (12/07/2006).  Vitamin D  deficiency Last vitamin D  Lab Results  Component Value Date   VD25OH 23.01 (L) 05/16/2023   Low, to start oral replacement   Essential hypertension BP Readings from Last 3 Encounters:  07/19/23 124/78  03/22/22 136/78  06/28/21 110/62   Stable, pt to continue medical treatment norvasc  10 every day, hct 12.5 every day, toprol  xl 100 qd   Stage 3a chronic kidney disease (HCC) Lab Results  Component Value Date   CREATININE 1.29 (H)  05/16/2023   Stable overall, cont to avoid nephrotoxins   Diabetes (HCC) Lab Results  Component Value Date   HGBA1C 6.3 05/16/2023   Stable, pt to continue current medical treatment and refill trulicity   Hyperlipidemia Lab Results  Component Value Date   LDLCALC 122 (H) 05/16/2023   Uncontrolled,, pt to restart lipitor 80 qd  Followup: Return in about 8 months (around 03/11/2024).  Lynwood Rush, MD 07/22/2023 6:27 PM [facility]

## 2023-07-19 NOTE — Patient Instructions (Addendum)
 Please continue all other medications as before, and refills have been done if requested.  Please have the pharmacy call with any other refills you may need.  Please continue your efforts at being more active, low cholesterol diet, and weight control.  You are otherwise up to date with prevention measures today.  Please keep your appointments with your specialists as you may have planned  No further lab work needed today  Please make an Appointment to return in 6 months after Jan 25 2024, or sooner if needed,

## 2023-07-21 ENCOUNTER — Ambulatory Visit: Payer: Self-pay | Admitting: Cardiology

## 2023-07-22 ENCOUNTER — Encounter: Payer: Self-pay | Admitting: Internal Medicine

## 2023-07-22 NOTE — Assessment & Plan Note (Signed)
 Lab Results  Component Value Date   LDLCALC 122 (H) 05/16/2023   Uncontrolled,, pt to restart lipitor 80 qd

## 2023-07-22 NOTE — Assessment & Plan Note (Signed)
 Lab Results  Component Value Date   CREATININE 1.29 (H) 05/16/2023   Stable overall, cont to avoid nephrotoxins

## 2023-07-22 NOTE — Assessment & Plan Note (Signed)
 Lab Results  Component Value Date   HGBA1C 6.3 05/16/2023   Stable, pt to continue current medical treatment and refill trulicity 

## 2023-07-22 NOTE — Assessment & Plan Note (Signed)
 BP Readings from Last 3 Encounters:  07/19/23 124/78  03/22/22 136/78  06/28/21 110/62   Stable, pt to continue medical treatment norvasc  10 every day, hct 12.5 every day, toprol  xl 100 qd

## 2023-07-22 NOTE — Assessment & Plan Note (Signed)
 Last vitamin D  Lab Results  Component Value Date   VD25OH 23.01 (L) 05/16/2023   Low, to start oral replacement

## 2023-07-31 ENCOUNTER — Encounter: Payer: Self-pay | Admitting: Internal Medicine

## 2023-07-31 ENCOUNTER — Ambulatory Visit (AMBULATORY_SURGERY_CENTER)

## 2023-07-31 VITALS — Ht 69.0 in | Wt 195.0 lb

## 2023-07-31 DIAGNOSIS — Z800 Family history of malignant neoplasm of digestive organs: Secondary | ICD-10-CM

## 2023-07-31 MED ORDER — NA SULFATE-K SULFATE-MG SULF 17.5-3.13-1.6 GM/177ML PO SOLN: 1.0000 | Freq: Once | ORAL | 0 refills | Status: AC

## 2023-07-31 NOTE — Patient Instructions (Signed)
 MEDICATION INSTRUCTIONS  Unless otherwise instructed, you should take regular prescription medications with a small sip of water as early as possible the morning of your procedure. You may continue taking your Aspirin . Hold all NSAIDS to include Meloxicam, Motrin, Celebrex, Anaprox, Naproxen, Advil, Ibuprofen, Aleve, Diclofenac, BC Powders, Goody Powders etc for 7 days prior to the colonoscopy- you may take Tylenol  (Acetaminophen ) products.   Take allowed medicines by 5:30 AM the day of your procedure   ORAL DIABETIC MEDICATION INSTRUCTIONS Metformin  Glipizide  Jardiance Glimepiride Farixga Januvia  Rybelsus, Xigduo, Sitagliptin, Synjardy Tradjenta Actos , Alogliptin Invokana  The day before your procedure: 7/22  Do not take your diabetic pill   The day of your procedure: 7/23  Do not take your diabetic pill   We will check your blood sugar levels during the admission process and again in Recovery before discharging you home ______________________________________________________________________________________  ONCE A WEEK INJECTIONS Ozempic,  Mounjaro, Wegovy, Trulicity , Tanzeum, Byetta, Victoza , Bydureon, & SymlinPen  -DO NOT TAKE 7 days prior to the procedure.  Last dose on or before Tuesday 7/15 failure to hold this medication will result in a cancellation or rescheduling of your procedure  ________________________________________________________________________________________

## 2023-07-31 NOTE — Progress Notes (Signed)

## 2023-08-02 DIAGNOSIS — E119 Type 2 diabetes mellitus without complications: Secondary | ICD-10-CM | POA: Diagnosis not present

## 2023-08-02 DIAGNOSIS — H52203 Unspecified astigmatism, bilateral: Secondary | ICD-10-CM | POA: Diagnosis not present

## 2023-08-02 LAB — HM DIABETES EYE EXAM

## 2023-08-03 ENCOUNTER — Encounter: Payer: Self-pay | Admitting: Cardiology

## 2023-08-03 ENCOUNTER — Ambulatory Visit: Attending: Cardiology | Admitting: Cardiology

## 2023-08-03 ENCOUNTER — Encounter: Payer: Self-pay | Admitting: Internal Medicine

## 2023-08-03 VITALS — BP 139/75 | HR 63 | Ht 69.0 in | Wt 195.0 lb

## 2023-08-03 DIAGNOSIS — Z9889 Other specified postprocedural states: Secondary | ICD-10-CM | POA: Diagnosis not present

## 2023-08-03 DIAGNOSIS — I10 Essential (primary) hypertension: Secondary | ICD-10-CM

## 2023-08-03 DIAGNOSIS — I517 Cardiomegaly: Secondary | ICD-10-CM

## 2023-08-03 DIAGNOSIS — I442 Atrioventricular block, complete: Secondary | ICD-10-CM | POA: Diagnosis not present

## 2023-08-03 DIAGNOSIS — Z950 Presence of cardiac pacemaker: Secondary | ICD-10-CM | POA: Diagnosis not present

## 2023-08-03 LAB — CUP PACEART INCLINIC DEVICE CHECK
Date Time Interrogation Session: 20250710142645
Implantable Lead Connection Status: 753985
Implantable Lead Connection Status: 753985
Implantable Lead Implant Date: 20160203
Implantable Lead Implant Date: 20160203
Implantable Lead Location: 753859
Implantable Lead Location: 753860
Implantable Lead Model: 5076
Implantable Lead Model: 5076
Implantable Pulse Generator Implant Date: 20160203

## 2023-08-03 NOTE — Progress Notes (Signed)
  Electrophysiology Office Note:   Date:  08/05/2023  ID:  Stacey [name], DOB 01-17-1951, MRN [id number]  Primary Cardiologist: None Electrophysiologist: Fonda Kitty, MD      History of Present Illness:   Stacey [name] is a 73 y.o. female with h/o hypertrophic cardiomyopathy s/p myectomy, CHB s/p dual chamber pacemaker in 2016, hypertension who is being seen today for device follow up.  Discussed the use of AI scribe software for clinical note transcription with the patient, who gave verbal consent to proceed.  History of Present Illness Stacey [name] is a 73 year old female with hypertrophic cardiomyopathy who presents for follow-up. She was previously followed by Dr. Fernande.  She has a history of hypertrophic cardiomyopathy and underwent a myectomy in the past. A pacemaker was also placed for complete heart block, and she has been stable since then. She has not experienced any recent hospitalizations.No symptoms of abnormal heart rhythms such as heart racing, skipping, or fluttering. No dizziness, lightheadedness, or fluid retention. No swelling in her legs. She maintains her weight through diet and is trying to lose weight.  She has no new or acute complaints today.   Review of systems complete and found to be negative unless listed in HPI.   EP Information / Studies Reviewed:    EKG is ordered today. Personal review as below.  EKG Interpretation Date/Time:  Thursday August 03 2023 14:03:31 EDT Ventricular Rate:  63 PR Interval:    QRS Duration:  192 QT Interval:  486 QTC Calculation: 497 R Axis:   -80  Text Interpretation: Atrial sensed Ventricular-paced rhythm When compared with ECG of 25-Mar-2018 03:01,  Some atrial paced beats, otherwise no significant change Confirmed by Kitty Fonda [phone]) on 08/03/2023 2:23:33 PM    Physical Exam:   VS:  BP 139/75   Pulse 63   Ht 5' 9 (1.753 m)   Wt 195 lb (88.5 kg)   SpO2 94%   BMI 28.80  kg/m    Wt Readings from Last 3 Encounters:  08/03/23 195 lb (88.5 kg)  07/31/23 195 lb (88.5 kg)  07/19/23 195 lb (88.5 kg)     GEN: Well nourished, well developed in no acute distress NECK: No JVD CARDIAC: Normal rate, regular rhythm. Well healed left chest pacer pocket. RESPIRATORY:  Clear to auscultation without rales, wheezing or rhonchi  ABDOMEN: Soft, non-distended EXTREMITIES:  No edema; No deformity   ASSESSMENT AND PLAN:    #Complete heart block s/p dual chamber pacemaker: - In clinic device interrogation was performed.  Appropriate device function and stable lead parameters.  Estimated 10 months longevity.  No abnormal rhythms.  Presenting rhythm is AP-VP. - Continue remote monitoring.  #HCM s/p myectomy: No current symptoms, no arrhythmias, no recent hospitalizations, and no edema or dyspnea. She maintains weight and activity levels. -Continue metoprolol  XL 100mg  daily. - Monitor for any arrhythmias with pacemaker.  #Hypertension -At goal today.  Recommend checking blood pressures 1-2 times per week at home and recording the values.  Recommend bringing these recordings to the primary care physician.   Follow up with Dr. Kitty in 12 months  Signed, Fonda Kitty, MD

## 2023-08-03 NOTE — Patient Instructions (Signed)

## 2023-08-13 ENCOUNTER — Ambulatory Visit: Payer: Self-pay | Admitting: Cardiology

## 2023-08-15 NOTE — Progress Notes (Signed)
 Remote pacemaker transmission.

## 2023-08-15 NOTE — Addendum Note (Signed)
 Addended by: TAWNI DRILLING D on: 08/15/2023 04:41 PM   Modules accepted: Orders

## 2023-08-16 ENCOUNTER — Encounter: Payer: Self-pay | Admitting: Internal Medicine

## 2023-08-16 ENCOUNTER — Ambulatory Visit (AMBULATORY_SURGERY_CENTER): Admitting: Internal Medicine

## 2023-08-16 VITALS — BP 166/71 | HR 60 | Temp 97.2°F | Resp 17 | Ht 69.0 in | Wt 195.0 lb

## 2023-08-16 DIAGNOSIS — Z800 Family history of malignant neoplasm of digestive organs: Secondary | ICD-10-CM

## 2023-08-16 DIAGNOSIS — Z1211 Encounter for screening for malignant neoplasm of colon: Secondary | ICD-10-CM | POA: Diagnosis not present

## 2023-08-16 DIAGNOSIS — K5730 Diverticulosis of large intestine without perforation or abscess without bleeding: Secondary | ICD-10-CM | POA: Diagnosis not present

## 2023-08-16 DIAGNOSIS — J449 Chronic obstructive pulmonary disease, unspecified: Secondary | ICD-10-CM | POA: Diagnosis not present

## 2023-08-16 DIAGNOSIS — K648 Other hemorrhoids: Secondary | ICD-10-CM

## 2023-08-16 DIAGNOSIS — F32A Depression, unspecified: Secondary | ICD-10-CM | POA: Diagnosis not present

## 2023-08-16 DIAGNOSIS — E669 Obesity, unspecified: Secondary | ICD-10-CM | POA: Diagnosis not present

## 2023-08-16 MED ORDER — SODIUM CHLORIDE 0.9 % IV SOLN: 500.0000 mL | Freq: Once | INTRAVENOUS | Status: DC

## 2023-08-16 NOTE — Progress Notes (Signed)
 [facility] History and Physical   Primary Care Physician:  Norleen Lynwood ORN, MD   Reason for Procedure:   FHx CRCA - daughter  Plan:    colonoscopy     HPI: Stacey [name] is a 73 y.o. female here for screening colonoscopy in setting of family hx CRCA   Past Medical History:  Diagnosis Date   ANGIOEDEMA 03/07/2008   a. with ACE-I   ANXIETY 09/16/2006   ASTHMA 08/01/2006   CAD (coronary artery disease)    a. 01/2010 : Minimal plaque at cardiac catheterization - CFX 20%, EF 70%;  b. 08/2012 Cath: Nl Cors, EF 65%.   Chronic diastolic CHF (congestive heart failure) (HCC) 04/06/2010   a. In setting of HOCM.   Complete heart block (HCC) 02/26/2014   a. 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 s/p septal myomectomy 4/12 at [facility] with Dr. Alford;  b. echo 5/12: EF 60-65%, LVOT peak 18 mmHg; grade 1 diast dysfxn, mild SAM (improved since myomectomy;  c. 03/2012 Echo: EF 60-65%, mod-sev basal septal asymm hypertrophy, basal septal HK, LVOT grad , Gr 1 DD, , SAM, Mild MR, nl RV, PASP ; 08/2012 Echo: technically difficult, doubt LVOT obstruction, EF 60%, Gr 1 DD, mod-sev dil LA.   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HCC)    SICKLE CELL ANEMIA 08/01/2006   Type II or unspecified type diabetes mellitus without mention of complication, uncontrolled 08/30/2010   VENTRAL HERNIA 12/07/2006    Past Surgical History:  Procedure Laterality Date   ABDOMINAL HYSTERECTOMY  1987   CARDIAC SURGERY     COLONOSCOPY N/A 12/26/2016   Procedure: COLONOSCOPY;  Surgeon: Teressa Toribio SQUIBB, MD;  Location:  [facility];  Service: Endoscopy;  Laterality: N/A;   ELECTROPHYSIOLOGY STUDY N/A 09/12/2012   Procedure: ELECTROPHYSIOLOGY STUDY;  Surgeon: Elspeth JAYSON Sage, MD;  Location: [facility] CATH LAB;  Service: Cardiovascular;  Laterality: N/A;   LEFT HEART CATH  Aug. 18, 2014   Medtronic heart device   LEFT HEART CATHETERIZATION WITH CORONARY ANGIOGRAM N/A 09/10/2012   Procedure: LEFT HEART CATHETERIZATION WITH CORONARY ANGIOGRAM;  Surgeon: Lonni JONETTA Cash, MD;  Location: [facility] CATH LAB;  Service: Cardiovascular;  Laterality: N/A;   MYOMECTOMY     Septal   PERMANENT PACEMAKER INSERTION N/A 02/26/2014   MDT Advisa dual chamber pacemaker implanted by Dr Sage for heart block   VENTRAL HERNIA REPAIR  06/2006    Prior to Admission medications   Medication Sig Start Date End Date Taking? Authorizing Provider  acetaminophen  (TYLENOL ) 500 MG tablet Take 500 mg by mouth daily as needed.   Yes [provider]  atorvastatin  (LIPITOR) 80 MG tablet Take 1 tablet (80 mg total) by mouth daily. 05/16/23  Yes Norleen Lynwood ORN, MD  carbamazepine  (TEGRETOL -XR) 100 MG 12 hr tablet Take 1 tablet (100 mg total) by mouth 2 (two) times daily. 05/16/23  Yes Norleen Lynwood ORN, MD  cetirizine (ZYRTEC) 10 MG tablet Take 10 mg by mouth daily.   Yes [provider]  fenofibrate  (TRICOR ) 145 MG tablet Take 1 tablet (145 mg total) by mouth daily. 05/16/23  Yes Norleen Lynwood  W, MD  metoprolol  succinate (TOPROL -XL) 100 MG 24 hr tablet TAKE 1 TABLET(100 MG) BY MOUTH DAILY WITH OR IMMEDIATELY FOLLOWING A MEAL 05/16/23  Yes Norleen Lynwood ORN, MD  oxcarbazepine  (TRILEPTAL ) 600 MG tablet Take 1 tablet (600 mg total) by mouth 2 (two) times daily. 05/16/23  Yes Norleen Lynwood ORN, MD  topiramate  (TOPAMAX ) 100 MG tablet Take 0.5 tablets (50 mg total) by mouth 2 (two) times daily. 3 tablets 05/16/23  Yes Norleen Lynwood ORN, MD  albuterol  (VENTOLIN  HFA) 108 [phone] Base) MCG/ACT inhaler Inhale 2 puffs into the lungs every 6 (six) hours as needed for wheezing or  shortness of breath. 05/16/23   Norleen Lynwood ORN, MD  amLODipine  (NORVASC ) 10 MG tablet TAKE 1 TABLET(10 MG) BY MOUTH DAILY 05/16/23   Norleen Lynwood ORN, MD  clonazePAM  (KLONOPIN ) 0.5 MG tablet TAKE 1 TABLET BY MOUTH TWICE DAILY AS NEEDED FOR ANXIETY Patient not taking: Reported on 08/16/2023 04/09/18   Norleen Lynwood ORN, MD  dicyclomine  (BENTYL ) 20 MG tablet Take 1 tablet (20 mg total) by mouth 3 (three) times daily between meals as needed for spasms. 03/19/20   Kerman Vina HERO, NP  Dulaglutide  (TRULICITY ) 1.5 MG/0.5ML SOAJ Inject 1.5 mg into the skin once a week. 07/19/23   Norleen Lynwood ORN, MD  glipiZIDE  (GLUCOTROL  XL) 5 MG 24 hr tablet Take 1 tablet (5 mg total) by mouth daily with breakfast. Patient taking differently: Take 5 mg by mouth daily as needed. 05/16/23   Norleen Lynwood ORN, MD  hydrochlorothiazide  (MICROZIDE ) 12.5 MG capsule Take 1 capsule (12.5 mg total) by mouth daily. 05/16/23   Norleen Lynwood ORN, MD  losartan  (COZAAR ) 100 MG tablet Take 1 tablet (100 mg total) by mouth daily. 05/16/23   Norleen Lynwood ORN, MD  pantoprazole  (PROTONIX ) 40 MG tablet Take 1 tablet (40 mg total) by mouth daily. 05/16/23   Norleen Lynwood ORN, MD  pioglitazone  (ACTOS ) 45 MG tablet Take 1 tablet (45 mg total) by mouth daily. 05/16/23   Norleen Lynwood ORN, MD  potassium chloride  (KLOR-CON  M) 10 MEQ tablet Take 2 tablets (20 mEq total) by mouth daily. Patient taking differently: Take 10 mEq by mouth daily. 05/16/23   Norleen Lynwood ORN, MD    Current Outpatient Medications  Medication Sig Dispense Refill   acetaminophen  (TYLENOL ) 500 MG tablet Take 500 mg by mouth daily as needed.     atorvastatin  (LIPITOR) 80 MG tablet Take 1 tablet (80 mg total) by mouth daily. 90 tablet 3   carbamazepine  (TEGRETOL -XR) 100 MG 12 hr tablet Take 1 tablet (100 mg total) by mouth 2 (two) times daily. 180 tablet 3   cetirizine (ZYRTEC) 10 MG tablet Take 10 mg by mouth daily.     fenofibrate  (TRICOR ) 145 MG tablet Take 1 tablet (145 mg total) by mouth daily. 90 tablet 3    metoprolol  succinate (TOPROL -XL) 100 MG 24 hr tablet TAKE 1 TABLET(100 MG) BY MOUTH DAILY WITH OR IMMEDIATELY FOLLOWING A MEAL 90 tablet 3   oxcarbazepine  (TRILEPTAL ) 600 MG tablet Take 1 tablet (600 mg total) by mouth 2 (two) times daily. 180 tablet 1   topiramate  (TOPAMAX ) 100 MG tablet Take 0.5 tablets (50 mg total) by mouth 2 (two) times daily. 3 tablets 180 tablet 1   albuterol  (VENTOLIN  HFA) 108 (90 Base) MCG/ACT inhaler Inhale 2 puffs into the lungs every 6 (six) hours as needed for wheezing or shortness of breath. 18 g 5   amLODipine  (NORVASC ) 10 MG tablet TAKE 1 TABLET(10 MG)  BY MOUTH DAILY 90 tablet 3   clonazePAM  (KLONOPIN ) 0.5 MG tablet TAKE 1 TABLET BY MOUTH TWICE DAILY AS NEEDED FOR ANXIETY (Patient not taking: Reported on 08/16/2023) 30 tablet 2   dicyclomine  (BENTYL ) 20 MG tablet Take 1 tablet (20 mg total) by mouth 3 (three) times daily between meals as needed for spasms. 60 tablet 3   Dulaglutide  (TRULICITY ) 1.5 MG/0.5ML SOAJ Inject 1.5 mg into the skin once a week. 6 mL 3   glipiZIDE  (GLUCOTROL  XL) 5 MG 24 hr tablet Take 1 tablet (5 mg total) by mouth daily with breakfast. (Patient taking differently: Take 5 mg by mouth daily as needed.) 90 tablet 3   hydrochlorothiazide  (MICROZIDE ) 12.5 MG capsule Take 1 capsule (12.5 mg total) by mouth daily. 90 capsule 3   losartan  (COZAAR ) 100 MG tablet Take 1 tablet (100 mg total) by mouth daily. 90 tablet 3   pantoprazole  (PROTONIX ) 40 MG tablet Take 1 tablet (40 mg total) by mouth daily. 90 tablet 3   pioglitazone  (ACTOS ) 45 MG tablet Take 1 tablet (45 mg total) by mouth daily. 90 tablet 3   potassium chloride  (KLOR-CON  M) 10 MEQ tablet Take 2 tablets (20 mEq total) by mouth daily. (Patient taking differently: Take 10 mEq by mouth daily.) 90 tablet 3   Current Facility-Administered Medications  Medication Dose Route Frequency Provider Last Rate Last Admin   0.9 %  sodium chloride  infusion  500 mL Intravenous Once Avram Lupita BRAVO, MD         Allergies as of 08/16/2023 - Review Complete 08/16/2023  Allergen Reaction Noted   Dairy aid [tilactase] Diarrhea and Other (See Comments) 10/21/2013   Ace inhibitors Other (See Comments) 01/14/2010   Aspirin  Other (See Comments)    Codeine  Other (See Comments) 09/16/2006   Metformin  and related Other (See Comments) 02/26/2016   Soy allergy (obsolete) Other (See Comments) 06/17/2010    Family History  Adopted: Yes  Problem Relation Age of Onset   Heart disease Father    Heart attack Father    Hypertension Father        before age 42   Heart attack Sister    Hyperlipidemia Sister    Stroke Sister    Hypertension Sister    Hyperlipidemia Brother    Hypertension Brother    Diabetes Paternal Grandmother    Colon cancer Daughter        and bleeding disorder   Hyperlipidemia Daughter    Heart disease Daughter    Clotting disorder Daughter    Hypertension Son    Clotting disorder Other        granddaughter   Breast cancer Neg Hx    BRCA 1/2 Neg Hx     Social History   Socioeconomic History   Marital status: Widowed    Spouse name: Not on file   Number of children: 3   Years of education: Not on file   Highest education level: Master's degree (e.g., MA, MS, MEng, MEd, MSW, MBA)  Occupational History   Occupation: retired Magazine features editor: [employer]  Tobacco Use   Smoking status: Former    Current packs/day: 0.00    Average packs/day: 0.3 packs/day for 20.0 years (5.0 ttl pk-yrs)    Types: Cigarettes    Start date: 01/24/1978    Quit date: 01/24/1998    Years since quitting: 25.5    Passive exposure: Past   Smokeless tobacco: Never   Tobacco comments:    Quit  smoking 2001. Smoked on and off for 20 years. Smoked 2-3 cigars daily  Vaping Use   Vaping status: Never Used  Substance and Sexual Activity   Alcohol use: No    Alcohol/week: 0.0 standard drinks of alcohol   Drug use: No   Sexual activity: Not Currently  Other Topics Concern   Not on file   Social History Narrative   Lives in [place] with husband.  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   Social Drivers of Corporate investment banker Strain: Low Risk  (07/15/2023)   Overall Financial Resource Strain (CARDIA)    Difficulty of Paying Living Expenses: Not very hard  Food Insecurity: No Food Insecurity (07/15/2023)   Hunger Vital Sign    Worried About Running Out of Food in the Last Year: Never true    Ran Out of Food in the Last Year: Never true  Transportation Needs: No Transportation Needs (07/15/2023)   PRAPARE - Administrator, Civil Service (Medical): No    Lack of Transportation (Non-Medical): No  Physical Activity: Inactive (07/15/2023)   Exercise Vital Sign    Days of Exercise per Week: 0 days    Minutes of Exercise per Session: Not on file  Stress: No Stress Concern Present (07/15/2023)   [employer] of Occupational Health - Occupational Stress Questionnaire    Feeling of Stress: Only a little  Social Connections: Socially Isolated (07/15/2023)   Social Connection and Isolation Panel    Frequency of Communication with Friends and Family: More than three times a week    Frequency of Social Gatherings with Friends and Family: Once a week    Attends Religious Services: Never    Database administrator or Organizations: No    Attends Engineer, structural: Not on file    Marital Status: Widowed  Intimate Partner Violence: Not At Risk (06/20/2023)   Humiliation, Afraid, Rape, and Kick questionnaire    Fear of Current or Ex-Partner: No    Emotionally Abused: No    Physically Abused: No    Sexually Abused: No    Review of Systems:  All other review of systems negative except as mentioned in the HPI.  Physical Exam: Vital signs BP (!) 157/60   Pulse 60   Temp (!) 97.2 F (36.2 C) (Temporal)   Ht 5' 9 (1.753 m)   Wt 195 lb (88.5 kg)   SpO2 92%   BMI 28.80 kg/m   General:   Alert,  Well-developed,  well-nourished, pleasant and cooperative in NAD Lungs:  Clear throughout to auscultation.   Heart:  Regular rate and rhythm; no murmurs, clicks, rubs,  or gallops. Abdomen:  Soft, nontender and nondistended. Normal bowel sounds.   Neuro/Psych:  Alert and cooperative. Normal mood and affect. A and O x 3   @Kitana Gage  CHARLENA Commander, MD, [facility] [phone] (pager) 08/16/2023 9:52 AM@

## 2023-08-16 NOTE — Op Note (Signed)
 [facility] Patient Name: Stacey [name] Procedure Date: 08/16/2023 9:52 AM MRN: [id number] Endoscopist: Lupita FORBES Commander , MD, [id number] Age: 73 Referring MD:  Date of Birth: Dec 04, 1950 Gender: Female Account #: [id number] Procedure:                Colonoscopy Indications:              Screening in patient at increased risk: Colorectal                            cancer in child before age 68 Medicines:                Monitored Anesthesia Care Procedure:                Pre-Anesthesia Assessment:                           - Prior to the procedure, a History and Physical                            was performed, and patient medications and                            allergies were reviewed. The patient's tolerance of                            previous anesthesia was also reviewed. The risks                            and benefits of the procedure and the sedation                            options and risks were discussed with the patient.                            All questions were answered, and informed consent                            was obtained. Prior Anticoagulants: The patient has                            taken no anticoagulant or antiplatelet agents. ASA                            Grade Assessment: III - A patient with severe                            systemic disease. After reviewing the risks and                            benefits, the patient was deemed in satisfactory                            condition to undergo the procedure.  After obtaining informed consent, the colonoscope                            was passed under direct vision. Throughout the                            procedure, the patient's blood pressure, pulse, and                            oxygen saturations were monitored continuously. The                            Olympus Scope SN [id number] was introduced through the                            anus and  advanced to the the cecum, identified by                            appendiceal orifice and ileocecal valve. The                            colonoscopy was performed without difficulty. The                            patient tolerated the procedure well. The quality                            of the bowel preparation was good. The ileocecal                            valve, appendiceal orifice, and rectum were                            photographed. The bowel preparation used was SUPREP                            via split dose instruction. Scope In: 10:04:02 AM Scope Out: 10:13:58 AM Total Procedure Duration: 0 hours 9 minutes 56 seconds  Findings:                 The perianal and digital rectal examinations were                            normal.                           Many large-mouthed, medium-mouthed and                            small-mouthed diverticula were found in the entire                            colon.  Internal hemorrhoids were found.                           The exam was otherwise without abnormality on                            direct and retroflexion views. Complications:            No immediate complications. Estimated Blood Loss:     Estimated blood loss: none. Impression:               - Severe diverticulosis in the entire examined                            colon.                           - Internal hemorrhoids.                           - The examination was otherwise normal on direct                            and retroflexion views.                           - No specimens collected. Recommendation:           - Patient has a contact number available for                            emergencies. The signs and symptoms of potential                            delayed complications were discussed with the                            patient. Return to normal activities tomorrow.                            Written discharge  instructions were provided to the                            patient.                           - Resume previous diet.                           - Continue present medications.                           - No recommendation at this time regarding repeat                            colonoscopy due to age. She may consider a repeat  at age 40 since daughter had colon cancer. Will                            defer to patient and PCP to decide about a referral                            then as health conditions/status may preclude Lupita FORBES Commander, MD 08/16/2023 10:22:55 AM This report has been signed electronically.

## 2023-08-16 NOTE — Progress Notes (Signed)
 Pt's states no medical or surgical changes since previsit or office visit.

## 2023-08-16 NOTE — Patient Instructions (Addendum)
 Diverticulosis and hemorrhoids seen but no polyps.  You may consider having a repeat colonoscopy in 5 years if health status allows, I am not recommending it with certainty today given your health problems.  I appreciate the opportunity to care for you. Lupita CHARLENA Commander, MD, FACG   YOU HAD AN ENDOSCOPIC PROCEDURE TODAY AT THE [facility]:   Refer to the procedure report that was given to you for any specific questions about what was found during the examination.  If the procedure report does not answer your questions, please call your gastroenterologist to clarify.  If you requested that your care partner not be given the details of your procedure findings, then the procedure report has been included in a sealed envelope for you to review at your convenience later.  YOU SHOULD EXPECT: Some feelings of bloating in the abdomen. Passage of more gas than usual.  Walking can help get rid of the air that was put into your GI tract during the procedure and reduce the bloating. If you had a lower endoscopy (such as a colonoscopy or flexible sigmoidoscopy) you may notice spotting of blood in your stool or on the toilet paper. If you underwent a bowel prep for your procedure, you may not have a normal bowel movement for a few days.  Please Note:  You might notice some irritation and congestion in your nose or some drainage.  This is from the oxygen used during your procedure.  There is no need for concern and it should clear up in a day or so.  SYMPTOMS TO REPORT IMMEDIATELY:  Following lower endoscopy (colonoscopy or flexible sigmoidoscopy):  Excessive amounts of blood in the stool  Significant tenderness or worsening of abdominal pains  Swelling of the abdomen that is new, acute  Fever of 100F or higher  For urgent or emergent issues, a gastroenterologist can be reached at any hour by calling [phone]. Do not use MyChart messaging for urgent concerns.    DIET:  We do recommend a  small meal at first, but then you may proceed to your regular diet.  Drink plenty of fluids but you should avoid alcoholic beverages for 24 hours.  ACTIVITY:  You should plan to take it easy for the rest of today and you should NOT DRIVE or use heavy machinery until tomorrow (because of the sedation medicines used during the test).    FOLLOW UP: Our staff will call the number listed on your records the next business day following your procedure.  We will call around 7:15- 8:00 am to check on you and address any questions or concerns that you may have regarding the information given to you following your procedure. If we do not reach you, we will leave a message.      SIGNATURES/CONFIDENTIALITY: You and/or your care partner have signed paperwork which will be entered into your electronic medical record.  These signatures attest to the fact that that the information above on your After Visit Summary has been reviewed and is understood.  Full responsibility of the confidentiality of this discharge information lies with you and/or your care-partner.

## 2023-08-16 NOTE — Progress Notes (Signed)
 Sedate, gd SR, tolerated procedure well, VSS, report to RN

## 2023-08-17 ENCOUNTER — Telehealth: Payer: Self-pay | Admitting: *Deleted

## 2023-08-17 NOTE — Telephone Encounter (Signed)
 Attempted post procedure follow up call.  No answer - unable to LVM.

## 2023-09-19 ENCOUNTER — Ambulatory Visit (INDEPENDENT_AMBULATORY_CARE_PROVIDER_SITE_OTHER): Admitting: Internal Medicine

## 2023-09-19 ENCOUNTER — Encounter: Payer: Self-pay | Admitting: Internal Medicine

## 2023-09-19 VITALS — BP 130/78 | HR 63 | Temp 98.2°F | Ht 69.0 in | Wt 198.2 lb

## 2023-09-19 DIAGNOSIS — L723 Sebaceous cyst: Secondary | ICD-10-CM | POA: Diagnosis not present

## 2023-09-19 DIAGNOSIS — I10 Essential (primary) hypertension: Secondary | ICD-10-CM

## 2023-09-19 DIAGNOSIS — E7800 Pure hypercholesterolemia, unspecified: Secondary | ICD-10-CM | POA: Diagnosis not present

## 2023-09-19 DIAGNOSIS — E1165 Type 2 diabetes mellitus with hyperglycemia: Secondary | ICD-10-CM

## 2023-09-19 DIAGNOSIS — Z7984 Long term (current) use of oral hypoglycemic drugs: Secondary | ICD-10-CM

## 2023-09-19 DIAGNOSIS — L089 Local infection of the skin and subcutaneous tissue, unspecified: Secondary | ICD-10-CM

## 2023-09-19 DIAGNOSIS — E559 Vitamin D deficiency, unspecified: Secondary | ICD-10-CM | POA: Diagnosis not present

## 2023-09-19 DIAGNOSIS — Z7985 Long-term (current) use of injectable non-insulin antidiabetic drugs: Secondary | ICD-10-CM | POA: Diagnosis not present

## 2023-09-19 DIAGNOSIS — N1831 Chronic kidney disease, stage 3a: Secondary | ICD-10-CM

## 2023-09-19 MED ORDER — DOXYCYCLINE HYCLATE 100 MG PO TABS: 100.0000 mg | ORAL_TABLET | Freq: Two times a day (BID) | ORAL | 0 refills | Status: AC

## 2023-09-19 NOTE — Assessment & Plan Note (Signed)
 Last vitamin D  Lab Results  Component Value Date   VD25OH 23.01 (L) 05/16/2023   Low, to start oral replacement

## 2023-09-19 NOTE — Progress Notes (Signed)
 Patient ID: Stacey [name], female   DOB: 07-15-1950, 73 y.o.   MRN: [id number]        Chief Complaint: follow up infected sebacious cyst, low vit d, hld       HPI:  Stacey [name] is a 73 y.o. female here overall doing ok , except for 3 days onset low mid sternal red, tender, raised without drainage.  No fever, chills.  Pt denies chest pain, increased sob or doe, wheezing, orthopnea, PND, increased LE swelling, palpitations, dizziness or syncope.   Pt denies polydipsia, polyuria, or new focal neuro s/s.    Pt denies recent wt loss, night sweats, loss of appetite, or other constitutional symptoms         Wt Readings from Last 3 Encounters:  09/19/23 198 lb 3.2 oz (89.9 kg)  08/16/23 195 lb (88.5 kg)  08/03/23 195 lb (88.5 kg)   BP Readings from Last 3 Encounters:  09/19/23 130/78  08/16/23 (!) 166/71  08/03/23 139/75         Past Medical History:  Diagnosis Date   Allergy    ANGIOEDEMA 03/07/2008   a. with ACE-I   ANXIETY 09/16/2006   ASTHMA 08/01/2006   CAD (coronary artery disease)    a. 01/2010 : Minimal plaque at cardiac catheterization - CFX 20%, EF 70%;  b. 08/2012 Cath: Nl Cors, EF 65%.   Chronic diastolic CHF (congestive heart failure) (HCC) 04/06/2010   a. In setting of HOCM.   Complete heart block (HCC) 02/26/2014   a. 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 s/p septal myomectomy 4/12 at [facility] with Dr. Alford;  b. echo 5/12: EF 60-65%, LVOT peak 18 mmHg; grade 1 diast dysfxn, mild SAM (improved since myomectomy;  c. 03/2012 Echo: EF 60-65%, mod-sev basal septal asymm hypertrophy, basal septal HK, LVOT grad , Gr 1 DD, , SAM, Mild MR, nl RV,  PASP ; 08/2012 Echo: technically difficult, doubt LVOT obstruction, EF 60%, Gr 1 DD, mod-sev dil LA.   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HCC)    SICKLE CELL ANEMIA 08/01/2006   Type II or unspecified type diabetes mellitus without mention of complication, uncontrolled 08/30/2010   VENTRAL HERNIA 12/07/2006   Past Surgical History:  Procedure Laterality Date   ABDOMINAL HYSTERECTOMY  1987   CARDIAC SURGERY     COLONOSCOPY N/A 12/26/2016   Procedure: COLONOSCOPY;  Surgeon: Teressa Toribio SQUIBB, MD;  Location: [facility];  Service: Endoscopy;  Laterality: N/A;   ELECTROPHYSIOLOGY STUDY N/A 09/12/2012   Procedure: ELECTROPHYSIOLOGY STUDY;  Surgeon: Elspeth JAYSON Sage, MD;  Location: [facility] CATH LAB;  Service: Cardiovascular;  Laterality: N/A;   LEFT HEART CATH  Aug. 18, 2014   Medtronic heart device   LEFT HEART CATHETERIZATION WITH CORONARY ANGIOGRAM N/A 09/10/2012   Procedure: LEFT HEART CATHETERIZATION WITH CORONARY ANGIOGRAM;  Surgeon: Lonni JONETTA Cash, MD;  Location: [facility] CATH LAB;  Service: Cardiovascular;  Laterality: N/A;   MYOMECTOMY     Septal   PERMANENT PACEMAKER INSERTION N/A 02/26/2014   MDT Advisa dual chamber pacemaker implanted by Dr Sage for heart block   VENTRAL HERNIA REPAIR  06/2006  reports that she quit smoking about 25 years ago. Her smoking use included cigarettes. She started smoking about 45 years ago. She has a 5 pack-year smoking history. She has been exposed to tobacco smoke. She has never used smokeless tobacco. She reports that she does not drink alcohol and does not use drugs. family history includes COPD in her father; Cancer in her daughter; Clotting disorder in her daughter and another family member; Colon cancer in her daughter; Diabetes in her paternal grandmother; Heart attack in her father and sister; Heart disease in her daughter and father; Hyperlipidemia in her  brother, daughter, and sister; Hypertension in her brother, father, sister, and son; Stroke in her brother and sister. She was adopted. Allergies  Allergen Reactions   Dairy Aid [Tilactase] Diarrhea and Other (See Comments)    Bloating and gastric distress   Ace Inhibitors Other (See Comments)    REACTION: angioedema right eye   Aspirin  Other (See Comments)    Bleeding    Codeine  Other (See Comments)    Hallucinations, can take if with someone.   Metformin  And Related Other (See Comments)    GI upset   Soy Allergy (Obsolete) Other (See Comments)    Bleeding & cramps, diarrhea    Current Outpatient Medications on File Prior to Visit  Medication Sig Dispense Refill   acetaminophen  (TYLENOL ) 500 MG tablet Take 500 mg by mouth daily as needed.     albuterol  (VENTOLIN  HFA) 108 (90 Base) MCG/ACT inhaler Inhale 2 puffs into the lungs every 6 (six) hours as needed for wheezing or shortness of breath. 18 g 5   amLODipine  (NORVASC ) 10 MG tablet TAKE 1 TABLET(10 MG) BY MOUTH DAILY 90 tablet 3   atorvastatin  (LIPITOR) 80 MG tablet Take 1 tablet (80 mg total) by mouth daily. 90 tablet 3   carbamazepine  (TEGRETOL -XR) 100 MG 12 hr tablet Take 1 tablet (100 mg total) by mouth 2 (two) times daily. 180 tablet 3   cetirizine (ZYRTEC) 10 MG tablet Take 10 mg by mouth daily.     dicyclomine  (BENTYL ) 20 MG tablet Take 1 tablet (20 mg total) by mouth 3 (three) times daily between meals as needed for spasms. 60 tablet 3   Dulaglutide  (TRULICITY ) 1.5 MG/0.5ML SOAJ Inject 1.5 mg into the skin once a week. 6 mL 3   fenofibrate  (TRICOR ) 145 MG tablet Take 1 tablet (145 mg total) by mouth daily. 90 tablet 3   glipiZIDE  (GLUCOTROL  XL) 5 MG 24 hr tablet Take 1 tablet (5 mg total) by mouth daily with breakfast. (Patient taking differently: Take 5 mg by mouth daily as needed.) 90 tablet 3   hydrochlorothiazide  (MICROZIDE ) 12.5 MG capsule Take 1 capsule (12.5 mg total) by mouth daily. 90 capsule 3   losartan  (COZAAR )  100 MG tablet Take 1 tablet (100 mg total) by mouth daily. 90 tablet 3   metoprolol  succinate (TOPROL -XL) 100 MG 24 hr tablet TAKE 1 TABLET(100 MG) BY MOUTH DAILY WITH OR IMMEDIATELY FOLLOWING A MEAL 90 tablet 3   oxcarbazepine  (TRILEPTAL ) 600 MG tablet Take 1 tablet (600 mg total) by mouth 2 (two) times daily. 180 tablet 1   pantoprazole  (PROTONIX ) 40 MG tablet Take 1 tablet (40 mg total) by mouth daily. 90 tablet 3   pioglitazone  (ACTOS ) 45 MG tablet Take 1 tablet (45 mg total) by mouth daily. 90 tablet 3   potassium chloride  (KLOR-CON  M) 10 MEQ tablet Take 2 tablets (20 mEq total) by mouth daily. (Patient taking differently: Take 10  mEq by mouth daily.) 90 tablet 3   topiramate  (TOPAMAX ) 100 MG tablet Take 0.5 tablets (50 mg total) by mouth 2 (two) times daily. 3 tablets 180 tablet 1   clonazePAM  (KLONOPIN ) 0.5 MG tablet TAKE 1 TABLET BY MOUTH TWICE DAILY AS NEEDED FOR ANXIETY (Patient not taking: Reported on 09/19/2023) 30 tablet 2   No current facility-administered medications on file prior to visit.        ROS:  All others reviewed and negative.  Objective        PE:  BP 130/78   Pulse 63   Temp 98.2 F (36.8 C)   Ht 5' 9 (1.753 m)   Wt 198 lb 3.2 oz (89.9 kg)   SpO2 97%   BMI 29.27 kg/m                 Constitutional: Pt appears in NAD               HENT: Head: NCAT.                Right Ear: External ear normal.                 Left Ear: External ear normal.                Eyes: . Pupils are equal, round, and reactive to light. Conjunctivae and EOM are normal               Nose: without d/c or deformity               Neck: Neck supple. Gross normal ROM               Cardiovascular: Normal rate and regular rhythm.                 Pulmonary/Chest: Effort normal and breath sounds without rales or wheezing.                               Neurological: Pt is alert. At baseline orientation, motor grossly intact               Skin: Skin is warm. LE edema - none, low mid sternal 1  cm area raised red tender cystic lesion, non fluctuant or draining               Psychiatric: Pt behavior is normal without agitation   Micro: none  Cardiac tracings I have personally interpreted today:  none  Pertinent Radiological findings (summarize): none   Lab Results  Component Value Date   WBC 8.0 03/22/2022   HGB 12.4 03/22/2022   HCT 38.6 03/22/2022   PLT 243.0 03/22/2022   GLUCOSE 100 (H) 05/16/2023   CHOL 236 (H) 05/16/2023   TRIG 296.0 (H) 05/16/2023   HDL 55.10 05/16/2023   LDLDIRECT 98.0 08/10/2018   LDLCALC 122 (H) 05/16/2023   ALT 9 05/16/2023   AST 11 05/16/2023   NA 141 05/16/2023   K 4.2 05/16/2023   CL 107 05/16/2023   CREATININE 1.29 (H) 05/16/2023   BUN 28 (H) 05/16/2023   CO2 27 05/16/2023   TSH 0.34 (L) 05/16/2023   INR 1.35 12/23/2016   HGBA1C 6.3 05/16/2023   MICROALBUR 1.5 05/16/2023   Assessment/Plan:  Stacey [name] is a 73 y.o. Black or African American [2] female with  has a past medical history of Allergy, ANGIOEDEMA (03/07/2008), ANXIETY (09/16/2006),  ASTHMA (08/01/2006), CAD (coronary artery disease), Chronic diastolic CHF (congestive heart failure) (HCC) (04/06/2010), Complete heart block (HCC) (02/26/2014), COPD (chronic obstructive pulmonary disease) (HCC), DEPRESSION (09/16/2006), DIVERTICULOSIS, COLON, WITH HEMORRHAGE (04/06/2010), DM (diabetes mellitus) in pregnancy, delivered w/postpartum condition (08/30/2010), HYPERLIPIDEMIA (08/01/2006), HYPERTENSION (08/01/2006), Hypertr obst cardiomyop (08/01/2006), Impaired glucose tolerance (06/25/2010), LBBB (left bundle branch block) (06/17/2010), OBESITY (09/16/2006), Renal artery aneurysm (HCC), SICKLE CELL ANEMIA (08/01/2006), Type II or unspecified type diabetes mellitus without mention of complication, uncontrolled (08/30/2010), and VENTRAL HERNIA (12/07/2006).  Diabetes (HCC) Lab Results  Component Value Date   HGBA1C 6.3 05/16/2023   Stable, pt to continue current medical  treatment trulicity  1.5 mg weekly, glipizide  ER 5 mg every day, actos  45 mg qd   Essential hypertension BP Readings from Last 3 Encounters:  09/19/23 130/78  08/16/23 (!) 166/71  08/03/23 139/75   Stable, pt to continue medical treatment toprol  xl 100 every day, hct 12.5 mg every day, norvasc  10 qd   Hyperlipidemia Lab Results  Component Value Date   LDLCALC 122 (H) 05/16/2023   Uncontrolled,, pt to continue current statin lipitor 80 mg every day, fenofibrate  145 mg every day, declines other change for now, for lower chol diet   Stage 3a chronic kidney disease (HCC) Lab Results  Component Value Date   CREATININE 1.29 (H) 05/16/2023   Stable overall, cont to avoid nephrotoxins   Vitamin D  deficiency Last vitamin D  Lab Results  Component Value Date   VD25OH 23.01 (L) 05/16/2023   Low, to start oral replacement   Infected sebaceous cyst Mild to mod, for antibx course doxycycline  100 bid, refer general surgury,  to f/u any worsening symptoms or concerns  Followup: Return in about 3 months (around 12/20/2023).  Lynwood Rush, MD 09/19/2023 8:45 PM [facility]

## 2023-09-19 NOTE — Assessment & Plan Note (Signed)
 Lab Results  Component Value Date   CREATININE 1.29 (H) 05/16/2023   Stable overall, cont to avoid nephrotoxins

## 2023-09-19 NOTE — Assessment & Plan Note (Signed)
 Lab Results  Component Value Date   LDLCALC 122 (H) 05/16/2023   Uncontrolled,, pt to continue current statin lipitor 80 mg every day, fenofibrate  145 mg every day, declines other change for now, for lower chol diet

## 2023-09-19 NOTE — Assessment & Plan Note (Signed)
 Mild to mod, for antibx course doxycycline  100 bid, refer general surgury,  to f/u any worsening symptoms or concerns

## 2023-09-19 NOTE — Assessment & Plan Note (Signed)
 BP Readings from Last 3 Encounters:  09/19/23 130/78  08/16/23 (!) 166/71  08/03/23 139/75   Stable, pt to continue medical treatment toprol  xl 100 every day, hct 12.5 mg every day, norvasc  10 qd

## 2023-09-19 NOTE — Assessment & Plan Note (Signed)
 Lab Results  Component Value Date   HGBA1C 6.3 05/16/2023   Stable, pt to continue current medical treatment trulicity  1.5 mg weekly, glipizide  ER 5 mg every day, actos  45 mg qd

## 2023-09-19 NOTE — Patient Instructions (Addendum)
 Please take all new medication as prescribed - the antibiotic  Please continue all other medications as before, and refills have been done if requested.  Please have the pharmacy call with any other refills you may need.  Please keep your appointments with your specialists as you may have planned  You will be contacted regarding the referral for: general surgury for infected sebaceous cyst  Please make an Appointment to return in 3 months, or sooner if needed

## 2023-10-02 ENCOUNTER — Ambulatory Visit (INDEPENDENT_AMBULATORY_CARE_PROVIDER_SITE_OTHER): Payer: HMO

## 2023-10-02 ENCOUNTER — Ambulatory Visit: Payer: Self-pay | Admitting: Surgery

## 2023-10-02 DIAGNOSIS — L723 Sebaceous cyst: Secondary | ICD-10-CM | POA: Diagnosis not present

## 2023-10-02 DIAGNOSIS — I495 Sick sinus syndrome: Secondary | ICD-10-CM

## 2023-10-03 LAB — CUP PACEART REMOTE DEVICE CHECK
Battery Remaining Longevity: 8 mo
Battery Voltage: 2.87 V
Brady Statistic AP VP Percent: 33.85 %
Brady Statistic AP VS Percent: 0.01 %
Brady Statistic AS VP Percent: 66.07 %
Brady Statistic AS VS Percent: 0.07 %
Brady Statistic RA Percent Paced: 33.81 %
Brady Statistic RV Percent Paced: 99.8 %
Date Time Interrogation Session: 20250908170951
Implantable Lead Connection Status: 753985
Implantable Lead Connection Status: 753985
Implantable Lead Implant Date: 20160203
Implantable Lead Implant Date: 20160203
Implantable Lead Location: 753859
Implantable Lead Location: 753860
Implantable Lead Model: 5076
Implantable Lead Model: 5076
Implantable Pulse Generator Implant Date: 20160203
Lead Channel Impedance Value: 361 Ohm
Lead Channel Impedance Value: 418 Ohm
Lead Channel Impedance Value: 437 Ohm
Lead Channel Impedance Value: 437 Ohm
Lead Channel Pacing Threshold Amplitude: 0.5 V
Lead Channel Pacing Threshold Amplitude: 0.875 V
Lead Channel Pacing Threshold Pulse Width: 0.4 ms
Lead Channel Pacing Threshold Pulse Width: 0.4 ms
Lead Channel Sensing Intrinsic Amplitude: 2.75 mV
Lead Channel Sensing Intrinsic Amplitude: 2.75 mV
Lead Channel Sensing Intrinsic Amplitude: 23.125 mV
Lead Channel Sensing Intrinsic Amplitude: 23.125 mV
Lead Channel Setting Pacing Amplitude: 2 V
Lead Channel Setting Pacing Amplitude: 2.5 V
Lead Channel Setting Pacing Pulse Width: 0.4 ms
Lead Channel Setting Sensing Sensitivity: 2 mV
Zone Setting Status: 755011
Zone Setting Status: 755011

## 2023-10-09 DIAGNOSIS — L723 Sebaceous cyst: Secondary | ICD-10-CM | POA: Diagnosis not present

## 2023-10-12 NOTE — Progress Notes (Signed)
 Remote PPM Transmission

## 2023-10-21 ENCOUNTER — Ambulatory Visit: Payer: Self-pay | Admitting: Cardiology

## 2023-12-20 ENCOUNTER — Encounter: Payer: Self-pay | Admitting: Pharmacist

## 2023-12-20 NOTE — Progress Notes (Signed)
 Pharmacy Quality Measure Review  This patient is appearing on a report for being at risk of failing the adherence measure for cholesterol (statin) medications this calendar year.   Medication: Atorvastatin  Last fill date: 11/29/23 for 90 day supply  Insurance report was not up to date. No action needed at this time.   Darrelyn Drum, PharmD, BCPS, CPP Clinical Pharmacist Practitioner [facility] [phone]

## 2024-01-01 ENCOUNTER — Ambulatory Visit: Payer: HMO

## 2024-01-05 LAB — CUP PACEART REMOTE DEVICE CHECK
Battery Remaining Longevity: 4 mo
Battery Voltage: 2.85 V
Brady Statistic AP VP Percent: 29.87 %
Brady Statistic AP VS Percent: 0.01 %
Brady Statistic AS VP Percent: 69.82 %
Brady Statistic AS VS Percent: 0.3 %
Brady Statistic RA Percent Paced: 29.77 %
Brady Statistic RV Percent Paced: 99.4 %
Date Time Interrogation Session: 20251211160757
Implantable Lead Connection Status: 753985
Implantable Lead Connection Status: 753985
Implantable Lead Implant Date: 20160203
Implantable Lead Implant Date: 20160203
Implantable Lead Location: 753859
Implantable Lead Location: 753860
Implantable Lead Model: 5076
Implantable Lead Model: 5076
Implantable Pulse Generator Implant Date: 20160203
Lead Channel Impedance Value: 342 Ohm
Lead Channel Impedance Value: 418 Ohm
Lead Channel Impedance Value: 456 Ohm
Lead Channel Impedance Value: 475 Ohm
Lead Channel Pacing Threshold Amplitude: 0.5 V
Lead Channel Pacing Threshold Amplitude: 1 V
Lead Channel Pacing Threshold Pulse Width: 0.4 ms
Lead Channel Pacing Threshold Pulse Width: 0.4 ms
Lead Channel Sensing Intrinsic Amplitude: 2.25 mV
Lead Channel Sensing Intrinsic Amplitude: 2.25 mV
Lead Channel Sensing Intrinsic Amplitude: 9.75 mV
Lead Channel Sensing Intrinsic Amplitude: 9.75 mV
Lead Channel Setting Pacing Amplitude: 2 V
Lead Channel Setting Pacing Amplitude: 2.5 V
Lead Channel Setting Pacing Pulse Width: 0.4 ms
Lead Channel Setting Sensing Sensitivity: 2 mV
Zone Setting Status: 755011
Zone Setting Status: 755011

## 2024-01-09 NOTE — Progress Notes (Signed)
 Remote PPM Transmission

## 2024-01-12 ENCOUNTER — Ambulatory Visit: Payer: Self-pay | Admitting: Cardiology

## 2024-02-01 ENCOUNTER — Ambulatory Visit: Payer: Self-pay | Attending: Internal Medicine

## 2024-02-06 LAB — CUP PACEART REMOTE DEVICE CHECK
Battery Remaining Longevity: 4 mo
Battery Voltage: 2.85 V
Brady Statistic AP VP Percent: 32.64 %
Brady Statistic AP VS Percent: 0 %
Brady Statistic AS VP Percent: 67.12 %
Brady Statistic AS VS Percent: 0.24 %
Brady Statistic RA Percent Paced: 32.56 %
Brady Statistic RV Percent Paced: 99.56 %
Date Time Interrogation Session: 20260113124854
Implantable Lead Connection Status: 753985
Implantable Lead Connection Status: 753985
Implantable Lead Implant Date: 20160203
Implantable Lead Implant Date: 20160203
Implantable Lead Location: 753859
Implantable Lead Location: 753860
Implantable Lead Model: 5076
Implantable Lead Model: 5076
Implantable Pulse Generator Implant Date: 20160203
Lead Channel Impedance Value: 342 Ohm
Lead Channel Impedance Value: 456 Ohm
Lead Channel Impedance Value: 475 Ohm
Lead Channel Impedance Value: 513 Ohm
Lead Channel Pacing Threshold Amplitude: 0.5 V
Lead Channel Pacing Threshold Amplitude: 0.875 V
Lead Channel Pacing Threshold Pulse Width: 0.4 ms
Lead Channel Pacing Threshold Pulse Width: 0.4 ms
Lead Channel Sensing Intrinsic Amplitude: 13.375 mV
Lead Channel Sensing Intrinsic Amplitude: 13.375 mV
Lead Channel Sensing Intrinsic Amplitude: 2.75 mV
Lead Channel Sensing Intrinsic Amplitude: 2.75 mV
Lead Channel Setting Pacing Amplitude: 2 V
Lead Channel Setting Pacing Amplitude: 2.5 V
Lead Channel Setting Pacing Pulse Width: 0.4 ms
Lead Channel Setting Sensing Sensitivity: 2 mV
Zone Setting Status: 755011
Zone Setting Status: 755011

## 2024-02-11 ENCOUNTER — Ambulatory Visit: Payer: Self-pay | Admitting: Cardiology

## 2024-02-15 ENCOUNTER — Encounter: Payer: Self-pay | Admitting: *Deleted

## 2024-02-15 NOTE — Progress Notes (Signed)
 Stacey [name]                                          MRN: [id number]   02/15/2024   The [facility] Quality Team Specialist reviewed this patient medical record for the purposes of chart review for care gap closure. The following were reviewed: abstraction for care gap closure-kidney health evaluation for diabetes:eGFR  and uACR.    [facility] Quality Team

## 2024-02-16 ENCOUNTER — Telehealth: Payer: Self-pay | Admitting: Internal Medicine

## 2024-02-16 NOTE — Telephone Encounter (Signed)
 Copied from CRM #[id number]. Topic: General - Other >> Feb 16, 2024  1:24 PM Sophia H wrote: Reason for CRM: Patient is requesting to speak with Dr. Norleen - states she would like to clarify a few things and just make final contact before he leaves. Please reach out # [phone]

## 2024-02-19 NOTE — Telephone Encounter (Signed)
 Oh no sorry, I am unable to do patient phone calls, but if there is any particular question, we can likely answer that.   Thanks!

## 2024-03-03 ENCOUNTER — Ambulatory Visit: Payer: Self-pay

## 2024-04-03 ENCOUNTER — Ambulatory Visit: Payer: Self-pay

## 2024-05-04 ENCOUNTER — Ambulatory Visit: Payer: Self-pay

## 2024-06-04 ENCOUNTER — Ambulatory Visit: Payer: Self-pay

## 2024-06-21 ENCOUNTER — Ambulatory Visit

## 2024-07-05 ENCOUNTER — Ambulatory Visit: Payer: Self-pay
